# Patient Record
Sex: Female | Born: 1935 | Race: White | Hispanic: No | Smoking: Former smoker
Health system: Southern US, Community
[De-identification: ages and names within clinical notes are randomized; demographics above are authoritative.]

## PROBLEM LIST (undated history)

## (undated) DIAGNOSIS — Z9010 Acquired absence of unspecified breast and nipple: Secondary | ICD-10-CM

## (undated) DIAGNOSIS — H3530 Unspecified macular degeneration: Secondary | ICD-10-CM

## (undated) DIAGNOSIS — J45909 Unspecified asthma, uncomplicated: Secondary | ICD-10-CM

## (undated) DIAGNOSIS — N393 Stress incontinence (female) (male): Secondary | ICD-10-CM

## (undated) DIAGNOSIS — I272 Pulmonary hypertension, unspecified: Secondary | ICD-10-CM

## (undated) DIAGNOSIS — I509 Heart failure, unspecified: Secondary | ICD-10-CM

## (undated) DIAGNOSIS — M1990 Unspecified osteoarthritis, unspecified site: Secondary | ICD-10-CM

## (undated) DIAGNOSIS — Z803 Family history of malignant neoplasm of breast: Secondary | ICD-10-CM

## (undated) DIAGNOSIS — I10 Essential (primary) hypertension: Secondary | ICD-10-CM

## (undated) DIAGNOSIS — Z8701 Personal history of pneumonia (recurrent): Secondary | ICD-10-CM

## (undated) DIAGNOSIS — I700 Atherosclerosis of aorta: Secondary | ICD-10-CM

## (undated) DIAGNOSIS — N2889 Other specified disorders of kidney and ureter: Secondary | ICD-10-CM

## (undated) DIAGNOSIS — Z9089 Acquired absence of other organs: Secondary | ICD-10-CM

## (undated) DIAGNOSIS — C50919 Malignant neoplasm of unspecified site of unspecified female breast: Secondary | ICD-10-CM

## (undated) DIAGNOSIS — R0609 Other forms of dyspnea: Secondary | ICD-10-CM

## (undated) DIAGNOSIS — I2510 Atherosclerotic heart disease of native coronary artery without angina pectoris: Secondary | ICD-10-CM

## (undated) DIAGNOSIS — Z8619 Personal history of other infectious and parasitic diseases: Secondary | ICD-10-CM

## (undated) DIAGNOSIS — L570 Actinic keratosis: Secondary | ICD-10-CM

## (undated) DIAGNOSIS — J479 Bronchiectasis, uncomplicated: Secondary | ICD-10-CM

## (undated) DIAGNOSIS — K623 Rectal prolapse: Secondary | ICD-10-CM

## (undated) DIAGNOSIS — E039 Hypothyroidism, unspecified: Secondary | ICD-10-CM

## (undated) DIAGNOSIS — E890 Postprocedural hypothyroidism: Secondary | ICD-10-CM

## (undated) DIAGNOSIS — N3941 Urge incontinence: Secondary | ICD-10-CM

## (undated) DIAGNOSIS — Z853 Personal history of malignant neoplasm of breast: Secondary | ICD-10-CM

## (undated) DIAGNOSIS — I219 Acute myocardial infarction, unspecified: Secondary | ICD-10-CM

## (undated) DIAGNOSIS — J189 Pneumonia, unspecified organism: Secondary | ICD-10-CM

## (undated) DIAGNOSIS — Z7901 Long term (current) use of anticoagulants: Secondary | ICD-10-CM

## (undated) DIAGNOSIS — I517 Cardiomegaly: Secondary | ICD-10-CM

## (undated) DIAGNOSIS — Z8489 Family history of other specified conditions: Secondary | ICD-10-CM

## (undated) DIAGNOSIS — E785 Hyperlipidemia, unspecified: Secondary | ICD-10-CM

## (undated) HISTORY — PX: ABDOMINAL HYSTERECTOMY: SUR658

## (undated) HISTORY — PX: BREAST LUMPECTOMY: SHX2

## (undated) HISTORY — PX: CHOLECYSTECTOMY: SHX55

## (undated) HISTORY — PX: TONSILLECTOMY AND ADENOIDECTOMY: SHX28

## (undated) HISTORY — DX: Family history of malignant neoplasm of breast: Z80.3

## (undated) HISTORY — PX: CATARACT EXTRACTION, BILATERAL: SHX1313

## (undated) HISTORY — DX: Unspecified macular degeneration: H35.30

## (undated) HISTORY — DX: Essential (primary) hypertension: I10

## (undated) HISTORY — PX: TONSILLECTOMY: SUR1361

## (undated) HISTORY — PX: MASTECTOMY: SHX3

## (undated) HISTORY — DX: Personal history of malignant neoplasm of breast: Z85.3

## (undated) HISTORY — PX: REPLACEMENT TOTAL KNEE: SUR1224

## (undated) HISTORY — DX: Hypothyroidism, unspecified: E03.9

## (undated) HISTORY — PX: ABDOMINAL HYSTERECTOMY: SHX81

## (undated) HISTORY — PX: CARDIAC VALVE REPLACEMENT: SHX585

## (undated) HISTORY — DX: Personal history of pneumonia (recurrent): Z87.01

## (undated) HISTORY — PX: APPENDECTOMY: SHX54

---
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
## 2011-08-08 ENCOUNTER — Ambulatory Visit: Payer: Self-pay | Admitting: Internal Medicine

## 2012-05-18 ENCOUNTER — Emergency Department: Payer: Self-pay | Admitting: Internal Medicine

## 2014-01-19 DIAGNOSIS — D229 Melanocytic nevi, unspecified: Secondary | ICD-10-CM

## 2014-01-19 HISTORY — DX: Melanocytic nevi, unspecified: D22.9

## 2014-04-14 ENCOUNTER — Ambulatory Visit: Payer: Self-pay | Admitting: Ophthalmology

## 2014-06-09 ENCOUNTER — Encounter (INDEPENDENT_AMBULATORY_CARE_PROVIDER_SITE_OTHER): Payer: Medicare Other | Admitting: Ophthalmology

## 2014-06-09 DIAGNOSIS — H35039 Hypertensive retinopathy, unspecified eye: Secondary | ICD-10-CM

## 2014-06-09 DIAGNOSIS — H3530 Unspecified macular degeneration: Secondary | ICD-10-CM

## 2014-06-09 DIAGNOSIS — H35379 Puckering of macula, unspecified eye: Secondary | ICD-10-CM

## 2014-06-09 DIAGNOSIS — I10 Essential (primary) hypertension: Secondary | ICD-10-CM

## 2014-06-09 DIAGNOSIS — H43819 Vitreous degeneration, unspecified eye: Secondary | ICD-10-CM

## 2014-07-22 DIAGNOSIS — C4491 Basal cell carcinoma of skin, unspecified: Secondary | ICD-10-CM

## 2014-07-22 HISTORY — DX: Basal cell carcinoma of skin, unspecified: C44.91

## 2015-03-10 ENCOUNTER — Ambulatory Visit (INDEPENDENT_AMBULATORY_CARE_PROVIDER_SITE_OTHER): Payer: Medicare Other | Admitting: Ophthalmology

## 2015-03-10 DIAGNOSIS — H35033 Hypertensive retinopathy, bilateral: Secondary | ICD-10-CM

## 2015-03-10 DIAGNOSIS — I10 Essential (primary) hypertension: Secondary | ICD-10-CM

## 2015-03-10 DIAGNOSIS — H43813 Vitreous degeneration, bilateral: Secondary | ICD-10-CM | POA: Diagnosis not present

## 2015-03-10 DIAGNOSIS — H35372 Puckering of macula, left eye: Secondary | ICD-10-CM | POA: Diagnosis not present

## 2015-03-10 DIAGNOSIS — H3531 Nonexudative age-related macular degeneration: Secondary | ICD-10-CM | POA: Diagnosis not present

## 2015-08-10 ENCOUNTER — Ambulatory Visit (INDEPENDENT_AMBULATORY_CARE_PROVIDER_SITE_OTHER): Payer: Medicare Other | Admitting: Podiatry

## 2015-08-10 ENCOUNTER — Encounter: Payer: Self-pay | Admitting: Podiatry

## 2015-08-10 DIAGNOSIS — M778 Other enthesopathies, not elsewhere classified: Secondary | ICD-10-CM

## 2015-08-10 DIAGNOSIS — M7750 Other enthesopathy of unspecified foot: Secondary | ICD-10-CM | POA: Diagnosis not present

## 2015-08-10 DIAGNOSIS — L84 Corns and callosities: Secondary | ICD-10-CM

## 2015-08-10 DIAGNOSIS — M779 Enthesopathy, unspecified: Principal | ICD-10-CM

## 2015-08-10 NOTE — Progress Notes (Signed)
   Subjective:    [name], female    DOB: 1930/01/14, 81 y.o.   MRN: [id number]  HPI my 5th toe on my right is hurting and is touchy and hurts on my left 5th toe but not as bad and i have some white stuff on my toenails as well. She states that she keeps her nails polished most of the time. When she took the collar off this past time she noticed quite on top of the nails. She states that she has tried wider shoes for her corns.    Review of Systems     Objective:   Physical Exam: 81 year old female presents today in no apparent distress vital signs stable alert and oriented 3. Pulses are palpable. Neurologic sensorium is intact per Semmes-Weinstein monofilament. Deep tendon reflexes intact bilateral and muscle strength +5 over 5 dorsiflexion and plantar flexors and inverters everters all intrinsic musculature is intact. Orthopedic evaluation demonstrate all joints distal to the ankle range of motion without crepitation. Cutaneous evaluation demonstrates supple well-hydrated cutis no erythema edema saline as drainage or odor. She does have hammertoe deformities bilaterally not rigid in nature adductovarus rotated hammertoe deformity fifth bilateral with overlying bursitis and reactive hyperkeratosis. No complaints demonstrate normal nail plate with whitish discoloration associated with nail polish.        Assessment & Plan:  Assessment: Adductor varus rotated hammertoe deformity with capsulitis and bursitis.  Plan: I injected the area today with dexamethasone and local anesthetic and debrided all reactive hyperkeratosis. I discussed the nail discoloration with her she understands this and is amenable to it. I will follow up with her as needed.  Roselind Messier DPM

## 2016-03-13 ENCOUNTER — Ambulatory Visit (INDEPENDENT_AMBULATORY_CARE_PROVIDER_SITE_OTHER): Payer: Medicare Other | Admitting: Ophthalmology

## 2016-03-13 DIAGNOSIS — I10 Essential (primary) hypertension: Secondary | ICD-10-CM | POA: Diagnosis not present

## 2016-03-13 DIAGNOSIS — H35033 Hypertensive retinopathy, bilateral: Secondary | ICD-10-CM

## 2016-03-13 DIAGNOSIS — H353111 Nonexudative age-related macular degeneration, right eye, early dry stage: Secondary | ICD-10-CM

## 2016-03-13 DIAGNOSIS — H43813 Vitreous degeneration, bilateral: Secondary | ICD-10-CM

## 2016-03-13 DIAGNOSIS — H35372 Puckering of macula, left eye: Secondary | ICD-10-CM

## 2016-03-13 DIAGNOSIS — H353122 Nonexudative age-related macular degeneration, left eye, intermediate dry stage: Secondary | ICD-10-CM

## 2016-09-26 ENCOUNTER — Ambulatory Visit (INDEPENDENT_AMBULATORY_CARE_PROVIDER_SITE_OTHER): Payer: Medicare Other | Admitting: Podiatry

## 2016-09-26 DIAGNOSIS — L84 Corns and callosities: Secondary | ICD-10-CM

## 2016-09-26 DIAGNOSIS — M7750 Other enthesopathy of unspecified foot: Secondary | ICD-10-CM | POA: Diagnosis not present

## 2016-09-26 DIAGNOSIS — M779 Enthesopathy, unspecified: Secondary | ICD-10-CM

## 2016-09-26 DIAGNOSIS — M778 Other enthesopathies, not elsewhere classified: Secondary | ICD-10-CM

## 2016-09-27 NOTE — Progress Notes (Signed)
She presents today for follow-up of her painful corn fifth digit bilateral.  Objective: Vital signs are stable she is alert and oriented 3 she has capsulitis and bursitis to this fifth digit bilateral with overlying reactive hyperkeratosis. Pulses remain palpable.  Assessment: Capsulitis bursitis that digit bilateral overlying reactive hyperkeratosis.  Plan: Injected the area today with a smooth zone of local anesthetic debrided the cords bilaterally. We'll follow up with her in 3 months.

## 2017-03-14 ENCOUNTER — Ambulatory Visit (INDEPENDENT_AMBULATORY_CARE_PROVIDER_SITE_OTHER): Payer: Medicare Other | Admitting: Ophthalmology

## 2017-03-14 DIAGNOSIS — H35033 Hypertensive retinopathy, bilateral: Secondary | ICD-10-CM

## 2017-03-14 DIAGNOSIS — H26492 Other secondary cataract, left eye: Secondary | ICD-10-CM | POA: Diagnosis not present

## 2017-03-14 DIAGNOSIS — H35372 Puckering of macula, left eye: Secondary | ICD-10-CM | POA: Diagnosis not present

## 2017-03-14 DIAGNOSIS — I10 Essential (primary) hypertension: Secondary | ICD-10-CM | POA: Diagnosis not present

## 2017-03-14 DIAGNOSIS — H43813 Vitreous degeneration, bilateral: Secondary | ICD-10-CM | POA: Diagnosis not present

## 2017-03-21 ENCOUNTER — Ambulatory Visit (INDEPENDENT_AMBULATORY_CARE_PROVIDER_SITE_OTHER): Payer: Medicare Other | Admitting: Ophthalmology

## 2017-03-21 DIAGNOSIS — H2702 Aphakia, left eye: Secondary | ICD-10-CM

## 2017-07-08 DIAGNOSIS — C4492 Squamous cell carcinoma of skin, unspecified: Secondary | ICD-10-CM

## 2017-07-08 HISTORY — DX: Squamous cell carcinoma of skin, unspecified: C44.92

## 2017-08-14 ENCOUNTER — Telehealth: Payer: Self-pay | Admitting: *Deleted

## 2017-08-14 ENCOUNTER — Telehealth: Payer: Self-pay

## 2017-08-14 NOTE — Telephone Encounter (Signed)
Copied from [place] #[id number]. Topic: Appointment Scheduling - Prior Auth Required for Appointment >> Aug 14, 2017  8:17 AM Aurelio Brash B wrote: No appointment has been scheduled. Patient is requesting  new  pt apt appointment. Per scheduling protocol, this appointment requires a prior authorization prior to scheduling. Dr. Nehemiah Massed  recommended her to Dr Nicki Reaper  Route to department's [facility] pool.  >> Aug 14, 2017  3:30 PM Cleaster Corin, [place] wrote: Pt. Has called back trying to get in to see Dr. Nicki Reaper I let her know that someone will call her back when available [name]

## 2017-08-14 NOTE — Telephone Encounter (Signed)
Ok to schedule.

## 2017-08-14 NOTE — Telephone Encounter (Signed)
Patient is requesting to establish care with you. She says Dr. Nehemiah Massed recommended you  Copied from [place] [id number]. Topic: Appointment Scheduling - Prior Auth Required for Appointment >> Aug 14, 2017  8:17 AM Aurelio Brash B wrote: No appointment has been scheduled. Patient is requesting  new  pt apt appointment. Per scheduling protocol, this appointment requires a prior authorization prior to scheduling. Dr. Nehemiah Massed  recommended her to Dr Nicki Reaper  Route to department's [facility] pool.

## 2017-08-15 NOTE — Telephone Encounter (Signed)
Pt scheduled  

## 2017-08-15 NOTE — Telephone Encounter (Signed)
See other message regarding this phone note is open already.

## 2017-08-19 NOTE — Telephone Encounter (Signed)
Copied from [place] #[id number]. Topic: Appointment Scheduling - Prior Auth Required for Appointment >> Aug 14, 2017  8:17 AM Aurelio Brash B wrote: No appointment has been scheduled. Patient is requesting  new  pt apt appointment. Per scheduling protocol, this appointment requires a prior authorization prior to scheduling. Dr. Nehemiah Massed  recommended her to Dr Nicki Reaper  Route to department's [facility] pool.  >> Aug 14, 2017  3:30 PM Cleaster Corin, [place] wrote: Pt. Has called back trying to get in to see Dr. Nicki Reaper I let her know that someone will call her back when available [name]   >> Aug 19, 2017  1:45 PM Francella Solian, CMA wrote: App has been made patient informed.

## 2017-11-28 ENCOUNTER — Ambulatory Visit (INDEPENDENT_AMBULATORY_CARE_PROVIDER_SITE_OTHER): Payer: Medicare Other | Admitting: Internal Medicine

## 2017-11-28 DIAGNOSIS — H3530 Unspecified macular degeneration: Secondary | ICD-10-CM

## 2017-11-28 DIAGNOSIS — E7800 Pure hypercholesterolemia, unspecified: Secondary | ICD-10-CM

## 2017-11-28 DIAGNOSIS — E039 Hypothyroidism, unspecified: Secondary | ICD-10-CM

## 2017-11-28 DIAGNOSIS — I10 Essential (primary) hypertension: Secondary | ICD-10-CM | POA: Insufficient documentation

## 2017-11-28 DIAGNOSIS — Z853 Personal history of malignant neoplasm of breast: Secondary | ICD-10-CM | POA: Diagnosis not present

## 2017-11-28 DIAGNOSIS — E785 Hyperlipidemia, unspecified: Secondary | ICD-10-CM | POA: Insufficient documentation

## 2017-11-28 LAB — LIPID PANEL
CHOL/HDL RATIO: 3
Cholesterol: 199 mg/dL (ref 0–200)
HDL: 67.8 mg/dL (ref >39.00)
LDL Cholesterol: 117 mg/dL — ABNORMAL HIGH (ref 0–99)
NONHDL: 131.32
Triglycerides: 70 mg/dL (ref 0.0–149.0)
VLDL: 14 mg/dL (ref 0.0–40.0)

## 2017-11-28 LAB — HEPATIC FUNCTION PANEL
ALK PHOS: 63 U/L (ref 39–117)
ALT: 16 U/L (ref 0–35)
AST: 28 U/L (ref 0–37)
Albumin: 4 g/dL (ref 3.5–5.2)
BILIRUBIN DIRECT: 0.1 mg/dL (ref 0.0–0.3)
Total Bilirubin: 0.7 mg/dL (ref 0.2–1.2)
Total Protein: 7 g/dL (ref 6.0–8.3)

## 2017-11-28 LAB — TSH: TSH: 2.65 u[IU]/mL (ref 0.35–4.50)

## 2017-11-28 LAB — BASIC METABOLIC PANEL
BUN: 25 mg/dL — AB (ref 6–23)
CALCIUM: 9.7 mg/dL (ref 8.4–10.5)
CO2: 34 mEq/L — ABNORMAL HIGH (ref 19–32)
CREATININE: 0.99 mg/dL (ref 0.40–1.20)
Chloride: 101 mEq/L (ref 96–112)
GFR: 56.51 mL/min — AB (ref >60.00)
Glucose, Bld: 92 mg/dL (ref 70–99)
POTASSIUM: 3.9 meq/L (ref 3.5–5.1)
Sodium: 142 mEq/L (ref 135–145)

## 2017-11-28 NOTE — Progress Notes (Signed)
[name], female   DOB: 04-25-1930, 84 y.o.   MRN: [id number]   Subjective:    [name], female    DOB: [birth year], 84 y.o.   MRN: [id number]  HPI  Patient here to establish care.  Former pt of Dr Alexa Pham. He retired.  She reports she is doing well.  States she is married and has been for 37 years.  Originally from [place].  Has lived in multiple places.  Has been her for 6 years.  Stays active.  Walks 2-3 miles several times per week.  Used to play tennis.  No chest pain.  No sob.  No acid reflux.  No abdominal pain.  Bowels moving.  Eats bran and this keeps her regular.  Is s/p lumpectomy for breast cancer.  Did not require chemo.  Declines further mammograms.  Followed by [facility].  Has macular degeneration.  On synthroid.  Just had her dose adjusted in 05/2018.  Has not had labs since.     Past Medical History:  Diagnosis Date  . History of breast cancer   . Hypertension   . Hypothyroidism   . Macular degeneration       Social History   Socioeconomic History  . Marital status: Married    Spouse name: None  . Number of children: None  . Years of education: None  . Highest education level: None  Social Needs  . Financial resource strain: None  . Food insecurity - worry: None  . Food insecurity - inability: None  . Transportation needs - medical: None  . Transportation needs - non-medical: None  Occupational History  . None  Tobacco Use  . Smoking status: Never Smoker  . Smokeless tobacco: Never Used  Substance and Sexual Activity  . Alcohol use: No    Alcohol/week: 0.0 oz  . Drug use: No  . Sexual activity: None  Other Topics Concern  . None  Social History Narrative  . None    Outpatient Encounter Medications as of 11/28/2017  Medication Sig  . amLODipine (NORVASC) 10 MG tablet Take 10 mg by mouth daily.  . calcium citrate-vitamin D (CITRACAL+D) 315-200 MG-UNIT tablet Take 1 tablet by mouth 2 (two) times daily.  Marland Kitchen  levothyroxine (SYNTHROID, LEVOTHROID) 112 MCG tablet   . metoprolol succinate (TOPROL-XL) 50 MG 24 hr tablet Take 50 mg by mouth daily. Take with or immediately following a meal.  . mirabegron ER (MYRBETRIQ) 25 MG TB24 tablet Take 25 mg by mouth daily.  . Multiple Vitamin (MULTIVITAMIN) capsule Take 1 capsule by mouth daily.  . Multiple Vitamins-Minerals (PRESERVISION AREDS 2 PO) Take by mouth.  . [DISCONTINUED] aspirin 81 MG tablet Take 81 mg by mouth daily.  . [DISCONTINUED] Glucosamine-Chondroitin (OSTEO BI-FLEX REGULAR STRENGTH PO) Take by mouth.  . [DISCONTINUED] levothyroxine (SYNTHROID, LEVOTHROID) 100 MCG tablet Take 100 mcg by mouth daily before breakfast.   No facility-administered encounter medications on file as of 11/28/2017.     Review of Systems  Constitutional: Negative for appetite change and unexpected weight change.  HENT: Negative for congestion and sinus pressure.   Respiratory: Negative for cough, chest tightness and shortness of breath.   Cardiovascular: Negative for chest pain, palpitations and leg swelling.  Gastrointestinal: Negative for abdominal pain, diarrhea, nausea and vomiting.  Genitourinary: Negative for dysuria.  Musculoskeletal: Negative for joint swelling and myalgias.  Skin: Negative for color change and rash.  Neurological: Negative for dizziness, light-headedness and headaches.  Psychiatric/Behavioral: Negative for  agitation and dysphoric mood.       Objective:    Physical Exam  Constitutional: She appears well-developed and well-nourished. No distress.  HENT:  Nose: Nose normal.  Mouth/Throat: Oropharynx is clear and moist.  Neck: Neck supple. No thyromegaly present.  Cardiovascular: Normal rate and regular rhythm.  Pulmonary/Chest: Breath sounds normal. No respiratory distress. She has no wheezes.  Abdominal: Soft. Bowel sounds are normal. There is no tenderness.  Musculoskeletal: She exhibits no edema or tenderness.  Lymphadenopathy:     She has no cervical adenopathy.  Skin: No rash noted. No erythema.  Psychiatric: She has a normal mood and affect. Her behavior is normal.    BP 128/70 (BP Location: Left Arm, Patient Position: Sitting, Cuff Size: Normal)   Pulse 62   Temp 98.3 F (36.8 C) (Oral)   Resp 18   Wt 115 lb 3.2 oz (52.3 kg)   SpO2 97%  Wt Readings from Last 3 Encounters:  11/28/17 115 lb 3.2 oz (52.3 kg)       Assessment & Plan:   Problem List Items Addressed This Visit    History of breast cancer    S/p lumpectomy.  Did not require chemo.  Declines mammogram.        Hypercholesterolemia    Follow lipid panel.  Has never been on cholesterol medication.       Relevant Orders   Hepatic function panel (Completed)   Lipid panel (Completed)   Hypertension    Blood pressure as outlined.  Increased on my initial check, but improved on recheck.  Same medication regimen.  Follow pressures.  Check metabolic panel.        Relevant Orders   Basic metabolic panel (Completed)   Hypothyroidism    On thyroid replacement.  Dose just changed 05/2017.  Check tsh.        Relevant Medications   levothyroxine (SYNTHROID, LEVOTHROID) 112 MCG tablet   Other Relevant Orders   TSH (Completed)   Macular degeneration    Followed by ophthalmology.           I spent 45 minutes with the patient and more than 50% of the time was spent in consultation regarding the above.  Time spent obtaining her history and discussing current issues.  Time also spent discussed treatment and further testing.    Einar Pheasant, MD

## 2017-12-01 ENCOUNTER — Encounter: Payer: Self-pay | Admitting: Internal Medicine

## 2017-12-01 DIAGNOSIS — H3530 Unspecified macular degeneration: Secondary | ICD-10-CM | POA: Insufficient documentation

## 2017-12-01 DIAGNOSIS — Z853 Personal history of malignant neoplasm of breast: Secondary | ICD-10-CM | POA: Insufficient documentation

## 2017-12-01 NOTE — Assessment & Plan Note (Signed)
S/p lumpectomy.  Did not require chemo.  Declines mammogram.

## 2017-12-01 NOTE — Assessment & Plan Note (Signed)
Followed by ophthalmology.   

## 2017-12-01 NOTE — Assessment & Plan Note (Signed)
Follow lipid panel.  Has never been on cholesterol medication.

## 2017-12-01 NOTE — Assessment & Plan Note (Signed)
On thyroid replacement.  Dose just changed 05/2017.  Check tsh.

## 2017-12-01 NOTE — Assessment & Plan Note (Signed)
Blood pressure as outlined.  Increased on my initial check, but improved on recheck.  Same medication regimen.  Follow pressures.  Check metabolic panel.

## 2017-12-31 ENCOUNTER — Encounter: Payer: Self-pay | Admitting: *Deleted

## 2018-02-06 ENCOUNTER — Other Ambulatory Visit: Payer: Self-pay

## 2018-02-06 ENCOUNTER — Telehealth: Payer: Self-pay

## 2018-02-06 MED ORDER — METOPROLOL SUCCINATE ER 50 MG PO TB24: 50.0000 mg | ORAL_TABLET | Freq: Every day | ORAL | 0 refills | Status: DC

## 2018-02-06 MED ORDER — LEVOTHYROXINE SODIUM 112 MCG PO TABS: 112.0000 ug | ORAL_TABLET | Freq: Every day | ORAL | 1 refills | Status: DC

## 2018-02-06 MED ORDER — AMLODIPINE BESYLATE 10 MG PO TABS: 10.0000 mg | ORAL_TABLET | Freq: Every day | ORAL | 1 refills | Status: DC

## 2018-02-06 NOTE — Telephone Encounter (Signed)
Copied from [place] [id number]. Topic: Quick Communication - Rx Refill/Question >> Feb 06, 2018  1:14 PM Carolyn Stare wrote: Medication  amLODipine (NORVASC) 10 MG tablet    levothyroxine (SYNTHROID, LEVOTHROID) 112 MCG tablet    [place] 50mg    Pt is a new pt of Dr Nicki Reaper and is requesting refills on the above meds    Preferred Pharmacy   [facility] at   [place]: Please be advised that RX refills may take up to 3 business days. We ask that you follow-up with your pharmacy.

## 2018-02-06 NOTE — Telephone Encounter (Signed)
Pt aware rx sent in. 

## 2018-02-06 NOTE — Telephone Encounter (Signed)
This is a new patient of yours. OK to send in these refills for 90 days ?

## 2018-02-06 NOTE — Telephone Encounter (Signed)
ok 

## 2018-03-06 ENCOUNTER — Encounter: Payer: Self-pay | Admitting: Internal Medicine

## 2018-03-06 ENCOUNTER — Ambulatory Visit (INDEPENDENT_AMBULATORY_CARE_PROVIDER_SITE_OTHER): Payer: Medicare Other | Admitting: Internal Medicine

## 2018-03-06 DIAGNOSIS — Z853 Personal history of malignant neoplasm of breast: Secondary | ICD-10-CM

## 2018-03-06 DIAGNOSIS — E039 Hypothyroidism, unspecified: Secondary | ICD-10-CM | POA: Diagnosis not present

## 2018-03-06 DIAGNOSIS — I10 Essential (primary) hypertension: Secondary | ICD-10-CM | POA: Diagnosis not present

## 2018-03-06 DIAGNOSIS — E7800 Pure hypercholesterolemia, unspecified: Secondary | ICD-10-CM | POA: Diagnosis not present

## 2018-03-06 NOTE — Progress Notes (Signed)
[name], female   DOB: 1929-12-16, 84 y.o.   MRN: [id number]   Subjective:    [name], female    DOB: 1930/08/01, 84 y.o.   MRN: [id number]  HPI  Patient here for a scheduled follow up.  Sees Dr Jacqlyn Larsen.  Stable on myrbetriq.  New pt to me last visit.  Has been doing well.  Stays active.  No chest pain.  No sob.  No acid reflux.  Good appetite.  Bowels stable.  Overall feels good.  Has been monitoring her blood pressure.  Overall ok.  Outside checks reviewed.     Past Medical History:  Diagnosis Date  . History of breast cancer   . Hypertension   . Hypothyroidism   . Macular degeneration    Past Surgical History:  Procedure Laterality Date  . ABDOMINAL HYSTERECTOMY    . APPENDECTOMY    . BREAST LUMPECTOMY    . CATARACT EXTRACTION, BILATERAL    . CHOLECYSTECTOMY    . REPLACEMENT TOTAL KNEE    . TONSILLECTOMY AND ADENOIDECTOMY     Family History  Problem Relation Age of Onset  . CAD Mother    Social History   Socioeconomic History  . Marital status: Married    Spouse name: Not on file  . Number of children: Not on file  . Years of education: Not on file  . Highest education level: Not on file  Occupational History  . Not on file  Social Needs  . Financial resource strain: Not on file  . Food insecurity:    Worry: Not on file    Inability: Not on file  . Transportation needs:    Medical: Not on file    Non-medical: Not on file  Tobacco Use  . Smoking status: Never Smoker  . Smokeless tobacco: Never Used  Substance and Sexual Activity  . Alcohol use: No    Alcohol/week: 0.0 oz  . Drug use: No  . Sexual activity: Not on file  Lifestyle  . Physical activity:    Days per week: Not on file    Minutes per session: Not on file  . Stress: Not on file  Relationships  . Social connections:    Talks on phone: Not on file    Gets together: Not on file    Attends religious service: Not on file    Active member of club or organization:  Not on file    Attends meetings of clubs or organizations: Not on file    Relationship status: Not on file  Other Topics Concern  . Not on file  Social History Narrative  . Not on file    Outpatient Encounter Medications as of 03/06/2018  Medication Sig  . amLODipine (NORVASC) 10 MG tablet Take 1 tablet (10 mg total) by mouth daily.  . calcium citrate-vitamin D (CITRACAL+D) 315-200 MG-UNIT tablet Take 1 tablet by mouth 2 (two) times daily.  Marland Kitchen levothyroxine (SYNTHROID, LEVOTHROID) 112 MCG tablet Take 1 tablet (112 mcg total) by mouth daily before breakfast.  . metoprolol succinate (TOPROL-XL) 50 MG 24 hr tablet Take 1 tablet (50 mg total) by mouth daily. Take with or immediately following a meal.  . mirabegron ER (MYRBETRIQ) 25 MG TB24 tablet Take 25 mg by mouth daily.  . Multiple Vitamin (MULTIVITAMIN) capsule Take 1 capsule by mouth daily.  . Multiple Vitamins-Minerals (PRESERVISION AREDS 2 PO) Take by mouth.   No facility-administered encounter medications on file as of 03/06/2018.  Review of Systems  Constitutional: Negative for appetite change and unexpected weight change.  HENT: Negative for congestion and sinus pressure.   Respiratory: Negative for cough, chest tightness and shortness of breath.   Cardiovascular: Negative for chest pain, palpitations and leg swelling.  Gastrointestinal: Negative for abdominal pain, diarrhea, nausea and vomiting.  Genitourinary: Negative for difficulty urinating and dysuria.  Musculoskeletal: Negative for joint swelling and myalgias.  Skin: Negative for color change and rash.  Neurological: Negative for dizziness, light-headedness and headaches.  Psychiatric/Behavioral: Negative for agitation and dysphoric mood.       Objective:     Blood pressure rechecked by me:  136/72  Physical Exam  Constitutional: She appears well-developed and well-nourished. No distress.  HENT:  Nose: Nose normal.  Mouth/Throat: Oropharynx is clear and moist.   Neck: Neck supple. No thyromegaly present.  Cardiovascular: Normal rate and regular rhythm.  Pulmonary/Chest: Breath sounds normal. No respiratory distress. She has no wheezes.  Abdominal: Soft. Bowel sounds are normal. There is no tenderness.  Musculoskeletal: She exhibits no edema or tenderness.  Lymphadenopathy:    She has no cervical adenopathy.  Skin: No rash noted. No erythema.  Psychiatric: She has a normal mood and affect. Her behavior is normal.    BP 130/70 (BP Location: Left Arm, Patient Position: Sitting, Cuff Size: Normal)   Pulse 69   Temp 98.2 F (36.8 C) (Oral)   Resp 16   Wt 114 lb 3.2 oz (51.8 kg)   SpO2 97%  Wt Readings from Last 3 Encounters:  03/06/18 114 lb 3.2 oz (51.8 kg)  11/28/17 115 lb 3.2 oz (52.3 kg)     Lab Results  Component Value Date   GLUCOSE 92 11/28/2017   CHOL 199 11/28/2017   TRIG 70.0 11/28/2017   HDL 67.80 11/28/2017   LDLCALC 117 (H) 11/28/2017   ALT 16 11/28/2017   AST 28 11/28/2017   NA 142 11/28/2017   K 3.9 11/28/2017   CL 101 11/28/2017   CREATININE 0.99 11/28/2017   BUN 25 (H) 11/28/2017   CO2 34 (H) 11/28/2017   TSH 2.65 11/28/2017       Assessment & Plan:   Problem List Items Addressed This Visit    History of breast cancer    S/p lumpectomy.  Did not require chemo.  Declines mammogram.  Discussed with her again today.       Hypercholesterolemia    Has never been on cholesterol medication and desires not to start.  Follow lipid panel.  She exercises regularly.  Monitors diet.        Relevant Orders   Hepatic function panel   Lipid panel   Hypertension    Outside blood pressure checks reviewed and overall ok.  Blood pressure here ok.  Continue current medication regimen.  Follow pressures.  Follow metabolic panel.       Relevant Orders   CBC with Differential/Platelet   Basic metabolic panel   Hypothyroidism    On thyroid replacement.  Follow tsh.            Einar Pheasant, MD

## 2018-03-06 NOTE — Patient Instructions (Signed)
Dr John Giovanni - (phone [phone])

## 2018-03-09 ENCOUNTER — Encounter: Payer: Self-pay | Admitting: Internal Medicine

## 2018-03-09 NOTE — Assessment & Plan Note (Signed)
Outside blood pressure checks reviewed and overall ok.  Blood pressure here ok.  Continue current medication regimen.  Follow pressures.  Follow metabolic panel.

## 2018-03-09 NOTE — Assessment & Plan Note (Signed)
S/p lumpectomy.  Did not require chemo.  Declines mammogram.  Discussed with her again today.

## 2018-03-09 NOTE — Assessment & Plan Note (Signed)
Has never been on cholesterol medication and desires not to start.  Follow lipid panel.  She exercises regularly.  Monitors diet.

## 2018-03-09 NOTE — Assessment & Plan Note (Signed)
On thyroid replacement.  Follow tsh.  

## 2018-03-26 ENCOUNTER — Ambulatory Visit (INDEPENDENT_AMBULATORY_CARE_PROVIDER_SITE_OTHER): Payer: Medicare Other | Admitting: Ophthalmology

## 2018-03-26 DIAGNOSIS — I10 Essential (primary) hypertension: Secondary | ICD-10-CM | POA: Diagnosis not present

## 2018-03-26 DIAGNOSIS — H35372 Puckering of macula, left eye: Secondary | ICD-10-CM | POA: Diagnosis not present

## 2018-03-26 DIAGNOSIS — H43813 Vitreous degeneration, bilateral: Secondary | ICD-10-CM | POA: Diagnosis not present

## 2018-03-26 DIAGNOSIS — H353121 Nonexudative age-related macular degeneration, left eye, early dry stage: Secondary | ICD-10-CM

## 2018-03-26 DIAGNOSIS — H35033 Hypertensive retinopathy, bilateral: Secondary | ICD-10-CM

## 2018-06-25 ENCOUNTER — Other Ambulatory Visit: Payer: Self-pay | Admitting: Internal Medicine

## 2018-06-27 ENCOUNTER — Other Ambulatory Visit (INDEPENDENT_AMBULATORY_CARE_PROVIDER_SITE_OTHER): Payer: Medicare Other

## 2018-06-27 DIAGNOSIS — I10 Essential (primary) hypertension: Secondary | ICD-10-CM

## 2018-06-27 DIAGNOSIS — E7800 Pure hypercholesterolemia, unspecified: Secondary | ICD-10-CM

## 2018-06-27 LAB — BASIC METABOLIC PANEL
BUN: 19 mg/dL (ref 6–23)
CHLORIDE: 102 meq/L (ref 96–112)
CO2: 35 mEq/L — ABNORMAL HIGH (ref 19–32)
Calcium: 9.4 mg/dL (ref 8.4–10.5)
Creatinine, Ser: 0.9 mg/dL (ref 0.40–1.20)
GFR: 63 mL/min (ref >60.00)
Glucose, Bld: 95 mg/dL (ref 70–99)
POTASSIUM: 4 meq/L (ref 3.5–5.1)
SODIUM: 143 meq/L (ref 135–145)

## 2018-06-27 LAB — CBC WITH DIFFERENTIAL/PLATELET
BASOS ABS: 0 10*3/uL (ref 0.0–0.1)
Basophils Relative: 0.7 % (ref 0.0–3.0)
EOS ABS: 0.1 10*3/uL (ref 0.0–0.7)
Eosinophils Relative: 1.9 % (ref 0.0–5.0)
HEMATOCRIT: 41.3 % (ref 36.0–46.0)
Hemoglobin: 13.9 g/dL (ref 12.0–15.0)
LYMPHS PCT: 26.6 % (ref 12.0–46.0)
Lymphs Abs: 1.4 10*3/uL (ref 0.7–4.0)
MCHC: 33.6 g/dL (ref 30.0–36.0)
MCV: 94.4 fl (ref 78.0–100.0)
MONO ABS: 0.6 10*3/uL (ref 0.1–1.0)
Monocytes Relative: 10.3 % (ref 3.0–12.0)
Neutro Abs: 3.2 10*3/uL (ref 1.4–7.7)
Neutrophils Relative %: 60.5 % (ref 43.0–77.0)
Platelets: 221 10*3/uL (ref 150.0–400.0)
RBC: 4.37 Mil/uL (ref 3.87–5.11)
RDW: 14.6 % (ref 11.5–15.5)
WBC: 5.3 10*3/uL (ref 4.0–10.5)

## 2018-06-27 LAB — LIPID PANEL
Cholesterol: 180 mg/dL (ref 0–200)
HDL: 66.5 mg/dL (ref >39.00)
LDL CALC: 99 mg/dL (ref 0–99)
NonHDL: 113.57
TRIGLYCERIDES: 74 mg/dL (ref 0.0–149.0)
Total CHOL/HDL Ratio: 3
VLDL: 14.8 mg/dL (ref 0.0–40.0)

## 2018-06-27 LAB — HEPATIC FUNCTION PANEL
ALBUMIN: 3.9 g/dL (ref 3.5–5.2)
ALT: 21 U/L (ref 0–35)
AST: 33 U/L (ref 0–37)
Alkaline Phosphatase: 65 U/L (ref 39–117)
BILIRUBIN TOTAL: 0.8 mg/dL (ref 0.2–1.2)
Bilirubin, Direct: 0.2 mg/dL (ref 0.0–0.3)
TOTAL PROTEIN: 6.8 g/dL (ref 6.0–8.3)

## 2018-07-01 ENCOUNTER — Ambulatory Visit (INDEPENDENT_AMBULATORY_CARE_PROVIDER_SITE_OTHER): Payer: Medicare Other | Admitting: Internal Medicine

## 2018-07-01 ENCOUNTER — Encounter: Payer: Self-pay | Admitting: Internal Medicine

## 2018-07-01 VITALS — BP 130/68 | HR 72 | Temp 98.2°F | Resp 18 | Wt 110.8 lb

## 2018-07-01 DIAGNOSIS — I10 Essential (primary) hypertension: Secondary | ICD-10-CM

## 2018-07-01 DIAGNOSIS — E7800 Pure hypercholesterolemia, unspecified: Secondary | ICD-10-CM

## 2018-07-01 DIAGNOSIS — E2839 Other primary ovarian failure: Secondary | ICD-10-CM | POA: Diagnosis not present

## 2018-07-01 DIAGNOSIS — Z0000 Encounter for general adult medical examination without abnormal findings: Secondary | ICD-10-CM

## 2018-07-01 DIAGNOSIS — Z853 Personal history of malignant neoplasm of breast: Secondary | ICD-10-CM | POA: Diagnosis not present

## 2018-07-01 DIAGNOSIS — R0989 Other specified symptoms and signs involving the circulatory and respiratory systems: Secondary | ICD-10-CM

## 2018-07-01 DIAGNOSIS — E039 Hypothyroidism, unspecified: Secondary | ICD-10-CM

## 2018-07-01 NOTE — Patient Instructions (Signed)
Dr John Giovanni - now in [facility]  (urology)

## 2018-07-01 NOTE — Progress Notes (Signed)
[name], female   DOB: [birth year], 84 y.o.   MRN: [id number]   Subjective:    [name], female    DOB: June 06, 1930, 84 y.o.   MRN: [id number]  HPI  Patient with past history of hypertension and hypothyroidism.  She comes in today to follow up on these issues as well as for a complete physical exam.  She reports she is doing well.  Feels good.  Stays active.  Exercises regularly.  No chest pain.  No sob.  No acid reflux.  No abdominal pain.  Bowels moving.  Discussed bone density.  Will schedule.  Was previously having some problems with constipation, but this resolved with dulcolax.     Past Medical History:  Diagnosis Date  . History of breast cancer   . Hypertension   . Hypothyroidism   . Macular degeneration    Past Surgical History:  Procedure Laterality Date  . ABDOMINAL HYSTERECTOMY    . APPENDECTOMY    . BREAST LUMPECTOMY    . CATARACT EXTRACTION, BILATERAL    . CHOLECYSTECTOMY    . REPLACEMENT TOTAL KNEE    . TONSILLECTOMY AND ADENOIDECTOMY     Family History  Problem Relation Age of Onset  . CAD Mother    Social History   Socioeconomic History  . Marital status: Married    Spouse name: Not on file  . Number of children: Not on file  . Years of education: Not on file  . Highest education level: Not on file  Occupational History  . Not on file  Social Needs  . Financial resource strain: Not on file  . Food insecurity:    Worry: Not on file    Inability: Not on file  . Transportation needs:    Medical: Not on file    Non-medical: Not on file  Tobacco Use  . Smoking status: Never Smoker  . Smokeless tobacco: Never Used  Substance and Sexual Activity  . Alcohol use: No    Alcohol/week: 0.0 standard drinks  . Drug use: No  . Sexual activity: Not on file  Lifestyle  . Physical activity:    Days per week: Not on file    Minutes per session: Not on file  . Stress: Not on file  Relationships  . Social connections:    Talks  on phone: Not on file    Gets together: Not on file    Attends religious service: Not on file    Active member of club or organization: Not on file    Attends meetings of clubs or organizations: Not on file    Relationship status: Not on file  Other Topics Concern  . Not on file  Social History Narrative  . Not on file    Outpatient Encounter Medications as of 07/01/2018  Medication Sig  . amLODipine (NORVASC) 10 MG tablet Take 1 tablet (10 mg total) by mouth daily.  . calcium citrate-vitamin D (CITRACAL+D) 315-200 MG-UNIT tablet Take 1 tablet by mouth 2 (two) times daily.  Marland Kitchen levothyroxine (SYNTHROID, LEVOTHROID) 112 MCG tablet Take 1 tablet (112 mcg total) by mouth daily before breakfast.  . metoprolol succinate (TOPROL-XL) 50 MG 24 hr tablet TAKE 1 TABLET BY MOUTH DAILY WITH OR IMMEDIATELY FOLLOWING A MEAL  . mirabegron ER (MYRBETRIQ) 25 MG TB24 tablet Take 25 mg by mouth daily.  . Multiple Vitamin (MULTIVITAMIN) capsule Take 1 capsule by mouth daily.  . Multiple Vitamins-Minerals (PRESERVISION AREDS 2 PO) Take by mouth.  No facility-administered encounter medications on file as of 07/01/2018.     Review of Systems  Constitutional: Negative for appetite change.       Decreased weight.  She has been adjusting her diet some and cutting back.  Discussed that she did not need to lose weight.    HENT: Negative for congestion and sinus pressure.   Eyes: Negative for pain and visual disturbance.  Respiratory: Negative for cough, chest tightness and shortness of breath.   Cardiovascular: Negative for chest pain, palpitations and leg swelling.  Gastrointestinal: Negative for abdominal pain, diarrhea, nausea and vomiting.  Genitourinary: Negative for difficulty urinating and dysuria.  Musculoskeletal: Negative for joint swelling and myalgias.  Skin: Negative for color change and rash.  Neurological: Negative for dizziness, light-headedness and headaches.  Hematological: Negative for  adenopathy. Does not bruise/bleed easily.  Psychiatric/Behavioral: Negative for agitation and dysphoric mood.       Objective:    Physical Exam  Constitutional: She appears well-developed and well-nourished. No distress.  HENT:  Nose: Nose normal.  Mouth/Throat: Oropharynx is clear and moist.  Eyes: Right eye exhibits no discharge. Left eye exhibits no discharge.  Neck: Neck supple. No thyromegaly present.  Cardiovascular: Normal rate and regular rhythm.  Pulmonary/Chest: Breath sounds normal. No respiratory distress. She has no wheezes.  Abdominal: Soft. Bowel sounds are normal. There is no tenderness.  Musculoskeletal: She exhibits no edema or tenderness.  Lymphadenopathy:    She has no cervical adenopathy.  Skin: No rash noted. No erythema.  Psychiatric: She has a normal mood and affect. Her behavior is normal.    BP 130/68 (BP Location: Left Arm, Patient Position: Sitting, Cuff Size: Normal)   Pulse 72   Temp 98.2 F (36.8 C) (Oral)   Resp 18   Wt 110 lb 12.8 oz (50.3 kg)   SpO2 99%  Wt Readings from Last 3 Encounters:  07/01/18 110 lb 12.8 oz (50.3 kg)  03/06/18 114 lb 3.2 oz (51.8 kg)  11/28/17 115 lb 3.2 oz (52.3 kg)     Lab Results  Component Value Date   WBC 5.3 06/27/2018   HGB 13.9 06/27/2018   HCT 41.3 06/27/2018   PLT 221.0 06/27/2018   GLUCOSE 95 06/27/2018   CHOL 180 06/27/2018   TRIG 74.0 06/27/2018   HDL 66.50 06/27/2018   LDLCALC 99 06/27/2018   ALT 21 06/27/2018   AST 33 06/27/2018   NA 143 06/27/2018   K 4.0 06/27/2018   CL 102 06/27/2018   CREATININE 0.90 06/27/2018   BUN 19 06/27/2018   CO2 35 (H) 06/27/2018   TSH 2.65 11/28/2017       Assessment & Plan:   Problem List Items Addressed This Visit    Health care maintenance    Physical today 07/01/18.  Declines mammogram.  Schedule bone density.        History of breast cancer    S/p lumpectomy.  Did not require chemo.  Declines further mammograms.        Hypercholesterolemia      Declines cholesterol medication.  Has never taken and desires not to take.  Follow lipid panel.        Hypertension    Blood pressure under good control.  Continue same medication regimen.  Follow pressures.  Follow metabolic panel.        Hypothyroidism    On thyroid replacement.  Follow tsh.         Other Visit Diagnoses    Estrogen deficiency    -  Primary   Relevant Orders   DG Bone Density   Abdominal bruit       Relevant Orders   US Aorta       Einar Pheasant, MD

## 2018-07-04 ENCOUNTER — Telehealth: Payer: Self-pay

## 2018-07-04 NOTE — Telephone Encounter (Signed)
Ultrasound ordered.  Someone should be contacting her with appt date and time.  Let us know if she does not hear.

## 2018-07-04 NOTE — Telephone Encounter (Signed)
Pt was just seen this week, I do not see the ultrasound order. I instructed the patient while she was in the office that it could take up to two weeks for someone to reach out to her.

## 2018-07-04 NOTE — Telephone Encounter (Signed)
Left message for patient letting her know order has been placed and someone should be contacting her

## 2018-07-04 NOTE — Telephone Encounter (Signed)
Copied from [place] [id number]. Topic: General - Other >> Jul 04, 2018 12:14 PM Alexa Pham, Bary Castilla R wrote: Patient called in and stated she was told that she was going to get an abdominal ultrasound but hasnt received information on instructions

## 2018-07-06 ENCOUNTER — Encounter: Payer: Self-pay | Admitting: Internal Medicine

## 2018-07-06 DIAGNOSIS — Z0000 Encounter for general adult medical examination without abnormal findings: Secondary | ICD-10-CM | POA: Insufficient documentation

## 2018-07-06 NOTE — Assessment & Plan Note (Signed)
Physical today 07/01/18.  Declines mammogram.  Schedule bone density.

## 2018-07-06 NOTE — Assessment & Plan Note (Signed)
Declines cholesterol medication.  Has never taken and desires not to take.  Follow lipid panel.

## 2018-07-06 NOTE — Assessment & Plan Note (Signed)
Blood pressure under good control.  Continue same medication regimen.  Follow pressures.  Follow metabolic panel.   

## 2018-07-06 NOTE — Assessment & Plan Note (Signed)
S/p lumpectomy.  Did not require chemo.  Declines further mammograms.

## 2018-07-06 NOTE — Assessment & Plan Note (Signed)
On thyroid replacement.  Follow tsh.  

## 2018-08-08 ENCOUNTER — Other Ambulatory Visit: Payer: Self-pay | Admitting: Internal Medicine

## 2018-08-12 ENCOUNTER — Encounter: Payer: Self-pay | Admitting: Internal Medicine

## 2018-08-18 ENCOUNTER — Telehealth: Payer: Self-pay | Admitting: Internal Medicine

## 2018-08-18 NOTE — Telephone Encounter (Signed)
It looks like you lvm for the husband in Sept.

## 2018-08-18 NOTE — Telephone Encounter (Signed)
Patient  And US Aorta ordered 07/04/18, patient would like to know has this been scheduled?

## 2018-08-21 ENCOUNTER — Ambulatory Visit
Admission: RE | Admit: 2018-08-21 | Discharge: 2018-08-21 | Disposition: A | Discharge status: Home / Self Care | Payer: Medicare Other | Source: Ambulatory Visit | Attending: Internal Medicine | Admitting: Internal Medicine

## 2018-08-21 DIAGNOSIS — E2839 Other primary ovarian failure: Secondary | ICD-10-CM | POA: Insufficient documentation

## 2018-08-22 ENCOUNTER — Encounter: Payer: Self-pay | Admitting: *Deleted

## 2018-09-23 ENCOUNTER — Other Ambulatory Visit: Payer: Self-pay | Admitting: Internal Medicine

## 2018-09-26 ENCOUNTER — Other Ambulatory Visit: Payer: Self-pay | Admitting: Internal Medicine

## 2018-09-26 ENCOUNTER — Ambulatory Visit (INDEPENDENT_AMBULATORY_CARE_PROVIDER_SITE_OTHER): Payer: Medicare Other

## 2018-09-26 ENCOUNTER — Ambulatory Visit (INDEPENDENT_AMBULATORY_CARE_PROVIDER_SITE_OTHER): Payer: Medicare Other | Admitting: Internal Medicine

## 2018-09-26 ENCOUNTER — Encounter: Payer: Self-pay | Admitting: Internal Medicine

## 2018-09-26 VITALS — BP 130/78 | HR 78 | Temp 98.6°F | Ht 64.0 in | Wt 110.8 lb

## 2018-09-26 DIAGNOSIS — R059 Cough, unspecified: Secondary | ICD-10-CM

## 2018-09-26 DIAGNOSIS — R9389 Abnormal findings on diagnostic imaging of other specified body structures: Secondary | ICD-10-CM

## 2018-09-26 DIAGNOSIS — R05 Cough: Secondary | ICD-10-CM

## 2018-09-26 DIAGNOSIS — R0781 Pleurodynia: Secondary | ICD-10-CM

## 2018-09-26 MED ORDER — IPRATROPIUM BROMIDE 0.02 % IN SOLN: 0.5000 mg | Freq: Once | RESPIRATORY_TRACT | Status: DC

## 2018-09-26 MED ORDER — AMOXICILLIN-POT CLAVULANATE 875-125 MG PO TABS: 1.0000 | ORAL_TABLET | Freq: Two times a day (BID) | ORAL | 0 refills | Status: DC

## 2018-09-26 MED ORDER — IPRATROPIUM BROMIDE 0.02 % IN SOLN
0.5000 mg | Freq: Once | RESPIRATORY_TRACT | Status: AC
  Administered 2018-09-26: 0.5 mg via RESPIRATORY_TRACT

## 2018-09-26 MED ORDER — ALBUTEROL SULFATE (2.5 MG/3ML) 0.083% IN NEBU: 2.5000 mg | INHALATION_SOLUTION | Freq: Once | RESPIRATORY_TRACT | Status: DC

## 2018-09-26 MED ORDER — ALBUTEROL SULFATE (2.5 MG/3ML) 0.083% IN NEBU
2.5000 mg | INHALATION_SOLUTION | Freq: Once | RESPIRATORY_TRACT | Status: AC
  Administered 2018-09-26: 2.5 mg via RESPIRATORY_TRACT

## 2018-09-26 NOTE — Addendum Note (Signed)
Addended by: Elpidio Galea T on: 09/26/2018 04:12 PM   Modules accepted: Orders

## 2018-09-26 NOTE — Patient Instructions (Signed)
Community-Acquired Pneumonia, Adult °Pneumonia is an infection of the lungs. There are different types of pneumonia. One type can develop while a person is in a hospital. A different type, called community-acquired pneumonia, develops in people who are not, or have not recently been, in the hospital or other health care facility. °What are the causes? ° °Pneumonia may be caused by bacteria, viruses, or funguses. Community-acquired pneumonia is often caused by Streptococcus pneumonia bacteria. These bacteria are often passed from one person to another by breathing in droplets from the cough or sneeze of an infected person. °What increases the risk? °The condition is more likely to develop in: °· People who have chronic diseases, such as chronic obstructive pulmonary disease (COPD), asthma, congestive heart failure, cystic fibrosis, diabetes, or kidney disease. °· People who have early-stage or late-stage HIV. °· People who have sickle cell disease. °· People who have had their spleen removed (splenectomy). °· People who have poor dental hygiene. °· People who have medical conditions that increase the risk of breathing in (aspirating) secretions their own mouth and nose. °· People who have a weakened immune system (immunocompromised). °· People who smoke. °· People who travel to areas where pneumonia-causing germs commonly exist. °· People who are around animal habitats or animals that have pneumonia-causing germs, including birds, bats, rabbits, cats, and farm animals. °What are the signs or symptoms? °Symptoms of this condition include: °· A dry cough. °· A wet (productive) cough. °· Fever. °· Sweating. °· Chest pain, especially when breathing deeply or coughing. °· Rapid breathing or difficulty breathing. °· Shortness of breath. °· Shaking chills. °· Fatigue. °· Muscle aches. °How is this diagnosed? °Your health care provider will take a medical history and perform a physical exam. You may also have other tests,  including: °· Imaging studies of your chest, including X-rays. °· Tests to check your blood oxygen level and other blood gases. °· Other tests on blood, mucus (sputum), fluid around your lungs (pleural fluid), and urine. °If your pneumonia is severe, other tests may be done to identify the specific cause of your illness. °How is this treated? °The type of treatment that you receive depends on many factors, such as the cause of your pneumonia, the medicines you take, and other medical conditions that you have. For most adults, treatment and recovery from pneumonia may occur at home. In some cases, treatment must happen in a hospital. Treatment may include: °· Antibiotic medicines, if the pneumonia was caused by bacteria. °· Antiviral medicines, if the pneumonia was caused by a virus. °· Medicines that are given by mouth or through an IV tube. °· Oxygen. °· Respiratory therapy. °Although rare, treating severe pneumonia may include: °· Mechanical ventilation. This is done if you are not breathing well on your own and you cannot maintain a safe blood oxygen level. °· Thoracentesis. This procedure removes fluid around one lung or both lungs to help you breathe better. °Follow these instructions at home: ° °· Take over-the-counter and prescription medicines only as told by your health care provider. °? Only take cough medicine if you are losing sleep. Understand that cough medicine can prevent your body’s natural ability to remove mucus from your lungs. °? If you were prescribed an antibiotic medicine, take it as told by your health care provider. Do not stop taking the antibiotic even if you start to feel better. °· Sleep in a semi-upright position at night. Try sleeping in a reclining chair, or place a few pillows under your head. °· Do not use tobacco products, including cigarettes, chewing tobacco, and e-cigarettes.   If you need help quitting, ask your health care provider. °· Drink enough water to keep your urine  clear or pale yellow. This will help to thin out mucus secretions in your lungs. °How is this prevented? °There are ways that you can decrease your risk of developing community-acquired pneumonia. Consider getting a pneumococcal vaccine if: °· You are older than 82 years of age. °· You are older than 82 years of age and are undergoing cancer treatment, have chronic lung disease, or have other medical conditions that affect your immune system. Ask your health care provider if this applies to you. °There are different types and schedules of pneumococcal vaccines. Ask your health care provider which vaccination option is best for you. °You may also prevent community-acquired pneumonia if you take these actions: °· Get an influenza vaccine every year. Ask your health care provider which type of influenza vaccine is best for you. °· Go to the dentist on a regular basis. °· Wash your hands often. Use hand sanitizer if soap and water are not available. °Contact a health care provider if: °· You have a fever. °· You are losing sleep because you cannot control your cough with cough medicine. °Get help right away if: °· You have worsening shortness of breath. °· You have increased chest pain. °· Your sickness becomes worse, especially if you are an older adult or have a weakened immune system. °· You cough up blood. °This information is not intended to replace advice given to you by your health care provider. Make sure you discuss any questions you have with your health care provider. °Document Released: 09/24/2005 Document Revised: 06/13/2017 Document Reviewed: 01/19/2015 °Elsevier Interactive Patient Education © 2019 [employer] ° °

## 2018-09-26 NOTE — Progress Notes (Signed)
Pre visit review using our clinic review tool, if applicable. No additional management support is needed unless otherwise documented below in the visit note. 

## 2018-09-26 NOTE — Progress Notes (Addendum)
Chief Complaint  Patient presents with  . Cough   Acute visit with husband   1. Dry Cough x 3 days, fatigue and fever max 100.2. She has been sleeping 11-12 hrs and this is not like her and when she takes a deep breath it hurts x 1 week. No sick contacts, she does have h/o pneumonia 2 years ago    Review of Systems  Constitutional: Positive for fever and malaise/fatigue.  HENT: Negative for hearing loss.   Eyes: Negative for blurred vision.  Respiratory: Positive for cough. Negative for sputum production and shortness of breath.   Cardiovascular: Positive for chest pain.  Gastrointestinal: Negative for abdominal pain.       +reduced appetite    Skin: Negative for rash.  Neurological: Negative for headaches.   Past Medical History:  Diagnosis Date  . History of breast cancer   . History of pneumonia   . Hypertension   . Hypothyroidism   . Macular degeneration    Past Surgical History:  Procedure Laterality Date  . ABDOMINAL HYSTERECTOMY    . APPENDECTOMY    . BREAST LUMPECTOMY    . CATARACT EXTRACTION, BILATERAL    . CHOLECYSTECTOMY    . REPLACEMENT TOTAL KNEE    . TONSILLECTOMY AND ADENOIDECTOMY     Family History  Problem Relation Age of Onset  . CAD Mother    Social History   Socioeconomic History  . Marital status: Married    Spouse name: Not on file  . Number of children: Not on file  . Years of education: Not on file  . Highest education level: Not on file  Occupational History  . Not on file  Social Needs  . Financial resource strain: Not on file  . Food insecurity:    Worry: Not on file    Inability: Not on file  . Transportation needs:    Medical: Not on file    Non-medical: Not on file  Tobacco Use  . Smoking status: Never Smoker  . Smokeless tobacco: Never Used  Substance and Sexual Activity  . Alcohol use: No    Alcohol/week: 0.0 standard drinks  . Drug use: No  . Sexual activity: Not on file  Lifestyle  . Physical activity:    Days  per week: Not on file    Minutes per session: Not on file  . Stress: Not on file  Relationships  . Social connections:    Talks on phone: Not on file    Gets together: Not on file    Attends religious service: Not on file    Active member of club or organization: Not on file    Attends meetings of clubs or organizations: Not on file    Relationship status: Not on file  . Intimate partner violence:    Fear of current or ex partner: Not on file    Emotionally abused: Not on file    Physically abused: Not on file    Forced sexual activity: Not on file  Other Topics Concern  . Not on file  Social History Narrative   Married    Current Meds  Medication Sig  . amLODipine (NORVASC) 10 MG tablet TAKE 1 TABLET(10 MG) BY MOUTH DAILY  . calcium citrate-vitamin D (CITRACAL+D) 315-200 MG-UNIT tablet Take 1 tablet by mouth 2 (two) times daily.  Marland Kitchen levothyroxine (SYNTHROID, LEVOTHROID) 112 MCG tablet TAKE 1 TABLET(112 MCG) BY MOUTH DAILY BEFORE BREAKFAST  . metoprolol succinate (TOPROL-XL) 50 MG 24 hr tablet  TAKE 1 TABLET BY MOUTH DAILY WITH OR IMMEDIATELY FOLLOWING A MEAL  . mirabegron ER (MYRBETRIQ) 25 MG TB24 tablet Take 25 mg by mouth daily.  . Multiple Vitamin (MULTIVITAMIN) capsule Take 1 capsule by mouth daily.  . Multiple Vitamins-Minerals (PRESERVISION AREDS 2 PO) Take by mouth.   No Known Allergies No results found for this or any previous visit (from the past 2160 hour(s)). Objective  There is no height or weight on file to calculate BMI. Wt Readings from Last 3 Encounters:  09/26/18 110 lb 12.8 oz (50.3 kg)  07/01/18 110 lb 12.8 oz (50.3 kg)  03/06/18 114 lb 3.2 oz (51.8 kg)   Temp Readings from Last 3 Encounters:  09/26/18 98.6 F (37 C) (Oral)  07/01/18 98.2 F (36.8 C) (Oral)  03/06/18 98.2 F (36.8 C) (Oral)   BP Readings from Last 3 Encounters:  09/26/18 130/78  07/01/18 130/68  03/06/18 130/70   Pulse Readings from Last 3 Encounters:  09/26/18 78  07/01/18 72   03/06/18 69    Physical Exam Vitals signs and nursing note reviewed.  Constitutional:      Appearance: Normal appearance.     Comments: Thin body habitus    HENT:     Head: Normocephalic and atraumatic.     Mouth/Throat:     Mouth: Mucous membranes are dry.     Pharynx: Oropharynx is clear.  Cardiovascular:     Rate and Rhythm: Normal rate and regular rhythm.     Heart sounds: Normal heart sounds.  Pulmonary:     Breath sounds: Wheezing and rhonchi present.  Skin:    General: Skin is warm and dry.  Neurological:     General: No focal deficit present.     Mental Status: She is alert and oriented to person, place, and time.     Gait: Gait normal.  Psychiatric:        Attention and Perception: Attention and perception normal.        Mood and Affect: Mood and affect normal.        Speech: Speech normal.        Behavior: Behavior normal. Behavior is cooperative.        Thought Content: Thought content normal.        Cognition and Memory: Cognition and memory normal.        Judgment: Judgment normal.     Assessment   1. Dry cough and pleuritic chest pain r/o pneumonia  Plan   1. augmentin bid x 1 week if not better consider add Zpack duoneb x 1  CXR  Robitussin DM or Mucinex DM green label  Cold handout  F/u in 2 weeks with PCP call back if not better    Provider: Dr. Olivia Mackie McLean[facility]

## 2018-09-26 NOTE — Progress Notes (Signed)
douneb has already been logged.

## 2018-09-29 ENCOUNTER — Encounter: Payer: Self-pay | Admitting: Internal Medicine

## 2018-09-29 ENCOUNTER — Telehealth: Payer: Self-pay | Admitting: Internal Medicine

## 2018-09-29 NOTE — Telephone Encounter (Signed)
Tried calling patient to get her STAT CT chest scheduled for her. The phone number in her chart and on her DPR sounds like a fax. No other phone numbers in her chart or DPR. Letter mailed to patient to rtc to schedule.

## 2018-10-06 ENCOUNTER — Telehealth: Payer: Self-pay | Admitting: Internal Medicine

## 2018-10-06 ENCOUNTER — Other Ambulatory Visit: Payer: Self-pay | Admitting: Internal Medicine

## 2018-10-06 DIAGNOSIS — I10 Essential (primary) hypertension: Secondary | ICD-10-CM

## 2018-10-06 NOTE — Progress Notes (Signed)
Order placed for BUN/cr

## 2018-10-06 NOTE — Telephone Encounter (Signed)
Peggy from [facility] calling to see if pt would need to be held after having stat CT of chest with contrast on tomorrow and Vickii Chafe also calling to receive contact number to call stat report of CT tomorrow. Kathryne Hitch, [facility] to see if pt would need to stay after exam. Per Juliann Pulse, pt would need to stay until results are called into provider. Peggy provided with office number, [phone] to call stat report and informed that pt would need to stay after exam until report in called to provider. Understanding verbalized.

## 2018-10-07 ENCOUNTER — Ambulatory Visit: Admission: RE | Admit: 2018-10-07 | Payer: Medicare Other | Source: Ambulatory Visit

## 2018-10-09 ENCOUNTER — Telehealth: Payer: Self-pay | Admitting: Internal Medicine

## 2018-10-09 ENCOUNTER — Ambulatory Visit
Admission: RE | Admit: 2018-10-09 | Discharge: 2018-10-09 | Disposition: A | Discharge status: Home / Self Care | Payer: Medicare Other | Source: Ambulatory Visit | Attending: Internal Medicine | Admitting: Internal Medicine

## 2018-10-09 ENCOUNTER — Ambulatory Visit: Payer: Self-pay | Admitting: *Deleted

## 2018-10-09 DIAGNOSIS — R9389 Abnormal findings on diagnostic imaging of other specified body structures: Secondary | ICD-10-CM | POA: Diagnosis not present

## 2018-10-09 LAB — POCT I-STAT CREATININE: Creatinine, Ser: 0.8 mg/dL (ref 0.44–1.00)

## 2018-10-09 MED ORDER — IOPAMIDOL (ISOVUE-300) INJECTION 61%
60.0000 mL | Freq: Once | INTRAVENOUS | Status: AC | PRN
  Administered 2018-10-09: 60 mL via INTRAVENOUS

## 2018-10-09 NOTE — Telephone Encounter (Signed)
Message from [facility] sent at 10/09/2018 10:22 AM EST   Vivien Rota with CT dept called with a stat report. Vivien Rota stated they were instructed to keep patient and not allow her to leave until the report is given to a nurse. Vivien Rota requests that a nurse returns her call. Cb# [phone]   Attempted to call Vivien Rota with the CT department x 3 with number provided but no answer at this time. In Epic it is noted that the pt had a  CT of the chest with contrast today. In Epic it is noted that Dr. Terese Door has made notes on the result note.

## 2018-10-09 NOTE — Telephone Encounter (Signed)
[facility] called with CT results  In chart.

## 2018-10-09 NOTE — Telephone Encounter (Signed)
Ordered by Dr. Aundra Dubin and has written result note

## 2018-10-11 ENCOUNTER — Other Ambulatory Visit: Payer: Self-pay | Admitting: Internal Medicine

## 2018-10-11 DIAGNOSIS — R9389 Abnormal findings on diagnostic imaging of other specified body structures: Secondary | ICD-10-CM

## 2018-10-11 NOTE — Progress Notes (Signed)
Order placed for pulmonary referral.  

## 2018-10-17 ENCOUNTER — Ambulatory Visit (INDEPENDENT_AMBULATORY_CARE_PROVIDER_SITE_OTHER): Payer: Medicare Other | Admitting: Internal Medicine

## 2018-10-17 ENCOUNTER — Encounter: Payer: Self-pay | Admitting: Internal Medicine

## 2018-10-17 DIAGNOSIS — E039 Hypothyroidism, unspecified: Secondary | ICD-10-CM

## 2018-10-17 DIAGNOSIS — Z853 Personal history of malignant neoplasm of breast: Secondary | ICD-10-CM | POA: Diagnosis not present

## 2018-10-17 DIAGNOSIS — I10 Essential (primary) hypertension: Secondary | ICD-10-CM

## 2018-10-17 DIAGNOSIS — E7800 Pure hypercholesterolemia, unspecified: Secondary | ICD-10-CM

## 2018-10-17 DIAGNOSIS — R9389 Abnormal findings on diagnostic imaging of other specified body structures: Secondary | ICD-10-CM

## 2018-10-17 MED ORDER — IRBESARTAN 75 MG PO TABS: 75.0000 mg | ORAL_TABLET | Freq: Every day | ORAL | 1 refills | Status: DC

## 2018-10-17 NOTE — Progress Notes (Signed)
[name], female   DOB: 1930/02/15, 85 y.o.   MRN: [id number]   Subjective:    [name], female    DOB: 03/06/1930, 85 y.o.   MRN: [id number]  HPI  Patient here for a scheduled follow up.  She is accompanied by her son.  History obtained from both of them.  She reports she is feeling better.  Recently evaluated for cough and fever.  Treated with abx.  Had abnormal cxr and then f/u chest CT.  Chest CT revealed bilateral bronchiectasis with peribronchial and dependent opacity in both lungs plus additional scattered tree-in-bud pulmonary inflammation - suggesting widespread bilateral pulmonary infection with areas of bronchopneumonia.  Probable mild improvement since the xray 09/26/18.  She reports she is doing better.  Feels better.  No cough, congestion or fever now.  No sob.  Tries to stay active.  No chest pain.  No acid reflux. No abdominal pain.  Bowels moving. Discussed CT scan.  Discussed pulmonary referral.  She is eating.  Weight is stable.  Blood pressure elevated - states averaging 854-627 systolic.     Past Medical History:  Diagnosis Date  . History of breast cancer   . History of pneumonia   . Hypertension   . Hypothyroidism   . Macular degeneration    Past Surgical History:  Procedure Laterality Date  . ABDOMINAL HYSTERECTOMY    . APPENDECTOMY    . BREAST LUMPECTOMY    . CATARACT EXTRACTION, BILATERAL    . CHOLECYSTECTOMY    . REPLACEMENT TOTAL KNEE    . TONSILLECTOMY AND ADENOIDECTOMY     Family History  Problem Relation Age of Onset  . CAD Mother    Social History   Socioeconomic History  . Marital status: Married    Spouse name: Not on file  . Number of children: Not on file  . Years of education: Not on file  . Highest education level: Not on file  Occupational History  . Not on file  Social Needs  . Financial resource strain: Not on file  . Food insecurity:    Worry: Not on file    Inability: Not on file  . Transportation  needs:    Medical: Not on file    Non-medical: Not on file  Tobacco Use  . Smoking status: Never Smoker  . Smokeless tobacco: Never Used  Substance and Sexual Activity  . Alcohol use: No    Alcohol/week: 0.0 standard drinks  . Drug use: No  . Sexual activity: Not on file  Lifestyle  . Physical activity:    Days per week: Not on file    Minutes per session: Not on file  . Stress: Not on file  Relationships  . Social connections:    Talks on phone: Not on file    Gets together: Not on file    Attends religious service: Not on file    Active member of club or organization: Not on file    Attends meetings of clubs or organizations: Not on file    Relationship status: Not on file  Other Topics Concern  . Not on file  Social History Narrative   Married     Outpatient Encounter Medications as of 10/17/2018  Medication Sig  . amLODipine (NORVASC) 10 MG tablet TAKE 1 TABLET(10 MG) BY MOUTH DAILY  . calcium citrate-vitamin D (CITRACAL+D) 315-200 MG-UNIT tablet Take 1 tablet by mouth 2 (two) times daily.  . irbesartan (AVAPRO) 75 MG tablet Take  1 tablet (75 mg total) by mouth daily.  Marland Kitchen levothyroxine (SYNTHROID, LEVOTHROID) 112 MCG tablet TAKE 1 TABLET(112 MCG) BY MOUTH DAILY BEFORE BREAKFAST  . metoprolol succinate (TOPROL-XL) 50 MG 24 hr tablet TAKE 1 TABLET BY MOUTH DAILY WITH OR IMMEDIATELY FOLLOWING A MEAL  . mirabegron ER (MYRBETRIQ) 25 MG TB24 tablet Take 25 mg by mouth daily.  . Multiple Vitamin (MULTIVITAMIN) capsule Take 1 capsule by mouth daily.  . Multiple Vitamins-Minerals (PRESERVISION AREDS 2 PO) Take by mouth.  . [DISCONTINUED] amoxicillin-clavulanate (AUGMENTIN) 875-125 MG tablet Take 1 tablet by mouth 2 (two) times daily. With food   No facility-administered encounter medications on file as of 10/17/2018.     Review of Systems  Constitutional:       Previous fever.  Resolved.  Eating.  Weight stable.    HENT: Negative for congestion and sinus pressure.     Respiratory: Negative for cough, chest tightness and shortness of breath.   Cardiovascular: Negative for chest pain, palpitations and leg swelling.  Gastrointestinal: Negative for abdominal pain, diarrhea, nausea and vomiting.  Genitourinary: Negative for difficulty urinating and dysuria.  Musculoskeletal: Negative for joint swelling and myalgias.  Skin: Negative for color change and rash.  Neurological: Negative for dizziness, light-headedness and headaches.  Psychiatric/Behavioral: Negative for agitation and dysphoric mood.       Objective:    Physical Exam Constitutional:      General: She is not in acute distress.    Appearance: Normal appearance.  HENT:     Nose: Nose normal. No congestion.     Mouth/Throat:     Pharynx: No oropharyngeal exudate or posterior oropharyngeal erythema.  Neck:     Musculoskeletal: Neck supple. No muscular tenderness.     Thyroid: No thyromegaly.  Cardiovascular:     Rate and Rhythm: Normal rate and regular rhythm.  Pulmonary:     Effort: No respiratory distress.     Breath sounds: Normal breath sounds. No wheezing.  Abdominal:     General: Bowel sounds are normal.     Palpations: Abdomen is soft.     Tenderness: There is no abdominal tenderness.  Musculoskeletal:        General: No swelling or tenderness.  Lymphadenopathy:     Cervical: No cervical adenopathy.  Skin:    Findings: No erythema or rash.  Neurological:     Mental Status: She is alert.  Psychiatric:        Mood and Affect: Mood normal.        Behavior: Behavior normal.     BP 140/78 (BP Location: Left Arm, Patient Position: Sitting, Cuff Size: Normal)   Pulse 74   Temp 97.6 F (36.4 C) (Oral)   Resp 18   Wt 109 lb 9.6 oz (49.7 kg)   SpO2 97%   BMI 18.81 kg/m  Wt Readings from Last 3 Encounters:  10/17/18 109 lb 9.6 oz (49.7 kg)  09/26/18 110 lb 12.8 oz (50.3 kg)  07/01/18 110 lb 12.8 oz (50.3 kg)     Lab Results  Component Value Date   WBC 5.3 06/27/2018    HGB 13.9 06/27/2018   HCT 41.3 06/27/2018   PLT 221.0 06/27/2018   GLUCOSE 95 06/27/2018   CHOL 180 06/27/2018   TRIG 74.0 06/27/2018   HDL 66.50 06/27/2018   LDLCALC 99 06/27/2018   ALT 21 06/27/2018   AST 33 06/27/2018   NA 143 06/27/2018   K 4.0 06/27/2018   CL 102 06/27/2018   CREATININE  0.80 10/09/2018   BUN 19 06/27/2018   CO2 35 (H) 06/27/2018   TSH 2.65 11/28/2017    Ct Chest W Contrast  Result Date: 10/09/2018 CLINICAL DATA:  85 year old female with abnormal pulmonary opacity on chest radiographs about 2 weeks ago. Cough. EXAM: CT CHEST WITH CONTRAST TECHNIQUE: Multidetector CT imaging of the chest was performed during intravenous contrast administration. CONTRAST:  16m ISOVUE-300 IOPAMIDOL (ISOVUE-300) INJECTION 61% COMPARISON:  Chest radiographs 09/26/2018. FINDINGS: Cardiovascular: Calcified coronary artery atherosclerosis. Mild calcified aortic atherosclerosis in the chest, more moderate in the visible upper abdomen. Mildly tortuous thoracic aorta. Mild cardiomegaly. No pericardial effusion. Mediastinum/Nodes: Negative. No mediastinal or hilar lymphadenopathy. Lungs/Pleura: Major airways are patent.  No pleural effusion. Bilateral middle lobe bronchiectasis, more apparent on the right (series 4, image 107) with associated peribronchial and dependent bilateral middle lobe opacity. Upper lobe bronchiectasis also greater on the right. Patchy and occasionally confluent posterior right upper lobe peribronchial and dependent opacity. Similar but less pronounced findings in the right lower lobe. Superimposed additional scattered bilateral tree-in-bud type peribronchial nodular opacity (left upper lobe series 4, images 40 through 47. There is a more spiculated appearing 7-8 millimeter lateral basal segment right lower lobe nodule on series 4, image 100. Compared to 09/26/2018, bilateral pulmonary opacity is probably mildly regressed. Upper Abdomen: Negative visible liver, spleen,  pancreas, adrenal glands, stomach and left kidney. Musculoskeletal: No acute osseous abnormality identified. Mild for age spinal degeneration. IMPRESSION: 1. Bilateral bronchiectasis with peribronchial and dependent opacity in both lungs plus additional scattered tree-in-bud pulmonary inflammation. This suggests widespread bilateral pulmonary infection with areas of bronchopneumonia. No pleural effusion. Probable mild improvement since the radiographs on 09/26/2018. Consider atypical infectious etiologies including MAI. 2. In general the pulmonary nodular opacities in #1 appear inflammatory. But recommend post treatment noncontrast chest CT with attention directed to the 7-8 mm right lower lobe nodule on series 4, image 100. 3. Mild cardiomegaly. Calcified coronary artery atherosclerosis and mild aortic atherosclerosis. Electronically Signed   By: HGenevie AnnM.D.   On: 10/09/2018 10:04       Assessment & Plan:   Problem List Items Addressed This Visit    Abnormal chest CT    Chest CT as outlined.  She is currently not having fever or cough.  Resolved with treatment.  Given extensive changes on CT, has appt scheduled to see pulmonary for further evaluation and w/up.  Pt and son in agreement.        History of breast cancer    S/p lumpectomy.  Did not require chemo.  Declines f/u mammogram.        Hypercholesterolemia    Has declined cholesterol medication.  Follow lipid panel.        Relevant Medications   irbesartan (AVAPRO) 75 MG tablet   Hypertension    Persistent elevated blood pressure.  Start avapro.  Follow pressures.  Will need f/u met b and 10-14 days.        Relevant Medications   irbesartan (AVAPRO) 75 MG tablet   Other Relevant Orders   Basic metabolic panel   Hypothyroidism    On thyroid replacement.  Follow tsh.        Relevant Orders   TSH       CEinar Pheasant MD

## 2018-10-19 ENCOUNTER — Encounter: Payer: Self-pay | Admitting: Internal Medicine

## 2018-10-19 DIAGNOSIS — R9389 Abnormal findings on diagnostic imaging of other specified body structures: Secondary | ICD-10-CM | POA: Insufficient documentation

## 2018-10-19 NOTE — Assessment & Plan Note (Signed)
On thyroid replacement.  Follow tsh.  

## 2018-10-19 NOTE — Assessment & Plan Note (Signed)
S/p lumpectomy.  Did not require chemo.  Declines f/u mammogram.

## 2018-10-19 NOTE — Assessment & Plan Note (Signed)
Has declined cholesterol medication.  Follow lipid panel.  

## 2018-10-19 NOTE — Assessment & Plan Note (Signed)
Chest CT as outlined.  She is currently not having fever or cough.  Resolved with treatment.  Given extensive changes on CT, has appt scheduled to see pulmonary for further evaluation and w/up.  Pt and son in agreement.

## 2018-10-19 NOTE — Assessment & Plan Note (Signed)
Persistent elevated blood pressure.  Start avapro.  Follow pressures.  Will need f/u met b and 10-14 days.

## 2018-10-27 ENCOUNTER — Encounter: Payer: Self-pay | Admitting: Pulmonary Disease

## 2018-10-27 ENCOUNTER — Ambulatory Visit (INDEPENDENT_AMBULATORY_CARE_PROVIDER_SITE_OTHER): Payer: Medicare Other | Admitting: Pulmonary Disease

## 2018-10-27 VITALS — BP 118/80 | HR 67 | Ht 64.5 in | Wt 108.6 lb

## 2018-10-27 DIAGNOSIS — R918 Other nonspecific abnormal finding of lung field: Secondary | ICD-10-CM | POA: Diagnosis not present

## 2018-10-27 DIAGNOSIS — J479 Bronchiectasis, uncomplicated: Secondary | ICD-10-CM | POA: Diagnosis not present

## 2018-10-27 NOTE — Patient Instructions (Signed)
1.  You will be scheduled for a repeat CT scan in 2 to 3 months time.  We will follow-up with you at around the same time.  2.  Feel free to call us before that should you have any new issues such as cough, fever or shortness of breath.

## 2018-10-27 NOTE — Progress Notes (Signed)
Subjective:    [name], female    DOB: Apr 03, 1930, 85 y.o.   MRN: [id number]  HPI Alexa Pham is a delightful 85 year old lifelong never smoker who presents for evaluation of an abnormal CT scan of the chest.  The patient is kindly referred by Dr. Einar Pheasant.  The patient noted that she had difficulties with a dry cough and fevers approximately 4 to 5 weeks ago.  She was treated for bronchitis versus potential pneumonia.  She does not recall receiving a Z-Pak but states that she had some other antibiotic.  She noted that her T-max at that time was approximately 100.2.  She was fatigued and was quite sleepy during that illness.  Also had some pleuritic chest pain.  As noted these issues resolved with conservative management and no antibiotics.  The chest CT showed evidence of bronchiectasis, tree-in-bud type of peribronchial nodular opacities, she did have patchy infiltrates noted consistent with active pneumonitis.  I have independently reviewed these films.  The patient is now totally asymptomatic.  She does not endorse any cough, fevers, chills or sweats.  She has not had any weight loss or anorexia.  Her stamina is back to baseline.  She is back to performing her activities of daily living without difficulty.  He has not had any orthopnea, paroxysmal nocturnal dyspnea or lower extremity edema.  Past medical history, surgical history, and family history have been reviewed.  Social history she does not have a history of occupational exposure, she is a lifelong never smoker.  Review of Systems  Constitutional: Negative.   HENT: Negative.   Eyes: Negative.   Respiratory: Positive for cough (Almost completely resolved.).   Cardiovascular: Negative.   Gastrointestinal: Negative.   Endocrine: Negative.   Genitourinary: Negative.   Musculoskeletal: Negative.   Skin: Negative.   Allergic/Immunologic: Negative.   Neurological: Negative.   Hematological: Negative.     Psychiatric/Behavioral: Negative.        Objective:   Physical Exam Vitals signs and nursing note reviewed.  Constitutional:      Appearance: She is well-developed, well-groomed and underweight. She is not ill-appearing or toxic-appearing.  HENT:     Head: Normocephalic and atraumatic.     Right Ear: External ear normal.     Left Ear: External ear normal.     Nose: Nose normal.     Mouth/Throat:     Mouth: Mucous membranes are moist.     Pharynx: Oropharynx is clear.  Eyes:     Extraocular Movements: Extraocular movements intact.     Conjunctiva/sclera: Conjunctivae normal.     Pupils: Pupils are equal, round, and reactive to light.  Neck:     Musculoskeletal: Neck supple.  Cardiovascular:     Rate and Rhythm: Normal rate and regular rhythm.     Pulses: Normal pulses.     Heart sounds: Normal heart sounds. No murmur.  Pulmonary:     Effort: Pulmonary effort is normal.     Breath sounds: No stridor. No wheezing or rales.     Comments: Coarse breath sounds Chest:     Chest wall: No tenderness.  Abdominal:     General: Abdomen is flat. There is no distension.     Palpations: Abdomen is soft.  Musculoskeletal: Normal range of motion.     Right lower leg: No edema.     Left lower leg: No edema.  Lymphadenopathy:     Cervical: No cervical adenopathy.  Skin:    General: Skin  is warm and dry.  Neurological:     General: No focal deficit present.     Mental Status: She is alert and oriented to person, place, and time.  Psychiatric:        Mood and Affect: Mood normal.        Behavior: Behavior normal.      Areas of bronchiectasis with patchy infiltrate noted.  Tree-in-bud opacities also noted.     Assessment & Plan:   1.  Abnormal CT scanning of the lung: The patient has findings consistent with potential MAI in her chest CT.  There are areas however that appear to be active or new pneumonitis.  Chest CT was dated 2 January.  It is possible the patient had  superimposed pneumonia on underlying MAI colonization.  She fits the profile of the typical MAI colonization patient that is slender, elderly female.  Will proceed with rechecking CT scan of the chest 3 months from the initial imaging date.  Currently the patient is not symptomatic to warrant potential therapy for MAI.  If repeat CT shows cavitation and alveolar destruction the patient will need bronchoscopy and potential therapy for MAI.  2.  Bronchiectasis with recent exacerbation: Cannot exclude superimposed pneumonitis.  We will treat her empirically with Azithromycin.  We will see the patient in follow-up in 2 months time.  Thank you for allowing me to participate in this patient's care.

## 2018-11-07 ENCOUNTER — Encounter: Payer: Self-pay | Admitting: Pulmonary Disease

## 2018-11-07 ENCOUNTER — Other Ambulatory Visit: Payer: Self-pay | Admitting: Internal Medicine

## 2018-11-07 DIAGNOSIS — J479 Bronchiectasis, uncomplicated: Secondary | ICD-10-CM | POA: Insufficient documentation

## 2018-11-07 DIAGNOSIS — R918 Other nonspecific abnormal finding of lung field: Secondary | ICD-10-CM | POA: Insufficient documentation

## 2018-11-12 ENCOUNTER — Other Ambulatory Visit: Payer: Self-pay

## 2018-11-12 DIAGNOSIS — R0989 Other specified symptoms and signs involving the circulatory and respiratory systems: Secondary | ICD-10-CM

## 2018-11-12 NOTE — Telephone Encounter (Signed)
Pt has been scheduled.  °

## 2018-11-20 ENCOUNTER — Ambulatory Visit
Admission: RE | Admit: 2018-11-20 | Discharge: 2018-11-20 | Disposition: A | Discharge status: Home / Self Care | Payer: Medicare Other | Source: Ambulatory Visit | Attending: Internal Medicine | Admitting: Internal Medicine

## 2018-11-20 ENCOUNTER — Encounter: Payer: Self-pay | Admitting: Internal Medicine

## 2018-11-20 DIAGNOSIS — R0989 Other specified symptoms and signs involving the circulatory and respiratory systems: Secondary | ICD-10-CM

## 2018-11-20 DIAGNOSIS — I700 Atherosclerosis of aorta: Secondary | ICD-10-CM | POA: Insufficient documentation

## 2018-11-21 ENCOUNTER — Telehealth: Payer: Self-pay | Admitting: Internal Medicine

## 2018-11-21 NOTE — Telephone Encounter (Unsigned)
Copied from [place] [id number]. Topic: Quick Communication - Lab Results (Clinic Use ONLY) >> Nov 21, 2018  9:08 AM Gordy Councilman, CMA wrote: Called patient to inform them of 11/20/2018 lab results. When patient returns call, triage nurse may disclose results.  Gae Bon, CMA

## 2018-11-21 NOTE — Telephone Encounter (Signed)
Patient notified

## 2018-11-25 ENCOUNTER — Other Ambulatory Visit: Payer: Self-pay | Admitting: Internal Medicine

## 2018-12-08 ENCOUNTER — Ambulatory Visit: Payer: Medicare Other

## 2018-12-08 ENCOUNTER — Ambulatory Visit: Payer: Medicare Other | Admitting: Internal Medicine

## 2018-12-16 ENCOUNTER — Other Ambulatory Visit: Payer: Self-pay | Admitting: Internal Medicine

## 2018-12-16 MED ORDER — IRBESARTAN 75 MG PO TABS: 75.0000 mg | ORAL_TABLET | Freq: Every day | ORAL | 0 refills | Status: DC

## 2018-12-16 NOTE — Telephone Encounter (Signed)
Copied from [place] [id number]. Topic: Quick Communication - Rx Refill/Question >> Dec 16, 2018  9:12 AM Alexa Pham wrote: Medication: irbesartan (AVAPRO) 75 MG tablet  Has the patient contacted their pharmacy? Yes (Agent: If no, request that the patient contact the pharmacy for the refill.) (Agent: If yes, when and what did the pharmacy advise?)  Preferred Pharmacy (with phone number or street name): [id number] - [address], [place] [address] [place] [address] [place] [address] Phone: [phone] Fax: [phone]    Agent: Please be advised that RX refills may take up to 3 business days. We ask that you follow-up with your pharmacy.

## 2018-12-25 ENCOUNTER — Telehealth: Payer: Self-pay | Admitting: Pulmonary Disease

## 2018-12-25 NOTE — Telephone Encounter (Signed)
Communicated with [place], ok to r/s CT per Dr. Patsey Berthold. We will r/s f/u apt once CT done. Nothing further needed at this time.

## 2018-12-26 ENCOUNTER — Ambulatory Visit: Admission: RE | Admit: 2018-12-26 | Payer: Medicare Other | Source: Ambulatory Visit

## 2018-12-29 ENCOUNTER — Other Ambulatory Visit: Payer: Self-pay | Admitting: Internal Medicine

## 2018-12-29 ENCOUNTER — Ambulatory Visit: Payer: Medicare Other | Admitting: Pulmonary Disease

## 2019-01-02 ENCOUNTER — Encounter: Payer: Self-pay | Admitting: Internal Medicine

## 2019-01-02 ENCOUNTER — Other Ambulatory Visit: Payer: Self-pay

## 2019-01-02 ENCOUNTER — Ambulatory Visit (INDEPENDENT_AMBULATORY_CARE_PROVIDER_SITE_OTHER): Payer: Medicare Other | Admitting: Internal Medicine

## 2019-01-02 VITALS — BP 136/80 | HR 77 | Temp 97.9°F | Resp 16 | Wt 110.6 lb

## 2019-01-02 DIAGNOSIS — E039 Hypothyroidism, unspecified: Secondary | ICD-10-CM

## 2019-01-02 DIAGNOSIS — I10 Essential (primary) hypertension: Secondary | ICD-10-CM | POA: Diagnosis not present

## 2019-01-02 DIAGNOSIS — I499 Cardiac arrhythmia, unspecified: Secondary | ICD-10-CM

## 2019-01-02 DIAGNOSIS — J479 Bronchiectasis, uncomplicated: Secondary | ICD-10-CM

## 2019-01-02 DIAGNOSIS — E7800 Pure hypercholesterolemia, unspecified: Secondary | ICD-10-CM

## 2019-01-02 DIAGNOSIS — I700 Atherosclerosis of aorta: Secondary | ICD-10-CM | POA: Diagnosis not present

## 2019-01-02 DIAGNOSIS — R9389 Abnormal findings on diagnostic imaging of other specified body structures: Secondary | ICD-10-CM

## 2019-01-02 NOTE — Progress Notes (Signed)
[name], female   DOB: 09/07/1930, 85 y.o.   MRN: [id number]   Subjective:    [name], female    DOB: 03-14-1930, 85 y.o.   MRN: [id number]  HPI  Patient here for a scheduled follow up.  She reports she is doing well.  Feels good.  No cough or congestion.  No sob.  No chest pain.  No acid reflux.  No abdominal pain.  Bowels moving. No urine change.  Reports history of noticing occasional palpitations.  This has occurred for years.  Has not noticed recently.  Tries to stay active.  Saw pulmonary.  Recommended f/u CT chest.  Scheduled.    Past Medical History:  Diagnosis Date   History of breast cancer    History of pneumonia    Hypertension    Hypothyroidism    Macular degeneration    Past Surgical History:  Procedure Laterality Date   ABDOMINAL HYSTERECTOMY     APPENDECTOMY     BREAST LUMPECTOMY     CATARACT EXTRACTION, BILATERAL     CHOLECYSTECTOMY     REPLACEMENT TOTAL KNEE     TONSILLECTOMY AND ADENOIDECTOMY     Family History  Problem Relation Age of Onset   CAD Mother    Social History   Socioeconomic History   Marital status: Married    Spouse name: Not on file   Number of children: Not on file   Years of education: Not on file   Highest education level: Not on file  Occupational History   Not on file  Social Needs   Financial resource strain: Not on file   Food insecurity:    Worry: Not on file    Inability: Not on file   Transportation needs:    Medical: Not on file    Non-medical: Not on file  Tobacco Use   Smoking status: Never Smoker   Smokeless tobacco: Never Used  Substance and Sexual Activity   Alcohol use: No    Alcohol/week: 0.0 standard drinks   Drug use: No   Sexual activity: Not on file  Lifestyle   Physical activity:    Days per week: Not on file    Minutes per session: Not on file   Stress: Not on file  Relationships   Social connections:    Talks on phone: Not on file    Gets together: Not on file    Attends religious service: Not on file    Active member of club or organization: Not on file    Attends meetings of clubs or organizations: Not on file    Relationship status: Not on file  Other Topics Concern   Not on file  Social History Narrative   Married     Outpatient Encounter Medications as of 01/02/2019  Medication Sig   amLODipine (NORVASC) 10 MG tablet TAKE 1 TABLET(10 MG) BY MOUTH DAILY   calcium citrate-vitamin D (CITRACAL+D) 315-200 MG-UNIT tablet Take 1 tablet by mouth 2 (two) times daily.   irbesartan (AVAPRO) 75 MG tablet Take 1 tablet (75 mg total) by mouth daily.   levothyroxine (SYNTHROID, LEVOTHROID) 112 MCG tablet TAKE 1 TABLET(112 MCG) BY MOUTH DAILY BEFORE BREAKFAST   metoprolol succinate (TOPROL-XL) 50 MG 24 hr tablet TAKE 1 TABLET BY MOUTH DAILY WITH OR IMMEDIATELY FOLLOWING A MEAL   mirabegron ER (MYRBETRIQ) 25 MG TB24 tablet Take 25 mg by mouth daily.   Multiple Vitamin (MULTIVITAMIN) capsule Take 1 capsule by mouth daily.  Multiple Vitamins-Minerals (PRESERVISION AREDS 2 PO) Take by mouth.   No facility-administered encounter medications on file as of 01/02/2019.     Review of Systems  Constitutional: Negative for appetite change and unexpected weight change.  HENT: Negative for congestion and sinus pressure.   Respiratory: Negative for cough, chest tightness and shortness of breath.   Cardiovascular: Negative for chest pain, palpitations and leg swelling.  Gastrointestinal: Negative for abdominal pain, diarrhea, nausea and vomiting.  Genitourinary: Negative for difficulty urinating and dysuria.  Musculoskeletal: Negative for joint swelling and myalgias.  Skin: Negative for color change and rash.  Neurological: Negative for dizziness, light-headedness and headaches.  Psychiatric/Behavioral: Negative for agitation and dysphoric mood.       Objective:    Physical Exam Constitutional:      General: She is not  in acute distress.    Appearance: Normal appearance.  HENT:     Nose: Nose normal. No congestion.     Mouth/Throat:     Pharynx: No oropharyngeal exudate or posterior oropharyngeal erythema.  Neck:     Musculoskeletal: Neck supple. No muscular tenderness.     Thyroid: No thyromegaly.  Cardiovascular:     Rate and Rhythm: Normal rate and regular rhythm.  Pulmonary:     Effort: No respiratory distress.     Breath sounds: Normal breath sounds. No wheezing.  Abdominal:     General: Bowel sounds are normal.     Palpations: Abdomen is soft.     Tenderness: There is no abdominal tenderness.  Musculoskeletal:        General: No swelling or tenderness.  Lymphadenopathy:     Cervical: No cervical adenopathy.  Skin:    Findings: No erythema or rash.  Neurological:     Mental Status: She is alert.  Psychiatric:        Mood and Affect: Mood normal.        Behavior: Behavior normal.     BP 136/80    Pulse 77    Temp 97.9 F (36.6 C) (Oral)    Resp 16    Wt 110 lb 9.6 oz (50.2 kg)    SpO2 97%    BMI 18.69 kg/m  Wt Readings from Last 3 Encounters:  01/02/19 110 lb 9.6 oz (50.2 kg)  10/27/18 108 lb 9.6 oz (49.3 kg)  10/17/18 109 lb 9.6 oz (49.7 kg)     Lab Results  Component Value Date   WBC 5.3 06/27/2018   HGB 13.9 06/27/2018   HCT 41.3 06/27/2018   PLT 221.0 06/27/2018   GLUCOSE 88 01/02/2019   CHOL 180 06/27/2018   TRIG 74.0 06/27/2018   HDL 66.50 06/27/2018   LDLCALC 99 06/27/2018   ALT 21 06/27/2018   AST 33 06/27/2018   NA 141 01/02/2019   K 3.9 01/02/2019   CL 101 01/02/2019   CREATININE 0.81 01/02/2019   BUN 22 01/02/2019   CO2 29 01/02/2019   TSH 2.65 11/28/2017    [place] Aaa Duplex Limited  Result Date: 11/20/2018 CLINICAL DATA:  Abdominal bruit.  History of hypertension. EXAM: ULTRASOUND OF ABDOMINAL AORTA TECHNIQUE: Ultrasound examination of the abdominal aorta and proximal common iliac arteries was performed to evaluate for aneurysm. Additional color and  Doppler images of the distal aorta were obtained to document patency. COMPARISON:  None. FINDINGS: Abdominal aortic measurements as follows: Proximal:  2.3 x 1.8 cm Mid:  2.0 x 1.8 cm Distal:  2.1 x 1.7 cm Patent: Yes, peak systolic velocity is 76.1 cm/s  Right common iliac artery: 1.4 x 1.1 cm Left common iliac artery: 1.2 x 1.2 cm Scattered eccentric mixed echogenic plaque is seen throughout the abdominal aorta. IMPRESSION: 1. No evidence of abdominal aortic aneurysm. 2.  Aortic Atherosclerosis (ICD10-I70.0). Electronically Signed   By: Sandi Mariscal M.D.   On: 11/20/2018 09:17       Assessment & Plan:   Problem List Items Addressed This Visit    Abnormal chest CT    Saw pulmonary.  No cough or congestion now.  Planning for f/u chest CT and f/u with pulmonary.        Aortic atherosclerosis (HCC)    Not on a statin medication.  Pt declined.  Follow.        Bronchiectasis without complication ([place])    Changes on CT c/w bronchiectasis.  Planning for f/u chest CT.  Scheduled.  No cough or congestion now. Keep f/u with pulmonary.        Hypercholesterolemia    Declined cholesterol medication.  Follow lipid panel.       Hypertension    Blood pressure better on avapro.  Her outside checks vary, but overall under good control.  Continue current medication regimen.  Follow pressures.  Follow metabolic panel.        Relevant Orders   Basic metabolic panel (Completed)   Hypothyroidism    On thyroid replacement.  Follow tsh.        Other Visit Diagnoses    Cardiac arrhythmia, unspecified cardiac arrhythmia type    -  Primary   Appeared to be in SR with premature beat.  EKG performed secondary to notice of premature beat on exam.  EKG - SR with PAC.  currently asymptomatic.  Follow.     Relevant Orders   EKG 12-Lead (Completed)       Einar Pheasant, MD

## 2019-01-03 LAB — BASIC METABOLIC PANEL
BUN: 22 mg/dL (ref 7–25)
CALCIUM: 8.9 mg/dL (ref 8.6–10.4)
CO2: 29 mmol/L (ref 20–32)
Chloride: 101 mmol/L (ref 98–110)
Creat: 0.81 mg/dL (ref 0.60–0.88)
GLUCOSE: 88 mg/dL (ref 65–99)
Potassium: 3.9 mmol/L (ref 3.5–5.3)
Sodium: 141 mmol/L (ref 135–146)

## 2019-01-04 ENCOUNTER — Encounter: Payer: Self-pay | Admitting: Internal Medicine

## 2019-01-04 NOTE — Assessment & Plan Note (Signed)
Changes on CT c/w bronchiectasis.  Planning for f/u chest CT.  Scheduled.  No cough or congestion now. Keep f/u with pulmonary.

## 2019-01-04 NOTE — Assessment & Plan Note (Signed)
Blood pressure better on avapro.  Her outside checks vary, but overall under good control.  Continue current medication regimen.  Follow pressures.  Follow metabolic panel.

## 2019-01-04 NOTE — Assessment & Plan Note (Addendum)
Not on a statin medication.  Pt declined.  Follow.

## 2019-01-04 NOTE — Assessment & Plan Note (Signed)
Declined cholesterol medication.  Follow lipid panel.

## 2019-01-04 NOTE — Assessment & Plan Note (Signed)
Saw pulmonary.  No cough or congestion now.  Planning for f/u chest CT and f/u with pulmonary.

## 2019-01-04 NOTE — Assessment & Plan Note (Signed)
On thyroid replacement.  Follow tsh.  

## 2019-01-14 ENCOUNTER — Other Ambulatory Visit: Payer: Self-pay | Admitting: Internal Medicine

## 2019-01-14 MED ORDER — IRBESARTAN 75 MG PO TABS: 75.0000 mg | ORAL_TABLET | Freq: Every day | ORAL | 1 refills | Status: DC

## 2019-01-27 ENCOUNTER — Ambulatory Visit: Admission: RE | Admit: 2019-01-27 | Payer: Medicare Other | Source: Ambulatory Visit

## 2019-01-30 ENCOUNTER — Ambulatory Visit: Payer: Medicare Other | Admitting: Pulmonary Disease

## 2019-02-11 ENCOUNTER — Other Ambulatory Visit: Payer: Self-pay

## 2019-02-11 ENCOUNTER — Telehealth: Payer: Self-pay | Admitting: Internal Medicine

## 2019-02-11 MED ORDER — AMLODIPINE BESYLATE 10 MG PO TABS: ORAL_TABLET | ORAL | 0 refills | Status: DC

## 2019-02-11 NOTE — Telephone Encounter (Unsigned)
Copied from [place] [id number]. Topic: Quick Communication - Rx Refill/Question >> Feb 11, 2019  8:34 AM Alanda Slim E wrote: Medication: amLODipine (NORVASC) 10 MG tablet   Has the patient contacted their pharmacy? Yes- Pharmacy gave her emergency pills to take until Rx was sent in.   Preferred Pharmacy (with phone number or street name): [id number] - [place], [place] - [place] [phone] (Phone) [phone] (Fax)    Agent: Please be advised that RX refills may take up to 3 business days. We ask that you follow-up with your pharmacy.

## 2019-02-11 NOTE — Telephone Encounter (Signed)
Refill for norvasc was sent to pharmacy.  Nin,cma.

## 2019-02-23 ENCOUNTER — Telehealth: Payer: Self-pay | Admitting: Internal Medicine

## 2019-02-23 ENCOUNTER — Other Ambulatory Visit: Payer: Self-pay

## 2019-02-23 DIAGNOSIS — E039 Hypothyroidism, unspecified: Secondary | ICD-10-CM

## 2019-02-23 MED ORDER — LEVOTHYROXINE SODIUM 112 MCG PO TABS: ORAL_TABLET | ORAL | 0 refills | Status: DC

## 2019-02-23 NOTE — Telephone Encounter (Signed)
Copied from [place] [id number]. Topic: Quick Communication - Rx Refill/Question >> Feb 23, 2019 11:22 AM Sheran Luz wrote: Medication: levothyroxine (SYNTHROID, LEVOTHROID) 112 MCG tablet   Patient is requesting refill of this medication.    Preferred Pharmacy (with phone number or street name):[id number] - [place], [place] [phone] (Phone) [phone] (Fax)

## 2019-02-23 NOTE — Progress Notes (Signed)
Refill for levothyroxine sent to pharmacy.  Nina,cma

## 2019-03-09 ENCOUNTER — Ambulatory Visit: Payer: Medicare Other

## 2019-03-10 ENCOUNTER — Ambulatory Visit
Admission: RE | Admit: 2019-03-10 | Discharge: 2019-03-10 | Disposition: A | Discharge status: Home / Self Care | Payer: Medicare Other | Source: Ambulatory Visit | Attending: Pulmonary Disease | Admitting: Pulmonary Disease

## 2019-03-10 ENCOUNTER — Other Ambulatory Visit: Payer: Self-pay

## 2019-03-10 DIAGNOSIS — R918 Other nonspecific abnormal finding of lung field: Secondary | ICD-10-CM | POA: Diagnosis present

## 2019-03-11 ENCOUNTER — Telehealth: Payer: Self-pay | Admitting: Pulmonary Disease

## 2019-03-11 NOTE — Telephone Encounter (Signed)
Called patient for COVID-19 pre-screening for in office visit.  Have you recently traveled any where out of the local area in the last 2 weeks? NO Have you been in close contact with a person diagnosed with COVID-19 within the last 2 weeks? NO Do you currently have any of the following symptoms? If so, when did they start? Cough     Diarrhea   Joint Pain Fever      Muscle Pain   Red eyes Shortness of breath   Abdominal pain  Vomiting Loss of smell    Rash    Sore Throat Headache    Weakness   Bruising or bleeding   Okay to proceed with visit 03/12/2019

## 2019-03-12 ENCOUNTER — Other Ambulatory Visit: Payer: Self-pay

## 2019-03-12 ENCOUNTER — Ambulatory Visit (INDEPENDENT_AMBULATORY_CARE_PROVIDER_SITE_OTHER): Payer: Medicare Other | Admitting: Pulmonary Disease

## 2019-03-12 ENCOUNTER — Encounter: Payer: Self-pay | Admitting: Pulmonary Disease

## 2019-03-12 VITALS — BP 138/70 | HR 60 | Temp 98.1°F | Ht 66.5 in | Wt 109.4 lb

## 2019-03-12 DIAGNOSIS — J479 Bronchiectasis, uncomplicated: Secondary | ICD-10-CM | POA: Diagnosis not present

## 2019-03-12 DIAGNOSIS — R918 Other nonspecific abnormal finding of lung field: Secondary | ICD-10-CM | POA: Diagnosis not present

## 2019-03-12 NOTE — Patient Instructions (Signed)
1.  Take N-acetylcysteine (NAC) 600 mg's a day with meals.  This is an anti-inflammatory supplement.  Can get this at stores such as [employer] or [place].  2.  We will see you in follow-up in 6 months time.  Call sooner if you have any new lung issues.

## 2019-03-12 NOTE — Progress Notes (Signed)
Subjective:    [name], female    DOB: 1930-01-22, 85 y.o.   MRN: [id number]  HPI The patient is an 85 year old lifelong never smoker whom we evaluated initially in January 2020 after she was noted to have an abnormal CT scan of the chest.  At that time the patient had had difficulties with a dry cough and a fever was treated empirically for pneumonia.  CT scan was consistent with areas of bronchiectasis with tree-in-bud opacities and patchy infiltrates.  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  She had a CT scan of the chest repeated on 10 March 2019.  Some of the areas have improved while others have appeared.  Consistent with the evanescent nodules/infiltrates noted with MAC.  He presents today totally asymptomatic.  She does not endorse any cough, fevers chills or sweats.  She has not had any weight loss or anorexia.  She does not have any productive cough during the day.  She notes to be doing well and is back to doing her activities of daily living without difficulty.  Does not describe any chest pain, orthopnea, paroxysmal nocturnal dyspnea or lower extremity edema.   Review of Systems  Constitutional: Negative.   HENT: Negative.   Respiratory: Negative.   Cardiovascular: Negative.   Gastrointestinal: Negative.   Endocrine: Negative.   Allergic/Immunologic: Negative.   Neurological: Negative.   All other systems reviewed and are negative.      Objective:   Physical Exam Constitutional:      Appearance: She is underweight. She is not ill-appearing or toxic-appearing.  HENT:     Head: Normocephalic and atraumatic.     Right Ear: External ear normal.     Left Ear: External ear normal.     Nose:     Comments: Nose, mouth, throat: Not examined as the patient due to face masking requirements during COVID-19. Eyes:     General: No scleral icterus.    Conjunctiva/sclera: Conjunctivae normal.     Pupils: Pupils are  equal, round, and reactive to light.  Neck:     Musculoskeletal: Neck supple.  Cardiovascular:     Rate and Rhythm: Normal rate and regular rhythm.     Pulses: Normal pulses.     Heart sounds: Normal heart sounds.  Pulmonary:     Effort: Pulmonary effort is normal.     Breath sounds: Normal breath sounds.  Abdominal:     General: Abdomen is flat. There is no distension.     Palpations: Abdomen is soft.  Musculoskeletal: Normal range of motion.     Right lower leg: No edema.     Left lower leg: No edema.  Skin:    General: Skin is warm and dry.  Neurological:     General: No focal deficit present.     Mental Status: She is alert and oriented to person, place, and time.  Psychiatric:        Mood and Affect: Mood normal.        Behavior: Behavior normal.    CT scan of the chest was independently reviewed and is as noted below.  There are patchy areas of tree-in-bud changes.  Airspace nodularity and funky ectasis.  Some of the areas have resolved and some are new.  This is typical for MAC.       Assessment & Plan:   1.  Abnormal CT scanning of the lung: The patient has findings consistent with potential MAI in her chest  CT.   she does not have fibrocavitary disease nor areas of lung parenchymal destruction.  She currently does not have any symptomatology.  Treatment for MAI would be reserved only if she develops fibrocavitary disease or symptoms.  The regimen usually causes significant side effects and is generally poorly tolerated.  She fits the profile of the typical MAC colonization patient that is slender, elderly female.  Currently also, she does not require bronchoscopic evaluation.  2.  Bronchiectasis without exacerbation:  She was instructed in proper pulmonary hygiene.  We offered her an Acapella flutter valve however she did not want to use this.  We will place her on N-acetylcysteine 600 mg twice a day with meals.  This is an anti-inflammatory supplement that she can obtain  at any of the local health food stores.  It has been noted to be useful in patients with bronchiectasis.   We will see the patient in follow-up in 6 months time.  He is to contact us prior to that time should any new difficulties arise.

## 2019-04-02 ENCOUNTER — Encounter (INDEPENDENT_AMBULATORY_CARE_PROVIDER_SITE_OTHER): Payer: Medicare Other | Admitting: Ophthalmology

## 2019-04-03 ENCOUNTER — Other Ambulatory Visit: Payer: Self-pay | Admitting: Internal Medicine

## 2019-04-03 MED ORDER — METOPROLOL SUCCINATE ER 50 MG PO TB24: ORAL_TABLET | ORAL | 0 refills | Status: DC

## 2019-04-03 NOTE — Telephone Encounter (Signed)
Medication filled.  

## 2019-04-03 NOTE — Telephone Encounter (Signed)
Medication: metoprolol succinate (TOPROL-XL) 50 MG 24 hr tablet [[id number]]   Has the patient contacted their pharmacy? Yes (Agent: If no, request that the patient contact the pharmacy for the refill.) (Agent: If yes, when and what did the pharmacy advise?)  Preferred Pharmacy (with phone number or street name):[id number] - [place], [place] - [place] [phone] (Phone) [phone] (Fax)    Agent: Please be advised that RX refills may take up to 3 business days. We ask that you follow-up with your pharmacy.

## 2019-04-07 ENCOUNTER — Ambulatory Visit: Payer: Medicare Other | Admitting: Internal Medicine

## 2019-04-13 ENCOUNTER — Telehealth: Payer: Self-pay | Admitting: *Deleted

## 2019-04-13 NOTE — Telephone Encounter (Signed)
Copied from [place] [id number]. Topic: Quick Communication - See Telephone Encounter >> Apr 13, 2019  4:49 PM Loma [place] wrote: CRM for notification. See Telephone encounter for: 04/13/19. Husband is very ill and unable to walk. He is now at home on hospice. Alexa Pham is getting frantic and needs sometime of medicine to calm her down but she still wants to be alert and make decisions. Please call her at concerning this at [phone]

## 2019-04-14 ENCOUNTER — Encounter: Payer: Self-pay | Admitting: Internal Medicine

## 2019-04-14 ENCOUNTER — Other Ambulatory Visit: Payer: Self-pay

## 2019-04-14 ENCOUNTER — Ambulatory Visit (INDEPENDENT_AMBULATORY_CARE_PROVIDER_SITE_OTHER): Payer: Medicare Other | Admitting: Internal Medicine

## 2019-04-14 DIAGNOSIS — J479 Bronchiectasis, uncomplicated: Secondary | ICD-10-CM | POA: Diagnosis not present

## 2019-04-14 DIAGNOSIS — F439 Reaction to severe stress, unspecified: Secondary | ICD-10-CM

## 2019-04-14 MED ORDER — SERTRALINE HCL 25 MG PO TABS: 25.0000 mg | ORAL_TABLET | Freq: Every day | ORAL | 1 refills | Status: DC

## 2019-04-14 NOTE — Progress Notes (Signed)
[name], female   DOB: Sep 23, 1930, 85 y.o.   MRN: [id number]   Virtual Visit via telephone Note  This visit type was conducted due to national recommendations for restrictions regarding the COVID-19 pandemic (e.g. social distancing).  This format is felt to be most appropriate for this patient at this time.  All issues noted in this document were discussed and addressed.  No physical exam was performed (except for noted visual exam findings with Video Visits).   I connected with Alexa Pham by telephone and verified that I am speaking with the correct person using two identifiers. Location patient: home Location provider: work  Persons participating in the telephone visit: patient, provider  I discussed the limitations, risks, security and privacy concerns of performing an evaluation and management service by telephone and the availability of in person appointments.  The patient expressed understanding and agreed to proceed.   Reason for visit: acute visit.  HPI: Visit today to discussed increased stress and anxiety.  Husband is sick.  Hospice is involved.  He has been in the hospital.  Now home.  She does have some help from [facility].  She is eating.  No nausea or vomiting.  Bowels moving.  No chest pain or sob.  Trying to stay in due to covid restrictions.  No fever.  Seeing pulmonary - bronchiectasis.  Discussed the increased stress.  Feels she needs something to help level things out.  Denies depression.     ROS: See pertinent positives and negatives per HPI.  Past Medical History:  Diagnosis Date  . History of breast cancer   . History of pneumonia   . Hypertension   . Hypothyroidism   . Macular degeneration     Past Surgical History:  Procedure Laterality Date  . ABDOMINAL HYSTERECTOMY    . APPENDECTOMY    . BREAST LUMPECTOMY    . CATARACT EXTRACTION, BILATERAL    . CHOLECYSTECTOMY    . REPLACEMENT TOTAL KNEE    . TONSILLECTOMY AND ADENOIDECTOMY       Family History  Problem Relation Age of Onset  . CAD Mother     SOCIAL HX: reviewed.    Current Outpatient Medications:  .  amLODipine (NORVASC) 10 MG tablet, TAKE 1 TABLET(10 MG) BY MOUTH DAILY, Disp: 90 tablet, Rfl: 0 .  calcium citrate-vitamin D (CITRACAL+D) 315-200 MG-UNIT tablet, Take 1 tablet by mouth 2 (two) times daily., Disp: , Rfl:  .  irbesartan (AVAPRO) 75 MG tablet, Take 1 tablet (75 mg total) by mouth daily., Disp: 90 tablet, Rfl: 1 .  levothyroxine (SYNTHROID) 112 MCG tablet, TAKE 1 TABLET(112 MCG) BY MOUTH DAILY BEFORE BREAKFAST, Disp: 90 tablet, Rfl: 0 .  metoprolol succinate (TOPROL-XL) 50 MG 24 hr tablet, TAKE 1 TABLET BY MOUTH DAILY WITH OR IMMEDIATELY FOLLOWING A MEAL, Disp: 90 tablet, Rfl: 0 .  Multiple Vitamin (MULTIVITAMIN) capsule, Take 1 capsule by mouth daily., Disp: , Rfl:  .  Multiple Vitamins-Minerals (PRESERVISION AREDS 2 PO), Take by mouth., Disp: , Rfl:  .  sertraline (ZOLOFT) 25 MG tablet, Take 1 tablet (25 mg total) by mouth daily., Disp: 30 tablet, Rfl: 1  EXAM:  GENERAL: alert.  Sounds to be in no acute distress.  Answering questions appropriately.    PSYCH/NEURO: pleasant and cooperative, no obvious depression or anxiety, speech and thought processing grossly intact  ASSESSMENT AND PLAN:  Discussed the following assessment and plan:  Stress Increased stress and anxiety as outlined.  Discussed with her today.  Start zoloft 25mg  q day as directed.  Follow.  Schedule f/u soon to reassess.    Bronchiectasis without complication ([place]) Feels breathing is stable.      I discussed the assessment and treatment plan with the patient. The patient was provided an opportunity to ask questions and all were answered. The patient agreed with the plan and demonstrated an understanding of the instructions.   The patient was advised to call back or seek an in-person evaluation if the symptoms worsen or if the condition fails to improve as anticipated.  I  provided 13 minutes of non-face-to-face time during this encounter.   Einar Pheasant, MD

## 2019-04-19 ENCOUNTER — Encounter: Payer: Self-pay | Admitting: Internal Medicine

## 2019-04-19 DIAGNOSIS — F439 Reaction to severe stress, unspecified: Secondary | ICD-10-CM | POA: Insufficient documentation

## 2019-04-19 NOTE — Assessment & Plan Note (Signed)
Feels breathing is stable.

## 2019-04-19 NOTE — Assessment & Plan Note (Signed)
Increased stress and anxiety as outlined.  Discussed with her today.  Start zoloft 25mg  q day as directed.  Follow.  Schedule f/u soon to reassess.

## 2019-05-04 ENCOUNTER — Telehealth: Payer: Self-pay | Admitting: Internal Medicine

## 2019-05-04 NOTE — Telephone Encounter (Signed)
Patient said that Dr. Nicki Reaper had indicated to her son yesterday that she would be willing to increase med sertraline.  Pt stated that her reason for request is because her husband has been very sick and is on Hospice care.  Pt said that it has been hard on her and she is not handling it well.

## 2019-05-04 NOTE — Telephone Encounter (Signed)
LMTCB

## 2019-05-04 NOTE — Telephone Encounter (Signed)
I am ok to adjust dose.  Pt has appt with me 05/26/19.  See if she would be agreeable for appt tomorrow instead and we can discuss changes.  Schedule 12:00 appt.

## 2019-05-04 NOTE — Telephone Encounter (Signed)
Is this something that you spoke with her son about?

## 2019-05-04 NOTE — Telephone Encounter (Signed)
Pt called and stated that she would like to increase the medication sertraline  Pt would like a call back regarding.

## 2019-05-05 ENCOUNTER — Encounter: Payer: Self-pay | Admitting: Internal Medicine

## 2019-05-05 ENCOUNTER — Ambulatory Visit (INDEPENDENT_AMBULATORY_CARE_PROVIDER_SITE_OTHER): Payer: Medicare Other | Admitting: Internal Medicine

## 2019-05-05 ENCOUNTER — Other Ambulatory Visit: Payer: Self-pay

## 2019-05-05 DIAGNOSIS — R634 Abnormal weight loss: Secondary | ICD-10-CM

## 2019-05-05 DIAGNOSIS — J479 Bronchiectasis, uncomplicated: Secondary | ICD-10-CM

## 2019-05-05 DIAGNOSIS — E039 Hypothyroidism, unspecified: Secondary | ICD-10-CM | POA: Diagnosis not present

## 2019-05-05 DIAGNOSIS — F439 Reaction to severe stress, unspecified: Secondary | ICD-10-CM

## 2019-05-05 DIAGNOSIS — I10 Essential (primary) hypertension: Secondary | ICD-10-CM | POA: Diagnosis not present

## 2019-05-05 MED ORDER — SERTRALINE HCL 50 MG PO TABS: 50.0000 mg | ORAL_TABLET | Freq: Every day | ORAL | 2 refills | Status: DC

## 2019-05-05 NOTE — Progress Notes (Signed)
[name], female   DOB: 01-04-1930, 85 y.o.   MRN: [id number]   Virtual Visit via telephone Note  This visit type was conducted due to national recommendations for restrictions regarding the COVID-19 pandemic (e.g. social distancing).  This format is felt to be most appropriate for this patient at this time.  All issues noted in this document were discussed and addressed.  No physical exam was performed (except for noted visual exam findings with Video Visits).   I connected with [name] by telephone and verified that I am speaking with the correct person using two identifiers. Location patient: home Location provider: work Persons participating in the telephone visit: patient, provider  I discussed the limitations, risks, security and privacy concerns of performing an evaluation and management service by telephone and the availability of in person appointments. The patient expressed understanding and agreed to proceed.   Reason for visit: work in appt  HPI: Recently evaluated for increased stress and anxiety related to her husband's medical issues.  Hospice is involved.  She was started on zoloft.  Feels has helped some, but still with increased stress.  Her husband is doing some better, but still increased strain on her trying to manage things around the house.  She is eating.  No vomiting.  Sleeping.  No cough, congestion or sob.  Discussed with her regarding increasing zoloft, since the feels has helped.  She is in agreement.     ROS: See pertinent positives and negatives per HPI.  Past Medical History:  Diagnosis Date  . History of breast cancer   . History of pneumonia   . Hypertension   . Hypothyroidism   . Macular degeneration     Past Surgical History:  Procedure Laterality Date  . ABDOMINAL HYSTERECTOMY    . APPENDECTOMY    . BREAST LUMPECTOMY    . CATARACT EXTRACTION, BILATERAL    . CHOLECYSTECTOMY    . REPLACEMENT TOTAL KNEE    . TONSILLECTOMY  AND ADENOIDECTOMY      Family History  Problem Relation Age of Onset  . CAD Mother     SOCIAL HX: reviewed.    Current Outpatient Medications:  .  amLODipine (NORVASC) 10 MG tablet, TAKE 1 TABLET(10 MG) BY MOUTH DAILY, Disp: 90 tablet, Rfl: 1 .  calcium citrate-vitamin D (CITRACAL+D) 315-200 MG-UNIT tablet, Take 1 tablet by mouth 2 (two) times daily., Disp: , Rfl:  .  irbesartan (AVAPRO) 75 MG tablet, Take 1 tablet (75 mg total) by mouth daily., Disp: 90 tablet, Rfl: 1 .  levothyroxine (SYNTHROID) 112 MCG tablet, TAKE 1 TABLET(112 MCG) BY MOUTH DAILY BEFORE BREAKFAST, Disp: 90 tablet, Rfl: 0 .  metoprolol succinate (TOPROL-XL) 50 MG 24 hr tablet, TAKE 1 TABLET BY MOUTH DAILY WITH OR IMMEDIATELY FOLLOWING A MEAL, Disp: 90 tablet, Rfl: 0 .  Multiple Vitamin (MULTIVITAMIN) capsule, Take 1 capsule by mouth daily., Disp: , Rfl:  .  Multiple Vitamins-Minerals (PRESERVISION AREDS 2 PO), Take by mouth., Disp: , Rfl:  .  sertraline (ZOLOFT) 50 MG tablet, Take 1 tablet (50 mg total) by mouth daily., Disp: 30 tablet, Rfl: 2  EXAM:  VITALS per patient if applicable:  Weight 790 pounds.    GENERAL: alert.  Sounds to be in no acute distress. Answering questions appropriately.    PSYCH/NEURO: pleasant and cooperative, no obvious depression or anxiety, speech and thought processing grossly intact  ASSESSMENT AND PLAN:  Discussed the following assessment and plan:  Stress Increased stress and anxiety  as outlined.  Discussed with her today.  She is doing some better.  Feels zoloft has helped, but still with increased stress.  Will increase zoloft to 50mg  q day.  Follow closely.    Hypothyroidism On thyroid replacement.  Follow tsh.   Hypertension Blood pressure has been under good control.  Follow.    Bronchiectasis without complication ([facility]) Breathing stable.  Evaluated by pulmonary.   Weight loss Per her report, weight is down several pounds from her last check here in the office.   Encourage increase po intake.  Follow weight.     I discussed the assessment and treatment plan with the patient. The patient was provided an opportunity to ask questions and all were answered. The patient agreed with the plan and demonstrated an understanding of the instructions.   The patient was advised to call back or seek an in-person evaluation if the symptoms worsen or if the condition fails to improve as anticipated.  I provided 15 minutes of non-face-to-face time during this encounter.   Einar Pheasant, MD

## 2019-05-05 NOTE — Telephone Encounter (Signed)
Patient called regarding her medication dosage change.  Asked patient if she would be able to come in today at 12:00.  Patient agreed.  Please call patient to confirm appt.  CB# [phone]

## 2019-05-05 NOTE — Telephone Encounter (Signed)
Spoke w/ pt to confirm.  Pt ok to have phone visit today at 12 noon.  Please call home number at [phone].

## 2019-05-05 NOTE — Telephone Encounter (Signed)
Pt scheduled for phone visit today at 72

## 2019-05-07 ENCOUNTER — Ambulatory Visit: Payer: Medicare Other | Admitting: Internal Medicine

## 2019-05-08 ENCOUNTER — Other Ambulatory Visit: Payer: Self-pay | Admitting: Internal Medicine

## 2019-05-08 MED ORDER — AMLODIPINE BESYLATE 10 MG PO TABS: ORAL_TABLET | ORAL | 1 refills | Status: DC

## 2019-05-08 NOTE — Telephone Encounter (Signed)
Medication filled.  

## 2019-05-08 NOTE — Telephone Encounter (Signed)
Pt is calling in a refill for amLODipine (NORVASC) 10 MG tablet  Pharmacy:  [facility] [place], [place] [phone] (Phone) [phone] (Fax)

## 2019-05-10 ENCOUNTER — Encounter: Payer: Self-pay | Admitting: Internal Medicine

## 2019-05-10 DIAGNOSIS — R634 Abnormal weight loss: Secondary | ICD-10-CM | POA: Insufficient documentation

## 2019-05-10 NOTE — Assessment & Plan Note (Signed)
Blood pressure has been under good control.  Follow.

## 2019-05-10 NOTE — Assessment & Plan Note (Signed)
Breathing stable.  Evaluated by pulmonary.   

## 2019-05-10 NOTE — Assessment & Plan Note (Signed)
On thyroid replacement.  Follow tsh.  

## 2019-05-10 NOTE — Assessment & Plan Note (Signed)
Per her report, weight is down several pounds from her last check here in the office.  Encourage increase po intake.  Follow weight.

## 2019-05-10 NOTE — Assessment & Plan Note (Signed)
Increased stress and anxiety as outlined.  Discussed with her today.  She is doing some better.  Feels zoloft has helped, but still with increased stress.  Will increase zoloft to 50mg  q day.  Follow closely.

## 2019-05-25 ENCOUNTER — Other Ambulatory Visit: Payer: Self-pay | Admitting: Internal Medicine

## 2019-05-25 DIAGNOSIS — E039 Hypothyroidism, unspecified: Secondary | ICD-10-CM

## 2019-05-25 MED ORDER — LEVOTHYROXINE SODIUM 112 MCG PO TABS: ORAL_TABLET | ORAL | 0 refills | Status: DC

## 2019-05-25 NOTE — Telephone Encounter (Signed)
Medication: levothyroxine (SYNTHROID) 112 MCG tablet [[id number]] ,   Has the patient contacted their pharmacy? Yes  (Agent: If no, request that the patient contact the pharmacy for the refill.) (Agent: If yes, when and what did the pharmacy advise?)  Preferred Pharmacy (with phone number or street name): [id number] - [place], [place] - [place] [phone] (Phone) [phone] (Fax)    Agent: Please be advised that RX refills may take up to 3 business days. We ask that you follow-up with your pharmacy.

## 2019-05-26 ENCOUNTER — Ambulatory Visit: Payer: Medicare Other | Admitting: Internal Medicine

## 2019-07-02 ENCOUNTER — Other Ambulatory Visit: Payer: Self-pay | Admitting: Internal Medicine

## 2019-07-02 MED ORDER — METOPROLOL SUCCINATE ER 50 MG PO TB24: ORAL_TABLET | ORAL | 0 refills | Status: DC

## 2019-07-02 NOTE — Telephone Encounter (Signed)
Medication Refill - Medication: metoprolol succinate (TOPROL-XL) 50 MG 24 hr tablet  Has the patient contacted their pharmacy? Yes.   (Agent: If no, request that the patient contact the pharmacy for the refill.) (Agent: If yes, when and what did the pharmacy advise?)  Preferred Pharmacy (with phone number or street name): [id number] - [place], [place] - [address]  Agent: Please be advised that RX refills may take up to 3 business days. We ask that you follow-up with your pharmacy.

## 2019-07-07 ENCOUNTER — Other Ambulatory Visit: Payer: Self-pay

## 2019-07-07 ENCOUNTER — Ambulatory Visit (INDEPENDENT_AMBULATORY_CARE_PROVIDER_SITE_OTHER): Payer: Medicare Other | Admitting: Internal Medicine

## 2019-07-07 DIAGNOSIS — R634 Abnormal weight loss: Secondary | ICD-10-CM

## 2019-07-07 DIAGNOSIS — I10 Essential (primary) hypertension: Secondary | ICD-10-CM

## 2019-07-07 DIAGNOSIS — J479 Bronchiectasis, uncomplicated: Secondary | ICD-10-CM

## 2019-07-07 DIAGNOSIS — F439 Reaction to severe stress, unspecified: Secondary | ICD-10-CM

## 2019-07-07 DIAGNOSIS — E7800 Pure hypercholesterolemia, unspecified: Secondary | ICD-10-CM

## 2019-07-07 DIAGNOSIS — R9389 Abnormal findings on diagnostic imaging of other specified body structures: Secondary | ICD-10-CM

## 2019-07-07 DIAGNOSIS — I700 Atherosclerosis of aorta: Secondary | ICD-10-CM

## 2019-07-07 DIAGNOSIS — E039 Hypothyroidism, unspecified: Secondary | ICD-10-CM | POA: Diagnosis not present

## 2019-07-07 DIAGNOSIS — Z853 Personal history of malignant neoplasm of breast: Secondary | ICD-10-CM

## 2019-07-07 MED ORDER — SERTRALINE HCL 50 MG PO TABS: 50.0000 mg | ORAL_TABLET | Freq: Every day | ORAL | 2 refills | Status: DC

## 2019-07-07 MED ORDER — METOPROLOL SUCCINATE ER 50 MG PO TB24: ORAL_TABLET | ORAL | 1 refills | Status: DC

## 2019-07-07 MED ORDER — AMLODIPINE BESYLATE 10 MG PO TABS: ORAL_TABLET | ORAL | 1 refills | Status: DC

## 2019-07-07 MED ORDER — LEVOTHYROXINE SODIUM 112 MCG PO TABS: ORAL_TABLET | ORAL | 1 refills | Status: DC

## 2019-07-07 MED ORDER — IRBESARTAN 75 MG PO TABS: 75.0000 mg | ORAL_TABLET | Freq: Every day | ORAL | 1 refills | Status: DC

## 2019-07-07 NOTE — Progress Notes (Signed)
[name], female   DOB: August 11, 1930, 85 y.o.   MRN: [id number]   Virtual Visit via telephone Note  This visit type was conducted due to national recommendations for restrictions regarding the COVID-19 pandemic (e.g. social distancing).  This format is felt to be most appropriate for this patient at this time.  All issues noted in this document were discussed and addressed.  No physical exam was performed (except for noted visual exam findings with Video Visits).   I connected with [name] by telephone and verified that I am speaking with the correct person using two identifiers. Location patient: home Location provider: work  Persons participating in the telephone visit: patient, provider  I discussed the limitations, risks, security and privacy concerns of performing an evaluation and management service by telephone and the availability of in person appointments.  The patient expressed understanding and agreed to proceed.   Reason for visit: scheduled follow up.   HPI: Being seen for f/u - increased stress.  Was started on zoloft.  Does increased to 57m last visit.  Doing well on this dose.  Dose feel better.  Husband is doing some better.  Hospice no longer coming.  She does have an aid for 8 hours per day.  He has dementia.  Increased stress related to this.  She stays active.  No chest pain.  No sob.  No acid reflux.  No abdominal pain.  Bowels moving.  Blood pressures averaging 120-130/70s.  Discussed shingrix.    ROS: See pertinent positives and negatives per HPI.  Past Medical History:  Diagnosis Date  . History of breast cancer   . History of pneumonia   . Hypertension   . Hypothyroidism   . Macular degeneration     Past Surgical History:  Procedure Laterality Date  . ABDOMINAL HYSTERECTOMY    . APPENDECTOMY    . BREAST LUMPECTOMY    . CATARACT EXTRACTION, BILATERAL    . CHOLECYSTECTOMY    . REPLACEMENT TOTAL KNEE    . TONSILLECTOMY AND ADENOIDECTOMY       Family History  Problem Relation Age of Onset  . CAD Mother     SOCIAL HX: reviewed.    Current Outpatient Medications:  .  amLODipine (NORVASC) 10 MG tablet, TAKE 1 TABLET(10 MG) BY MOUTH DAILY, Disp: 90 tablet, Rfl: 1 .  calcium citrate-vitamin D (CITRACAL+D) 315-200 MG-UNIT tablet, Take 1 tablet by mouth 2 (two) times daily., Disp: , Rfl:  .  irbesartan (AVAPRO) 75 MG tablet, Take 1 tablet (75 mg total) by mouth daily., Disp: 90 tablet, Rfl: 1 .  levothyroxine (SYNTHROID) 112 MCG tablet, TAKE 1 TABLET(112 MCG) BY MOUTH DAILY BEFORE BREAKFAST, Disp: 90 tablet, Rfl: 1 .  metoprolol succinate (TOPROL-XL) 50 MG 24 hr tablet, TAKE 1 TABLET BY MOUTH DAILY WITH OR IMMEDIATELY FOLLOWING A MEAL, Disp: 90 tablet, Rfl: 1 .  Multiple Vitamin (MULTIVITAMIN) capsule, Take 1 capsule by mouth daily., Disp: , Rfl:  .  Multiple Vitamins-Minerals (PRESERVISION AREDS 2 PO), Take by mouth., Disp: , Rfl:  .  sertraline (ZOLOFT) 50 MG tablet, Take 1 tablet (50 mg total) by mouth daily., Disp: 30 tablet, Rfl: 2  EXAM:  VITALS per patient if applicable:  1846-659/[id number]    GENERAL: alert.  Sounds to be in no acute distress.  Answering questions appropriately.    PSYCH/NEURO: pleasant and cooperative, no obvious depression or anxiety, speech and thought processing grossly intact  ASSESSMENT AND PLAN:  Discussed the following assessment and  plan:  Abnormal chest CT Seeing pulmonary.  Last CT 03/2019 - c/w MAC per pulmonary review.  Breathing stable.    Aortic atherosclerosis (HCC) Pt declines statin medication.  Follow.   Bronchiectasis without complication (HCC) Breathing stable.  Followed by pulmonary.    History of breast cancer S/p lumpectomy.  Declines f/u mammogram.    Hypercholesterolemia Declines cholesterol medication.  Follow lipid panel.  Hypertension Blood pressure has been doing well.  Follow pressures.  Follow metabolic panel.    Hypothyroidism On thyroid replacement.   Follow tsh.   Stress Increased stress as outlined.  Overall doing better.  Continue zoloft.   Weight loss Weight stable.  Reports eating well.  Follow.     I discussed the assessment and treatment plan with the patient. The patient was provided an opportunity to ask questions and all were answered. The patient agreed with the plan and demonstrated an understanding of the instructions.   The patient was advised to call back or seek an in-person evaluation if the symptoms worsen or if the condition fails to improve as anticipated.  I provided 12 minutes of non-face-to-face time during this encounter.   Einar Pheasant, MD

## 2019-07-12 ENCOUNTER — Encounter: Payer: Self-pay | Admitting: Internal Medicine

## 2019-07-12 NOTE — Assessment & Plan Note (Signed)
On thyroid replacement.  Follow tsh.  

## 2019-07-12 NOTE — Assessment & Plan Note (Signed)
S/p lumpectomy.  Declines f/u mammogram.

## 2019-07-12 NOTE — Assessment & Plan Note (Signed)
Seeing pulmonary.  Last CT 03/2019 - c/w MAC per pulmonary review.  Breathing stable.

## 2019-07-12 NOTE — Assessment & Plan Note (Signed)
Breathing stable.  Followed by pulmonary.  

## 2019-07-12 NOTE — Assessment & Plan Note (Signed)
Blood pressure has been doing well.  Follow pressures.  Follow metabolic panel.   

## 2019-07-12 NOTE — Assessment & Plan Note (Signed)
Increased stress as outlined.  Overall doing better.  Continue zoloft.

## 2019-07-12 NOTE — Assessment & Plan Note (Signed)
Declines cholesterol medication.  Follow lipid panel.  

## 2019-07-12 NOTE — Assessment & Plan Note (Signed)
Pt declines statin medication.  Follow.

## 2019-07-12 NOTE — Assessment & Plan Note (Signed)
Weight stable.  Reports eating well.  Follow.

## 2019-08-20 ENCOUNTER — Ambulatory Visit (INDEPENDENT_AMBULATORY_CARE_PROVIDER_SITE_OTHER): Payer: Medicare Other

## 2019-08-20 ENCOUNTER — Other Ambulatory Visit: Payer: Self-pay

## 2019-08-20 DIAGNOSIS — Z0000 Encounter for general adult medical examination without abnormal findings: Secondary | ICD-10-CM

## 2019-08-20 NOTE — Patient Instructions (Addendum)
  Alexa Pham , Thank you for taking time to come for your Medicare Wellness Visit. I appreciate your ongoing commitment to your health goals. Please review the following plan we discussed and let me know if I can assist you in the future.   These are the goals we discussed: Goals      Patient Stated   . Increase physical activity (pt-stated)     I need to walk more for exercise and drink more water       This is a list of the screening recommended for you and due dates:  Health Maintenance  Topic Date Due  . Tetanus Vaccine  11/09/2024  . Flu Shot  Completed  . DEXA scan (bone density measurement)  Completed  . Pneumonia vaccines  Completed

## 2019-08-20 NOTE — Progress Notes (Addendum)
Subjective:   Shaunika Richberg is a 85 y.o. female who presents for an Initial Medicare Annual Wellness Visit.  Review of Systems    No ROS.  Medicare Wellness Virtual Visit.  Visual/audio telehealth visit, UTA vital signs.   See social history for additional risk factors.    Cardiac Risk Factors include: advanced age (>69men, >84 women);hypertension     Objective:    Today's Vitals   There is no height or weight on file to calculate BMI.  Advanced Directives 08/20/2019  Does Patient Have a Medical Advance Directive? Yes  Type of Paramedic of [place];Living will  Does patient want to make changes to medical advance directive? No - Patient declined    Current Medications (verified) Outpatient Encounter Medications as of 08/20/2019  Medication Sig   amLODipine (NORVASC) 10 MG tablet TAKE 1 TABLET(10 MG) BY MOUTH DAILY   calcium citrate-vitamin D (CITRACAL+D) 315-200 MG-UNIT tablet Take 1 tablet by mouth 2 (two) times daily.   irbesartan (AVAPRO) 75 MG tablet Take 1 tablet (75 mg total) by mouth daily.   levothyroxine (SYNTHROID) 112 MCG tablet TAKE 1 TABLET(112 MCG) BY MOUTH DAILY BEFORE BREAKFAST   metoprolol succinate (TOPROL-XL) 50 MG 24 hr tablet TAKE 1 TABLET BY MOUTH DAILY WITH OR IMMEDIATELY FOLLOWING A MEAL   Multiple Vitamin (MULTIVITAMIN) capsule Take 1 capsule by mouth daily.   Multiple Vitamins-Minerals (PRESERVISION AREDS 2 PO) Take by mouth.   sertraline (ZOLOFT) 50 MG tablet Take 1 tablet (50 mg total) by mouth daily.   No facility-administered encounter medications on file as of 08/20/2019.     Allergies (verified) Patient has no known allergies.   History: Past Medical History:  Diagnosis Date   History of breast cancer    History of pneumonia    Hypertension    Hypothyroidism    Macular degeneration    Past Surgical History:  Procedure Laterality Date   ABDOMINAL HYSTERECTOMY     APPENDECTOMY     BREAST  LUMPECTOMY     CATARACT EXTRACTION, BILATERAL     CHOLECYSTECTOMY     REPLACEMENT TOTAL KNEE     TONSILLECTOMY AND ADENOIDECTOMY     Family History  Problem Relation Age of Onset   CAD Mother    Social History   Socioeconomic History   Marital status: Married    Spouse name: Not on file   Number of children: Not on file   Years of education: Not on file   Highest education level: Not on file  Occupational History   Not on file  Social Needs   Financial resource strain: Not hard at all   Food insecurity    Worry: Never true    Inability: Never true   Transportation needs    Medical: No    Non-medical: No  Tobacco Use   Smoking status: Former Smoker    Packs/day: 1.50    Years: 6.00    Pack years: 9.00    Types: Cigarettes    Quit date: 1975    Years since quitting: 45.8   Smokeless tobacco: Never Used  Substance and Sexual Activity   Alcohol use: No    Alcohol/week: 0.0 standard drinks   Drug use: No   Sexual activity: Not on file  Lifestyle   Physical activity    Days per week: 0 days    Minutes per session: Not on file   Stress: Not at all  Relationships   Social connections    Talks  on phone: Not on file    Gets together: Not on file    Attends religious service: Not on file    Active member of club or organization: Not on file    Attends meetings of clubs or organizations: Not on file    Relationship status: Not on file  Other Topics Concern   Not on file  Social History Narrative   Married     Tobacco Counseling Counseling given: Not Answered   Clinical Intake:  Pre-visit preparation completed: Yes        Diabetes: No  How often do you need to have someone help you when you read instructions, pamphlets, or other written materials from your doctor or pharmacy?: 1 - Never  Interpreter Needed?: No      Activities of Daily Living In your present state of health, do you have any difficulty performing the following  activities: 08/20/2019  Hearing? Y  Comment Hearing aids  Vision? N  Comment Age related macular  Difficulty concentrating or making decisions? N  Walking or climbing stairs? N  Dressing or bathing? N  Doing errands, shopping? N  Preparing Food and eating ? N  Using the Toilet? N  In the past six months, have you accidently leaked urine? Y  Comment Managed with daily brief  Do you have problems with loss of bowel control? N  Managing your Medications? N  Managing your Finances? Y  Comment Son Writer or managing your Housekeeping? Y  Comment Maid assist  Some recent data might be hidden     Immunizations and Health Maintenance Immunization History  Administered Date(s) Administered   Fluad Quad(high Dose 65+) 06/14/2019   Influenza, High Dose Seasonal PF 07/04/2015, 07/02/2017, 07/03/2018   Influenza-Unspecified 06/22/2016   Pneumococcal Conjugate-13 04/21/2016   Pneumococcal Polysaccharide-23 05/05/2012   Tdap 11/09/2014   There are no preventive care reminders to display for this patient.  Patient Care Team: Einar Pheasant, MD as PCP - General (Internal Medicine)  Indicate any recent Medical Services you may have received from other than [facility] providers in the past year (date may be approximate).     Assessment:   This is a routine wellness examination for Dayli.  Nurse connected with patient 08/20/19 at 11:00 AM EST by a telephone enabled telemedicine application and verified that I am speaking with the correct person using two identifiers. Patient stated full name and DOB. Patient gave permission to continue with virtual visit. Patient's location was at home and Nurse's location was at [place] office.   Health Maintenance Due: See completed HM at the end of note.   Eye: Visual acuity not assessed. Virtual visit. Wears corrective lenses. Followed by their ophthalmologist. Age related macular.   Dental: Visits every 6 months.     Hearing: Hearing aids- yes  Safety:  Patient feels safe at home- yes Patient does have smoke detectors at home- yes Patient does wear sunscreen or protective clothing when in direct sunlight - yes Patient does wear seat belt when in a moving vehicle - yes Patient drives- yes Adequate lighting in walkways free from debris- yes Grab bars and handrails used as appropriate- yes Ambulates with no assistive device Cell phone on person when ambulating outside of the home- yes  Social: Alcohol intake - no       Smoking history- former    Smokers in home? none Illicit drug use? none  Depression: PHQ 2 &9 complete. See screening below. Denies irritability, anhedonia, sadness/tearfullness.  Stable.  Taking medication as directed.   Falls: See screening below.    Medication: Taking as directed and without issues.   Covid-19: Precautions and sickness symptoms discussed. Wears mask, social distancing, hand hygiene as appropriate.   Activities of Daily Living Patient denies needing assistance with: household chores, feeding themselves, getting from bed to chair, getting to the toilet, bathing/showering, dressing, managing money, or preparing meals.   Memory: Patient is alert. Patient denies difficulty focusing or concentrating. Correctly identified the president of the [place], season and recall. Patient likes to read and play words with friends for brain stimulation.   BMI- discussed the importance of a healthy diet, water intake and the benefits of aerobic exercise.  Educational material provided.  Physical activity- walking, no routine.   Diet:  Regular Water: fair intake  Other Providers Patient Care Team: Einar Pheasant, MD as PCP - General (Internal Medicine)  Hearing/Vision screen  Hearing Screening   125Hz  250Hz  500Hz  1000Hz  2000Hz  3000Hz  4000Hz  6000Hz  8000Hz   Right ear:           Left ear:           Comments: Hearing aids  Vision Screening Comments: Wears  corrective lenses Macular- age related Visual acuity not assessed, virtual visit.     Dietary issues and exercise activities discussed: Current Exercise Habits: Home exercise routine, Type of exercise: walking, Intensity: Mild  Goals      Patient Stated    Increase physical activity (pt-stated)     I need to walk more for exercise and drink more water      Depression Screen PHQ 2/9 Scores 08/20/2019 07/07/2019  PHQ - 2 Score 0 0    Fall Risk Fall Risk  08/20/2019 07/07/2019  Falls in the past year? 0 0  Risk for fall due to : - Impaired vision  Follow up - Falls evaluation completed   Timed Get Up and Go Performed: no, virtually visit  Cognitive Function:     6CIT Screen 08/20/2019  What Year? 0 points  What month? 0 points  What time? 0 points  Count back from 20 0 points  Months in reverse 0 points  Repeat phrase 0 points  Total Score 0    Screening Tests Health Maintenance  Topic Date Due   TETANUS/TDAP  11/09/2024   INFLUENZA VACCINE  Completed   DEXA SCAN  Completed   PNA vac Low Risk Adult  Completed     Plan:   Keep all routine maintenance appointments.   Follow up scheduled 10/19/19 @ 2:00.  Medicare Attestation I have personally reviewed: The patient's medical and social history Their use of alcohol, tobacco or illicit drugs Their current medications and supplements The patient's functional ability including ADLs,fall risks, home safety risks, cognitive, and hearing and visual impairment Diet and physical activities Evidence for depression   In addition, I have reviewed and discussed with patient certain preventive protocols, quality metrics, and best practice recommendations. A written personalized care plan for preventive services as well as general preventive health recommendations were provided to patient via mail.     Varney Biles, LPN   [phone]    Reviewed above information.  Agree with assessment and plan.   Dr  Nicki Reaper

## 2019-10-19 ENCOUNTER — Other Ambulatory Visit: Payer: Self-pay

## 2019-10-19 ENCOUNTER — Encounter: Payer: Self-pay | Admitting: Internal Medicine

## 2019-10-19 ENCOUNTER — Telehealth: Payer: Self-pay | Admitting: Internal Medicine

## 2019-10-19 ENCOUNTER — Ambulatory Visit (INDEPENDENT_AMBULATORY_CARE_PROVIDER_SITE_OTHER): Payer: Medicare Other | Admitting: Internal Medicine

## 2019-10-19 DIAGNOSIS — J479 Bronchiectasis, uncomplicated: Secondary | ICD-10-CM | POA: Diagnosis not present

## 2019-10-19 DIAGNOSIS — E039 Hypothyroidism, unspecified: Secondary | ICD-10-CM

## 2019-10-19 DIAGNOSIS — Z853 Personal history of malignant neoplasm of breast: Secondary | ICD-10-CM

## 2019-10-19 DIAGNOSIS — I10 Essential (primary) hypertension: Secondary | ICD-10-CM

## 2019-10-19 DIAGNOSIS — F439 Reaction to severe stress, unspecified: Secondary | ICD-10-CM

## 2019-10-19 DIAGNOSIS — I700 Atherosclerosis of aorta: Secondary | ICD-10-CM

## 2019-10-19 DIAGNOSIS — E7800 Pure hypercholesterolemia, unspecified: Secondary | ICD-10-CM

## 2019-10-19 NOTE — Assessment & Plan Note (Signed)
Declines cholesterol medication.  Follow lipid panel.  Due labs.

## 2019-10-19 NOTE — Assessment & Plan Note (Signed)
Blood pressure elevated today.  Checks prior as outlined.  Will spot check her pressure over the next couple of weeks.  Will call in readings.  If persistent elevation, will adjust dose of avapro.  Check metabolic panel.

## 2019-10-19 NOTE — Assessment & Plan Note (Signed)
Increased stress as outlined.  On zoloft.  Feels this is working well for her.  Follow.  Does not feel needs any further intervention.

## 2019-10-19 NOTE — Assessment & Plan Note (Signed)
Declines statin medication.   

## 2019-10-19 NOTE — Progress Notes (Signed)
[name], female   DOB: 11/08/1929, 86 y.o.   MRN: [id number]   Virtual Visit via telephone Note  This visit type was conducted due to national recommendations for restrictions regarding the COVID-19 pandemic (e.g. social distancing).  This format is felt to be most appropriate for this patient at this time.  All issues noted in this document were discussed and addressed.  No physical exam was performed (except for noted visual exam findings with Video Visits).   I connected with Alexa Pham by telephone and verified that I am speaking with the correct person using two identifiers. Location patient: home Location provider: work  Persons participating in the telephone visit: patient, provider  I discussed the limitations, risks, security and privacy concerns of performing an evaluation and management service by telephone and the availability of in person appointments.  The patient expressed understanding and agreed to proceed.   Reason for visit: scheduled follow up.    HPI: She reports she feels good.  Increased stress with her husband's health issues. Discussed with her today.  In the process of getting new caretakers.  She has been busy today dealing with this.  Feels that is why her blood pressure is elevated today.  Previous blood pressure checks:  147/68 and 136/80.  No chest pain.  No sob.  Eating.  No nausea or vomiting.  Bowels moving.  Stays active.  Taking zoloft.  Feels this is working well for her.  Had questions about covid vaccine.  Declines mammogram.     ROS: See pertinent positives and negatives per HPI.  Past Medical History:  Diagnosis Date  . History of breast cancer   . History of pneumonia   . Hypertension   . Hypothyroidism   . Macular degeneration     Past Surgical History:  Procedure Laterality Date  . ABDOMINAL HYSTERECTOMY    . APPENDECTOMY    . BREAST LUMPECTOMY    . CATARACT EXTRACTION, BILATERAL    . CHOLECYSTECTOMY    . REPLACEMENT  TOTAL KNEE    . TONSILLECTOMY AND ADENOIDECTOMY      Family History  Problem Relation Age of Onset  . CAD Mother     SOCIAL HX: reviewed.    Current Outpatient Medications:  .  amLODipine (NORVASC) 10 MG tablet, TAKE 1 TABLET(10 MG) BY MOUTH DAILY, Disp: 90 tablet, Rfl: 1 .  calcium citrate-vitamin D (CITRACAL+D) 315-200 MG-UNIT tablet, Take 1 tablet by mouth 2 (two) times daily., Disp: , Rfl:  .  irbesartan (AVAPRO) 75 MG tablet, Take 1 tablet (75 mg total) by mouth daily., Disp: 90 tablet, Rfl: 1 .  levothyroxine (SYNTHROID) 112 MCG tablet, TAKE 1 TABLET(112 MCG) BY MOUTH DAILY BEFORE BREAKFAST, Disp: 90 tablet, Rfl: 1 .  metoprolol succinate (TOPROL-XL) 50 MG 24 hr tablet, TAKE 1 TABLET BY MOUTH DAILY WITH OR IMMEDIATELY FOLLOWING A MEAL, Disp: 90 tablet, Rfl: 1 .  Multiple Vitamin (MULTIVITAMIN) capsule, Take 1 capsule by mouth daily., Disp: , Rfl:  .  Multiple Vitamins-Minerals (PRESERVISION AREDS 2 PO), Take by mouth., Disp: , Rfl:  .  sertraline (ZOLOFT) 50 MG tablet, Take 1 tablet (50 mg total) by mouth daily., Disp: 30 tablet, Rfl: 2  EXAM:  VITALS per patient if applicable: 0000000  GENERAL: alert.  Sounds to be in no acute distress.  Answering qeutions appropriately.   PSYCH/NEURO: pleasant and cooperative, no obvious depression or anxiety, speech and thought processing grossly intact  ASSESSMENT AND PLAN:  Discussed the following assessment  and plan:  Aortic atherosclerosis (HCC) Declines statin medication.   Bronchiectasis without complication ([place]) Has been evaluated by pulmonary.  Breathing stable.   Hypercholesterolemia Declines cholesterol medication.  Follow lipid panel.  Due labs.    History of breast cancer S/p lumpectomy.  Declines f/u mammogram.    Hypertension Blood pressure elevated today.  Checks prior as outlined.  Will spot check her pressure over the next couple of weeks.  Will call in readings.  If persistent elevation, will adjust dose of  avapro.  Check metabolic panel.    Hypothyroidism On thyroid replacement.  Follow tsh.    Stress Increased stress as outlined.  On zoloft.  Feels this is working well for her.  Follow.  Does not feel needs any further intervention.      I discussed the assessment and treatment plan with the patient. The patient was provided an opportunity to ask questions and all were answered. The patient agreed with the plan and demonstrated an understanding of the instructions.   The patient was advised to call back or seek an in-person evaluation if the symptoms worsen or if the condition fails to improve as anticipated.  I provided 15 minutes of non-face-to-face time during this encounter.   Einar Pheasant, MD

## 2019-10-19 NOTE — Telephone Encounter (Signed)
Lm for pt to call back and schedule fasting labs in 3-4 weeks and a 67m follow up/ac

## 2019-10-19 NOTE — Assessment & Plan Note (Signed)
S/p lumpectomy.  Declines f/u mammogram.

## 2019-10-19 NOTE — Assessment & Plan Note (Signed)
Has been evaluated by pulmonary.  Breathing stable.  

## 2019-10-19 NOTE — Assessment & Plan Note (Signed)
On thyroid replacement.  Follow tsh.  

## 2019-11-02 ENCOUNTER — Encounter (INDEPENDENT_AMBULATORY_CARE_PROVIDER_SITE_OTHER): Payer: Medicare Other | Admitting: Ophthalmology

## 2019-11-09 ENCOUNTER — Other Ambulatory Visit: Payer: Medicare Other

## 2019-11-12 ENCOUNTER — Other Ambulatory Visit: Payer: Self-pay

## 2019-11-12 NOTE — Telephone Encounter (Signed)
Refilled: 07/07/2019 Last OV: 10/19/2019 Next OV: 01/19/2020 Last TSH: 11/28/2017

## 2019-11-13 MED ORDER — LEVOTHYROXINE SODIUM 112 MCG PO TABS: ORAL_TABLET | ORAL | 0 refills | Status: DC

## 2019-11-13 NOTE — Telephone Encounter (Signed)
rx sent in for synthroid.  Has labs scheduled for 11/23/19.

## 2019-11-16 ENCOUNTER — Other Ambulatory Visit: Payer: Self-pay

## 2019-11-16 ENCOUNTER — Encounter (INDEPENDENT_AMBULATORY_CARE_PROVIDER_SITE_OTHER): Payer: Medicare Other | Admitting: Ophthalmology

## 2019-11-16 DIAGNOSIS — H35033 Hypertensive retinopathy, bilateral: Secondary | ICD-10-CM | POA: Diagnosis not present

## 2019-11-16 DIAGNOSIS — I10 Essential (primary) hypertension: Secondary | ICD-10-CM

## 2019-11-16 DIAGNOSIS — H43813 Vitreous degeneration, bilateral: Secondary | ICD-10-CM

## 2019-11-16 DIAGNOSIS — H353121 Nonexudative age-related macular degeneration, left eye, early dry stage: Secondary | ICD-10-CM

## 2019-11-16 DIAGNOSIS — H35372 Puckering of macula, left eye: Secondary | ICD-10-CM | POA: Diagnosis not present

## 2019-11-23 ENCOUNTER — Other Ambulatory Visit: Payer: Self-pay

## 2019-11-23 ENCOUNTER — Other Ambulatory Visit (INDEPENDENT_AMBULATORY_CARE_PROVIDER_SITE_OTHER): Payer: Medicare Other

## 2019-11-23 DIAGNOSIS — I10 Essential (primary) hypertension: Secondary | ICD-10-CM

## 2019-11-23 DIAGNOSIS — E7800 Pure hypercholesterolemia, unspecified: Secondary | ICD-10-CM

## 2019-11-23 LAB — COMPREHENSIVE METABOLIC PANEL
ALT: 15 U/L (ref 0–35)
AST: 30 U/L (ref 0–37)
Albumin: 3.9 g/dL (ref 3.5–5.2)
Alkaline Phosphatase: 64 U/L (ref 39–117)
BUN: 26 mg/dL — ABNORMAL HIGH (ref 6–23)
CO2: 34 mEq/L — ABNORMAL HIGH (ref 19–32)
Calcium: 9 mg/dL (ref 8.4–10.5)
Chloride: 104 mEq/L (ref 96–112)
Creatinine, Ser: 0.85 mg/dL (ref 0.40–1.20)
GFR: 63.11 mL/min (ref >60.00)
Glucose, Bld: 92 mg/dL (ref 70–99)
Potassium: 3.5 mEq/L (ref 3.5–5.1)
Sodium: 143 mEq/L (ref 135–145)
Total Bilirubin: 0.8 mg/dL (ref 0.2–1.2)
Total Protein: 6.7 g/dL (ref 6.0–8.3)

## 2019-11-23 LAB — CBC WITH DIFFERENTIAL/PLATELET
Basophils Absolute: 0.1 10*3/uL (ref 0.0–0.1)
Basophils Relative: 1.1 % (ref 0.0–3.0)
Eosinophils Absolute: 0.1 10*3/uL (ref 0.0–0.7)
Eosinophils Relative: 1.7 % (ref 0.0–5.0)
HCT: 40.7 % (ref 36.0–46.0)
Hemoglobin: 13.6 g/dL (ref 12.0–15.0)
Lymphocytes Relative: 28.7 % (ref 12.0–46.0)
Lymphs Abs: 1.6 10*3/uL (ref 0.7–4.0)
MCHC: 33.5 g/dL (ref 30.0–36.0)
MCV: 95 fl (ref 78.0–100.0)
Monocytes Absolute: 0.6 10*3/uL (ref 0.1–1.0)
Monocytes Relative: 10.7 % (ref 3.0–12.0)
Neutro Abs: 3.3 10*3/uL (ref 1.4–7.7)
Neutrophils Relative %: 57.8 % (ref 43.0–77.0)
Platelets: 236 10*3/uL (ref 150.0–400.0)
RBC: 4.29 Mil/uL (ref 3.87–5.11)
RDW: 15.2 % (ref 11.5–15.5)
WBC: 5.7 10*3/uL (ref 4.0–10.5)

## 2019-11-23 LAB — LIPID PANEL
Cholesterol: 199 mg/dL (ref 0–200)
HDL: 70.6 mg/dL (ref >39.00)
LDL Cholesterol: 114 mg/dL — ABNORMAL HIGH (ref 0–99)
NonHDL: 127.95
Total CHOL/HDL Ratio: 3
Triglycerides: 72 mg/dL (ref 0.0–149.0)
VLDL: 14.4 mg/dL (ref 0.0–40.0)

## 2019-11-23 LAB — TSH: TSH: 5.01 u[IU]/mL — ABNORMAL HIGH (ref 0.35–4.50)

## 2019-11-24 ENCOUNTER — Other Ambulatory Visit: Payer: Self-pay | Admitting: Internal Medicine

## 2019-11-24 DIAGNOSIS — E039 Hypothyroidism, unspecified: Secondary | ICD-10-CM

## 2019-11-24 NOTE — Progress Notes (Signed)
Order placed for f/u lab.   

## 2020-01-07 ENCOUNTER — Telehealth: Payer: Self-pay | Admitting: Internal Medicine

## 2020-01-07 ENCOUNTER — Encounter: Payer: Self-pay | Admitting: Internal Medicine

## 2020-01-07 ENCOUNTER — Ambulatory Visit (INDEPENDENT_AMBULATORY_CARE_PROVIDER_SITE_OTHER): Payer: Medicare Other | Admitting: Internal Medicine

## 2020-01-07 DIAGNOSIS — B029 Zoster without complications: Secondary | ICD-10-CM

## 2020-01-07 MED ORDER — VALACYCLOVIR HCL 1 G PO TABS: 1000.0000 mg | ORAL_TABLET | Freq: Three times a day (TID) | ORAL | 0 refills | Status: DC

## 2020-01-07 MED ORDER — GABAPENTIN 100 MG PO CAPS: 100.0000 mg | ORAL_CAPSULE | Freq: Every evening | ORAL | 0 refills | Status: DC | PRN

## 2020-01-07 NOTE — Telephone Encounter (Signed)
Can I do a virtual visit with her?

## 2020-01-07 NOTE — Telephone Encounter (Signed)
Pt scheduled with Dr Nicki Reaper. Was evaluated by eye MD and given drops to put in her eye.

## 2020-01-07 NOTE — Telephone Encounter (Signed)
I have attempted to reach patient twice. Phone is busy.

## 2020-01-07 NOTE — Telephone Encounter (Signed)
[facility] called back and states that pt has to be seen today. Please call pt back.

## 2020-01-07 NOTE — Telephone Encounter (Signed)
[facility] called and states that the pt has shingles and needs to be seen today. There are no spots avail on anyone's schedule. Please call pt back

## 2020-01-07 NOTE — Progress Notes (Signed)
[name], female   DOB: March 27, 1930, 86 y.o.   MRN: [id number] ...................Marland Kitchen   Virtual Visit via telephone Note  This visit type was conducted due to national recommendations for restrictions regarding the COVID-19 pandemic (e.g. social distancing).  This format is felt to be most appropriate for this patient at this time.  All issues noted in this document were discussed and addressed.  No physical exam was performed (except for noted visual exam findings with Video Visits).   I connected with Alexa Pham by telephone and verified that I am speaking with the correct person using two identifiers. Location patient: home Location provider: work Persons participating in the telephone visit: patient, provider  I discussed the limitations, risks, security and privacy concerns of performing an evaluation and management service by telephone and the availability of in person appointments. The patient expressed understanding and agreed to proceed.   Reason for visit: work in appt  HPI: She reports that approximately 3-4 days ago, she developed pain - head.  States was centered around her eye.  Two days ago, noticed a rash - forehead.  No fever.  No sinus congestion.  No vision change.  No chest congestion or sob.  No chest pain.  Eating.  No nausea or vomiting reported.  Saw eye MD.  States "no eye involvement".  Prescribed erythromycin eye drops.  Has f/u planned in two weeks.  Only involves right side.  Taking scheduled tylenol.  Feels needs something more for pain.     ROS: See pertinent positives and negatives per HPI.  Past Medical History:  Diagnosis Date  . History of breast cancer   . History of pneumonia   . Hypertension   . Hypothyroidism   . Macular degeneration     Past Surgical History:  Procedure Laterality Date  . ABDOMINAL HYSTERECTOMY    . APPENDECTOMY    . BREAST LUMPECTOMY    . CATARACT EXTRACTION, BILATERAL    . CHOLECYSTECTOMY    . REPLACEMENT  TOTAL KNEE    . TONSILLECTOMY AND ADENOIDECTOMY      Family History  Problem Relation Age of Onset  . CAD Mother     SOCIAL HX: reviewed.    Current Outpatient Medications:  .  amLODipine (NORVASC) 10 MG tablet, TAKE 1 TABLET(10 MG) BY MOUTH DAILY, Disp: 90 tablet, Rfl: 1 .  calcium citrate-vitamin D (CITRACAL+D) 315-200 MG-UNIT tablet, Take 1 tablet by mouth 2 (two) times daily., Disp: , Rfl:  .  gabapentin (NEURONTIN) 100 MG capsule, Take 1 capsule (100 mg total) by mouth at bedtime as needed., Disp: 30 capsule, Rfl: 0 .  irbesartan (AVAPRO) 75 MG tablet, Take 1 tablet (75 mg total) by mouth daily., Disp: 90 tablet, Rfl: 1 .  levothyroxine (SYNTHROID) 112 MCG tablet, TAKE 1 TABLET(112 MCG) BY MOUTH DAILY BEFORE BREAKFAST, Disp: 90 tablet, Rfl: 0 .  metoprolol succinate (TOPROL-XL) 50 MG 24 hr tablet, TAKE 1 TABLET BY MOUTH DAILY WITH OR IMMEDIATELY FOLLOWING A MEAL, Disp: 90 tablet, Rfl: 1 .  Multiple Vitamin (MULTIVITAMIN) capsule, Take 1 capsule by mouth daily., Disp: , Rfl:  .  Multiple Vitamins-Minerals (PRESERVISION AREDS 2 PO), Take by mouth., Disp: , Rfl:  .  sertraline (ZOLOFT) 50 MG tablet, Take 1 tablet (50 mg total) by mouth daily., Disp: 30 tablet, Rfl: 2 .  valACYclovir (VALTREX) 1000 MG tablet, Take 1 tablet (1,000 mg total) by mouth 3 (three) times daily., Disp: 21 tablet, Rfl: 0  EXAM:  GENERAL: alert.  Sounds to be in no acute distress.  Answering questions appropriately.    PSYCH/NEURO: pleasant and cooperative, no obvious depression or anxiety, speech and thought processing grossly intact  ASSESSMENT AND PLAN:  Discussed the following assessment and plan:  Shingles Shingles as outlined.  Diagnosed by ophthalmology.  Obtain records.  No eye involvement - per pt.  Has f/u planned in two weeks.  Discussed treatment, etc.  Valtrex as directed.  Gabapentin as directed.  Follow.  Call with update.    Meds ordered this encounter  Medications  . gabapentin  (NEURONTIN) 100 MG capsule    Sig: Take 1 capsule (100 mg total) by mouth at bedtime as needed.    Dispense:  30 capsule    Refill:  0  . valACYclovir (VALTREX) 1000 MG tablet    Sig: Take 1 tablet (1,000 mg total) by mouth 3 (three) times daily.    Dispense:  21 tablet    Refill:  0     I discussed the assessment and treatment plan with the patient. The patient was provided an opportunity to ask questions and all were answered. The patient agreed with the plan and demonstrated an understanding of the instructions.   The patient was advised to call back or seek an in-person evaluation if the symptoms worsen or if the condition fails to improve as anticipated.  I provided 23 minutes of non-face-to-face time during this encounter.   Einar Pheasant, MD

## 2020-01-07 NOTE — Telephone Encounter (Signed)
Phone still busy.

## 2020-01-07 NOTE — Telephone Encounter (Signed)
Patient was seen by her eye doctor and dx with shingles in her eye. Stated she needs to start treatment for the shingles asap and cannot prescribe this medication. Advised that pt should go to urgent care to be seen since our schedule is full and we are closed tomorrow. Pt requested medication be sent in and she can f/u with PCP at appt on 4/5. NO other acute symptoms. Advised I would call her back once discussed with PCP

## 2020-01-10 ENCOUNTER — Encounter: Payer: Self-pay | Admitting: Internal Medicine

## 2020-01-10 DIAGNOSIS — B029 Zoster without complications: Secondary | ICD-10-CM | POA: Insufficient documentation

## 2020-01-10 NOTE — Assessment & Plan Note (Signed)
Shingles as outlined.  Diagnosed by ophthalmology.  Obtain records.  No eye involvement - per pt.  Has f/u planned in two weeks.  Discussed treatment, etc.  Valtrex as directed.  Gabapentin as directed.  Follow.  Call with update.

## 2020-01-11 ENCOUNTER — Other Ambulatory Visit: Payer: Self-pay

## 2020-01-11 ENCOUNTER — Other Ambulatory Visit (INDEPENDENT_AMBULATORY_CARE_PROVIDER_SITE_OTHER): Payer: Medicare Other

## 2020-01-11 ENCOUNTER — Telehealth: Payer: Self-pay | Admitting: Internal Medicine

## 2020-01-11 DIAGNOSIS — E039 Hypothyroidism, unspecified: Secondary | ICD-10-CM | POA: Diagnosis not present

## 2020-01-11 LAB — TSH: TSH: 11.24 u[IU]/mL — ABNORMAL HIGH (ref 0.35–4.50)

## 2020-01-11 MED ORDER — IRBESARTAN 75 MG PO TABS: 75.0000 mg | ORAL_TABLET | Freq: Every day | ORAL | 1 refills | Status: DC

## 2020-01-11 NOTE — Telephone Encounter (Signed)
Called and spoke to Dr Ellin Mayhew to advise that Dr Nicki Reaper is out of the office this afternoon. He stated that this wasn't anything urgent and she does not have to call back unless she would like to. He wanted to be sure that patient was able to be evaluated for shingles on her face. Alexa Pham is a new pt to him and did not want her to go without treatment given her age, history, etc. Advised that Dr Nicki Reaper did a visit with pt on 4/1 and prescribed valtrex and gabapentin. Dr Ellin Mayhew advised that he had sent valtrex in as well just in case we could not see her. Confirmed dosing was the same. Advised that pt was in today for labs and stated she was feeling better. Pt had told him on Thursday that we were not able to see her. Apologized for the confusion and thanked him for calling and advised that I would send message over to Dr Nicki Reaper.

## 2020-01-11 NOTE — Telephone Encounter (Signed)
Called Dr Ellin Mayhew and discussed pt visit and treatment.  Please call pt again and confirm doing ok.  Thanks.

## 2020-01-11 NOTE — Telephone Encounter (Signed)
Dr. Ellin Mayhew office called and wanted to speak to Dr. Nicki Reaper about pt  Please call Dr. Ellin Mayhew  Back at [phone]

## 2020-01-12 ENCOUNTER — Other Ambulatory Visit: Payer: Self-pay

## 2020-01-12 MED ORDER — LEVOTHYROXINE SODIUM 125 MCG PO TABS: 125.0000 ug | ORAL_TABLET | Freq: Every day | ORAL | 1 refills | Status: DC

## 2020-01-12 NOTE — Telephone Encounter (Signed)
See result note for documentation

## 2020-01-19 ENCOUNTER — Encounter: Payer: Self-pay | Admitting: Internal Medicine

## 2020-01-19 ENCOUNTER — Other Ambulatory Visit: Payer: Self-pay

## 2020-01-19 ENCOUNTER — Ambulatory Visit (INDEPENDENT_AMBULATORY_CARE_PROVIDER_SITE_OTHER): Payer: Medicare Other | Admitting: Internal Medicine

## 2020-01-19 DIAGNOSIS — B029 Zoster without complications: Secondary | ICD-10-CM

## 2020-01-19 DIAGNOSIS — J479 Bronchiectasis, uncomplicated: Secondary | ICD-10-CM | POA: Diagnosis not present

## 2020-01-19 DIAGNOSIS — I700 Atherosclerosis of aorta: Secondary | ICD-10-CM

## 2020-01-19 DIAGNOSIS — R634 Abnormal weight loss: Secondary | ICD-10-CM

## 2020-01-19 DIAGNOSIS — Z853 Personal history of malignant neoplasm of breast: Secondary | ICD-10-CM | POA: Diagnosis not present

## 2020-01-19 DIAGNOSIS — F439 Reaction to severe stress, unspecified: Secondary | ICD-10-CM

## 2020-01-19 DIAGNOSIS — E039 Hypothyroidism, unspecified: Secondary | ICD-10-CM

## 2020-01-19 DIAGNOSIS — E7800 Pure hypercholesterolemia, unspecified: Secondary | ICD-10-CM | POA: Diagnosis not present

## 2020-01-19 DIAGNOSIS — I10 Essential (primary) hypertension: Secondary | ICD-10-CM

## 2020-01-19 NOTE — Progress Notes (Signed)
[name], female   DOB: 1929/12/18, 86 y.o.   MRN: [id number]   Subjective:    [name], female    DOB: 07-Dec-1929, 86 y.o.   MRN: [id number]  HPI This visit occurred during the SARS-CoV-2 public health emergency.  Safety protocols were in place, including screening questions prior to the visit, additional usage of staff PPE, and extensive cleaning of exam room while observing appropriate contact time as indicated for disinfecting solutions.  Patient here for a scheduled follow up.  Recently diagnosed with shingles.  Diagnosed by her eye doctor - Dr Ellin Mayhew.  Has f/u this week with him.  No eye involvement.  Pain around the eye and right side head and temple region.  No change in vision.  She took the valtrex.  Off gabapentin.  States she did not know she was supposed to continue taking it.  Taking three ibuprofen bid.  No chest pain.  No sob reported.  No abdominal pain.  Bowels moving.    Past Medical History:  Diagnosis Date   History of breast cancer    History of pneumonia    Hypertension    Hypothyroidism    Macular degeneration    Past Surgical History:  Procedure Laterality Date   ABDOMINAL HYSTERECTOMY     APPENDECTOMY     BREAST LUMPECTOMY     CATARACT EXTRACTION, BILATERAL     CHOLECYSTECTOMY     REPLACEMENT TOTAL KNEE     TONSILLECTOMY AND ADENOIDECTOMY     Family History  Problem Relation Age of Onset   CAD Mother    Social History   Socioeconomic History   Marital status: Married    Spouse name: Not on file   Number of children: Not on file   Years of education: Not on file   Highest education level: Not on file  Occupational History   Not on file  Tobacco Use   Smoking status: Former Smoker    Packs/day: 1.50    Years: 6.00    Pack years: 9.00    Types: Cigarettes    Quit date: 1975    Years since quitting: 46.3   Smokeless tobacco: Never Used  Substance and Sexual Activity   Alcohol use: No   Alcohol/week: 0.0 standard drinks   Drug use: No   Sexual activity: Not on file  Other Topics Concern   Not on file  Social History Narrative   Married    Social Determinants of Health   Financial Resource Strain: Low Risk    Difficulty of Paying Living Expenses: Not hard at all  Food Insecurity: No Food Insecurity   Worried About Charity fundraiser in the Last Year: Never true   Arboriculturist in the Last Year: Never true  Transportation Needs: No Transportation Needs   Lack of Transportation (Medical): No   Lack of Transportation (Non-Medical): No  Physical Activity: Unknown   Days of Exercise per Week: 0 days   Minutes of Exercise per Session: Not on file  Stress: No Stress Concern Present   Feeling of Stress : Not at all  Social Connections:    Frequency of Communication with Friends and Family:    Frequency of Social Gatherings with Friends and Family:    Attends Religious Services:    Active Member of Clubs or Organizations:    Attends Archivist Meetings:    Marital Status:     Outpatient Encounter Medications as of 01/19/2020  Medication Sig   amLODipine (NORVASC) 10 MG tablet TAKE 1 TABLET(10 MG) BY MOUTH DAILY   calcium citrate-vitamin D (CITRACAL+D) 315-200 MG-UNIT tablet Take 1 tablet by mouth 2 (two) times daily.   gabapentin (NEURONTIN) 100 MG capsule Take 1 capsule (100 mg total) by mouth at bedtime as needed.   irbesartan (AVAPRO) 75 MG tablet Take 1 tablet (75 mg total) by mouth daily.   levothyroxine (SYNTHROID) 125 MCG tablet Take 1 tablet (125 mcg total) by mouth daily before breakfast. TAKE 1 TABLET(112 MCG) BY MOUTH DAILY BEFORE BREAKFAST   metoprolol succinate (TOPROL-XL) 50 MG 24 hr tablet TAKE 1 TABLET BY MOUTH DAILY WITH OR IMMEDIATELY FOLLOWING A MEAL   Multiple Vitamin (MULTIVITAMIN) capsule Take 1 capsule by mouth daily.   Multiple Vitamins-Minerals (PRESERVISION AREDS 2 PO) Take by mouth.   sertraline  (ZOLOFT) 50 MG tablet Take 1 tablet (50 mg total) by mouth daily.   valACYclovir (VALTREX) 1000 MG tablet Take 1 tablet (1,000 mg total) by mouth 3 (three) times daily.   No facility-administered encounter medications on file as of 01/19/2020.   Review of Systems  Constitutional: Negative for appetite change, fever and unexpected weight change.  HENT: Negative for congestion and sinus pressure.   Respiratory: Negative for cough, chest tightness and shortness of breath.   Cardiovascular: Negative for chest pain, palpitations and leg swelling.  Gastrointestinal: Negative for abdominal pain, diarrhea, nausea and vomiting.  Genitourinary: Negative for difficulty urinating and dysuria.  Musculoskeletal: Negative for joint swelling and myalgias.  Skin: Negative for color change and rash.  Neurological: Negative for dizziness, light-headedness and headaches.       Pain around right eye and right temple region.    Psychiatric/Behavioral: Negative for agitation and dysphoric mood.       Objective:    Physical Exam Constitutional:      General: She is not in acute distress.    Appearance: Normal appearance.  HENT:     Head: Normocephalic and atraumatic.     Right Ear: External ear normal.     Left Ear: External ear normal.  Eyes:     General: No scleral icterus.       Right eye: No discharge.        Left eye: No discharge.     Conjunctiva/sclera: Conjunctivae normal.  Neck:     Thyroid: No thyromegaly.  Cardiovascular:     Rate and Rhythm: Normal rate and regular rhythm.  Pulmonary:     Effort: No respiratory distress.     Breath sounds: Normal breath sounds. No wheezing.  Abdominal:     General: Bowel sounds are normal.     Palpations: Abdomen is soft.     Tenderness: There is no abdominal tenderness.  Musculoskeletal:        General: No swelling or tenderness.     Cervical back: Neck supple. No tenderness.  Lymphadenopathy:     Cervical: No cervical adenopathy.  Skin:     Findings: No erythema.     Comments: Dried lesions - right temple region and right lateral head.    Neurological:     Mental Status: She is alert.  Psychiatric:        Mood and Affect: Mood normal.        Behavior: Behavior normal.     BP 130/76    Pulse 66    Temp (!) 97.4 F (36.3 C)    Resp 16    Ht _0  (1.676 m)  Wt 111 lb 12.8 oz (50.7 kg)    SpO2 97%    BMI 18.04 kg/m  Wt Readings from Last 3 Encounters:  01/19/20 111 lb 12.8 oz (50.7 kg)  10/19/19 108 lb (49 kg)  07/07/19 110 lb (49.9 kg)     Lab Results  Component Value Date   WBC 5.7 11/23/2019   HGB 13.6 11/23/2019   HCT 40.7 11/23/2019   PLT 236.0 11/23/2019   GLUCOSE 92 11/23/2019   CHOL 199 11/23/2019   TRIG 72.0 11/23/2019   HDL 70.60 11/23/2019   LDLCALC 114 (H) 11/23/2019   ALT 15 11/23/2019   AST 30 11/23/2019   NA 143 11/23/2019   K 3.5 11/23/2019   CL 104 11/23/2019   CREATININE 0.85 11/23/2019   BUN 26 (H) 11/23/2019   CO2 34 (H) 11/23/2019   TSH 11.24 (H) 01/11/2020    CT Super D Chest Wo Contrast  Result Date: 03/10/2019 CLINICAL DATA:  History of breast cancer in 2005. Former smoker. Followup abnormal chest CT. Followup pulmonary infiltrates. EXAM: CT CHEST WITHOUT CONTRAST TECHNIQUE: Multidetector CT imaging of the chest was performed using thin slice collimation for electromagnetic bronchoscopy planning purposes, without intravenous contrast. COMPARISON:  Chest CT 10/09/2018 FINDINGS: Cardiovascular: The heart is borderline enlarged but stable. Stable tortuosity, mild ectasia and calcification of the thoracic aorta. Stable coronary artery calcifications. Mediastinum/Nodes: Stable scattered mediastinal and hilar lymph nodes but no mass or overt adenopathy. The esophagus is grossly normal. Lungs/Pleura: Patchy areas of tree-in-bud appearance, airspace nodularity, peribronchial thickening and endobronchial debris. Some of the airspace nodules are smaller, some have resolved and some are new. This  is very typical for MAC. No findings for typical pneumonia, pulmonary edema or pleural effusions. Upper Abdomen: No significant upper abdominal findings. Left renal cyst noted. Stable atherosclerotic calcifications involving the upper abdominal aorta and branch vessels. Musculoskeletal: The bony structures are intact. Stable degenerative changes and osteoporosis. No chest wall mass, supraclavicular or axillary adenopathy. Surgical changes involving the left breast and left axilla. IMPRESSION: 1. Typical CT imaging features of MAC as detailed above. No focal pneumonia or worrisome pulmonary lesions. Changing airspace nodules typical with this disease process. 2. No acute superimposed process or worrisome pulmonary lesions. 3. Stable age related vascular disease. Aortic Atherosclerosis (ICD10-I70.0). Electronically Signed   By: Marijo Sanes M.D.   On: 03/10/2019 12:43       Assessment & Plan:   Problem List Items Addressed This Visit    Aortic atherosclerosis ([place])    Declines statin medication.       Bronchiectasis without complication ([place])    Has been evaluated by pulmonary.  Breathing stable.       History of breast cancer    S/p lumpectomy.  Declines mammogram.        Hypercholesterolemia    Declines cholesterol medication.  Follow lipid panel.       Hypertension    Blood pressure as outlined.  Continue amlodipine and metoprolol.  Follow pressures.  Follow metabolic panel.        Hypothyroidism    On thyroid replacement.  Recent adjustment in dose.  Follow tsh.       Shingles    Shingles as outlined.  Completed valtrex.  Has been evaluated by her eye doctor.  No eye involvement - per her report.  Still with increased pain.  Gabapentin as directed.  Follow.  Increase to bid.        Stress    On zoloft.  Stable.        Weight loss    Weight up a few pounds from last check.  Follow.            Einar Pheasant, MD

## 2020-01-19 NOTE — Patient Instructions (Addendum)
Take gabapentin - one capsule twice a day.

## 2020-01-22 ENCOUNTER — Telehealth: Payer: Self-pay | Admitting: Internal Medicine

## 2020-01-22 NOTE — Telephone Encounter (Signed)
Yes she can increase gabapentin to 300 mg tid

## 2020-01-22 NOTE — Telephone Encounter (Signed)
Patient notified and voiced understanding.

## 2020-01-22 NOTE — Telephone Encounter (Signed)
Pt called and said that the gabapentin is not helping with her shingles and she wanted to know what else can be called in or if she can take 3

## 2020-01-22 NOTE — Telephone Encounter (Signed)
Patient DX with Shingles 01/07/20 taking Gabapentin 100 mg at bedtime patient asking can she increase dose? She has been taking 200 mg asking can she increase to 300 mg .

## 2020-01-24 ENCOUNTER — Encounter: Payer: Self-pay | Admitting: Internal Medicine

## 2020-01-24 NOTE — Assessment & Plan Note (Addendum)
On thyroid replacement.  Recent adjustment in dose.  Follow tsh.

## 2020-01-24 NOTE — Assessment & Plan Note (Signed)
Weight up a few pounds from last check.  Follow  ?

## 2020-01-24 NOTE — Assessment & Plan Note (Signed)
S/p lumpectomy.  Declines mammogram.  

## 2020-01-24 NOTE — Assessment & Plan Note (Signed)
Declines statin medication.   

## 2020-01-24 NOTE — Assessment & Plan Note (Signed)
Declines cholesterol medication.  Follow lipid panel.  

## 2020-01-24 NOTE — Assessment & Plan Note (Signed)
Shingles as outlined.  Completed valtrex.  Has been evaluated by her eye doctor.  No eye involvement - per her report.  Still with increased pain.  Gabapentin as directed.  Follow.  Increase to bid.

## 2020-01-24 NOTE — Assessment & Plan Note (Signed)
Blood pressure as outlined.  Continue amlodipine and metoprolol.  Follow pressures.  Follow metabolic panel.  

## 2020-01-24 NOTE — Assessment & Plan Note (Addendum)
On zoloft.  Stable.  

## 2020-01-24 NOTE — Assessment & Plan Note (Signed)
Has been evaluated by pulmonary.  Breathing stable.  

## 2020-01-28 ENCOUNTER — Ambulatory Visit (INDEPENDENT_AMBULATORY_CARE_PROVIDER_SITE_OTHER): Payer: Medicare Other | Admitting: Dermatology

## 2020-01-28 ENCOUNTER — Other Ambulatory Visit: Payer: Self-pay

## 2020-01-28 DIAGNOSIS — L578 Other skin changes due to chronic exposure to nonionizing radiation: Secondary | ICD-10-CM | POA: Diagnosis not present

## 2020-01-28 DIAGNOSIS — D485 Neoplasm of uncertain behavior of skin: Secondary | ICD-10-CM | POA: Diagnosis not present

## 2020-01-28 DIAGNOSIS — Z85828 Personal history of other malignant neoplasm of skin: Secondary | ICD-10-CM | POA: Diagnosis not present

## 2020-01-28 DIAGNOSIS — L570 Actinic keratosis: Secondary | ICD-10-CM

## 2020-01-28 HISTORY — DX: Actinic keratosis: L57.0

## 2020-01-28 NOTE — Progress Notes (Signed)
Follow-Up Visit   Subjective  Alexa Pham is a 86 y.o. female who presents for the following: lesion (on the nose - has been there for about a year and treated twice with Ln2 per patient) and lesion (on the right thumb - has been there for about 10 years and treated multiple times, but will not resolve).  The following portions of the chart were reviewed this encounter and updated as appropriate: Tobacco  Allergies  Meds  Problems  Med Hx  Surg Hx  Fam Hx     Review of Systems: No other skin or systemic complaints.  Objective  Well appearing patient in no apparent distress; mood and affect are within normal limits.  A focused examination was performed including right hand and face. Relevant physical exam findings are noted in the Assessment and Plan.  Objective  L of midline mid dorsum nose: Crusted patch 1.2 x 0.7 cm      Objective  R thumb proximal nail fold: 1.0 x 0.5 cm pink hyperkeratotic patch      Assessment & Plan  Neoplasm of uncertain behavior of skin (2) L of midline mid dorsum nose  Skin / nail biopsy Type of biopsy: tangential   Informed consent: discussed and consent obtained   Timeout: patient name, date of birth, surgical site, and procedure verified   Procedure prep:  Patient was prepped and draped in usual sterile fashion Prep type:  Isopropyl alcohol Anesthesia: the lesion was anesthetized in a standard fashion   Anesthetic:  1% lidocaine w/ epinephrine 1-100,000 buffered w/ 8.4% NaHCO3 Instrument used: flexible razor blade   Hemostasis achieved with: pressure, aluminum chloride and electrodesiccation   Outcome: patient tolerated procedure well   Post-procedure details: sterile dressing applied and wound care instructions given   Dressing type: bandage and petrolatum    Specimen 1 - Surgical pathology Differential Diagnosis: D48.5 r/o SCC vs other  Check Margins: No Crusted patch 1.2 x 0.7 cm  R thumb proximal nail fold  Skin / nail  biopsy Type of biopsy: tangential   Informed consent: discussed and consent obtained   Timeout: patient name, date of birth, surgical site, and procedure verified   Procedure prep:  Patient was prepped and draped in usual sterile fashion Prep type:  Isopropyl alcohol Anesthesia: the lesion was anesthetized in a standard fashion   Anesthetic:  1% lidocaine w/ epinephrine 1-100,000 buffered w/ 8.4% NaHCO3 Instrument used: flexible razor blade   Hemostasis achieved with: pressure, aluminum chloride and electrodesiccation   Outcome: patient tolerated procedure well   Post-procedure details: sterile dressing applied and wound care instructions given   Dressing type: bandage and petrolatum    Specimen 2 - Surgical pathology Differential Diagnosis: R/O SCC vs other  Check Margins: No 1.0 x 0.5 cm pink hyperkeratotic patch  Actinic Damage - diffuse scaly erythematous macules with underlying dyspigmentation - Recommend daily broad spectrum sunscreen SPF 30+ to sun-exposed areas, reapply every 2 hours as needed.  - Call for new or changing lesions.  History of Squamous Cell Carcinoma of the Skin - No evidence of recurrence today - No lymphadenopathy - Recommend regular full body skin exams - Recommend daily broad spectrum sunscreen SPF 30+ to sun-exposed areas, reapply every 2 hours as needed.  - Call if any new or changing lesions are noted between office visits   Return in about 6 months (around 07/29/2020).   Luther Redo, CMA, am acting as scribe for Sarina Ser, MD .  Documentation: I have  reviewed the above documentation for accuracy and completeness, and I agree with the above.  Sarina Ser, MD

## 2020-01-28 NOTE — Patient Instructions (Signed)

## 2020-01-29 ENCOUNTER — Encounter: Payer: Self-pay | Admitting: Dermatology

## 2020-02-01 ENCOUNTER — Telehealth: Payer: Self-pay

## 2020-02-01 NOTE — Telephone Encounter (Signed)
Patient has no Gabapentin left DX shingles 3.5 weeks ago patient was able to get 3 emergency capsules from pharmacy on 01/31/20 and patient has taken one 100 mg and feels much better today she says, patient is hoping she will not need further treatment.

## 2020-02-01 NOTE — Telephone Encounter (Signed)
[facility] TELEPHONE ADVICE RECORD [facility] Patient Name: Alexa Pham Gender: Female DOB: August 19, 1930 Age: 86 Y 3 M 50 D Return Phone Number: [phone] (Primary) Address: City/State/Zip: [place] [address] Client [facility] Client Site [facility]ician Einar Pheasant - MD Contact Type Call Who Is Calling Patient / Member / Family / Caregiver Call Type Triage / Clinical Caller Name Gailen Shelter Relationship To Patient Neighbor Return Phone Number [phone] (Primary) Chief Complaint Eye Pain Reason for Call Symptomatic / Request for [place] states her neighbor needs a refill on her gabapentin. She is experiencing pain in her right temple and eye. Translation No Nurse Assessment Nurse: Morey Hummingbird, RN, Caitlin Date/Time (Eastern Time): 01/30/2020 4:50:01 PM Confirm and document reason for call. If symptomatic, describe symptoms. ---Caller states pt was dx with shingles about 3.5 weeks ago on her face and had 30 gabapentin, 100mg , but she has ran out. Caller states pt's pain has remained constant and feels unable to do much. Has the patient had close contact with a person known or suspected to have the novel coronavirus illness OR traveled / lives in area with major community spread (including international travel) in the last 14 days from the onset of symptoms? * If Asymptomatic, screen for exposure and travel within the last 14 days. ---No Does the patient have any new or worsening symptoms? ---Yes Will a triage be completed? ---Yes Related visit to physician within the last 2 weeks? ---Yes Does the PT have any chronic conditions? (i.e. diabetes, asthma, this includes High risk factors for pregnancy, etc.) ---No Is this a behavioral health or substance abuse call? ---No Guidelines Guideline Title Affirmed  Question Affirmed Notes Nurse Date/Time Eilene Ghazi Time) Shingles SEVERE pain (e.g., excruciating) Morey Hummingbird, RN, Caitlin 01/30/2020 4:53:10 PM Disp. Time (Eastern Time) Disposition Final UserPLEASE NOTE: All timestamps contained within this report are represented as [place] Standard Time. CONFIDENTIALTY NOTICE: This fax transmission is intended only for the addressee. It contains information that is legally privileged, confidential or otherwise protected from use or disclosure. If you are not the intended recipient, you are strictly prohibited from reviewing, disclosing, copying using or disseminating any of this information or taking any action in reliance on or regarding this information. If you have received this fax in error, please notify us immediately by telephone so that we can arrange for its return to [place]. Phone: [phone], Toll-Free: [phone], Fax: [phone] Page: 2 of 2 Call Id: [id number] 01/30/2020 4:57:00 PM See PCP within 24 Hours Yes Morey Hummingbird, RN, Docia Barrier Disagree/Comply Disagree Caller Understands Yes PreDisposition InappropriateToAsk Care Advice Given Per Guideline * IF OFFICE WILL BE OPEN: You need to be seen within the next 24 hours. Call your doctor (or NP/PA) when the office opens and make an appointment. PAIN MEDICINES: * ACETAMINOPHEN - REGULAR STRENGTH TYLENOL: Take 650 mg (two 325 mg pills) by mouth every 4 to 6 hours as needed. Each Regular Strength Tylenol pill has 325 mg of acetaminophen. The most you should take each day is 3,250 mg (10 pills a day). * IBUPROFEN (E.G., MOTRIN, ADVIL): Take 400 mg (two 200 mg pills) by mouth every 6 hours. The most you should take each day is 1,200 mg (six 200 mg pills), unless your doctor has told you to take more. CALL BACK IF: * Fever over 100.4 F (38.0 C) * You become worse. CARE ADVICE given per Shingles (Adult) guideline.  Comments User: Rudell Cobb, RN Date/Time Eilene Ghazi Time): 01/30/2020 4:57:28 PM Caller states pt  does not want to go to UC at this time. Referrals  [facility]gent [facility] at [facility] [place]

## 2020-02-01 NOTE — Telephone Encounter (Signed)
Please call pt and clarify - last visit, I informed her she could take the gabapentin 100mg  bid.  Has she been taking this.  Has it been helping.  If still with increased pain, we can increase to tid - if tolerating the medication.  I will send in rx - just need to clarify how she is doing and what dose taking.

## 2020-02-01 NOTE — Telephone Encounter (Signed)
Called patient to clarify. She was taking gabapentin tid. She got 3 capsules from the pharmacy and only took one. She has two left. She is requesting to have this taken off of her med list because the pain is gone. She does not wish to continue this medication and will let us know if pain returns.

## 2020-02-02 ENCOUNTER — Other Ambulatory Visit: Payer: Self-pay

## 2020-02-04 ENCOUNTER — Telehealth: Payer: Self-pay

## 2020-02-08 NOTE — Telephone Encounter (Signed)
Left message for patient to call office for results. °

## 2020-02-09 ENCOUNTER — Telehealth: Payer: Self-pay

## 2020-02-09 DIAGNOSIS — C44311 Basal cell carcinoma of skin of nose: Secondary | ICD-10-CM

## 2020-02-09 NOTE — Telephone Encounter (Signed)
Patient informed of pathology results. Referral faxed to Dr. Lacinda Axon.

## 2020-02-19 ENCOUNTER — Telehealth: Payer: Self-pay | Admitting: *Deleted

## 2020-02-19 DIAGNOSIS — E039 Hypothyroidism, unspecified: Secondary | ICD-10-CM

## 2020-02-19 NOTE — Telephone Encounter (Signed)
Please place future orders for lab appt.  

## 2020-02-19 NOTE — Telephone Encounter (Signed)
Order placed for f/u lab.   

## 2020-02-23 ENCOUNTER — Other Ambulatory Visit: Payer: Self-pay

## 2020-02-23 ENCOUNTER — Other Ambulatory Visit (INDEPENDENT_AMBULATORY_CARE_PROVIDER_SITE_OTHER): Payer: Medicare Other

## 2020-02-23 DIAGNOSIS — E039 Hypothyroidism, unspecified: Secondary | ICD-10-CM | POA: Diagnosis not present

## 2020-02-24 ENCOUNTER — Ambulatory Visit: Payer: Medicare Other | Admitting: Internal Medicine

## 2020-02-24 DIAGNOSIS — Z0289 Encounter for other administrative examinations: Secondary | ICD-10-CM

## 2020-02-24 LAB — TSH: TSH: 0.81 u[IU]/mL (ref 0.35–4.50)

## 2020-03-10 ENCOUNTER — Ambulatory Visit (INDEPENDENT_AMBULATORY_CARE_PROVIDER_SITE_OTHER): Payer: Medicare Other | Admitting: Internal Medicine

## 2020-03-10 ENCOUNTER — Other Ambulatory Visit: Payer: Self-pay

## 2020-03-10 ENCOUNTER — Encounter: Payer: Self-pay | Admitting: Internal Medicine

## 2020-03-10 DIAGNOSIS — R634 Abnormal weight loss: Secondary | ICD-10-CM

## 2020-03-10 DIAGNOSIS — E039 Hypothyroidism, unspecified: Secondary | ICD-10-CM

## 2020-03-10 DIAGNOSIS — F439 Reaction to severe stress, unspecified: Secondary | ICD-10-CM

## 2020-03-10 DIAGNOSIS — I10 Essential (primary) hypertension: Secondary | ICD-10-CM | POA: Diagnosis not present

## 2020-03-10 DIAGNOSIS — B029 Zoster without complications: Secondary | ICD-10-CM | POA: Diagnosis not present

## 2020-03-10 DIAGNOSIS — Z853 Personal history of malignant neoplasm of breast: Secondary | ICD-10-CM

## 2020-03-10 DIAGNOSIS — I700 Atherosclerosis of aorta: Secondary | ICD-10-CM

## 2020-03-10 DIAGNOSIS — J479 Bronchiectasis, uncomplicated: Secondary | ICD-10-CM

## 2020-03-10 DIAGNOSIS — E7800 Pure hypercholesterolemia, unspecified: Secondary | ICD-10-CM

## 2020-03-10 NOTE — Progress Notes (Signed)
[name], female   DOB: September 12, 1930, 86 y.o.   MRN: [id number]   Subjective:    [name], female    DOB: 1929-11-24, 86 y.o.   MRN: [id number]  HPI This visit occurred during the SARS-CoV-2 public health emergency.  Safety protocols were in place, including screening questions prior to the visit, additional usage of staff PPE, and extensive cleaning of exam room while observing appropriate contact time as indicated for disinfecting solutions.  Patient here for a scheduled follow up.  Recently diagnosed with shingles.  Treated with valtrex and placed on gabapentin.  Off gabapentin now. Pain is better.  She feels better.  She is eating.  Weight is up a few pounds.  No chest pain or sob reported.  No abdominal pain or bowel change reported.  Husband is doing better.  Handling stress.    Past Medical History:  Diagnosis Date  . History of breast cancer   . History of pneumonia   . Hypertension   . Hypothyroidism   . Macular degeneration    Past Surgical History:  Procedure Laterality Date  . ABDOMINAL HYSTERECTOMY    . APPENDECTOMY    . BREAST LUMPECTOMY    . CATARACT EXTRACTION, BILATERAL    . CHOLECYSTECTOMY    . REPLACEMENT TOTAL KNEE    . TONSILLECTOMY AND ADENOIDECTOMY     Family History  Problem Relation Age of Onset  . CAD Mother    Social History   Socioeconomic History  . Marital status: Married    Spouse name: Not on file  . Number of children: Not on file  . Years of education: Not on file  . Highest education level: Not on file  Occupational History  . Not on file  Tobacco Use  . Smoking status: Former Smoker    Packs/day: 1.50    Years: 6.00    Pack years: 9.00    Types: Cigarettes    Quit date: 1975    Years since quitting: 46.4  . Smokeless tobacco: Never Used  Substance and Sexual Activity  . Alcohol use: No    Alcohol/week: 0.0 standard drinks  . Drug use: No  . Sexual activity: Not on file  Other Topics Concern  . Not  on file  Social History Narrative   Married    Social Determinants of Health   Financial Resource Strain: Low Risk   . Difficulty of Paying Living Expenses: Not hard at all  Food Insecurity: No Food Insecurity  . Worried About Charity fundraiser in the Last Year: Never true  . Ran Out of Food in the Last Year: Never true  Transportation Needs: No Transportation Needs  . Lack of Transportation (Medical): No  . Lack of Transportation (Non-Medical): No  Physical Activity: Unknown  . Days of Exercise per Week: 0 days  . Minutes of Exercise per Session: Not on file  Stress: No Stress Concern Present  . Feeling of Stress : Not at all  Social Connections:   . Frequency of Communication with Friends and Family:   . Frequency of Social Gatherings with Friends and Family:   . Attends Religious Services:   . Active Member of Clubs or Organizations:   . Attends Archivist Meetings:   Marland Kitchen Marital Status:     Outpatient Encounter Medications as of 03/10/2020  Medication Sig  . amLODipine (NORVASC) 10 MG tablet TAKE 1 TABLET(10 MG) BY MOUTH DAILY  . calcium citrate-vitamin D (CITRACAL+D) 315-200  MG-UNIT tablet Take 1 tablet by mouth 2 (two) times daily.  . irbesartan (AVAPRO) 75 MG tablet Take 1 tablet (75 mg total) by mouth daily.  Marland Kitchen levothyroxine (SYNTHROID) 125 MCG tablet Take 1 tablet (125 mcg total) by mouth daily before breakfast. TAKE 1 TABLET(112 MCG) BY MOUTH DAILY BEFORE BREAKFAST  . metoprolol succinate (TOPROL-XL) 50 MG 24 hr tablet TAKE 1 TABLET BY MOUTH DAILY WITH OR IMMEDIATELY FOLLOWING A MEAL  . Multiple Vitamin (MULTIVITAMIN) capsule Take 1 capsule by mouth daily.  . Multiple Vitamins-Minerals (PRESERVISION AREDS 2 PO) Take by mouth.  . [DISCONTINUED] sertraline (ZOLOFT) 50 MG tablet Take 1 tablet (50 mg total) by mouth daily. (Patient not taking: Reported on 01/28/2020)  . [DISCONTINUED] valACYclovir (VALTREX) 1000 MG tablet Take 1 tablet (1,000 mg total) by mouth 3  (three) times daily.   No facility-administered encounter medications on file as of 03/10/2020.    Review of Systems  Constitutional: Negative for appetite change and unexpected weight change.  HENT: Negative for congestion and sinus pressure.   Respiratory: Negative for cough, chest tightness and shortness of breath.   Cardiovascular: Negative for chest pain, palpitations and leg swelling.  Gastrointestinal: Negative for abdominal pain, diarrhea, nausea and vomiting.  Genitourinary: Negative for difficulty urinating and dysuria.  Musculoskeletal: Negative for joint swelling and myalgias.  Skin: Negative for color change and rash.  Neurological: Negative for dizziness, light-headedness and headaches.  Psychiatric/Behavioral: Negative for agitation and dysphoric mood.       Objective:    Physical Exam Vitals reviewed.  Constitutional:      General: She is not in acute distress.    Appearance: Normal appearance.  HENT:     Head: Normocephalic and atraumatic.     Right Ear: External ear normal.     Left Ear: External ear normal.  Eyes:     General:        Right eye: No discharge.        Left eye: No discharge.     Conjunctiva/sclera: Conjunctivae normal.  Neck:     Thyroid: No thyromegaly.  Cardiovascular:     Rate and Rhythm: Normal rate and regular rhythm.  Pulmonary:     Effort: No respiratory distress.     Breath sounds: Normal breath sounds. No wheezing.  Abdominal:     General: Bowel sounds are normal.     Palpations: Abdomen is soft.     Tenderness: There is no abdominal tenderness.  Musculoskeletal:        General: No swelling or tenderness.     Cervical back: Neck supple. No tenderness.  Lymphadenopathy:     Cervical: No cervical adenopathy.  Skin:    Findings: No erythema or rash.  Neurological:     Mental Status: She is alert.  Psychiatric:        Mood and Affect: Mood normal.        Behavior: Behavior normal.     BP 130/70   Pulse 79   Temp (!)  96.7 F (35.9 C)   Resp 16   Ht _0  (1.676 m)   Wt 113 lb 6.4 oz (51.4 kg)   SpO2 98%   BMI 18.30 kg/m  Wt Readings from Last 3 Encounters:  03/10/20 113 lb 6.4 oz (51.4 kg)  01/19/20 111 lb 12.8 oz (50.7 kg)  10/19/19 108 lb (49 kg)     Lab Results  Component Value Date   WBC 5.7 11/23/2019   HGB 13.6 11/23/2019  HCT 40.7 11/23/2019   PLT 236.0 11/23/2019   GLUCOSE 92 11/23/2019   CHOL 199 11/23/2019   TRIG 72.0 11/23/2019   HDL 70.60 11/23/2019   LDLCALC 114 (H) 11/23/2019   ALT 15 11/23/2019   AST 30 11/23/2019   NA 143 11/23/2019   K 3.5 11/23/2019   CL 104 11/23/2019   CREATININE 0.85 11/23/2019   BUN 26 (H) 11/23/2019   CO2 34 (H) 11/23/2019   TSH 0.81 02/23/2020    CT Super D Chest Wo Contrast  Result Date: 03/10/2019 CLINICAL DATA:  History of breast cancer in 2005. Former smoker. Followup abnormal chest CT. Followup pulmonary infiltrates. EXAM: CT CHEST WITHOUT CONTRAST TECHNIQUE: Multidetector CT imaging of the chest was performed using thin slice collimation for electromagnetic bronchoscopy planning purposes, without intravenous contrast. COMPARISON:  Chest CT 10/09/2018 FINDINGS: Cardiovascular: The heart is borderline enlarged but stable. Stable tortuosity, mild ectasia and calcification of the thoracic aorta. Stable coronary artery calcifications. Mediastinum/Nodes: Stable scattered mediastinal and hilar lymph nodes but no mass or overt adenopathy. The esophagus is grossly normal. Lungs/Pleura: Patchy areas of tree-in-bud appearance, airspace nodularity, peribronchial thickening and endobronchial debris. Some of the airspace nodules are smaller, some have resolved and some are new. This is very typical for MAC. No findings for typical pneumonia, pulmonary edema or pleural effusions. Upper Abdomen: No significant upper abdominal findings. Left renal cyst noted. Stable atherosclerotic calcifications involving the upper abdominal aorta and branch vessels.  Musculoskeletal: The bony structures are intact. Stable degenerative changes and osteoporosis. No chest wall mass, supraclavicular or axillary adenopathy. Surgical changes involving the left breast and left axilla. IMPRESSION: 1. Typical CT imaging features of MAC as detailed above. No focal pneumonia or worrisome pulmonary lesions. Changing airspace nodules typical with this disease process. 2. No acute superimposed process or worrisome pulmonary lesions. 3. Stable age related vascular disease. Aortic Atherosclerosis (ICD10-I70.0). Electronically Signed   By: Marijo Sanes M.D.   On: 03/10/2019 12:43       Assessment & Plan:   Problem List Items Addressed This Visit    Aortic atherosclerosis ([place])    Declines statin medication.        Bronchiectasis without complication ([place])    Has been evaluated by pulmonary.  Breathing stable.       History of breast cancer    S/p lumpectomy.  Declines mammogram.       Hypercholesterolemia    Declines cholesterol medication.  Follow lipid panel.       Relevant Orders   Lipid panel   Hypertension    Blood pressure doing well.  On amlodipine, metoprolol and avapro.  Follow pressures.  Follow metabolic panel.       Relevant Orders   Comprehensive metabolic panel   Hypothyroidism    On thyroid replacement.  Follow tsh.       Relevant Orders   TSH   Shingles    Pain better.  Doing better.  Not requiring gabapentin.        Stress    Husband is doing better. Handling stress.  Follow.        Weight loss    Weight up a few pounds from previous check. States she is eating well.  Follow.            Einar Pheasant, MD

## 2020-03-14 ENCOUNTER — Encounter: Payer: Self-pay | Admitting: Internal Medicine

## 2020-03-14 NOTE — Assessment & Plan Note (Signed)
Weight up a few pounds from previous check. States she is eating well.  Follow.

## 2020-03-14 NOTE — Assessment & Plan Note (Signed)
On thyroid replacement.  Follow tsh.  

## 2020-03-14 NOTE — Assessment & Plan Note (Signed)
Pain better.  Doing better.  Not requiring gabapentin.

## 2020-03-14 NOTE — Assessment & Plan Note (Signed)
S/p lumpectomy.  Declines mammogram.  

## 2020-03-14 NOTE — Assessment & Plan Note (Signed)
Declines cholesterol medication.  Follow lipid panel.  

## 2020-03-14 NOTE — Assessment & Plan Note (Signed)
Husband is doing better. Handling stress.  Follow.

## 2020-03-14 NOTE — Assessment & Plan Note (Signed)
Blood pressure doing well.  On amlodipine, metoprolol and avapro.  Follow pressures.  Follow metabolic panel.

## 2020-03-14 NOTE — Assessment & Plan Note (Signed)
Has been evaluated by pulmonary.  Breathing stable.  

## 2020-03-14 NOTE — Assessment & Plan Note (Signed)
Declines statin medication.

## 2020-03-29 ENCOUNTER — Other Ambulatory Visit: Payer: Self-pay | Admitting: Internal Medicine

## 2020-04-12 ENCOUNTER — Other Ambulatory Visit: Payer: Self-pay | Admitting: Internal Medicine

## 2020-05-02 ENCOUNTER — Other Ambulatory Visit: Payer: Self-pay | Admitting: Internal Medicine

## 2020-05-04 ENCOUNTER — Observation Stay
Admission: EM | Admit: 2020-05-04 | Discharge: 2020-05-05 | Disposition: A | Discharge status: Home / Self Care | Payer: Medicare Other | Attending: Internal Medicine | Admitting: Internal Medicine

## 2020-05-04 ENCOUNTER — Other Ambulatory Visit: Payer: Self-pay

## 2020-05-04 ENCOUNTER — Emergency Department: Payer: Medicare Other

## 2020-05-04 ENCOUNTER — Encounter: Payer: Self-pay | Admitting: Emergency Medicine

## 2020-05-04 DIAGNOSIS — E039 Hypothyroidism, unspecified: Secondary | ICD-10-CM | POA: Diagnosis present

## 2020-05-04 DIAGNOSIS — R7989 Other specified abnormal findings of blood chemistry: Secondary | ICD-10-CM | POA: Diagnosis present

## 2020-05-04 DIAGNOSIS — Z87891 Personal history of nicotine dependence: Secondary | ICD-10-CM | POA: Diagnosis not present

## 2020-05-04 DIAGNOSIS — Z20822 Contact with and (suspected) exposure to covid-19: Secondary | ICD-10-CM | POA: Insufficient documentation

## 2020-05-04 DIAGNOSIS — E7800 Pure hypercholesterolemia, unspecified: Secondary | ICD-10-CM | POA: Insufficient documentation

## 2020-05-04 DIAGNOSIS — R748 Abnormal levels of other serum enzymes: Secondary | ICD-10-CM | POA: Diagnosis not present

## 2020-05-04 DIAGNOSIS — R42 Dizziness and giddiness: Principal | ICD-10-CM

## 2020-05-04 DIAGNOSIS — Z853 Personal history of malignant neoplasm of breast: Secondary | ICD-10-CM | POA: Insufficient documentation

## 2020-05-04 DIAGNOSIS — Z79899 Other long term (current) drug therapy: Secondary | ICD-10-CM | POA: Insufficient documentation

## 2020-05-04 DIAGNOSIS — I959 Hypotension, unspecified: Secondary | ICD-10-CM | POA: Diagnosis not present

## 2020-05-04 DIAGNOSIS — R778 Other specified abnormalities of plasma proteins: Secondary | ICD-10-CM | POA: Diagnosis present

## 2020-05-04 DIAGNOSIS — I10 Essential (primary) hypertension: Secondary | ICD-10-CM | POA: Diagnosis not present

## 2020-05-04 DIAGNOSIS — E785 Hyperlipidemia, unspecified: Secondary | ICD-10-CM | POA: Diagnosis present

## 2020-05-04 DIAGNOSIS — I951 Orthostatic hypotension: Secondary | ICD-10-CM | POA: Diagnosis present

## 2020-05-04 LAB — BRAIN NATRIURETIC PEPTIDE: B Natriuretic Peptide: 178.4 pg/mL — ABNORMAL HIGH (ref 0.0–100.0)

## 2020-05-04 LAB — CBC
HCT: 39.5 % (ref 36.0–46.0)
Hemoglobin: 12.8 g/dL (ref 12.0–15.0)
MCH: 32.2 pg (ref 26.0–34.0)
MCHC: 32.4 g/dL (ref 30.0–36.0)
MCV: 99.2 fL (ref 80.0–100.0)
Platelets: 218 10*3/uL (ref 150–400)
RBC: 3.98 MIL/uL (ref 3.87–5.11)
RDW: 15.2 % (ref 11.5–15.5)
WBC: 6.4 10*3/uL (ref 4.0–10.5)
nRBC: 0 % (ref 0.0–0.2)

## 2020-05-04 LAB — TROPONIN I (HIGH SENSITIVITY)
Troponin I (High Sensitivity): 52 ng/L — ABNORMAL HIGH (ref <18)
Troponin I (High Sensitivity): 220 ng/L (ref <18)
Troponin I (High Sensitivity): 217 ng/L (ref <18)
Troponin I (High Sensitivity): 228 ng/L (ref <18)

## 2020-05-04 LAB — HEPATIC FUNCTION PANEL
ALT: 18 U/L (ref 0–44)
AST: 31 U/L (ref 15–41)
Albumin: 3.3 g/dL — ABNORMAL LOW (ref 3.5–5.0)
Alkaline Phosphatase: 48 U/L (ref 38–126)
Bilirubin, Direct: 0.2 mg/dL (ref 0.0–0.2)
Indirect Bilirubin: 0.8 mg/dL (ref 0.3–0.9)
Total Bilirubin: 1 mg/dL (ref 0.3–1.2)
Total Protein: 5.7 g/dL — ABNORMAL LOW (ref 6.5–8.1)

## 2020-05-04 LAB — BASIC METABOLIC PANEL
Anion gap: 9 (ref 5–15)
BUN: 28 mg/dL — ABNORMAL HIGH (ref 8–23)
CO2: 29 mmol/L (ref 22–32)
Calcium: 8.4 mg/dL — ABNORMAL LOW (ref 8.9–10.3)
Chloride: 105 mmol/L (ref 98–111)
Creatinine, Ser: 0.91 mg/dL (ref 0.44–1.00)
GFR calc Af Amer: >60 mL/min (ref >60)
GFR calc non Af Amer: 56 mL/min — ABNORMAL LOW (ref >60)
Glucose, Bld: 124 mg/dL — ABNORMAL HIGH (ref 70–99)
Potassium: 3.5 mmol/L (ref 3.5–5.1)
Sodium: 143 mmol/L (ref 135–145)

## 2020-05-04 LAB — SARS CORONAVIRUS 2 BY RT PCR (HOSPITAL ORDER, PERFORMED IN ~~LOC~~ HOSPITAL LAB): SARS Coronavirus 2: NEGATIVE

## 2020-05-04 LAB — MAGNESIUM: Magnesium: 1.6 mg/dL — ABNORMAL LOW (ref 1.7–2.4)

## 2020-05-04 LAB — TSH: TSH: 5.939 u[IU]/mL — ABNORMAL HIGH (ref 0.350–4.500)

## 2020-05-04 MED ORDER — ASPIRIN EC 325 MG PO TBEC: 325.0000 mg | DELAYED_RELEASE_TABLET | Freq: Every day | ORAL | Status: DC

## 2020-05-04 MED ORDER — ADULT MULTIVITAMIN W/MINERALS CH
1.0000 | ORAL_TABLET | Freq: Every day | ORAL | Status: DC
  Administered 2020-05-05: 1 via ORAL
  Filled 2020-05-04: qty 1

## 2020-05-04 MED ORDER — ONDANSETRON HCL 4 MG/2ML IJ SOLN: 4.0000 mg | Freq: Three times a day (TID) | INTRAMUSCULAR | Status: DC | PRN

## 2020-05-04 MED ORDER — ASPIRIN 325 MG PO TABS
325.0000 mg | ORAL_TABLET | Freq: Every day | ORAL | Status: DC
  Filled 2020-05-04: qty 1

## 2020-05-04 MED ORDER — ACETAMINOPHEN 325 MG PO TABS: 650.0000 mg | ORAL_TABLET | Freq: Four times a day (QID) | ORAL | Status: DC | PRN

## 2020-05-04 MED ORDER — AMLODIPINE BESYLATE 5 MG PO TABS
5.0000 mg | ORAL_TABLET | Freq: Every day | ORAL | Status: DC
  Administered 2020-05-05: 5 mg via ORAL
  Filled 2020-05-04: qty 1

## 2020-05-04 MED ORDER — ATORVASTATIN CALCIUM 20 MG PO TABS
40.0000 mg | ORAL_TABLET | Freq: Every day | ORAL | Status: DC
  Administered 2020-05-04: 40 mg via ORAL
  Filled 2020-05-04: qty 2

## 2020-05-04 MED ORDER — LEVOTHYROXINE SODIUM 50 MCG PO TABS
150.0000 ug | ORAL_TABLET | Freq: Every day | ORAL | Status: DC
  Administered 2020-05-05: 150 ug via ORAL
  Filled 2020-05-04: qty 1

## 2020-05-04 MED ORDER — SODIUM CHLORIDE 0.9 % IV SOLN: INTRAVENOUS | Status: DC

## 2020-05-04 MED ORDER — AMLODIPINE BESYLATE 5 MG PO TABS
10.0000 mg | ORAL_TABLET | Freq: Every day | ORAL | Status: DC
  Administered 2020-05-04: 10 mg via ORAL
  Filled 2020-05-04: qty 2

## 2020-05-04 MED ORDER — OCUVITE-LUTEIN PO CAPS
1.0000 | ORAL_CAPSULE | Freq: Every day | ORAL | Status: DC
  Administered 2020-05-05: 1 via ORAL
  Filled 2020-05-04: qty 1

## 2020-05-04 MED ORDER — ENOXAPARIN SODIUM 40 MG/0.4ML ~~LOC~~ SOLN
40.0000 mg | SUBCUTANEOUS | Status: DC
  Administered 2020-05-04: 40 mg via SUBCUTANEOUS
  Filled 2020-05-04: qty 0.4

## 2020-05-04 MED ORDER — HYDRALAZINE HCL 20 MG/ML IJ SOLN: 5.0000 mg | INTRAMUSCULAR | Status: DC | PRN

## 2020-05-04 MED ORDER — ASPIRIN EC 81 MG PO TBEC
81.0000 mg | DELAYED_RELEASE_TABLET | Freq: Every day | ORAL | Status: DC
  Administered 2020-05-05: 81 mg via ORAL
  Filled 2020-05-04: qty 1

## 2020-05-04 MED ORDER — MAGNESIUM SULFATE 2 GM/50ML IV SOLN
2.0000 g | Freq: Once | INTRAVENOUS | Status: AC
  Administered 2020-05-04: 2 g via INTRAVENOUS
  Filled 2020-05-04: qty 50

## 2020-05-04 MED ORDER — SODIUM CHLORIDE 0.9% FLUSH: 3.0000 mL | Freq: Once | INTRAVENOUS | Status: DC

## 2020-05-04 MED ORDER — CALCIUM CITRATE-VITAMIN D 500-500 MG-UNIT PO CHEW
1.0000 | CHEWABLE_TABLET | Freq: Two times a day (BID) | ORAL | Status: DC
  Administered 2020-05-05: 09:00:00 1 via ORAL
  Filled 2020-05-04 (×3): qty 1

## 2020-05-04 NOTE — ED Provider Notes (Signed)
[facility] Emergency Department Provider Note  ____________________________________________   First MD Initiated Contact with Patient 05/04/20 1243     (approximate)  I have reviewed the triage vital signs and the nursing notes.   HISTORY  Chief Complaint Dizziness   HPI Alexa Pham is a 86 y.o. female with a past medical history of breast cancer status post lumpectomy not currently undergoing treatment, hypothyroidism, HTN, and macular degeneration who presents for assessment of feeling weak and dizzy which she describes as lightheadedness when she woke up this morning.  Patient denies any recent falls or injuries.  She states she felt fine when she was asleep last night.  She describes her dizziness as more feeling lightheaded than true vertigo.  She denies any vision changes, chest pain, cough, shortness of breath, vomiting, diarrhea, dysuria, abdominal pain, back pain, extremity pain, rash, or other acute complaints.  No prior similar episodes.  No clear alleviating or aggravating factors.  Patient states she is cannot been compliant with all her medications.  Denies tobacco abuse or EtOH abuse.         Past Medical History:  Diagnosis Date  . History of breast cancer   . History of pneumonia   . Hypertension   . Hypothyroidism   . Macular degeneration     Patient Active Problem List   Diagnosis Date Noted  . Dizziness 05/04/2020  . Elevated troponin 05/04/2020  . Hypomagnesemia 05/04/2020  . Hypotension 05/04/2020  . Shingles 01/10/2020  . Weight loss 05/10/2019  . Stress 04/19/2019  . Aortic atherosclerosis ([place]) 11/20/2018  . Abnormal CT lung screening 11/07/2018  . Bronchiectasis without complication ([place]) 16/07/9603  . Abnormal chest CT 10/19/2018  . Health care maintenance 07/06/2018  . History of breast cancer 12/01/2017  . Macular degeneration 12/01/2017  . Hypertension 11/28/2017  . Hypothyroidism 11/28/2017  .  Hypercholesterolemia 11/28/2017    Past Surgical History:  Procedure Laterality Date  . ABDOMINAL HYSTERECTOMY    . ABDOMINAL HYSTERECTOMY    . APPENDECTOMY    . BREAST LUMPECTOMY    . CATARACT EXTRACTION, BILATERAL    . CHOLECYSTECTOMY    . REPLACEMENT TOTAL KNEE    . TONSILLECTOMY AND ADENOIDECTOMY      Prior to Admission medications   Medication Sig Start Date End Date Taking? Authorizing Provider  amLODipine (NORVASC) 10 MG tablet TAKE 1 TABLET(10 MG) BY MOUTH DAILY 05/02/20  Yes Einar Pheasant, MD  calcium citrate-vitamin D (CITRACAL+D) 315-200 MG-UNIT tablet Take 1 tablet by mouth 2 (two) times daily.   Yes [provider]  irbesartan (AVAPRO) 75 MG tablet Take 1 tablet (75 mg total) by mouth daily. 01/11/20  Yes Einar Pheasant, MD  levothyroxine (SYNTHROID) 125 MCG tablet TAKE 1 TABLET BY MOUTH EVERY DAY BEFORE BREAKFAST 04/12/20  Yes Einar Pheasant, MD  metoprolol succinate (TOPROL-XL) 50 MG 24 hr tablet TAKE 1 TABLET BY MOUTH EVERY DAY WITH OR IMMEDIATELY FOLLOWING A MEAL 03/29/20  Yes Einar Pheasant, MD  Multiple Vitamin (MULTIVITAMIN) capsule Take 1 capsule by mouth daily.   Yes [provider]  Multiple Vitamins-Minerals (PRESERVISION AREDS 2 PO) Take by mouth.   Yes [provider]    Allergies Patient has no known allergies.  Family History  Problem Relation Age of Onset  . CAD Mother     Social History Social History   Tobacco Use  . Smoking status: Former Smoker    Packs/day: 1.50    Years: 6.00    Pack years:  9.00    Types: Cigarettes    Quit date: 1975    Years since quitting: 46.6  . Smokeless tobacco: Never Used  Substance Use Topics  . Alcohol use: No    Alcohol/week: 0.0 standard drinks  . Drug use: No    Review of Systems  Review of Systems  Constitutional: Positive for malaise/fatigue. Negative for chills and fever.  HENT: Negative for sore throat.   Eyes: Negative for pain.  Respiratory: Negative for cough and  stridor.   Cardiovascular: Negative for chest pain.  Gastrointestinal: Positive for nausea. Negative for vomiting.  Skin: Negative for rash.  Neurological: Positive for dizziness. Negative for seizures, loss of consciousness and headaches.  Psychiatric/Behavioral: Negative for suicidal ideas.  All other systems reviewed and are negative.     ____________________________________________   PHYSICAL EXAM:  VITAL SIGNS: ED Triage Vitals  Enc Vitals Group     BP 05/04/20 0801 (!) 107/52     Pulse Rate 05/04/20 0801 (!) 38     Resp 05/04/20 0801 16     Temp 05/04/20 0801 97.8 F (36.6 C)     Temp Source 05/04/20 0801 Oral     SpO2 05/04/20 0801 95 %     Weight 05/04/20 0804 113 lb 5.1 oz (51.4 kg)     Height 05/04/20 0804 5\' 6"  (1.676 m)     Head Circumference --      Peak Flow --      Pain Score 05/04/20 0804 0     Pain Loc --      Pain Edu? --      Excl. in [place]? --    Vitals:   05/04/20 1319 05/04/20 1435  BP: (!) 166/71 (!) 167/85  Pulse: 61   Resp: 22   Temp:    SpO2: 100%    Physical Exam Vitals and nursing note reviewed.  Constitutional:      General: She is not in acute distress.    Appearance: She is well-developed.  HENT:     Head: Normocephalic and atraumatic.     Right Ear: External ear normal.     Left Ear: External ear normal.     Nose: Nose normal.     Mouth/Throat:     Mouth: Mucous membranes are dry.  Eyes:     Conjunctiva/sclera: Conjunctivae normal.  Cardiovascular:     Rate and Rhythm: Normal rate and regular rhythm.     Heart sounds: No murmur heard.   Pulmonary:     Effort: Pulmonary effort is normal. No respiratory distress.     Breath sounds: Normal breath sounds.  Abdominal:     Palpations: Abdomen is soft.     Tenderness: There is no abdominal tenderness.  Musculoskeletal:     Cervical back: Neck supple.  Skin:    General: Skin is warm and dry.  Neurological:     General: No focal deficit present.     Mental Status: She is  alert and oriented to person, place, and time.  Psychiatric:        Mood and Affect: Mood normal.      ____________________________________________   LABS (all labs ordered are listed, but only abnormal results are displayed)  Labs Reviewed  BASIC METABOLIC PANEL - Abnormal; Notable for the following components:      Result Value   Glucose, Bld 124 (*)    BUN 28 (*)    Calcium 8.4 (*)    GFR calc non Af Amer 56 (*)  All other components within normal limits  MAGNESIUM - Abnormal; Notable for the following components:   Magnesium 1.6 (*)    All other components within normal limits  BRAIN NATRIURETIC PEPTIDE - Abnormal; Notable for the following components:   B Natriuretic Peptide 178.4 (*)    All other components within normal limits  HEPATIC FUNCTION PANEL - Abnormal; Notable for the following components:   Total Protein 5.7 (*)    Albumin 3.3 (*)    All other components within normal limits  TSH - Abnormal; Notable for the following components:   TSH 5.939 (*)    All other components within normal limits  TROPONIN I (HIGH SENSITIVITY) - Abnormal; Notable for the following components:   Troponin I (High Sensitivity) 52 (*)    All other components within normal limits  SARS CORONAVIRUS 2 BY RT PCR (HOSPITAL ORDER, [place] [facility])  CBC  URINALYSIS, COMPLETE (UACMP) WITH MICROSCOPIC  CBG MONITORING, ED  TROPONIN I (HIGH SENSITIVITY)   ____________________________________________  EKG  Sinus rhythm with a left anterior fascicle block, ventricular rate of 79, unremarkable intervals, left axis deviation, PVCs, and no other clear evidence of acute ischemia although nonspecific T wave changes in the anterior leads. ____________________________________________  RADIOLOGY  ED MD interpretation: No acute pneumonia, pneumothorax, significant edema, or other acute process.  Nodular opacities in the upper lung fields and left midlung field prior similar  to prior.  Official radiology report(s): DG Chest 2 View  Result Date: 05/04/2020 CLINICAL DATA:  Dizzy. Additional provided: Dizziness, low blood pressure, weakness, history of hypertension and breast cancer. EXAM: CHEST - 2 VIEW COMPARISON:  Chest CT 03/10/2019, chest CT 10/09/2018, chest radiographs 09/26/2018 FINDINGS: Heart size within normal limits. Aortic atherosclerosis. There are small nodular opacities within the right mid to upper lung field and left mid lung field which were present on prior examinations dating back to the chest x-ray of 09/26/2018. This may reflect sequela of chronic atypical infection. Please refer to CT chest 10/09/2018 for further description. No superimposed airspace consolidation or pulmonary edema. No evidence of pleural effusion or pneumothorax. No acute bony abnormality identified. Prominent calcification of the costal cartilage. Surgical clips within the left axilla and upper abdomen. IMPRESSION: Small nodular opacities are present within the right mid/upper lung field and left mid lung field. These findings were present on prior examinations dating back to the chest x-ray of 09/26/2018, and may reflect sequela of chronic atypical infection. Please refer to CT chest 10/09/2018 for further description. No superimposed airspace consolidation or pulmonary edema. Aortic Atherosclerosis (ICD10-I70.0). Electronically Signed   By: Kellie Simmering DO   On: 05/04/2020 14:06    ____________________________________________   PROCEDURES  Procedure(s) performed (including Critical Care):  Procedures   ____________________________________________   INITIAL IMPRESSION / ASSESSMENT AND PLAN / ED COURSE        Overall patient's history, exam, and ED work-up is concerning for possible symptomatic bradycardia related to ACS versus mild demand ischemia given nonspecific T wave changes and mildly elevated troponin.  Patient was given magnesium for hypomagnesemia although she  has no other significant electrolyte or metabolic arrangements and does not appear anemic.  Chest x-ray shows no evidence of pneumonia, edema, or other process to explain patient's dizziness.  I have low suspicion for CVA or or dissection at this time.  Will plan to admit to hospital service for further evaluation management.   Medications  sodium chloride flush (NS) 0.9 % injection 3 mL (3 mLs  Intravenous Not Given 05/04/20 1321)  magnesium sulfate IVPB 2 g 50 mL (2 g Intravenous New Bag/Given 05/04/20 1435)  aspirin tablet 325 mg (has no administration in time range)  amLODipine (NORVASC) tablet 5 mg (has no administration in time range)  0.9 %  sodium chloride infusion (has no administration in time range)  ondansetron (ZOFRAN) injection 4 mg (has no administration in time range)  acetaminophen (TYLENOL) tablet 650 mg (has no administration in time range)  hydrALAZINE (APRESOLINE) injection 5 mg (has no administration in time range)  enoxaparin (LOVENOX) injection 40 mg (has no administration in time range)        ____________________________________________   FINAL CLINICAL IMPRESSION(S) / ED DIAGNOSES  Final diagnoses:  Dizziness  Hypomagnesemia  Hypertension, unspecified type     ED Discharge Orders    None       Note:  This document was prepared using Dragon voice recognition software and may include unintentional dictation errors.   Lucrezia Starch, MD 05/04/20 [phone]

## 2020-05-04 NOTE — ED Triage Notes (Signed)
Pt in via EMS from home with c/o dizziness and weakness since 5am. 83/69 initial BP, HR 60-100, no cardiac hx. #18 g to left FA, was given 584ml and BP 107/69. 95%RA, HR 68.

## 2020-05-04 NOTE — ED Notes (Signed)
Dr Blaine Hamper paged to alert of critical troponin 220. To place new orders

## 2020-05-04 NOTE — ED Notes (Addendum)
Attempted to call report to floor, RN to call back with questions,

## 2020-05-04 NOTE — ED Notes (Signed)
Pt assisted to toilet, standby assist 

## 2020-05-04 NOTE — ED Notes (Signed)
Olivia Mackie, RN provided report. Pt in route by ED tech.

## 2020-05-04 NOTE — ED Notes (Signed)
See triage note, pt reports waking up at 0500 this morning with dizziness and weakness. Reports taking bp at home with result of 60 systolic. Alert and oriented. Equal grip and strength in all extremities.

## 2020-05-04 NOTE — H&P (Addendum)
History and Physical    Alexa Pham WCB:[id number] DOB: [birth year] DOA: 05/04/2020  Referring MD/NP/PA:   PCP: Einar Pheasant, MD   Patient coming from:  The patient is coming from home.  At baseline, pt is independent for most of ADL.        Chief Complaint: dizziness and generalized weakness  HPI: Alexa Pham is a 86 y.o. female with medical history significant of hypertension, hyperlipidemia, hypothyroidism, breast cancer (s/p of left lumpectomy), bronchiectasis, who presents with dizziness.  Patient states that her symptoms started this morning, including generalized weakness and dizziness.  She has lightheadedness. She does not have unilateral numbness or tingling in extremities.  No facial droop or slurred speech.  No hearing loss or difficulty speaking.  Patient has mild dry cough, no chest pain or shortness breath.  No fever or chills.  Patient had nausea, but no vomiting, diarrhea or abdominal pain.  No symptoms of UTI.  Patient states that she had low blood pressure at home with systolic blood pressure of 60s.  Per EMS, patient had blood pressure 83/69, and received 500 cc normal saline.  Her blood pressure is 107/52, then improved to 166/71 in ED.  She had bradycardia with heart rate 38 initially, then improved to 6180.   ED Course: pt was found to have troponin 52, BMP 178.4, negative COVID-19 PCR, electrolytes renal function okay temperature normal, RR 16, oxygen saturation 95% on room air.  Patient is placed on progressive bed of observation  CXR showed:  Small nodular opacities are present within the right mid/upper lung field and left mid lung field. These findings were present on prior examinations dating back to the chest x-ray of 09/26/2018, and may reflect sequela of chronic atypical infection. No superimposed airspace consolidation or pulmonary edema.   Review of Systems:   General: no fevers, chills, no body weight gain, has fatigue HEENT: no blurry vision,  hearing changes or sore throat Respiratory: no dyspnea, has coughing, no wheezing CV: no chest pain, no palpitations GI: no nausea, vomiting, abdominal pain, diarrhea, constipation GU: no dysuria, burning on urination, increased urinary frequency, hematuria  Ext: no leg edema Neuro: no unilateral weakness, numbness, or tingling, no vision change or hearing loss. Has dizziness. Skin: no rash, no skin tear. MSK: No muscle spasm, no deformity, no limitation of range of movement in spin Heme: No easy bruising.  Travel history: No recent long distant travel.  Allergy: No Known Allergies  Past Medical History:  Diagnosis Date  . History of breast cancer   . History of pneumonia   . Hypertension   . Hypothyroidism   . Macular degeneration     Past Surgical History:  Procedure Laterality Date  . ABDOMINAL HYSTERECTOMY    . ABDOMINAL HYSTERECTOMY    . APPENDECTOMY    . BREAST LUMPECTOMY    . CATARACT EXTRACTION, BILATERAL    . CHOLECYSTECTOMY    . REPLACEMENT TOTAL KNEE    . TONSILLECTOMY AND ADENOIDECTOMY      Social History:  reports that she quit smoking about 46 years ago. Her smoking use included cigarettes. She has a 9.00 pack-year smoking history. She has never used smokeless tobacco. She reports that she does not drink alcohol and does not use drugs.  Family History:  Family History  Problem Relation Age of Onset  . CAD Mother      Prior to Admission medications   Medication Sig Start Date End Date Taking? Authorizing Provider  amLODipine (NORVASC) 10 MG tablet  TAKE 1 TABLET(10 MG) BY MOUTH DAILY 05/02/20   Einar Pheasant, MD  calcium citrate-vitamin D (CITRACAL+D) 315-200 MG-UNIT tablet Take 1 tablet by mouth 2 (two) times daily.    [provider]  irbesartan (AVAPRO) 75 MG tablet Take 1 tablet (75 mg total) by mouth daily. 01/11/20   Einar Pheasant, MD  levothyroxine (SYNTHROID) 125 MCG tablet TAKE 1 TABLET BY MOUTH EVERY DAY BEFORE BREAKFAST 04/12/20   Einar Pheasant, MD  metoprolol succinate (TOPROL-XL) 50 MG 24 hr tablet TAKE 1 TABLET BY MOUTH EVERY DAY WITH OR IMMEDIATELY FOLLOWING A MEAL 03/29/20   Einar Pheasant, MD  Multiple Vitamin (MULTIVITAMIN) capsule Take 1 capsule by mouth daily.    [provider]  Multiple Vitamins-Minerals (PRESERVISION AREDS 2 PO) Take by mouth.    [provider]    Physical Exam: Vitals:   05/04/20 0801 05/04/20 0804 05/04/20 1319 05/04/20 1435  BP: (!) 107/52  (!) 166/71 (!) 167/85  Pulse: (!) 38  61   Resp: 16  22   Temp: 97.8 F (36.6 C)     TempSrc: Oral     SpO2: 95%  100%   Weight:  51.4 kg    Height:  5\' 6"  (1.676 m)     General: Not in acute distress HEENT:       Eyes: PERRL, EOMI, no scleral icterus.       ENT: No discharge from the ears and nose, no pharynx injection, no tonsillar enlargement.        Neck: No JVD, no bruit, no mass felt. Heme: No neck lymph node enlargement. Cardiac: S1/S2, RRR, No murmurs, No gallops or rubs. Respiratory: No rales, wheezing, rhonchi or rubs. GI: Soft, nondistended, nontender, no rebound pain, no organomegaly, BS present. GU: No hematuria Ext: No pitting leg edema bilaterally. 2+DP/PT pulse bilaterally. Musculoskeletal: No joint deformities, No joint redness or warmth, no limitation of ROM in spin. Skin: No rashes.  Neuro: Alert, oriented X3, cranial nerves II-XII grossly intact, moves all extremities normally. Muscle strength 5/5 in all extremities, sensation to light touch intact.  Psych: Patient is not psychotic, no suicidal or hemocidal ideation.  Labs on Admission: I have personally reviewed following labs and imaging studies  CBC: Recent Labs  Lab 05/04/20 0806  WBC 6.4  HGB 12.8  HCT 39.5  MCV 99.2  PLT 761   Basic Metabolic Panel: Recent Labs  Lab 05/04/20 0806  NA 143  K 3.5  CL 105  CO2 29  GLUCOSE 124*  BUN 28*  CREATININE 0.91  CALCIUM 8.4*  MG 1.6*   GFR: Estimated Creatinine Clearance: 34.7 mL/min  (by C-G formula based on SCr of 0.91 mg/dL). Liver Function Tests: Recent Labs  Lab 05/04/20 0806  AST 31  ALT 18  ALKPHOS 48  BILITOT 1.0  PROT 5.7*  ALBUMIN 3.3*   No results for input(s): LIPASE, AMYLASE in the last 168 hours. No results for input(s): AMMONIA in the last 168 hours. Coagulation Profile: No results for input(s): INR, PROTIME in the last 168 hours. Cardiac Enzymes: No results for input(s): CKTOTAL, CKMB, CKMBINDEX, TROPONINI in the last 168 hours. BNP (last 3 results) No results for input(s): PROBNP in the last 8760 hours. HbA1C: No results for input(s): HGBA1C in the last 72 hours. CBG: No results for input(s): GLUCAP in the last 168 hours. Lipid Profile: No results for input(s): CHOL, HDL, LDLCALC, TRIG, CHOLHDL, LDLDIRECT in the last 72 hours. Thyroid Function Tests: Recent Labs  05/04/20 0806  TSH 5.939*   Anemia Panel: No results for input(s): VITAMINB12, FOLATE, FERRITIN, TIBC, IRON, RETICCTPCT in the last 72 hours. Urine analysis: No results found for: COLORURINE, APPEARANCEUR, LABSPEC, PHURINE, GLUCOSEU, HGBUR, BILIRUBINUR, KETONESUR, PROTEINUR, UROBILINOGEN, NITRITE, LEUKOCYTESUR Sepsis Labs: @LABRCNTIP (procalcitonin:4,lacticidven:4) ) Recent Results (from the past 240 hour(s))  SARS Coronavirus 2 by RT PCR (hospital order, performed in [facility] hospital lab) Nasopharyngeal Nasopharyngeal Swab     Status: None   Collection Time: 05/04/20  1:23 PM   Specimen: Nasopharyngeal Swab  Result Value Ref Range Status   SARS Coronavirus 2 NEGATIVE NEGATIVE Final    Comment: (NOTE) SARS-CoV-2 target nucleic acids are NOT DETECTED.  The SARS-CoV-2 RNA is generally detectable in upper and lower respiratory specimens during the acute phase of infection. The lowest concentration of SARS-CoV-2 viral copies this assay can detect is 250 copies / mL. A negative result does not preclude SARS-CoV-2 infection and should not be used as the sole basis for  treatment or other patient management decisions.  A negative result may occur with improper specimen collection / handling, submission of specimen other than nasopharyngeal swab, presence of viral mutation(s) within the areas targeted by this assay, and inadequate number of viral copies (<250 copies / mL). A negative result must be combined with clinical observations, patient history, and epidemiological information.  Fact Sheet for Patients:   StrictlyIdeas.no  Fact Sheet for Healthcare Providers: BankingDealers.co.za  This test is not yet approved or  cleared by the [place] FDA and has been authorized for detection and/or diagnosis of SARS-CoV-2 by FDA under an Emergency Use Authorization (EUA).  This EUA will remain in effect (meaning this test can be used) for the duration of the COVID-19 declaration under Section 564(b)(1) of the Act, 21 U.S.C. section 360bbb-3(b)(1), unless the authorization is terminated or revoked sooner.  Performed at [facility], [address], [place], [place] [address]      Radiological Exams on Admission: DG Chest 2 View  Result Date: 05/04/2020 CLINICAL DATA:  Dizzy. Additional provided: Dizziness, low blood pressure, weakness, history of hypertension and breast cancer. EXAM: CHEST - 2 VIEW COMPARISON:  Chest CT 03/10/2019, chest CT 10/09/2018, chest radiographs 09/26/2018 FINDINGS: Heart size within normal limits. Aortic atherosclerosis. There are small nodular opacities within the right mid to upper lung field and left mid lung field which were present on prior examinations dating back to the chest x-ray of 09/26/2018. This may reflect sequela of chronic atypical infection. Please refer to CT chest 10/09/2018 for further description. No superimposed airspace consolidation or pulmonary edema. No evidence of pleural effusion or pneumothorax. No acute bony abnormality identified. Prominent  calcification of the costal cartilage. Surgical clips within the left axilla and upper abdomen. IMPRESSION: Small nodular opacities are present within the right mid/upper lung field and left mid lung field. These findings were present on prior examinations dating back to the chest x-ray of 09/26/2018, and may reflect sequela of chronic atypical infection. Please refer to CT chest 10/09/2018 for further description. No superimposed airspace consolidation or pulmonary edema. Aortic Atherosclerosis (ICD10-I70.0). Electronically Signed   By: Kellie Simmering DO   On: 05/04/2020 14:06     EKG: Independently reviewed.  Sinus rhythm, QTC 449, low voltage, LAD, poor R wave progression  Assessment/Plan Principal Problem:   Dizziness Active Problems:   Hypertension   Hypothyroidism   Hypercholesterolemia   Elevated troponin   Hypomagnesemia   Hypotension   Dizziness: Etiology is not clear.  Patient  does not have focal neuro deficit on physical examination.  Denies unilateral numbness or tingling in extremities.  No facial droop or slurred speech.  No hearing loss or vision change.  Low suspicions for stroke.  Patient had hypotension and bradycardia, which are the likely underlying etiology.  Blood pressure responded to IV fluid quickly.  Currently patient is hypertensive with blood pressure 166/71.  No fever or leukocytosis.  No signs of infection.  Low suspicions for sepsis.  Possibly due to dehydration and continuation of blood pressure medications.  Patient is taking irbesartan, amlodipine and metoprolol.  Patient had bradycardia with heart rate of 38 initially.  Currently heart rate is 60s.  -Placed on progressive bed for observation -Check orthostatic vital sign -IV fluid: Patient received 500 cc normal saline earlier, will continue 75 cc/h -Decrease amlodipine dose from 10 mg to 5 mg daily -Hold irbesartan -Hold metoprolol due to bradycardia -PT/OT  Hypotension: -See above -Hold irbesartan and  metoprolol -IV fluid  Hypertension -Hold metoprolol, irbesartan -IV hydralazine as needed -Amlodipine 5 mg daily  Hypothyroidism: TSH 5.939 -Increase Synthroid dose from 125 to 150 mcg daily -Recheck TSH in 6 weeks, f/u with PCP  Elevated troponin: Troponin 52.  No chest pain or shortness of breath.  Possibly due to demand ischemia -Check A1c, FLP -Trend troponin -Repeat EKG in the morning -aspirin  Addendum: trop is trending up from 52 -->220. -will start lipitor 40 mg daily -will consult Dr. Nehemiah Massed of cardiology  Hypercholesterolemia: pt is not taking meds at home. -f/u FLP  Hypomagnesemia: Magnesium 1.6 -Repleted magnesium      DVT ppx:  SQ Lovenox Code Status: Full code Family Communication: not done, no family member is at bed side.     Disposition Plan:  Anticipate discharge back to previous environment Consults called:  Dr. Nehemiah Massed of cardiology Admission status:   progressive unit for obs    Status is: Observation  The patient remains OBS appropriate and will d/c before 2 midnights.  Dispo: The patient is from: Home              Anticipated d/c is to: Home              Anticipated d/c date is: 1 day              Patient currently is not medically stable to d/c.          Date of Service 05/04/2020    Ivor [facility]   If 7PM-7AM, please contact night-coverage [email or web address] 05/04/2020, 4:50 PM

## 2020-05-04 NOTE — Progress Notes (Signed)
Pt received to 2A room 245 from ED.  Pt oriented to room and call bell.  Pt denies pain or distress.  See assessment and vs's.  NSR.

## 2020-05-04 NOTE — ED Notes (Signed)
Pt given sandwich tray, call bell within reach, stretcher locked in lowest position

## 2020-05-04 NOTE — Plan of Care (Signed)

## 2020-05-05 DIAGNOSIS — I952 Hypotension due to drugs: Secondary | ICD-10-CM

## 2020-05-05 DIAGNOSIS — R42 Dizziness and giddiness: Secondary | ICD-10-CM | POA: Diagnosis not present

## 2020-05-05 LAB — MAGNESIUM: Magnesium: 2 mg/dL (ref 1.7–2.4)

## 2020-05-05 LAB — URINALYSIS, COMPLETE (UACMP) WITH MICROSCOPIC
Bacteria, UA: NONE SEEN
Bilirubin Urine: NEGATIVE
Glucose, UA: NEGATIVE mg/dL
Hgb urine dipstick: NEGATIVE
Ketones, ur: NEGATIVE mg/dL
Leukocytes,Ua: NEGATIVE
Nitrite: NEGATIVE
Protein, ur: NEGATIVE mg/dL
Specific Gravity, Urine: 1.008 (ref 1.005–1.030)
Squamous Epithelial / HPF: NONE SEEN (ref 0–5)
pH: 7 (ref 5.0–8.0)

## 2020-05-05 LAB — BASIC METABOLIC PANEL
Anion gap: 10 (ref 5–15)
BUN: 21 mg/dL (ref 8–23)
CO2: 30 mmol/L (ref 22–32)
Calcium: 7.6 mg/dL — ABNORMAL LOW (ref 8.9–10.3)
Chloride: 103 mmol/L (ref 98–111)
Creatinine, Ser: 0.68 mg/dL (ref 0.44–1.00)
GFR calc Af Amer: >60 mL/min (ref >60)
GFR calc non Af Amer: >60 mL/min (ref >60)
Glucose, Bld: 83 mg/dL (ref 70–99)
Potassium: 3.3 mmol/L — ABNORMAL LOW (ref 3.5–5.1)
Sodium: 143 mmol/L (ref 135–145)

## 2020-05-05 LAB — HEMOGLOBIN A1C
Hgb A1c MFr Bld: 5.5 % (ref 4.8–5.6)
Mean Plasma Glucose: 111.15 mg/dL

## 2020-05-05 LAB — LIPID PANEL
Cholesterol: 175 mg/dL (ref 0–200)
HDL: 61 mg/dL (ref >40)
LDL Cholesterol: 103 mg/dL — ABNORMAL HIGH (ref 0–99)
Total CHOL/HDL Ratio: 2.9 RATIO
Triglycerides: 57 mg/dL (ref <150)
VLDL: 11 mg/dL (ref 0–40)

## 2020-05-05 LAB — CBC
HCT: 37.9 % (ref 36.0–46.0)
Hemoglobin: 12.9 g/dL (ref 12.0–15.0)
MCH: 32.3 pg (ref 26.0–34.0)
MCHC: 34 g/dL (ref 30.0–36.0)
MCV: 94.8 fL (ref 80.0–100.0)
Platelets: 209 10*3/uL (ref 150–400)
RBC: 4 MIL/uL (ref 3.87–5.11)
RDW: 15.1 % (ref 11.5–15.5)
WBC: 5.5 10*3/uL (ref 4.0–10.5)
nRBC: 0 % (ref 0.0–0.2)

## 2020-05-05 MED ORDER — ATORVASTATIN CALCIUM 40 MG PO TABS: 40.0000 mg | ORAL_TABLET | Freq: Every day | ORAL | 1 refills | Status: DC

## 2020-05-05 MED ORDER — ASPIRIN 81 MG PO TBEC: 81.0000 mg | DELAYED_RELEASE_TABLET | Freq: Every day | ORAL | 11 refills | Status: DC

## 2020-05-05 MED ORDER — POTASSIUM CHLORIDE CRYS ER 20 MEQ PO TBCR
40.0000 meq | EXTENDED_RELEASE_TABLET | Freq: Once | ORAL | Status: AC
  Administered 2020-05-05: 40 meq via ORAL
  Filled 2020-05-05: qty 2

## 2020-05-05 MED ORDER — AMLODIPINE BESYLATE 5 MG PO TABS: 5.0000 mg | ORAL_TABLET | Freq: Every day | ORAL | 1 refills | Status: DC

## 2020-05-05 NOTE — Consult Note (Signed)
[facility] Consultation Note  [name], MRN: [id number], DOB/AGE: [birth year] 86 y.o. Admit date: 05/04/2020   Date of Consult: 05/05/2020 Primary Physician: Einar Pheasant, MD Primary Cardiologist: None  Chief Complaint:  Chief Complaint  Patient presents with  . Dizziness   Reason for Consult: Elevated troponin hypertension hyperlipidemia  HPI: 86 y.o. female with history of hypertension hyperlipidemia but no evidence of previous cardiovascular disease or congestive heart failure for which she is done fairly well with risk factor management.  The patient awakened with some significant nausea weakness and fatigue for which she said was uncommon.  She checked her blood pressure and it was quite low and feels still very weak.  She did call the EMS for which gave her IV fluids and further IV fluids when she was remaining here at the hospital.  With this IV fluid is the facial patient feels much better at this time with no further significant symptoms and feels back to normal.  This may be secondary to medication management issues as well as dehydration.  Currently the patient has an EKG showing normal sinus rhythm at 66 bpm otherwise normal.  Troponin is 228 but flat for 3 readings of unknown etiology but no current evidence of myocardial infarction congestive heart failure or anginal symptoms.  Patient is hemodynamically stable at this time  Past Medical History:  Diagnosis Date  . History of breast cancer   . History of pneumonia   . Hypertension   . Hypothyroidism   . Macular degeneration       Surgical History:  Past Surgical History:  Procedure Laterality Date  . ABDOMINAL HYSTERECTOMY    . ABDOMINAL HYSTERECTOMY    . APPENDECTOMY    . BREAST LUMPECTOMY    . CATARACT EXTRACTION, BILATERAL    . CHOLECYSTECTOMY    . REPLACEMENT TOTAL KNEE    . TONSILLECTOMY AND ADENOIDECTOMY       Home Meds: Prior to Admission medications   Medication Sig Start  Date End Date Taking? Authorizing Provider  amLODipine (NORVASC) 10 MG tablet TAKE 1 TABLET(10 MG) BY MOUTH DAILY 05/02/20  Yes Einar Pheasant, MD  calcium citrate-vitamin D (CITRACAL+D) 315-200 MG-UNIT tablet Take 1 tablet by mouth 2 (two) times daily.   Yes [provider]  irbesartan (AVAPRO) 75 MG tablet Take 1 tablet (75 mg total) by mouth daily. 01/11/20  Yes Einar Pheasant, MD  levothyroxine (SYNTHROID) 125 MCG tablet TAKE 1 TABLET BY MOUTH EVERY DAY BEFORE BREAKFAST 04/12/20  Yes Einar Pheasant, MD  metoprolol succinate (TOPROL-XL) 50 MG 24 hr tablet TAKE 1 TABLET BY MOUTH EVERY DAY WITH OR IMMEDIATELY FOLLOWING A MEAL 03/29/20  Yes Einar Pheasant, MD  Multiple Vitamin (MULTIVITAMIN) capsule Take 1 capsule by mouth daily.   Yes [provider]  Multiple Vitamins-Minerals (PRESERVISION AREDS 2 PO) Take by mouth.   Yes [provider]    Inpatient Medications:  . amLODipine  5 mg Oral Daily  . aspirin EC  81 mg Oral Daily  . atorvastatin  40 mg Oral Daily  . calcium citrate-vitamin D  1 tablet Oral BID  . enoxaparin (LOVENOX) injection  40 mg Subcutaneous Q24H  . levothyroxine  150 mcg Oral Q0600  . multivitamin with minerals  1 tablet Oral Daily  . multivitamin-lutein  1 capsule Oral Daily  . sodium chloride flush  3 mL Intravenous Once   . sodium chloride 75 mL/hr at 05/05/20 0506    Allergies: No Known Allergies  Social History   Socioeconomic History  . Marital status: Married    Spouse name: Not on file  . Number of children: Not on file  . Years of education: Not on file  . Highest education level: Not on file  Occupational History  . Not on file  Tobacco Use  . Smoking status: Former Smoker    Packs/day: 1.50    Years: 6.00    Pack years: 9.00    Types: Cigarettes    Quit date: 1975    Years since quitting: 46.6  . Smokeless tobacco: Never Used  Substance and Sexual Activity  . Alcohol use: No    Alcohol/week: 0.0 standard drinks   . Drug use: No  . Sexual activity: Not on file  Other Topics Concern  . Not on file  Social History Narrative   Married    Social Determinants of Health   Financial Resource Strain: Low Risk   . Difficulty of Paying Living Expenses: Not hard at all  Food Insecurity: No Food Insecurity  . Worried About Charity fundraiser in the Last Year: Never true  . Ran Out of Food in the Last Year: Never true  Transportation Needs: No Transportation Needs  . Lack of Transportation (Medical): No  . Lack of Transportation (Non-Medical): No  Physical Activity: Unknown  . Days of Exercise per Week: 0 days  . Minutes of Exercise per Session: Not on file  Stress: No Stress Concern Present  . Feeling of Stress : Not at all  Social Connections:   . Frequency of Communication with Friends and Family:   . Frequency of Social Gatherings with Friends and Family:   . Attends Religious Services:   . Active Member of Clubs or Organizations:   . Attends Archivist Meetings:   Marland Kitchen Marital Status:   Intimate Partner Violence: Not At Risk  . Fear of Current or Ex-Partner: No  . Emotionally Abused: No  . Physically Abused: No  . Sexually Abused: No     Family History  Problem Relation Age of Onset  . CAD Mother      Review of Systems Positive for weakness fatigue Negative for: General:  chills, fever, night sweats or weight changes.  Cardiovascular: PND orthopnea syncope dizziness  Dermatological skin lesions rashes Respiratory: Cough congestion Urologic: Frequent urination urination at night and hematuria Abdominal: negative for nausea, vomiting, diarrhea, bright red blood per rectum, melena, or hematemesis Neurologic: negative for visual changes, and/or hearing changes  All other systems reviewed and are otherwise negative except as noted above.  Labs: No results for input(s): CKTOTAL, CKMB, TROPONINI in the last 72 hours. Lab Results  Component Value Date   WBC 5.5 05/05/2020    HGB 12.9 05/05/2020   HCT 37.9 05/05/2020   MCV 94.8 05/05/2020   PLT 209 05/05/2020    Recent Labs  Lab 05/04/20 0806 05/04/20 0806 05/05/20 0433  NA 143   < > 143  K 3.5   < > 3.3*  CL 105   < > 103  CO2 29   < > 30  BUN 28*   < > 21  CREATININE 0.91   < > 0.68  CALCIUM 8.4*   < > 7.6*  PROT 5.7*  --   --   BILITOT 1.0  --   --   ALKPHOS 48  --   --   ALT 18  --   --   AST 31  --   --  GLUCOSE 124*   < > 83   < > = values in this interval not displayed.   Lab Results  Component Value Date   CHOL 175 05/05/2020   HDL 61 05/05/2020   LDLCALC 103 (H) 05/05/2020   TRIG 57 05/05/2020   No results found for: DDIMER  Radiology/Studies:  DG Chest 2 View  Result Date: 05/04/2020 CLINICAL DATA:  Dizzy. Additional provided: Dizziness, low blood pressure, weakness, history of hypertension and breast cancer. EXAM: CHEST - 2 VIEW COMPARISON:  Chest CT 03/10/2019, chest CT 10/09/2018, chest radiographs 09/26/2018 FINDINGS: Heart size within normal limits. Aortic atherosclerosis. There are small nodular opacities within the right mid to upper lung field and left mid lung field which were present on prior examinations dating back to the chest x-ray of 09/26/2018. This may reflect sequela of chronic atypical infection. Please refer to CT chest 10/09/2018 for further description. No superimposed airspace consolidation or pulmonary edema. No evidence of pleural effusion or pneumothorax. No acute bony abnormality identified. Prominent calcification of the costal cartilage. Surgical clips within the left axilla and upper abdomen. IMPRESSION: Small nodular opacities are present within the right mid/upper lung field and left mid lung field. These findings were present on prior examinations dating back to the chest x-ray of 09/26/2018, and may reflect sequela of chronic atypical infection. Please refer to CT chest 10/09/2018 for further description. No superimposed airspace consolidation or pulmonary  edema. Aortic Atherosclerosis (ICD10-I70.0). Electronically Signed   By: Kellie Simmering DO   On: 05/04/2020 14:06    EKG: Normal sinus rhythm otherwise normal EKG  Weights: Filed Weights   05/04/20 0804 05/04/20 2204  Weight: 51.4 kg 52 kg     Physical Exam: Blood pressure (!) 136/63, pulse 69, temperature 97.7 F (36.5 C), temperature source Oral, resp. rate 20, height 5\' 6"  (1.676 m), weight 52 kg, SpO2 98 %. Body mass index is 18.5 kg/m. General: Well developed, well nourished, in no acute distress. Head eyes ears nose throat: Normocephalic, atraumatic, sclera non-icteric, no xanthomas, nares are without discharge. No apparent thyromegaly and/or mass  Lungs: Normal respiratory effort.  no wheezes, no rales, no rhonchi.  Heart: RRR with normal S1 S2. no murmur gallop, no rub, PMI is normal size and placement, carotid upstroke normal without bruit, jugular venous pressure is normal Abdomen: Soft, non-tender, non-distended with normoactive bowel sounds. No hepatomegaly. No rebound/guarding. No obvious abdominal masses. Abdominal aorta is normal size without bruit Extremities: No edema. no cyanosis, no clubbing, no ulcers  Peripheral : 2+ bilateral upper extremity pulses, 2+ bilateral femoral pulses, 2+ bilateral dorsal pedal pulse Neuro: Alert and oriented. No facial asymmetry. No focal deficit. Moves all extremities spontaneously. Musculoskeletal: Normal muscle tone without kyphosis Psych:  Responds to questions appropriately with a normal affect.    Assessment: 86 year old female with hypertension hyperlipidemia with a weakness nausea and other constitutional symptoms which have completely resolved with IV fluids and no current evidence of clinical angina congestive heart failure or myocardial infarction with flat troponins not currently consistent with acute coronary syndrome  Plan: 1.  Continue of supportive therapy for possible hypotension weakness fatigue and dehydration 2.  No  further cardiac intervention and/or diagnostics necessary at this time due to no clinical congestive heart failure acute coronary syndrome and or angina 3.  Continue medication management for high intensity cholesterol therapy 4.  Consideration of discontinuation of amlodipine due to concerns of possible side effects of this medication with reassessment as an outpatient as necessary 5.  Okay for discharge home from cardiac standpoint if ambulating well with follow-up in 1 to 2 weeks for further evaluation and treatment options as per above  Signed, Corey Skains M.D. [facility] 05/05/2020, 8:55 AM

## 2020-05-05 NOTE — Progress Notes (Signed)
Pt was discharged at this time. PiV was removed. AVS instructions were provided. Pt conveyed understanding. Pt was transported off the unit via wheelchair, son accompanied pt home. Son was reminded that pt has to pick up prescriptions from pharmacy.

## 2020-05-05 NOTE — Discharge Summary (Signed)
Physician Discharge Summary  [name] DGU:[id number] DOB: 08-13-1930 DOA: 05/04/2020  PCP: Einar Pheasant, MD  Admit date: 05/04/2020 Discharge date: 05/05/2020  Admitted From: home Disposition:  home  Recommendations for Outpatient Follow-up:  1. Follow up with PCP in 1-2 weeks 2. Please obtain BMP/CBC in one week 3. Please follow up on patient's blood pressure and regimen of medications.  She was given instructions to hold BP medications if her BP is less than 120/65, to prevent dizziness, syncope or falls.  Amlodipine was reduced to 5 mg.  Home Health: No  Equipment/Devices: None   Discharge Condition: Stable  CODE STATUS: Full  Diet recommendation: Heart Healthy    Discharge Diagnoses: Principal Problem:   Dizziness Active Problems:   Hypertension   Hypothyroidism   Hypercholesterolemia   Elevated troponin   Hypomagnesemia   Hypotension    Summary of HPI and Hospital Course:  Alexa Pham is a 86 y.o. female with medical history significant of hypertension, hyperlipidemia, hypothyroidism, breast cancer (s/p of left lumpectomy), bronchiectasis, who presented to the ED on 05/04/20 with dizziness, lightheadedness and generalized weakness.  She reported low BP at home with systolic in 95'G, and EMS reported BP on their arrival was 83/69 which responded well to 500 cc fluid bolus.     In the ED, afebrile, She was initially bradycardic with HR of 38 as well.  BP improved in the ED where she was later hypertensive 166/71.  Labs notable for troponin 52 >> 220, BNP 178.4, TSH mildly elevated 5.939, hypomagnesemia 1.6, UA negative for infection.  Covid-19 PCR negative.   Chest xray showed small nodular opacities are present within the right mid/upper lung field and left mid lung field that are chronic (seen on films back in 2019), no superimposed airspace consolidations or pulmonary edema.    Given hold instructions for BP meds if less than 120/65.  Dizziness: Most likely due to  hypotension and bradycardia in setting of mild dehydration.   Neuro exam nonfocal and within normal, unlikely CVA.  No leukocytosis, fever or other evidence of infection. Blood pressure responded to IV fluid quickly and dizziness resolved.   Home BP meds include irbesartan, amlodipine and metoprolol.  Admitted for observation on [facility]. Orthostatic vitals unremarkable. Decrease amlodipine dose from 10 mg to 5 mg daily Held irbesartan and metoprolol on admission.  These were resumed once BP and HR improved.   Hypotension: Resolved with IV hydration.  Discharge Instructions   Discharge Instructions    Call MD for:  extreme fatigue   Complete by: As directed    Call MD for:  persistant dizziness or light-headedness   Complete by: As directed    Call MD for:  temperature >100.4   Complete by: As directed    Diet - low sodium heart healthy   Complete by: As directed    Discharge instructions   Complete by: As directed    It appears that your dizziness was mostly likely due to low blood pressure and low heart rate and dehydration.      Please take your blood pressure at home every day, BEFORE taking your blood pressure medications (amlodipine, metoprolol, irbesartan).  If top BP less than 120 OR bottom number is less than 65, then you should hold off on BP medications.   Also, if your heart rate is 60 beats/minute or less, please hold lisinopril. If you have to hold medications, you should call your primary care doctor to discuss changes to your medications.  Increase activity slowly   Complete by: As directed      Allergies as of 05/05/2020   No Known Allergies     Medication List    TAKE these medications   amLODipine 5 MG tablet Commonly known as: NORVASC Take 1 tablet (5 mg total) by mouth daily. Start taking on: May 06, 2020 What changed:   medication strength  See the new instructions.   aspirin 81 MG EC tablet Take 1 tablet (81 mg total) by mouth  daily. Swallow whole. Start taking on: May 06, 2020   atorvastatin 40 MG tablet Commonly known as: LIPITOR Take 1 tablet (40 mg total) by mouth daily.   calcium citrate-vitamin D 315-200 MG-UNIT tablet Commonly known as: CITRACAL+D Take 1 tablet by mouth 2 (two) times daily.   irbesartan 75 MG tablet Commonly known as: Avapro Take 1 tablet (75 mg total) by mouth daily.   levothyroxine 125 MCG tablet Commonly known as: SYNTHROID TAKE 1 TABLET BY MOUTH EVERY DAY BEFORE BREAKFAST   metoprolol succinate 50 MG 24 hr tablet Commonly known as: TOPROL-XL TAKE 1 TABLET BY MOUTH EVERY DAY WITH OR IMMEDIATELY FOLLOWING A MEAL   multivitamin capsule Take 1 capsule by mouth daily.   PRESERVISION AREDS 2 PO Take by mouth.       Follow-up Information    Corey Skains, MD Follow up in 1 week(s).   Specialty: Cardiology Contact information: [address] [place] [address] [phone]              No Known Allergies  Consultations:  Cardiology   Procedures/Studies: DG Chest 2 View  Result Date: 05/04/2020 CLINICAL DATA:  Dizzy. Additional provided: Dizziness, low blood pressure, weakness, history of hypertension and breast cancer. EXAM: CHEST - 2 VIEW COMPARISON:  Chest CT 03/10/2019, chest CT 10/09/2018, chest radiographs 09/26/2018 FINDINGS: Heart size within normal limits. Aortic atherosclerosis. There are small nodular opacities within the right mid to upper lung field and left mid lung field which were present on prior examinations dating back to the chest x-ray of 09/26/2018. This may reflect sequela of chronic atypical infection. Please refer to CT chest 10/09/2018 for further description. No superimposed airspace consolidation or pulmonary edema. No evidence of pleural effusion or pneumothorax. No acute bony abnormality identified. Prominent calcification of the costal cartilage. Surgical clips within the left axilla and  upper abdomen. IMPRESSION: Small nodular opacities are present within the right mid/upper lung field and left mid lung field. These findings were present on prior examinations dating back to the chest x-ray of 09/26/2018, and may reflect sequela of chronic atypical infection. Please refer to CT chest 10/09/2018 for further description. No superimposed airspace consolidation or pulmonary edema. Aortic Atherosclerosis (ICD10-I70.0). Electronically Signed   By: Kellie Simmering DO   On: 05/04/2020 14:06       Subjective: Patient says feeling better this AM.  Denies dizziness or lightheadedness.  Agreeable to ambulating with PT and check orthostatic vitals prior to discharge.  NO other complaints.    Discharge Exam: Vitals:   05/05/20 1214 05/05/20 1216  BP: (!) 160/74 (!) 145/68  Pulse: 75 80  Resp:    Temp:    SpO2: 100% 100%   Vitals:   05/05/20 0816 05/05/20 1212 05/05/20 1214 05/05/20 1216  BP: (!) 136/63 (!) 180/83 (!) 160/74 (!) 145/68  Pulse: 69 76 75 80  Resp:      Temp: 97.7 F (36.5 C)     TempSrc:  Oral     SpO2: 98% 95% 100% 100%  Weight:      Height:        General: Pt is alert, awake, not in acute distress Cardiovascular: RRR, S1/S2 +, no rubs, no gallops Respiratory: CTA bilaterally, no wheezing, no rhonchi Abdominal: Soft, NT, ND, bowel sounds + Extremities: no edema, no cyanosis    The results of significant diagnostics from this hospitalization (including imaging, microbiology, ancillary and laboratory) are listed below for reference.     Microbiology: Recent Results (from the past 240 hour(s))  SARS Coronavirus 2 by RT PCR (hospital order, performed in [facility] hospital lab) Nasopharyngeal Nasopharyngeal Swab     Status: None   Collection Time: 05/04/20  1:23 PM   Specimen: Nasopharyngeal Swab  Result Value Ref Range Status   SARS Coronavirus 2 NEGATIVE NEGATIVE Final    Comment: (NOTE) SARS-CoV-2 target nucleic acids are NOT DETECTED.  The SARS-CoV-2  RNA is generally detectable in upper and lower respiratory specimens during the acute phase of infection. The lowest concentration of SARS-CoV-2 viral copies this assay can detect is 250 copies / mL. A negative result does not preclude SARS-CoV-2 infection and should not be used as the sole basis for treatment or other patient management decisions.  A negative result may occur with improper specimen collection / handling, submission of specimen other than nasopharyngeal swab, presence of viral mutation(s) within the areas targeted by this assay, and inadequate number of viral copies (<250 copies / mL). A negative result must be combined with clinical observations, patient history, and epidemiological information.  Fact Sheet for Patients:   StrictlyIdeas.no  Fact Sheet for Healthcare Providers: BankingDealers.co.za  This test is not yet approved or  cleared by the [place] FDA and has been authorized for detection and/or diagnosis of SARS-CoV-2 by FDA under an Emergency Use Authorization (EUA).  This EUA will remain in effect (meaning this test can be used) for the duration of the COVID-19 declaration under Section 564(b)(1) of the Act, 21 U.S.C. section 360bbb-3(b)(1), unless the authorization is terminated or revoked sooner.  Performed at [facility], [address] [place], [place] [address]      Labs: BNP (last 3 results) Recent Labs    05/04/20 0806  BNP 258.5*   Basic Metabolic Panel: Recent Labs  Lab 05/04/20 0806 05/05/20 0433  NA 143 143  K 3.5 3.3*  CL 105 103  CO2 29 30  GLUCOSE 124* 83  BUN 28* 21  CREATININE 0.91 0.68  CALCIUM 8.4* 7.6*  MG 1.6* 2.0   Liver Function Tests: Recent Labs  Lab 05/04/20 0806  AST 31  ALT 18  ALKPHOS 48  BILITOT 1.0  PROT 5.7*  ALBUMIN 3.3*   No results for input(s): LIPASE, AMYLASE in the last 168 hours. No results for input(s): AMMONIA in the last  168 hours. CBC: Recent Labs  Lab 05/04/20 0806 05/05/20 0433  WBC 6.4 5.5  HGB 12.8 12.9  HCT 39.5 37.9  MCV 99.2 94.8  PLT 218 209   Cardiac Enzymes: No results for input(s): CKTOTAL, CKMB, CKMBINDEX, TROPONINI in the last 168 hours. BNP: Invalid input(s): POCBNP CBG: No results for input(s): GLUCAP in the last 168 hours. D-Dimer No results for input(s): DDIMER in the last 72 hours. Hgb A1c Recent Labs    05/05/20 0433  HGBA1C 5.5   Lipid Profile Recent Labs    05/05/20 0433  CHOL 175  HDL 61  LDLCALC 103*  TRIG 57  CHOLHDL 2.9   Thyroid function studies Recent Labs    05/04/20 0806  TSH 5.939*   Anemia work up No results for input(s): VITAMINB12, FOLATE, FERRITIN, TIBC, IRON, RETICCTPCT in the last 72 hours. Urinalysis    Component Value Date/Time   COLORURINE STRAW (A) 05/05/2020 0200   APPEARANCEUR CLEAR (A) 05/05/2020 0200   LABSPEC 1.008 05/05/2020 0200   PHURINE 7.0 05/05/2020 0200   GLUCOSEU NEGATIVE 05/05/2020 0200   HGBUR NEGATIVE 05/05/2020 0200   BILIRUBINUR NEGATIVE 05/05/2020 0200   KETONESUR NEGATIVE 05/05/2020 0200   PROTEINUR NEGATIVE 05/05/2020 0200   NITRITE NEGATIVE 05/05/2020 0200   LEUKOCYTESUR NEGATIVE 05/05/2020 0200   Sepsis Labs Invalid input(s): PROCALCITONIN,  WBC,  LACTICIDVEN Microbiology Recent Results (from the past 240 hour(s))  SARS Coronavirus 2 by RT PCR (hospital order, performed in [facility] hospital lab) Nasopharyngeal Nasopharyngeal Swab     Status: None   Collection Time: 05/04/20  1:23 PM   Specimen: Nasopharyngeal Swab  Result Value Ref Range Status   SARS Coronavirus 2 NEGATIVE NEGATIVE Final    Comment: (NOTE) SARS-CoV-2 target nucleic acids are NOT DETECTED.  The SARS-CoV-2 RNA is generally detectable in upper and lower respiratory specimens during the acute phase of infection. The lowest concentration of SARS-CoV-2 viral copies this assay can detect is 250 copies / mL. A negative result does not  preclude SARS-CoV-2 infection and should not be used as the sole basis for treatment or other patient management decisions.  A negative result may occur with improper specimen collection / handling, submission of specimen other than nasopharyngeal swab, presence of viral mutation(s) within the areas targeted by this assay, and inadequate number of viral copies (<250 copies / mL). A negative result must be combined with clinical observations, patient history, and epidemiological information.  Fact Sheet for Patients:   StrictlyIdeas.no  Fact Sheet for Healthcare Providers: BankingDealers.co.za  This test is not yet approved or  cleared by the [place] FDA and has been authorized for detection and/or diagnosis of SARS-CoV-2 by FDA under an Emergency Use Authorization (EUA).  This EUA will remain in effect (meaning this test can be used) for the duration of the COVID-19 declaration under Section 564(b)(1) of the Act, 21 U.S.C. section 360bbb-3(b)(1), unless the authorization is terminated or revoked sooner.  Performed at [facility], [address] [place],  [address]      Time coordinating discharge: Over 30 minutes  SIGNED:   Ezekiel Slocumb, DO [facility] 05/05/2020, 3:28 PM   If 7PM-7AM, please contact night-coverage [email or web address]

## 2020-05-05 NOTE — Hospital Course (Signed)
Alexa Pham is a 86 y.o. female with medical history significant of hypertension, hyperlipidemia, hypothyroidism, breast cancer (s/p of left lumpectomy), bronchiectasis, who presented to the ED on 05/04/20 with dizziness, lightheadedness and generalized weakness.  She reported low BP at home with systolic in 80'H, and EMS reported BP on their arrival was 83/69 which responded well to 500 cc fluid bolus.     In the ED, afebrile, She was initially bradycardic with HR of 38 as well.  BP improved in the ED where she was later hypertensive 166/71.  Labs notable for troponin 52 >> 220, BNP 178.4, TSH mildly elevated 5.939, hypomagnesemia 1.6, UA negative for infection.  Covid-19 PCR negative.   Chest xray showed small nodular opacities are present within the right mid/upper lung field and left mid lung field that are chronic (seen on films back in 2019), no superimposed airspace consolidations or pulmonary edema.

## 2020-05-05 NOTE — Evaluation (Signed)
Occupational Therapy Evaluation Patient Details Name: Jadyn Brasher MRN: [id number] DOB: 1930-04-26 Today's Date: 05/05/2020    History of Present Illness  86 y.o. female with medical history significant of hypertension, hyperlipidemia, hypothyroidism, breast cancer (s/p of left lumpectomy), bronchiectasis, who presents with dizziness and general weakness.  On arrival she had very low HR and BP, feeling back to normal at time of exam.   Clinical Impression   Pt was seen for OT evaluation this date. Prior to hospital admission, pt was independent, caregiver for husband. Currently pt demonstrates baseline independence during ADL and ADL mobility tasks. Denies dizziness throughout and denies additional needs. Pt educated briefly in falls prevention strategies and caregiving strategies to support her return to caregiver role for husband. No additional skilled OT needs at this time. Will sign off.      Follow Up Recommendations  No OT follow up    Equipment Recommendations  None recommended by OT    Recommendations for Other Services       Precautions / Restrictions Precautions Precautions: Fall Restrictions Weight Bearing Restrictions: No      Mobility Bed Mobility Overal bed mobility: Independent             General bed mobility comments: deferred, up in recliner  Transfers Overall transfer level: Independent Equipment used: None             General transfer comment: Rises safely and confidently w/o assist or AD    Balance Overall balance assessment: Independent                                         ADL either performed or assessed with clinical judgement   ADL Overall ADL's : At baseline;Independent                                             Vision Patient Visual Report: No change from baseline       Perception     Praxis      Pertinent Vitals/Pain Pain Assessment: No/denies pain     Hand Dominance Right    Extremity/Trunk Assessment Upper Extremity Assessment Upper Extremity Assessment: Overall WFL for tasks assessed   Lower Extremity Assessment Lower Extremity Assessment: Overall WFL for tasks assessed   Cervical / Trunk Assessment Cervical / Trunk Assessment: Normal   Communication Communication Communication: No difficulties   Cognition Arousal/Alertness: Awake/alert Behavior During Therapy: WFL for tasks assessed/performed Overall Cognitive Status: Within Functional Limits for tasks assessed                                     General Comments  Pt at baseline, very confident with ability to return home.  No issues with any aspect of PT exam.    Exercises Other Exercises Other Exercises: Pt indep with toileting, able to don/doff briefs, place a clean pad in undergarments. Pt educated in falls prevention, strategies for caregiving for husband to improve safety and support   Shoulder Instructions      Home Living Family/patient expects to be discharged to:: Private residence Living Arrangements: Spouse/significant other (husband with dementia, pt is caregiver) Available Help at Discharge: Family Type of Home: House Home Access: Stairs to  enter Entrance Stairs-Number of Steps: a few   Home Layout: Bed/bath upstairs;Two level Alternate Level Stairs-Number of Steps: flight Alternate Level Stairs-Rails:  (yes)           Home Equipment: Walker - 2 wheels          Prior Functioning/Environment Level of Independence: Independent        Comments: Pt able to be active, drives and runs errands, takes husband with her. Caregiver for husband.        OT Problem List: Other (comment) (dizzy upon admission, not dizzy during assessment)      OT Treatment/Interventions:      OT Goals(Current goals can be found in the care plan section) Acute Rehab OT Goals Patient Stated Goal: go home  OT Goal Formulation: All assessment and education complete, DC  therapy  OT Frequency:     Barriers to D/C:            Co-evaluation              AM-PAC OT "6 Clicks" Daily Activity     Outcome Measure Help from another person eating meals?: None Help from another person taking care of personal grooming?: None Help from another person toileting, which includes using toliet, bedpan, or urinal?: None Help from another person bathing (including washing, rinsing, drying)?: None Help from another person to put on and taking off regular upper body clothing?: None Help from another person to put on and taking off regular lower body clothing?: None 6 Click Score: 24   End of Session Nurse Communication: Mobility status  Activity Tolerance: Patient tolerated treatment well Patient left: in chair;with call bell/phone within reach;with chair alarm set  OT Visit Diagnosis: Other abnormalities of gait and mobility (R26.89)                Time: 0034-9179 OT Time Calculation (min): 12 min Charges:  OT General Charges $OT Visit: 1 Visit OT Evaluation $OT Eval Low Complexity: 1 Low  Jeni Salles, MPH, MS, OTR/L ascom [phone] 05/05/20, 12:53 PM

## 2020-05-05 NOTE — Evaluation (Signed)
Physical Therapy Evaluation Patient Details Name: Alexa Pham MRN: [id number] DOB: [birth year] Today's Date: 05/05/2020   History of Present Illness   86 y.o. female with medical history significant of hypertension, hyperlipidemia, hypothyroidism, breast cancer (s/p of left lumpectomy), bronchiectasis, who presents with dizziness and general weakness.  On arrival she had very low HR and BP, feeling back to normal at time of exam.  Clinical Impression  Pt did very well with PT and ultimately is at her baseline and was able to easily do all mobility, prolonged ambulation, stairs and was confident with all tasks.  She had stable HR and no fatigue or safety issues with activity.  No needs, signing off.    Follow Up Recommendations No PT follow up    Equipment Recommendations  None recommended by PT    Recommendations for Other Services       Precautions / Restrictions Precautions Precautions: Fall Restrictions Weight Bearing Restrictions: No      Mobility  Bed Mobility Overal bed mobility: Independent                Transfers Overall transfer level: Independent Equipment used: None             General transfer comment: Rises safely and confidently w/o assist or AD  Ambulation/Gait Ambulation/Gait assistance: Independent Gait Distance (Feet): 250 Feet Assistive device: None       General Gait Details: Pt able to confidently and safety circumambulate the nurses' station with community appropriate speed and no safety/balance issues.   Stairs Stairs: Yes Stairs assistance: Modified independent (Device/Increase time) Stair Management: One rail Left Number of Stairs: 5 General stair comments: limited number of steps 2/2 IV line, but pt able to negotiate w/o issue   Wheelchair Mobility    Modified Rankin (Stroke Patients Only)       Balance Overall balance assessment: Independent                                           Pertinent  Vitals/Pain Pain Assessment: No/denies pain    Home Living Family/patient expects to be discharged to:: Private residence Living Arrangements: Spouse/significant other (husband with dementia, pt is caregiver) Available Help at Discharge: Family Type of Home: House Home Access: Stairs to enter   [employer] of Steps: a few Home Layout: Bed/bath upstairs;Two level Home Equipment: Walker - 2 wheels      Prior Function Level of Independence: Independent         Comments: Pt able to be active, drives and runs errands, takes husband with her     Hand Dominance        Extremity/Trunk Assessment   Upper Extremity Assessment Upper Extremity Assessment: Overall WFL for tasks assessed    Lower Extremity Assessment Lower Extremity Assessment: Overall WFL for tasks assessed       Communication   Communication: No difficulties  Cognition Arousal/Alertness: Awake/alert Behavior During Therapy: WFL for tasks assessed/performed Overall Cognitive Status: Within Functional Limits for tasks assessed                                        General Comments General comments (skin integrity, edema, etc.): Pt at baseline, very confident with ability to return home.  No issues with any aspect of PT exam.  Exercises     Assessment/Plan    PT Assessment Patent does not need any further PT services  PT Problem List         PT Treatment Interventions      PT Goals (Current goals can be found in the Care Plan section)  Acute Rehab PT Goals Patient Stated Goal: go home  PT Goal Formulation: All assessment and education complete, DC therapy    Frequency     Barriers to discharge        Co-evaluation               AM-PAC PT "6 Clicks" Mobility  Outcome Measure Help needed turning from your back to your side while in a flat bed without using bedrails?: None Help needed moving from lying on your back to sitting on the side of a flat bed  without using bedrails?: None Help needed moving to and from a bed to a chair (including a wheelchair)?: None Help needed standing up from a chair using your arms (e.g., wheelchair or bedside chair)?: None Help needed to walk in hospital room?: None Help needed climbing 3-5 steps with a railing? : None 6 Click Score: 24    End of Session Equipment Utilized During Treatment: Gait belt Activity Tolerance: Patient tolerated treatment well Patient left: with chair alarm set;with call bell/phone within reach   PT Visit Diagnosis: Difficulty in walking, not elsewhere classified (R26.2);Muscle weakness (generalized) (M62.81)    Time: 1100-1117 PT Time Calculation (min) (ACUTE ONLY): 17 min   Charges:   PT Evaluation $PT Eval Low Complexity: 1 Low          Kreg Shropshire, DPT 05/05/2020, 12:36 PM

## 2020-05-05 NOTE — Progress Notes (Signed)
OT Cancellation Note  Patient Details Name: Alexa Pham MRN: [id number] DOB: 10-12-1929   Cancelled Treatment:    Reason Eval/Treat Not Completed: Other (comment). Consult received, chart reviewed. Pt noted with up trending troponin. Cardiac consult pending. Will hold OT evaluation at this time and re-attempt pending cardiology input.   Jeni Salles, MPH, MS, OTR/L ascom [phone] 05/05/20, 8:26 AM

## 2020-05-06 ENCOUNTER — Telehealth: Payer: Self-pay

## 2020-05-06 NOTE — Telephone Encounter (Signed)
Unable to connect with patient for transition of care. No answer. Unable to leave a message. Will follow as appropriate.

## 2020-05-09 NOTE — Telephone Encounter (Signed)
Reached patient for transition of care. Patient states she is doing okay, at her baseline and declines hospital follow up at this time. Notes she plans to keep upcoming appointment in September and will call the office if needed sooner.

## 2020-06-08 ENCOUNTER — Other Ambulatory Visit: Payer: Self-pay

## 2020-06-08 ENCOUNTER — Other Ambulatory Visit (INDEPENDENT_AMBULATORY_CARE_PROVIDER_SITE_OTHER): Payer: Medicare Other

## 2020-06-08 DIAGNOSIS — E039 Hypothyroidism, unspecified: Secondary | ICD-10-CM | POA: Diagnosis not present

## 2020-06-08 DIAGNOSIS — I10 Essential (primary) hypertension: Secondary | ICD-10-CM

## 2020-06-08 DIAGNOSIS — E7800 Pure hypercholesterolemia, unspecified: Secondary | ICD-10-CM | POA: Diagnosis not present

## 2020-06-08 LAB — COMPREHENSIVE METABOLIC PANEL
ALT: 27 U/L (ref 0–35)
AST: 42 U/L — ABNORMAL HIGH (ref 0–37)
Albumin: 4 g/dL (ref 3.5–5.2)
Alkaline Phosphatase: 69 U/L (ref 39–117)
BUN: 18 mg/dL (ref 6–23)
CO2: 33 mEq/L — ABNORMAL HIGH (ref 19–32)
Calcium: 9.5 mg/dL (ref 8.4–10.5)
Chloride: 100 mEq/L (ref 96–112)
Creatinine, Ser: 0.83 mg/dL (ref 0.40–1.20)
GFR: 64.78 mL/min (ref >60.00)
Glucose, Bld: 92 mg/dL (ref 70–99)
Potassium: 3.7 mEq/L (ref 3.5–5.1)
Sodium: 140 mEq/L (ref 135–145)
Total Bilirubin: 0.8 mg/dL (ref 0.2–1.2)
Total Protein: 6.8 g/dL (ref 6.0–8.3)

## 2020-06-08 LAB — LIPID PANEL
Cholesterol: 127 mg/dL (ref 0–200)
HDL: 61.7 mg/dL (ref >39.00)
LDL Cholesterol: 53 mg/dL (ref 0–99)
NonHDL: 64.9
Total CHOL/HDL Ratio: 2
Triglycerides: 60 mg/dL (ref 0.0–149.0)
VLDL: 12 mg/dL (ref 0.0–40.0)

## 2020-06-08 LAB — TSH: TSH: 12.23 u[IU]/mL — ABNORMAL HIGH (ref 0.35–4.50)

## 2020-06-10 ENCOUNTER — Encounter: Payer: Self-pay | Admitting: Internal Medicine

## 2020-06-10 ENCOUNTER — Other Ambulatory Visit: Payer: Self-pay

## 2020-06-10 ENCOUNTER — Ambulatory Visit (INDEPENDENT_AMBULATORY_CARE_PROVIDER_SITE_OTHER): Payer: Medicare Other | Admitting: Internal Medicine

## 2020-06-10 DIAGNOSIS — I10 Essential (primary) hypertension: Secondary | ICD-10-CM | POA: Diagnosis not present

## 2020-06-10 DIAGNOSIS — J479 Bronchiectasis, uncomplicated: Secondary | ICD-10-CM

## 2020-06-10 DIAGNOSIS — E7800 Pure hypercholesterolemia, unspecified: Secondary | ICD-10-CM

## 2020-06-10 DIAGNOSIS — F439 Reaction to severe stress, unspecified: Secondary | ICD-10-CM | POA: Diagnosis not present

## 2020-06-10 DIAGNOSIS — R7989 Other specified abnormal findings of blood chemistry: Secondary | ICD-10-CM

## 2020-06-10 DIAGNOSIS — Z23 Encounter for immunization: Secondary | ICD-10-CM

## 2020-06-10 DIAGNOSIS — I700 Atherosclerosis of aorta: Secondary | ICD-10-CM

## 2020-06-10 DIAGNOSIS — R945 Abnormal results of liver function studies: Secondary | ICD-10-CM

## 2020-06-10 DIAGNOSIS — R42 Dizziness and giddiness: Secondary | ICD-10-CM

## 2020-06-10 DIAGNOSIS — E039 Hypothyroidism, unspecified: Secondary | ICD-10-CM | POA: Diagnosis not present

## 2020-06-10 MED ORDER — LEVOTHYROXINE SODIUM 137 MCG PO TABS
137.0000 ug | ORAL_TABLET | Freq: Every day | ORAL | 2 refills | Status: DC
Start: 2020-06-10 — End: 2020-08-08

## 2020-06-10 NOTE — Progress Notes (Addendum)
[name], female   DOB: 1930/08/28, 86 y.o.   MRN: [id number]   Subjective:    [name], female    DOB: [birth year], 86 y.o.   MRN: [id number]  HPI This visit occurred during the SARS-CoV-2 public health emergency.  Safety protocols were in place, including screening questions prior to the visit, additional usage of staff PPE, and extensive cleaning of exam room while observing appropriate contact time as indicated for disinfecting solutions.  Patient here for a scheduled follow up. Was admitted 05/04/20 with dizziness, lightheadedness and weakness - with associated hypotension.  CXR - chronic small nodular opacities.  Given IVFs.  Feeling better.  Dizziness felt most likely to be due to hypotension and bradycardia in setting of mild dehydration.  Blood pressure responded quickly to IVFs and dizziness resolved.  Blood pressure medication adjusted while in hospital.  Added back on discharge. Since her discharge, she has been doing relatively well.  Tries to stay active.  No chest pain or sob reported.  No abdominal pain.  Bowels moving.  Discussed labs.  TSH - elevated.  Discussed adjusting synthroid dose.     Past Medical History:  Diagnosis Date  . History of breast cancer   . History of pneumonia   . Hypertension   . Hypothyroidism   . Macular degeneration    Past Surgical History:  Procedure Laterality Date  . ABDOMINAL HYSTERECTOMY    . ABDOMINAL HYSTERECTOMY    . APPENDECTOMY    . BREAST LUMPECTOMY    . CATARACT EXTRACTION, BILATERAL    . CHOLECYSTECTOMY    . REPLACEMENT TOTAL KNEE    . TONSILLECTOMY AND ADENOIDECTOMY     Family History  Problem Relation Age of Onset  . CAD Mother    Social History   Socioeconomic History  . Marital status: Married    Spouse name: Not on file  . Number of children: Not on file  . Years of education: Not on file  . Highest education level: Not on file  Occupational History  . Not on file  Tobacco Use  .  Smoking status: Former Smoker    Packs/day: 1.50    Years: 6.00    Pack years: 9.00    Types: Cigarettes    Quit date: 1975    Years since quitting: 46.7  . Smokeless tobacco: Never Used  Substance and Sexual Activity  . Alcohol use: No    Alcohol/week: 0.0 standard drinks  . Drug use: No  . Sexual activity: Not on file  Other Topics Concern  . Not on file  Social History Narrative   Married    Social Determinants of Health   Financial Resource Strain: Low Risk   . Difficulty of Paying Living Expenses: Not hard at all  Food Insecurity: No Food Insecurity  . Worried About Charity fundraiser in the Last Year: Never true  . Ran Out of Food in the Last Year: Never true  Transportation Needs: No Transportation Needs  . Lack of Transportation (Medical): No  . Lack of Transportation (Non-Medical): No  Physical Activity: Unknown  . Days of Exercise per Week: 0 days  . Minutes of Exercise per Session: Not on file  Stress: No Stress Concern Present  . Feeling of Stress : Not at all  Social Connections:   . Frequency of Communication with Friends and Family: Not on file  . Frequency of Social Gatherings with Friends and Family: Not on file  . Attends  Religious Services: Not on file  . Active Member of Clubs or Organizations: Not on file  . Attends Archivist Meetings: Not on file  . Marital Status: Not on file    Outpatient Encounter Medications as of 06/10/2020  Medication Sig  . amLODipine (NORVASC) 5 MG tablet Take 1 tablet (5 mg total) by mouth daily.  Marland Kitchen aspirin EC 81 MG EC tablet Take 1 tablet (81 mg total) by mouth daily. Swallow whole.  Marland Kitchen atorvastatin (LIPITOR) 40 MG tablet Take 1 tablet (40 mg total) by mouth daily.  . calcium citrate-vitamin D (CITRACAL+D) 315-200 MG-UNIT tablet Take 1 tablet by mouth 2 (two) times daily.  . irbesartan (AVAPRO) 75 MG tablet Take 1 tablet (75 mg total) by mouth daily.  Marland Kitchen levothyroxine (SYNTHROID) 137 MCG tablet Take 1 tablet  (137 mcg total) by mouth daily before breakfast.  . metoprolol succinate (TOPROL-XL) 50 MG 24 hr tablet TAKE 1 TABLET BY MOUTH EVERY DAY WITH OR IMMEDIATELY FOLLOWING A MEAL  . Multiple Vitamin (MULTIVITAMIN) capsule Take 1 capsule by mouth daily.  . Multiple Vitamins-Minerals (PRESERVISION AREDS 2 PO) Take by mouth.  . [DISCONTINUED] levothyroxine (SYNTHROID) 125 MCG tablet TAKE 1 TABLET BY MOUTH EVERY DAY BEFORE BREAKFAST   No facility-administered encounter medications on file as of 06/10/2020.    Review of Systems     Objective:    Physical Exam  BP 126/70   Pulse 66   Temp 97.6 F (36.4 C) (Oral)   Resp 16   Ht 5\' 6"  (1.676 m)   Wt 115 lb 6.4 oz (52.3 kg)   SpO2 99%   BMI 18.63 kg/m  Wt Readings from Last 3 Encounters:  06/10/20 115 lb 6.4 oz (52.3 kg)  05/04/20 114 lb 9.6 oz (52 kg)  03/10/20 113 lb 6.4 oz (51.4 kg)     Lab Results  Component Value Date   WBC 5.5 05/05/2020   HGB 12.9 05/05/2020   HCT 37.9 05/05/2020   PLT 209 05/05/2020   GLUCOSE 92 06/08/2020   CHOL 127 06/08/2020   TRIG 60.0 06/08/2020   HDL 61.70 06/08/2020   LDLCALC 53 06/08/2020   ALT 27 06/08/2020   AST 42 (H) 06/08/2020   NA 140 06/08/2020   K 3.7 06/08/2020   CL 100 06/08/2020   CREATININE 0.83 06/08/2020   BUN 18 06/08/2020   CO2 33 (H) 06/08/2020   TSH 12.23 (H) 06/08/2020   HGBA1C 5.5 05/05/2020    DG Chest 2 View  Result Date: 05/04/2020 CLINICAL DATA:  Dizzy. Additional provided: Dizziness, low blood pressure, weakness, history of hypertension and breast cancer. EXAM: CHEST - 2 VIEW COMPARISON:  Chest CT 03/10/2019, chest CT 10/09/2018, chest radiographs 09/26/2018 FINDINGS: Heart size within normal limits. Aortic atherosclerosis. There are small nodular opacities within the right mid to upper lung field and left mid lung field which were present on prior examinations dating back to the chest x-ray of 09/26/2018. This may reflect sequela of chronic atypical infection. Please  refer to CT chest 10/09/2018 for further description. No superimposed airspace consolidation or pulmonary edema. No evidence of pleural effusion or pneumothorax. No acute bony abnormality identified. Prominent calcification of the costal cartilage. Surgical clips within the left axilla and upper abdomen. IMPRESSION: Small nodular opacities are present within the right mid/upper lung field and left mid lung field. These findings were present on prior examinations dating back to the chest x-ray of 09/26/2018, and may reflect sequela of chronic atypical infection. Please refer  to CT chest 10/09/2018 for further description. No superimposed airspace consolidation or pulmonary edema. Aortic Atherosclerosis (ICD10-I70.0). Electronically Signed   By: Kellie Simmering DO   On: 05/04/2020 14:06       Assessment & Plan:   Problem List Items Addressed This Visit    Stress    Overall she feels she is handling things relatively well.  Follow.  Does not feel needs any further intervention at this time.        Hypothyroidism    TSH just checked - 12.  Increase synthroid to 174mcg q day.  Follow tsh.  Recheck in 6 weeks.        Relevant Medications   levothyroxine (SYNTHROID) 137 MCG tablet   Hypertension    Blood pressure as outlined.  Continue amlodipine, avapro and metoprolol.  Follow pressures.  Follow metabolic panel.       Hypercholesterolemia    Gas declined cholesterol medication.  Follow lipid panel and liver function tests.        Dizziness    Resolved after hydration in ER      Bronchiectasis without complication (HCC)    Breathing stable.       Aortic atherosclerosis (HCC)    Declines statin medication.  Follow.       Abnormal liver function test    Slight increased liver test.  Recheck liver panel with next labs.       Relevant Orders   Hepatic function panel    Other Visit Diagnoses    Need for immunization against influenza       Relevant Orders   Flu Vaccine QUAD High  Dose(Fluad) (Completed)       Einar Pheasant, MD

## 2020-06-15 ENCOUNTER — Encounter: Payer: Self-pay | Admitting: Internal Medicine

## 2020-06-15 DIAGNOSIS — R7989 Other specified abnormal findings of blood chemistry: Secondary | ICD-10-CM | POA: Insufficient documentation

## 2020-06-15 NOTE — Assessment & Plan Note (Signed)
Gas declined cholesterol medication.  Follow lipid panel and liver function tests.

## 2020-06-15 NOTE — Assessment & Plan Note (Signed)
Resolved after hydration in ER

## 2020-06-15 NOTE — Assessment & Plan Note (Signed)
Declines statin medication.  Follow.

## 2020-06-15 NOTE — Assessment & Plan Note (Signed)
TSH just checked - 12.  Increase synthroid to 169mcg q day.  Follow tsh.  Recheck in 6 weeks.

## 2020-06-15 NOTE — Assessment & Plan Note (Signed)
Blood pressure as outlined.  Continue amlodipine, avapro and metoprolol.  Follow pressures.  Follow metabolic panel.

## 2020-06-15 NOTE — Assessment & Plan Note (Signed)
Breathing stable.

## 2020-06-15 NOTE — Assessment & Plan Note (Signed)
Slight increased liver test.  Recheck liver panel with next labs.

## 2020-06-15 NOTE — Addendum Note (Signed)
Addended by: Alisa Graff on: 06/15/2020 08:59 PM   Modules accepted: Orders

## 2020-06-15 NOTE — Assessment & Plan Note (Signed)
Overall she feels she is handling things relatively well.  Follow.  Does not feel needs any further intervention at this time.

## 2020-06-27 ENCOUNTER — Other Ambulatory Visit (INDEPENDENT_AMBULATORY_CARE_PROVIDER_SITE_OTHER): Payer: Medicare Other

## 2020-06-27 ENCOUNTER — Other Ambulatory Visit: Payer: Self-pay

## 2020-06-27 DIAGNOSIS — R945 Abnormal results of liver function studies: Secondary | ICD-10-CM

## 2020-06-27 DIAGNOSIS — R7989 Other specified abnormal findings of blood chemistry: Secondary | ICD-10-CM

## 2020-06-27 LAB — HEPATIC FUNCTION PANEL
ALT: 29 U/L (ref 0–35)
AST: 41 U/L — ABNORMAL HIGH (ref 0–37)
Albumin: 4.1 g/dL (ref 3.5–5.2)
Alkaline Phosphatase: 71 U/L (ref 39–117)
Bilirubin, Direct: 0.2 mg/dL (ref 0.0–0.3)
Total Bilirubin: 0.8 mg/dL (ref 0.2–1.2)
Total Protein: 6.6 g/dL (ref 6.0–8.3)

## 2020-07-04 DIAGNOSIS — I209 Angina pectoris, unspecified: Secondary | ICD-10-CM | POA: Diagnosis present

## 2020-07-06 ENCOUNTER — Telehealth: Payer: Self-pay | Admitting: Internal Medicine

## 2020-07-06 ENCOUNTER — Other Ambulatory Visit: Payer: Self-pay

## 2020-07-06 ENCOUNTER — Other Ambulatory Visit: Payer: Self-pay | Admitting: Internal Medicine

## 2020-07-06 DIAGNOSIS — R7989 Other specified abnormal findings of blood chemistry: Secondary | ICD-10-CM

## 2020-07-06 MED ORDER — ATORVASTATIN CALCIUM 40 MG PO TABS: 40.0000 mg | ORAL_TABLET | Freq: Every day | ORAL | 1 refills | Status: DC

## 2020-07-06 NOTE — Progress Notes (Signed)
Order placed for abdominal ultrasound.   

## 2020-07-06 NOTE — Telephone Encounter (Signed)
I will refill her atorvastatin and I have advised that it takes about a week or so for everything to process. I wasn't sure if I routed the result note back to you or not so I'm forwarding you this message.

## 2020-07-06 NOTE — Telephone Encounter (Signed)
Pt said she hasn't heard about her ultrasound Dr. Nicki Reaper wanted her to have. She also needs a refill on atorvastatin sent to [facility].

## 2020-07-06 NOTE — Telephone Encounter (Signed)
Noted  

## 2020-07-06 NOTE — Telephone Encounter (Signed)
Patient aware and scheduled.

## 2020-07-06 NOTE — Telephone Encounter (Signed)
Order placed for abdominal ultrasound.   

## 2020-07-12 ENCOUNTER — Telehealth: Payer: Self-pay

## 2020-07-12 ENCOUNTER — Other Ambulatory Visit: Payer: Self-pay

## 2020-07-12 ENCOUNTER — Ambulatory Visit
Admission: RE | Admit: 2020-07-12 | Discharge: 2020-07-12 | Disposition: A | Discharge status: Home / Self Care | Payer: Medicare Other | Source: Ambulatory Visit | Attending: Internal Medicine | Admitting: Internal Medicine

## 2020-07-12 DIAGNOSIS — R945 Abnormal results of liver function studies: Secondary | ICD-10-CM | POA: Insufficient documentation

## 2020-07-12 DIAGNOSIS — R7989 Other specified abnormal findings of blood chemistry: Secondary | ICD-10-CM

## 2020-07-12 NOTE — Telephone Encounter (Signed)
Alexa Pham from [facility] [facility] called to give abnormal U/S results. Please see results in Epic. Patient is not aware of results at this time.

## 2020-07-13 NOTE — Telephone Encounter (Signed)
Notify pt that her ultrasound reveals what appears to be a mass - left kidney - need to get a better look at this area.  Would like to schedule her for CT or MRI to better evaluate.  Is she agreeable to schedule further testing. If so, let me know.  Confirm no contraindication to MRI - Metal , etc.

## 2020-07-13 NOTE — Telephone Encounter (Signed)
Patient is aware and agreeable to schedule further testing. As far as she knows, no contraindication to MRI. She thinks she has had one in the past but it has been awhile.

## 2020-07-14 ENCOUNTER — Other Ambulatory Visit: Payer: Self-pay | Admitting: Internal Medicine

## 2020-07-14 DIAGNOSIS — N2889 Other specified disorders of kidney and ureter: Secondary | ICD-10-CM

## 2020-07-14 NOTE — Telephone Encounter (Signed)
Order placed for CT (abdomen/renal).

## 2020-07-14 NOTE — Progress Notes (Signed)
Order placed for CT abdomen/renal.

## 2020-07-19 ENCOUNTER — Ambulatory Visit
Admission: RE | Admit: 2020-07-19 | Discharge: 2020-07-19 | Disposition: A | Discharge status: Home / Self Care | Payer: Medicare Other | Source: Ambulatory Visit | Attending: Internal Medicine | Admitting: Internal Medicine

## 2020-07-19 ENCOUNTER — Other Ambulatory Visit: Payer: Self-pay

## 2020-07-19 DIAGNOSIS — N2889 Other specified disorders of kidney and ureter: Secondary | ICD-10-CM | POA: Diagnosis not present

## 2020-07-19 LAB — POCT I-STAT CREATININE: Creatinine, Ser: 0.9 mg/dL (ref 0.44–1.00)

## 2020-07-19 MED ORDER — IOHEXOL 300 MG/ML  SOLN
100.0000 mL | Freq: Once | INTRAMUSCULAR | Status: AC | PRN
  Administered 2020-07-19: 100 mL via INTRAVENOUS

## 2020-07-21 ENCOUNTER — Other Ambulatory Visit: Payer: Self-pay

## 2020-07-21 ENCOUNTER — Telehealth: Payer: Self-pay | Admitting: Internal Medicine

## 2020-07-21 ENCOUNTER — Ambulatory Visit (INDEPENDENT_AMBULATORY_CARE_PROVIDER_SITE_OTHER): Payer: Medicare Other | Admitting: Dermatology

## 2020-07-21 ENCOUNTER — Encounter: Payer: Self-pay | Admitting: Dermatology

## 2020-07-21 DIAGNOSIS — Z85828 Personal history of other malignant neoplasm of skin: Secondary | ICD-10-CM | POA: Diagnosis not present

## 2020-07-21 DIAGNOSIS — L821 Other seborrheic keratosis: Secondary | ICD-10-CM

## 2020-07-21 DIAGNOSIS — L570 Actinic keratosis: Secondary | ICD-10-CM | POA: Diagnosis not present

## 2020-07-21 DIAGNOSIS — D485 Neoplasm of uncertain behavior of skin: Secondary | ICD-10-CM

## 2020-07-21 DIAGNOSIS — L820 Inflamed seborrheic keratosis: Secondary | ICD-10-CM | POA: Diagnosis not present

## 2020-07-21 DIAGNOSIS — D492 Neoplasm of unspecified behavior of bone, soft tissue, and skin: Secondary | ICD-10-CM

## 2020-07-21 DIAGNOSIS — L578 Other skin changes due to chronic exposure to nonionizing radiation: Secondary | ICD-10-CM

## 2020-07-21 NOTE — Progress Notes (Signed)
Follow-Up Visit   Subjective  Alexa Pham is a 86 y.o. female who presents for the following: Follow-up (Biopsy proven hypertrophic  Ak R thumb prox nail fold 01-28-2020, pt here for treattment ) and Skin Cancer (check hx of BCC left midline mid dorsum nose treated by Mohs Dr Lacinda Axon ).  The following portions of the chart were reviewed this encounter and updated as appropriate:  Tobacco  Allergies  Meds  Problems  Med Hx  Surg Hx  Fam Hx     Review of Systems:  No other skin or systemic complaints except as noted in HPI or Assessment and Plan.  Objective  Well appearing patient in no apparent distress; mood and affect are within normal limits.  A focused examination was performed including face, arms, hands, neck . Relevant physical exam findings are noted in the Assessment and Plan.  Objective  L ear x 1, L temple x 3, R cheek, R thumb prox nail fold x 1, nose x 2 (8): Erythematous thin papules/macules with gritty scale.   Objective  R neck x 1: Erythematous keratotic or waxy stuck-on papule or plaque.   Objective  Nose: Well healed scar with no evidence of recurrence.   Objective  Left dorsum hand: 1.2 cm  Pearly papule         Assessment & Plan  AK (actinic keratosis) (8) L ear x 1, L temple x 3, R cheek, R thumb prox nail fold x 1, nose x 2  Biopsy proven Hypertrophic Ak R thumb prox nail fold 01-28-2020 treated today with Ln2   Destruction of lesion - L ear x 1, L temple x 3, R cheek, R thumb prox nail fold x 1, nose x 2 Complexity: simple   Destruction method: cryotherapy   Informed consent: discussed and consent obtained   Timeout:  patient name, date of birth, surgical site, and procedure verified Lesion destroyed using liquid nitrogen: Yes   Region frozen until ice ball extended beyond lesion: Yes   Outcome: patient tolerated procedure well with no complications   Post-procedure details: wound care instructions given    Inflamed seborrheic  keratosis R neck x 1  Destruction of lesion - R neck x 1 Complexity: simple   Destruction method: cryotherapy   Informed consent: discussed and consent obtained   Timeout:  patient name, date of birth, surgical site, and procedure verified Lesion destroyed using liquid nitrogen: Yes   Region frozen until ice ball extended beyond lesion: Yes   Outcome: patient tolerated procedure well with no complications   Post-procedure details: wound care instructions given    History of basal cell carcinoma (BCC) Nose  Clear. Observe for recurrence. Call clinic for new or changing lesions.  Recommend regular skin exams, daily broad-spectrum spf 30+ sunscreen use, and photoprotection.     Neoplasm of skin Left dorsum hand  Skin / nail biopsy Type of biopsy: tangential   Informed consent: discussed and consent obtained   Patient was prepped and draped in usual sterile fashion: area prepped with alochol. Anesthesia: the lesion was anesthetized in a standard fashion   Anesthetic:  1% lidocaine w/ epinephrine 1-100,000 buffered w/ 8.4% NaHCO3 Instrument used: flexible razor blade   Hemostasis achieved with: pressure, aluminum chloride and electrodesiccation   Outcome: patient tolerated procedure well   Post-procedure details: wound care instructions given   Post-procedure details comment:  Ointment and small bandage  Specimen 1 - Surgical pathology Differential Diagnosis: R/O BCC  Check Margins: No 1.2 cm  Pearly papule   Actinic Damage - diffuse scaly erythematous macules with underlying dyspigmentation - Recommend daily broad spectrum sunscreen SPF 30+ to sun-exposed areas, reapply every 2 hours as needed.  - Call for new or changing lesions.  Seborrheic Keratoses - Stuck-on, waxy, tan-brown papules and plaques  - Discussed benign etiology and prognosis. - Observe - Call for any changes  Return in about 3 months (around 10/21/2020).  IMarye Round, CMA, am acting as scribe for  Sarina Ser, MD .  Documentation: I have reviewed the above documentation for accuracy and completeness, and I agree with the above.  Sarina Ser, MD

## 2020-07-21 NOTE — Patient Instructions (Signed)

## 2020-07-21 NOTE — Telephone Encounter (Signed)
Patient called in for results from CT scan

## 2020-07-21 NOTE — Telephone Encounter (Signed)
FYI

## 2020-07-22 ENCOUNTER — Other Ambulatory Visit
Admission: RE | Admit: 2020-07-22 | Discharge: 2020-07-22 | Disposition: A | Discharge status: Home / Self Care | Payer: Medicare Other | Source: Ambulatory Visit | Attending: Internal Medicine | Admitting: Internal Medicine

## 2020-07-22 ENCOUNTER — Encounter: Payer: Self-pay | Admitting: Dermatology

## 2020-07-22 DIAGNOSIS — Z01812 Encounter for preprocedural laboratory examination: Secondary | ICD-10-CM | POA: Diagnosis present

## 2020-07-22 DIAGNOSIS — Z20822 Contact with and (suspected) exposure to covid-19: Secondary | ICD-10-CM | POA: Diagnosis not present

## 2020-07-22 LAB — SARS CORONAVIRUS 2 (TAT 6-24 HRS): SARS Coronavirus 2: NEGATIVE

## 2020-07-22 NOTE — Telephone Encounter (Signed)
Pt said she is concerned and she wants to know what her results from CT scan is. She wants a call back today please.

## 2020-07-23 ENCOUNTER — Other Ambulatory Visit: Payer: Self-pay | Admitting: Internal Medicine

## 2020-07-23 DIAGNOSIS — N2889 Other specified disorders of kidney and ureter: Secondary | ICD-10-CM

## 2020-07-23 NOTE — Progress Notes (Signed)
Order placed for urology referral.  

## 2020-07-23 NOTE — Telephone Encounter (Signed)
Discussed results with pt 07/22/20.  Agreeable to urology referral.

## 2020-07-26 ENCOUNTER — Encounter: Payer: Self-pay | Admitting: Internal Medicine

## 2020-07-26 ENCOUNTER — Other Ambulatory Visit: Payer: Self-pay

## 2020-07-26 ENCOUNTER — Observation Stay
Admission: RE | Admit: 2020-07-26 | Discharge: 2020-07-27 | Disposition: A | Discharge status: Home / Self Care | Payer: Medicare Other | Attending: Cardiology | Admitting: Cardiology

## 2020-07-26 ENCOUNTER — Encounter
Admission: RE | Disposition: A | Discharge status: Home / Self Care | Payer: Self-pay | Source: Home / Self Care | Attending: Cardiology

## 2020-07-26 DIAGNOSIS — I209 Angina pectoris, unspecified: Secondary | ICD-10-CM | POA: Diagnosis present

## 2020-07-26 DIAGNOSIS — E7800 Pure hypercholesterolemia, unspecified: Secondary | ICD-10-CM | POA: Diagnosis not present

## 2020-07-26 DIAGNOSIS — I25119 Atherosclerotic heart disease of native coronary artery with unspecified angina pectoris: Principal | ICD-10-CM | POA: Insufficient documentation

## 2020-07-26 DIAGNOSIS — I10 Essential (primary) hypertension: Secondary | ICD-10-CM | POA: Diagnosis not present

## 2020-07-26 DIAGNOSIS — R9439 Abnormal result of other cardiovascular function study: Secondary | ICD-10-CM

## 2020-07-26 DIAGNOSIS — I2510 Atherosclerotic heart disease of native coronary artery without angina pectoris: Secondary | ICD-10-CM | POA: Diagnosis present

## 2020-07-26 HISTORY — PX: LEFT HEART CATH AND CORONARY ANGIOGRAPHY: CATH118249

## 2020-07-26 HISTORY — PX: CORONARY STENT INTERVENTION: CATH118234

## 2020-07-26 LAB — BASIC METABOLIC PANEL
Anion gap: 6 (ref 5–15)
BUN: 18 mg/dL (ref 8–23)
CO2: 34 mmol/L — ABNORMAL HIGH (ref 22–32)
Calcium: 8.9 mg/dL (ref 8.9–10.3)
Chloride: 101 mmol/L (ref 98–111)
Creatinine, Ser: 0.83 mg/dL (ref 0.44–1.00)
GFR, Estimated: >60 mL/min (ref >60)
Glucose, Bld: 91 mg/dL (ref 70–99)
Potassium: 3.7 mmol/L (ref 3.5–5.1)
Sodium: 141 mmol/L (ref 135–145)

## 2020-07-26 LAB — POCT ACTIVATED CLOTTING TIME: Activated Clotting Time: 334 seconds

## 2020-07-26 SURGERY — LEFT HEART CATH AND CORONARY ANGIOGRAPHY
Anesthesia: Moderate Sedation

## 2020-07-26 MED ORDER — HEPARIN (PORCINE) IN NACL 1000-0.9 UT/500ML-% IV SOLN
INTRAVENOUS | Status: AC
  Filled 2020-07-26: qty 1000

## 2020-07-26 MED ORDER — ACETAMINOPHEN 325 MG PO TABS: 650.0000 mg | ORAL_TABLET | ORAL | Status: DC | PRN

## 2020-07-26 MED ORDER — TICAGRELOR 90 MG PO TABS
ORAL_TABLET | ORAL | Status: AC
  Filled 2020-07-26: qty 2

## 2020-07-26 MED ORDER — ASPIRIN 81 MG PO CHEW
CHEWABLE_TABLET | ORAL | Status: AC
  Filled 2020-07-26: qty 1

## 2020-07-26 MED ORDER — TICAGRELOR 90 MG PO TABS
ORAL_TABLET | ORAL | Status: DC | PRN
  Administered 2020-07-26: 180 mg via ORAL

## 2020-07-26 MED ORDER — TICAGRELOR 90 MG PO TABS
90.0000 mg | ORAL_TABLET | Freq: Two times a day (BID) | ORAL | Status: DC
  Administered 2020-07-27 (×2): 90 mg via ORAL
  Filled 2020-07-26 (×2): qty 1

## 2020-07-26 MED ORDER — LEVOTHYROXINE SODIUM 25 MCG PO TABS
125.0000 ug | ORAL_TABLET | Freq: Every day | ORAL | Status: DC
  Administered 2020-07-27: 125 ug via ORAL
  Filled 2020-07-26 (×2): qty 1

## 2020-07-26 MED ORDER — BIVALIRUDIN TRIFLUOROACETATE 250 MG IV SOLR
INTRAVENOUS | Status: AC
  Filled 2020-07-26: qty 250

## 2020-07-26 MED ORDER — HYDRALAZINE HCL 20 MG/ML IJ SOLN
INTRAMUSCULAR | Status: AC
  Filled 2020-07-26: qty 1

## 2020-07-26 MED ORDER — SODIUM CHLORIDE 0.9 % WEIGHT BASED INFUSION
1.0000 mL/kg/h | INTRAVENOUS | Status: DC
  Administered 2020-07-26: 1.002 mL/kg/h via INTRAVENOUS

## 2020-07-26 MED ORDER — FENTANYL CITRATE (PF) 100 MCG/2ML IJ SOLN
INTRAMUSCULAR | Status: DC | PRN
  Administered 2020-07-26: 12.5 ug via INTRAVENOUS

## 2020-07-26 MED ORDER — FENTANYL CITRATE (PF) 100 MCG/2ML IJ SOLN
INTRAMUSCULAR | Status: DC | PRN
Start: 2020-07-26 — End: 2020-07-26
  Administered 2020-07-26: 12.5 ug via INTRAVENOUS

## 2020-07-26 MED ORDER — ONDANSETRON HCL 4 MG/2ML IJ SOLN
4.0000 mg | Freq: Four times a day (QID) | INTRAMUSCULAR | Status: DC | PRN
  Administered 2020-07-26: 4 mg via INTRAVENOUS

## 2020-07-26 MED ORDER — MIDAZOLAM HCL 2 MG/2ML IJ SOLN
INTRAMUSCULAR | Status: AC
  Filled 2020-07-26: qty 2

## 2020-07-26 MED ORDER — ASPIRIN 81 MG PO CHEW
81.0000 mg | CHEWABLE_TABLET | ORAL | Status: AC
  Administered 2020-07-26: 81 mg via ORAL

## 2020-07-26 MED ORDER — HEPARIN (PORCINE) IN NACL 1000-0.9 UT/500ML-% IV SOLN
INTRAVENOUS | Status: DC | PRN
  Administered 2020-07-26 (×2): 500 mL

## 2020-07-26 MED ORDER — CALCIUM CITRATE 950 (200 CA) MG PO TABS
200.0000 mg | ORAL_TABLET | Freq: Three times a day (TID) | ORAL | Status: DC
  Administered 2020-07-27: 200 mg via ORAL
  Filled 2020-07-26 (×4): qty 1

## 2020-07-26 MED ORDER — AMLODIPINE BESYLATE 10 MG PO TABS
10.0000 mg | ORAL_TABLET | Freq: Every day | ORAL | Status: DC
  Filled 2020-07-26: qty 1

## 2020-07-26 MED ORDER — SODIUM CHLORIDE 0.9% FLUSH: 3.0000 mL | INTRAVENOUS | Status: DC | PRN

## 2020-07-26 MED ORDER — MIDAZOLAM HCL 2 MG/2ML IJ SOLN
INTRAMUSCULAR | Status: DC | PRN
  Administered 2020-07-26: 0.5 mg via INTRAVENOUS

## 2020-07-26 MED ORDER — SODIUM CHLORIDE 0.9 % WEIGHT BASED INFUSION
1.0000 mL/kg/h | INTRAVENOUS | Status: AC
  Administered 2020-07-26: 1 mL/kg/h via INTRAVENOUS

## 2020-07-26 MED ORDER — IOHEXOL 300 MG/ML  SOLN
INTRAMUSCULAR | Status: DC | PRN
  Administered 2020-07-26: 250 mL

## 2020-07-26 MED ORDER — SODIUM CHLORIDE 0.9 % WEIGHT BASED INFUSION: 3.0000 mL/kg/h | INTRAVENOUS | Status: AC

## 2020-07-26 MED ORDER — SODIUM CHLORIDE 0.9 % IV SOLN: 250.0000 mL | INTRAVENOUS | Status: DC | PRN

## 2020-07-26 MED ORDER — IRBESARTAN 150 MG PO TABS
75.0000 mg | ORAL_TABLET | Freq: Every day | ORAL | Status: DC
  Administered 2020-07-27: 75 mg via ORAL
  Filled 2020-07-26 (×2): qty 1

## 2020-07-26 MED ORDER — BIVALIRUDIN BOLUS VIA INFUSION - CUPID
INTRAVENOUS | Status: DC | PRN
  Administered 2020-07-26: 37.425 mg via INTRAVENOUS

## 2020-07-26 MED ORDER — IOHEXOL 300 MG/ML  SOLN
INTRAMUSCULAR | Status: DC | PRN
  Administered 2020-07-26: 75 mL

## 2020-07-26 MED ORDER — ONDANSETRON HCL 4 MG/2ML IJ SOLN
INTRAMUSCULAR | Status: AC
  Filled 2020-07-26: qty 2

## 2020-07-26 MED ORDER — LABETALOL HCL 5 MG/ML IV SOLN: 10.0000 mg | INTRAVENOUS | Status: AC | PRN

## 2020-07-26 MED ORDER — METOPROLOL SUCCINATE ER 50 MG PO TB24
50.0000 mg | ORAL_TABLET | Freq: Every day | ORAL | Status: DC
  Administered 2020-07-27: 50 mg via ORAL
  Filled 2020-07-26: qty 1

## 2020-07-26 MED ORDER — HYDRALAZINE HCL 20 MG/ML IJ SOLN
10.0000 mg | INTRAMUSCULAR | Status: AC | PRN
  Administered 2020-07-26: 10 mg via INTRAVENOUS

## 2020-07-26 MED ORDER — ASPIRIN 81 MG PO CHEW
CHEWABLE_TABLET | ORAL | Status: AC
  Filled 2020-07-26: qty 3

## 2020-07-26 MED ORDER — SODIUM CHLORIDE 0.9 % IV SOLN
INTRAVENOUS | Status: AC | PRN
  Administered 2020-07-26: 1.75 mg/kg/h via INTRAVENOUS

## 2020-07-26 MED ORDER — SODIUM CHLORIDE 0.9% FLUSH
3.0000 mL | Freq: Two times a day (BID) | INTRAVENOUS | Status: DC
  Administered 2020-07-26 – 2020-07-27 (×3): 3 mL via INTRAVENOUS

## 2020-07-26 MED ORDER — ASPIRIN 81 MG PO CHEW
CHEWABLE_TABLET | ORAL | Status: DC | PRN
  Administered 2020-07-26: 243 mg via ORAL

## 2020-07-26 MED ORDER — FENTANYL CITRATE (PF) 100 MCG/2ML IJ SOLN
INTRAMUSCULAR | Status: AC
  Filled 2020-07-26: qty 2

## 2020-07-26 MED ORDER — SODIUM CHLORIDE 0.9% FLUSH
3.0000 mL | Freq: Two times a day (BID) | INTRAVENOUS | Status: DC
  Administered 2020-07-27 (×2): 3 mL via INTRAVENOUS

## 2020-07-26 MED ORDER — ASPIRIN 81 MG PO CHEW
81.0000 mg | CHEWABLE_TABLET | Freq: Every day | ORAL | Status: DC
  Filled 2020-07-26: qty 1

## 2020-07-26 MED ORDER — ATORVASTATIN CALCIUM 20 MG PO TABS
40.0000 mg | ORAL_TABLET | Freq: Every day | ORAL | Status: DC
  Administered 2020-07-27: 40 mg via ORAL
  Filled 2020-07-26 (×2): qty 2

## 2020-07-26 SURGICAL SUPPLY — 18 items
BALLN TREK RX 2.5X12 (BALLOONS) ×4
BALLOON TREK RX 2.5X12 (BALLOONS) ×2 IMPLANT
CATH INFINITI 5FR ANG PIGTAIL (CATHETERS) ×4 IMPLANT
CATH INFINITI 5FR JL4 (CATHETERS) ×4 IMPLANT
CATH INFINITI JR4 5F (CATHETERS) ×4 IMPLANT
CATH VISTA GUIDE 6FR XB3.5 (CATHETERS) ×4 IMPLANT
DEVICE CLOSURE MYNXGRIP 6/7F (Vascular Products) ×4 IMPLANT
KIT ENCORE 26 ADVANTAGE (KITS) ×4 IMPLANT
KIT MANI 3VAL PERCEP (MISCELLANEOUS) ×4 IMPLANT
NEEDLE PERC 18GX7CM (NEEDLE) ×4 IMPLANT
PACK CARDIAC CATH (CUSTOM PROCEDURE TRAY) ×4 IMPLANT
SHEATH AVANTI 5FR X 11CM (SHEATH) ×4 IMPLANT
SHEATH AVANTI 6FR X 11CM (SHEATH) ×4 IMPLANT
STENT RESOLUTE ONYX 3.0X12 (Permanent Stent) ×4 IMPLANT
WIRE ASAHI PROWATER 180CM (WIRE) ×4 IMPLANT
WIRE G HI TQ BMW 190 (WIRE) ×4 IMPLANT
WIRE GUIDERIGHT .035X150 (WIRE) ×4 IMPLANT
WIRE RUNTHROUGH .014X180CM (WIRE) ×4 IMPLANT

## 2020-07-27 ENCOUNTER — Encounter: Payer: Self-pay | Admitting: Internal Medicine

## 2020-07-27 ENCOUNTER — Telehealth: Payer: Self-pay

## 2020-07-27 DIAGNOSIS — I25119 Atherosclerotic heart disease of native coronary artery with unspecified angina pectoris: Secondary | ICD-10-CM | POA: Diagnosis not present

## 2020-07-27 LAB — BASIC METABOLIC PANEL
Anion gap: 8 (ref 5–15)
BUN: 18 mg/dL (ref 8–23)
CO2: 31 mmol/L (ref 22–32)
Calcium: 8 mg/dL — ABNORMAL LOW (ref 8.9–10.3)
Chloride: 100 mmol/L (ref 98–111)
Creatinine, Ser: 0.96 mg/dL (ref 0.44–1.00)
GFR, Estimated: 53 mL/min — ABNORMAL LOW (ref >60)
Glucose, Bld: 114 mg/dL — ABNORMAL HIGH (ref 70–99)
Potassium: 3.5 mmol/L (ref 3.5–5.1)
Sodium: 139 mmol/L (ref 135–145)

## 2020-07-27 LAB — CBC
HCT: 39 % (ref 36.0–46.0)
Hemoglobin: 13 g/dL (ref 12.0–15.0)
MCH: 31.8 pg (ref 26.0–34.0)
MCHC: 33.3 g/dL (ref 30.0–36.0)
MCV: 95.4 fL (ref 80.0–100.0)
Platelets: 228 10*3/uL (ref 150–400)
RBC: 4.09 MIL/uL (ref 3.87–5.11)
RDW: 14.6 % (ref 11.5–15.5)
WBC: 7.8 10*3/uL (ref 4.0–10.5)
nRBC: 0 % (ref 0.0–0.2)

## 2020-07-27 MED ORDER — TICAGRELOR 90 MG PO TABS: 90.0000 mg | ORAL_TABLET | Freq: Two times a day (BID) | ORAL | 6 refills | Status: DC

## 2020-07-27 NOTE — Telephone Encounter (Signed)
-----   Message from Ralene Bathe, MD sent at 07/26/2020  6:42 PM EDT ----- Skin , left dorsum hand BASAL CELL CARCINOMA, NODULAR PATTERN  Cancer - BCC Schedule surgery

## 2020-07-27 NOTE — Discharge Summary (Signed)
[facility] Cardiology Discharge Summary  [name] MRN: [id number] DOB/AGE: 04-27-1930 86 y.o.  Admit date: 07/26/2020 Discharge date: 07/27/2020  Primary Discharge Diagnosis: Coronary artery disease with angina I25.119 Secondary Discharge Diagnosis high blood pressure and high cholesterol  Significant Diagnostic Studies: Cardiac cath with left ventricular angiogram and selective coronary injection as well as PCI and stent placement of circumflex.  Hospital Course: The patient was admitted to specials for cardiac cath with selective coronary angiogram after full consent, risk and benefits explained, and time out called with all approprate details voiced and discussed. The patient has had progressive canadian class 3 angina with high probability risk stress test consistent with ischemic chest pain and or anginal equivalent with coronary artery risk factors including high blood pressure and high cholesterol. The procedure was performed without complication and it revealed normal left ventricular function with ejection fraction of 60%.  It was found that the patient had severe 1 vessel coronary atherosclerosis with significant circumflex stenosis requiring further intervention. Therefore, the patient had a PCI and drug eluding stent placed without complication. The patient has been ambulating without further significant symptoms and has reached his maximal hospital benefit and will be discharged to home in good condition.  Cardiac rehabilitation has been discussed and recommended. Medication management of cardiovascular risk factors will be given post discharge and modified as an outpatient.   Discharge Exam: Blood pressure 127/64, pulse 76, temperature 98.4 F (36.9 C), temperature source Oral, resp. rate 18, height 5\' 6"  (1.676 m), weight 50.1 kg, SpO2 92 %.  Constitutional: Alet oriented to person, place, and time. No distress.  HENT: No nasal discharge.  Head:  Normocephalic and atraumatic.  Eyes: Pupils are equal and round. No discharge.  Neck: Normal range of motion. Neck supple. No JVD present. No thyromegaly present.  Cardiovascular: Normal rate, regular rhythm, normal S1 S2, no gallop, no friction rub. No murmur Pulmonary/Chest: Effort normal, No stridor. No respiratory distress. no wheezes.  no rales.    Abdominal: Soft. Bowel sounds are normal.  no distension.  no tenderness. There is no rebound and no guarding.  Musculoskeletal: No edema, no cyanosis, normal pulses, no bleeding, Normal range of motion. no tenderness.  Neurological:  alert and oriented to person, place, and time. Coordination normal.  Skin: Skin is warm and dry. No rash noted. No erythema. No pallor.  Psychiatric:  normal mood and affect. behavior is normal.    Labs:   Lab Results  Component Value Date   WBC 7.8 07/27/2020   HGB 13.0 07/27/2020   HCT 39.0 07/27/2020   MCV 95.4 07/27/2020   PLT 228 07/27/2020    Recent Labs  Lab 07/27/20 0552  NA 139  K 3.5  CL 100  CO2 31  BUN 18  CREATININE 0.96  CALCIUM 8.0*  GLUCOSE 114*    EKG: NSR without evidence of new changes  FOLLOW UP IN ONE TO TWO WEEKS Discharge Instructions    AMB Referral to Cardiac Rehabilitation - Phase II   Complete by: As directed    Diagnosis: Coronary Stents   After initial evaluation and assessments completed: Virtual Based Care may be provided alone or in conjunction with Phase 2 Cardiac Rehab based on patient barriers.: Yes     Allergies as of 07/27/2020   No Known Allergies     Medication List    STOP taking these medications   OSTEO BI-FLEX ONE PER DAY PO     TAKE these medications  amLODipine 5 MG tablet Commonly known as: NORVASC Take 1 tablet (5 mg total) by mouth daily. What changed: how much to take   aspirin 81 MG EC tablet Take 1 tablet (81 mg total) by mouth daily. Swallow whole.   atorvastatin 40 MG tablet Commonly known as: LIPITOR TAKE 1 TABLET(40  MG) BY MOUTH DAILY What changed: See the new instructions.   calcium citrate-vitamin D 315-200 MG-UNIT tablet Commonly known as: CITRACAL+D Take 1 tablet by mouth 2 (two) times daily.   irbesartan 75 MG tablet Commonly known as: AVAPRO TAKE 1 TABLET(75 MG) BY MOUTH DAILY What changed: See the new instructions.   levothyroxine 137 MCG tablet Commonly known as: Synthroid Take 1 tablet (137 mcg total) by mouth daily before breakfast. What changed: Another medication with the same name was removed. Continue taking this medication, and follow the directions you see here.   metoprolol succinate 50 MG 24 hr tablet Commonly known as: TOPROL-XL TAKE 1 TABLET BY MOUTH EVERY DAY WITH OR IMMEDIATELY FOLLOWING A MEAL What changed:   how much to take  how to take this  when to take this   multivitamin capsule Take 1 capsule by mouth daily.   PRESERVISION AREDS 2 PO Take 1 tablet by mouth 2 (two) times daily.   Systane 0.4-0.3 % Soln Generic drug: Polyethyl Glycol-Propyl Glycol Place 1 drop into both eyes in the morning, at noon, and at bedtime.   ticagrelor 90 MG Tabs tablet Commonly known as: BRILINTA Take 1 tablet (90 mg total) by mouth 2 (two) times daily.        THE PATIENT  SHALL BRING ALL MEDICATIONS TO FOLLOW UP APPOINTMENT  Signed:  Corey Skains MD, [facility] 07/27/2020, 8:00 AM

## 2020-07-27 NOTE — Telephone Encounter (Signed)
Sounds OK.  I feel we can do the Cancer on her hand while still on blood thinners. Have her keep appt and continue blood thinners as prescribed by Cardiologist.

## 2020-07-27 NOTE — Telephone Encounter (Signed)
Patient informed to continue Aspirin and Brilinta as prescribed by her cardiologist.

## 2020-07-27 NOTE — Telephone Encounter (Signed)
Patient informed of pathology results 

## 2020-07-28 ENCOUNTER — Telehealth: Payer: Self-pay

## 2020-07-28 NOTE — Telephone Encounter (Signed)
Called patient to confirm follow up with Cardiology appointment scheduled post ED observation visit. No answer. Left message to call pcp office if would like to schedule hfu with MD, as needed or if symptoms persist or worsen. Will follow with phone call tomorrow as appropriate.

## 2020-07-29 NOTE — Telephone Encounter (Signed)
Patient confirms she is scheduled with Cardiology and [facility]. No further follow up at this time. Patient to notify office for scheduling with pcp at a later date. Encouraged to call the office as needed.

## 2020-08-01 NOTE — Progress Notes (Addendum)
08/02/2020 10:02 AM   Alexa Pham 12-May-1930 [id number]  Referring provider: Einar Pheasant, MD [address] [place],  [place] [id number] Chief Complaint  Patient presents with  . Other    HPI: Alexa Pham is a 86 y.o. female who is seen today for evaluation and management of a renal mass.   Patient was previously followed by Dr. Jacqlyn Larsen at [facility] for urge incontinence, incomplete bladder emptying and nocturia.   Initially underwent abdominal ultrasound for work-up of elevated LFTs.  This is unremarkable.  Incidental renal mass identified.  Abdominal US on 07/12/2020 revealed solid appearing exophytic mass from the lower pole of the left kidney highly suspicious for renal neoplasm (likely renal cell carcinoma). Mild extrahepatic biliary dilatation post cholecystectomy, likely physiologic in this patient without elevated bilirubin or alkaline phosphatase levels.  CT renal abdomen w/ w/o contrast on 07/19/2020 revealed heterogeneously hyperenhancing exophytic mass of the posterior midportion of the left kidney measuring 3.4 x 2.9 cm. Findings are consistent with renal cell carcinoma. Prominent left retroperitoneal lymph nodes measuring up to 1.2 x 1.0 cm, nonspecific but suspicious for nodal metastatic disease. Innumerable tiny clustered centrilobular nodules of the dependent right lower lobe. More discrete, clustered nodules of the peripheral right lower lobe measuring up to 5 mm. These nodules are new compared to prior CT dated 03/10/2019. These are nonspecific and likely infectious or inflammatory, particularly reflecting atypical mycobacterial infection, although metastatic disease is not strictly Excluded.  Patient has multiple complex medical comorbidities. She has a personal history of left breast cancer s/p mastectomy partial/lumpectomy [phone]).   B, she was recently admitted with unstable angina and underwent percutaneous intervention by Dr. Nehemiah Massed just last  week.  She is currently on continuous aspirin and Brilinta   PMH: Past Medical History:  Diagnosis Date  . Actinic keratosis 01/28/2020   R thumb proximal nail fold   . Atypical mole 05/13/2018   R distal lat thigh  . Atypical mole 09/14/2014   left ant mid thigh  . Atypical mole 01/19/2014   left paraspinal mid to upper back  . Atypical mole 01/19/2014   right back mid to inf scapula  . Atypical mole 01/19/2014   left posterior deltoid medial  . Basal cell carcinoma 01/28/2020   left of midline mid dorsum nose  . Basal cell carcinoma 01/06/2018   left preauricular   . Basal cell carcinoma 11/22/2016   left upper lip  . Basal cell carcinoma 03/17/2015   left lateral elbow  . Basal cell carcinoma 07/22/2014   R medial inferior knee  . History of breast cancer   . History of pneumonia   . Hypertension   . Hypothyroidism   . Macular degeneration   . Squamous cell carcinoma of skin 01/06/2018   right dorsum hand at ring finger MCP  . Squamous cell carcinoma of skin 07/08/2017   left distal dorsum forearm     Surgical History: Past Surgical History:  Procedure Laterality Date  . ABDOMINAL HYSTERECTOMY    . ABDOMINAL HYSTERECTOMY    . APPENDECTOMY    . BREAST LUMPECTOMY    . CATARACT EXTRACTION, BILATERAL    . CHOLECYSTECTOMY    . CORONARY STENT INTERVENTION N/A 07/26/2020   Procedure: CORONARY STENT INTERVENTION;  Surgeon: Isaias Cowman, MD;  Location: [facility];  Service: Cardiovascular;  Laterality: N/A;  . LEFT HEART CATH AND CORONARY ANGIOGRAPHY Left 07/26/2020   Procedure: LEFT HEART CATH AND CORONARY ANGIOGRAPHY;  Surgeon: Corey Skains, MD;  Location: [facility];  Service: Cardiovascular;  Laterality: Left;  . REPLACEMENT TOTAL KNEE    . TONSILLECTOMY AND ADENOIDECTOMY      Home Medications:  Allergies as of 08/02/2020   No Known Allergies     Medication List       Accurate as of August 02, 2020 10:02 AM. If you have  any questions, ask your nurse or doctor.        amLODipine 5 MG tablet Commonly known as: NORVASC Take 1 tablet (5 mg total) by mouth daily. What changed: how much to take   aspirin 81 MG EC tablet Take 1 tablet (81 mg total) by mouth daily. Swallow whole.   atorvastatin 40 MG tablet Commonly known as: LIPITOR TAKE 1 TABLET(40 MG) BY MOUTH DAILY What changed: See the new instructions.   calcium citrate-vitamin D 315-200 MG-UNIT tablet Commonly known as: CITRACAL+D Take 1 tablet by mouth 2 (two) times daily.   irbesartan 75 MG tablet Commonly known as: AVAPRO TAKE 1 TABLET(75 MG) BY MOUTH DAILY What changed: See the new instructions.   levothyroxine 137 MCG tablet Commonly known as: Synthroid Take 1 tablet (137 mcg total) by mouth daily before breakfast.   metoprolol succinate 50 MG 24 hr tablet Commonly known as: TOPROL-XL TAKE 1 TABLET BY MOUTH EVERY DAY WITH OR IMMEDIATELY FOLLOWING A MEAL What changed:   how much to take  how to take this  when to take this   multivitamin capsule Take 1 capsule by mouth daily.   PRESERVISION AREDS 2 PO Take 1 tablet by mouth 2 (two) times daily.   Systane 0.4-0.3 % Soln Generic drug: Polyethyl Glycol-Propyl Glycol Place 1 drop into both eyes in the morning, at noon, and at bedtime.   ticagrelor 90 MG Tabs tablet Commonly known as: BRILINTA Take 1 tablet (90 mg total) by mouth 2 (two) times daily.       Allergies: No Known Allergies  Family History: Family History  Problem Relation Age of Onset  . CAD Mother     Social History:  reports that she quit smoking about 46 years ago. Her smoking use included cigarettes. She has a 9.00 pack-year smoking history. She has never used smokeless tobacco. She reports that she does not drink alcohol and does not use drugs.   Physical Exam: BP 110/72   Pulse 68   Ht 5\' 6"  (1.676 m)   Wt 113 lb (51.3 kg)   BMI 18.24 kg/m   Constitutional:  Alert and oriented, No acute  distress.  Peers younger and more spry than stated age.  Accompanied by husband. HEENT: [place] AT, moist mucus membranes.  Trachea midline, no masses. Cardiovascular: No clubbing, cyanosis, or edema. Respiratory: Normal respiratory effort, no increased work of breathing. Skin: No rashes, bruises or suspicious lesions. Neurologic: Grossly intact, no focal deficits, moving all 4 extremities. Psychiatric: Normal mood and affect.  Laboratory Data:  Lab Results  Component Value Date   CREATININE 0.96 07/27/2020    Lab Results  Component Value Date   HGBA1C 5.5 05/05/2020    Narrative & Impression  CLINICAL DATA:  Characterize left renal mass, past history of left breast cancer  EXAM: CT ABDOMEN WITHOUT AND WITH CONTRAST  TECHNIQUE: Multidetector CT imaging of the abdomen was performed following the standard protocol before and following the bolus administration of intravenous contrast.  CONTRAST:  167mL OMNIPAQUE IOHEXOL 300 MG/ML  SOLN  COMPARISON:  Renal ultrasound, 07/12/2020, CT chest, 03/10/2019  FINDINGS: Lower chest:  Fibrotic scarring of the medial segment right middle lobe and medial right lower lobe (series 3, image 1). Innumerable tiny clustered centrilobular nodules of the dependent right lower lobe (series 3, image 4). More discrete, clustered nodules of the peripheral right lower lobe measuring up to 5 mm (series 3, image 8). These nodules are new compared to prior CT dated 03/10/2019.  Hepatobiliary: No focal liver abnormality is seen. Status post cholecystectomy. Mild postoperative biliary dilatation.  Pancreas: Unremarkable. No pancreatic ductal dilatation or surrounding inflammatory changes.  Spleen: Normal in size without focal abnormality.  Adrenals/Urinary Tract: Adrenal glands are unremarkable. There is a heterogeneously hyperenhancing exophytic mass of the posterior midportion of the left kidney measuring 3.4 x 2.9 cm (series 3, image 48).  No evidence of renal vein invasion. The right kidney is normal. No hydronephrosis.  Stomach/Bowel: Stomach is within normal limits. No evidence of bowel wall thickening, distention, or inflammatory changes.  Vascular/Lymphatic: Aortic atherosclerosis. Prominent left retroperitoneal lymph nodes measuring up to 1.2 x 1.0 cm.  Other: No abdominal wall hernia or abnormality.  Musculoskeletal: No acute or significant osseous findings.  IMPRESSION: 1. There is a heterogeneously hyperenhancing exophytic mass of the posterior midportion of the left kidney measuring 3.4 x 2.9 cm. Findings are consistent with renal cell carcinoma. 2. Prominent left retroperitoneal lymph nodes measuring up to 1.2 x 1.0 cm, nonspecific but suspicious for nodal metastatic disease. 3. Innumerable tiny clustered centrilobular nodules of the dependent right lower lobe. More discrete, clustered nodules of the peripheral right lower lobe measuring up to 5 mm. These nodules are new compared to prior CT dated 03/10/2019. These are nonspecific and likely infectious or inflammatory, particularly reflecting atypical mycobacterial infection, although metastatic disease is not strictly excluded. 4. Aortic Atherosclerosis (ICD10-I70.0).   Electronically Signed   By: Eddie Candle M.D.   On: 07/19/2020 14:44    CT scan was personally reviewed today.  Agree with radiologic interpretation.  Assessment & Plan:    1.  Left lower pole renal mass A solid renal mass raises the suspicion of primary renal malignancy.  We discussed this in detail and in regards to the spectrum of renal masses which includes cysts (pure cysts are considered benign), solid masses and everything in between. The risk of metastasis increases as the size of solid renal mass increases. In general, it is believed that the risk of metastasis for renal masses less than 3-4 cm is small (up to approximately 5%) based mainly on large retrospective  studies. In some cases and especially in patients of older age and multiple comorbidities a surveillance approach may be appropriate. The treatment of solid renal masses includes: surveillance, cryoablation (percutaneous and laparoscopic) in addition to partial and complete nephrectomy (each with option of laparoscopic, robotic and open depending on appropriateness). Furthermore, nephrectomy appears to be an independent risk factor for the development of chronic kidney disease suggesting that nephron sparing approaches should be implored whenever feasible. We reviewed these options in context of the patients current situation as well as the pros and cons of each. For cystic renal masses, we reviewed the Bosniak classification and discussed that Bosniak 3 lesions harbor a 50% chance of malignancy whereas Bosniak 4 cysts have a solid and 90-95% are malignant in nature.   Patient has borderline retroperitoneal lymphadenopathy concerning for early local metastatic disease. I suggest that patient should undergo a dedicated lung CT scan to rule out more distant metastatic disease especially given findings of the basilar lung.  Unfortunately, with the recent  intervention for unstable angina, she will need to be on continuous antiplatelet therapy.  We will discuss this further with Dr. Roland Earl but at this time, does not appear that she is a surgical or procedural candidate.  Additionally, if this is in fact metastatic, she may have little to no benefit from surgical intervention in the form of nephrectomy versus partial nephrectomy.  We had a lengthy discussion today about the natural history of kidney cancer.  We will complete staging as outlined above and the plan to present the patient at tumor board.  I do not think that the lymphadenopathy is going to be approachable for percutaneous biopsy additionally, this would also likely require her to hold her anti-platelet therapy.  She will likely need surveillance  with repeat imaging in about 5 months and reassessment at that time once her antiplatelet therapy can be held.  Again, in the setting of her age and comorbidities she still might not be a surgical candidate at that point time.  We will also get her plugged in with medical oncology to discuss palliative options.  She is agreeable this plan.   -Ambulatory referral to Oncology.    [facility] [address], [place], [place] [address] [phone]  I, Selena Batten, am acting as a scribe for Dr. Hollice Espy.  I have reviewed the above documentation for accuracy and completeness, and I agree with the above.   Hollice Espy, MD  I spent 60 total minutes on the day of the encounter including pre-visit review of the medical record, face-to-face time with the patient, and post visit ordering of labs/imaging/tests.

## 2020-08-02 ENCOUNTER — Telehealth: Payer: Self-pay

## 2020-08-02 ENCOUNTER — Encounter: Payer: Self-pay | Admitting: Urology

## 2020-08-02 ENCOUNTER — Other Ambulatory Visit: Payer: Self-pay

## 2020-08-02 ENCOUNTER — Ambulatory Visit
Admission: RE | Admit: 2020-08-02 | Discharge: 2020-08-02 | Disposition: A | Discharge status: Home / Self Care | Payer: Medicare Other | Source: Ambulatory Visit | Attending: Urology | Admitting: Urology

## 2020-08-02 ENCOUNTER — Ambulatory Visit (INDEPENDENT_AMBULATORY_CARE_PROVIDER_SITE_OTHER): Payer: Medicare Other | Admitting: Urology

## 2020-08-02 VITALS — BP 110/72 | HR 68 | Ht 66.0 in | Wt 113.0 lb

## 2020-08-02 DIAGNOSIS — N2889 Other specified disorders of kidney and ureter: Secondary | ICD-10-CM

## 2020-08-02 DIAGNOSIS — R918 Other nonspecific abnormal finding of lung field: Secondary | ICD-10-CM | POA: Insufficient documentation

## 2020-08-02 MED ORDER — IOHEXOL 300 MG/ML  SOLN
75.0000 mL | Freq: Once | INTRAMUSCULAR | Status: AC | PRN
  Administered 2020-08-02: 60 mL via INTRAVENOUS

## 2020-08-02 NOTE — Telephone Encounter (Signed)
Pt aware and verbalized understanding.  

## 2020-08-02 NOTE — Telephone Encounter (Signed)
-----   Message from Hollice Espy, MD sent at 08/02/2020  1:26 PM EDT ----- Please let this patient know that her lung CT looks essentially consistent with the one from last year, likely chronic scarring and infection and no real concern for metastatic kidney cancer in the lungs.  I will share this report with Dr. Patsey Berthold.  Plan to present this patient Thursday at tumor board and follow-up with me as scheduled.  Medical oncology referral pending.  Hollice Espy, MD

## 2020-08-04 ENCOUNTER — Other Ambulatory Visit: Payer: Medicare Other

## 2020-08-04 ENCOUNTER — Telehealth: Payer: Self-pay | Admitting: Pulmonary Disease

## 2020-08-05 ENCOUNTER — Telehealth: Payer: Self-pay | Admitting: Internal Medicine

## 2020-08-05 NOTE — Telephone Encounter (Signed)
Patient says Dr Patsey Berthold office called and advised changes to her CT scan and setup and appointment for 08/18/20 she is not confused about treatment she is confused because Dr. Erlene Quan had advised her there was no changes, and she sees oncology for her Kidney on 08/08/20 and wants to start treatment as soon as possible and she is afraid this will interfere. She is afraid that if she has a lung infection this will postpone her treatment.

## 2020-08-05 NOTE — Progress Notes (Signed)
Tumor Board Documentation  Alexa Pham was presented by Dr Erlene Quan at our Tumor Board on 08/04/2020, which included representatives from medical oncology, radiation oncology, surgical oncology, internal medicine, navigation, pathology, radiology, surgical, pharmacy, genetics, research, palliative care, pulmonology.  Alexa Pham currently presents as an external consult, for discussion with history of the following treatments: active survellience.  Additionally, we reviewed previous medical and familial history, history of present illness, and recent lab results along with all available histopathologic and imaging studies. The tumor board considered available treatment options and made the following recommendations: Active surveillance    The following procedures/referrals were also placed: No orders of the defined types were placed in this encounter.   Clinical Trial Status: not discussed   Staging used: Not Applicable  AJCC Staging:       Group: Renal and Lung Mass  National site-specific guidelines   were discussed with respect to the case.  Tumor board is a meeting of clinicians from various specialty areas who evaluate and discuss patients for whom a multidisciplinary approach is being considered. Final determinations in the plan of care are those of the provider(s). The responsibility for follow up of recommendations given during tumor board is that of the provider.   Today's extended care, comprehensive team conference, Alexa Pham was not present for the discussion and was not examined.   Multidisciplinary Tumor Board is a multidisciplinary case peer review process.  Decisions discussed in the Multidisciplinary Tumor Board reflect the opinions of the specialists present at the conference without having examined the patient.  Ultimately, treatment and diagnostic decisions rest with the primary provider(s) and the patient.

## 2020-08-05 NOTE — Telephone Encounter (Signed)
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 She was supposed to follow-up with [place] around January time and did not follow-up. We will get her scheduled. She has been asymptomatic in this regard and the treatment for MAI/MAC is very toxic so treatment is reserved for people that are symptomatic. Having said that however she does need management of her bronchiectasis which will help with these infiltrates. I will get her scheduled in the office so we can get her a step up from airway clearance. She had been given an Acapella flutter valve but I doubt that she is using it. I do not see anything consistent with malignancy on the CT. She fits the classic patient type to get MAC/MAI. Added my CMA to the so that the patient can be scheduled for follow-up with me. Renold Don, MD [place]   Patient's son, Robert(DPR) is aware of above message and voiced his understanding.  Herbie Baltimore stated that patient is symptomatic. She is experiencing increased sob. Patient unable to walk 50 yards without stopping to rest.  Appt has been scheduled for 08/18/2020. Herbie Baltimore is questioning if pt should be seen sooner.   Dr. Patsey Berthold, please advise. Thanks

## 2020-08-05 NOTE — Telephone Encounter (Signed)
Patient's son, Herbie Baltimore [facility]) is aware of below message and voiced his understanding.  appt scheduled for 08/08/2020 at 3:00. Nothing further needed.

## 2020-08-05 NOTE — Telephone Encounter (Signed)
Thank you for calling her.  In reviewing, she has now been scheduled an appt with Dr Patsey Berthold on 08/08/20 to discuss the changes on the scan, infection, etc.  They can let us know more regarding recommendations for the kidney procedure.

## 2020-08-05 NOTE — Telephone Encounter (Signed)
The symptoms from the MAI are usually mostly related to cough with copious amounts of sputum produced.  Her shortness of breath may be related to something else.  Having said that lets see if maybe we can work her in on 1 November.  He is in January and never followed up.

## 2020-08-05 NOTE — Telephone Encounter (Signed)
Please call pt and let her know that Larena Glassman is not here and I am seeing pt.  I do not mind talking with her, but need a little more information.

## 2020-08-05 NOTE — Telephone Encounter (Signed)
Patient says he feels better now about getting her questions answered and is satisfied with the outcome.

## 2020-08-05 NOTE — Telephone Encounter (Signed)
Patient state she got a phone call from Dr Patsey Berthold stating that she has lung infection on November 11th.   Kidney cancer stage 1 and would like to start treatment. Just had heart stent so she can not have surgery. Goes to see Dr Tasia Catchings Nov 1 st. States she does not want to wait until Nov 11th to deal with the infection.   States Dr Nicki Reaper wanted her to call.   Patient states she only wanted to speak with Dr Nicki Reaper or Larena Glassman.  Please advise

## 2020-08-06 ENCOUNTER — Other Ambulatory Visit: Payer: Self-pay | Admitting: Internal Medicine

## 2020-08-08 ENCOUNTER — Inpatient Hospital Stay: Payer: Medicare Other | Attending: Oncology | Admitting: Oncology

## 2020-08-08 ENCOUNTER — Other Ambulatory Visit: Payer: Self-pay

## 2020-08-08 ENCOUNTER — Encounter: Payer: Self-pay | Admitting: Pulmonary Disease

## 2020-08-08 ENCOUNTER — Inpatient Hospital Stay: Payer: Medicare Other

## 2020-08-08 ENCOUNTER — Ambulatory Visit (INDEPENDENT_AMBULATORY_CARE_PROVIDER_SITE_OTHER): Payer: Medicare Other | Admitting: Pulmonary Disease

## 2020-08-08 ENCOUNTER — Encounter: Payer: Self-pay | Admitting: Oncology

## 2020-08-08 VITALS — BP 142/72 | HR 60 | Temp 97.8°F | Ht 66.5 in | Wt 118.0 lb

## 2020-08-08 VITALS — BP 149/69 | HR 64 | Temp 97.6°F | Resp 18 | Ht 67.13 in | Wt 114.5 lb

## 2020-08-08 DIAGNOSIS — R0609 Other forms of dyspnea: Secondary | ICD-10-CM

## 2020-08-08 DIAGNOSIS — E039 Hypothyroidism, unspecified: Secondary | ICD-10-CM | POA: Diagnosis not present

## 2020-08-08 DIAGNOSIS — Z87891 Personal history of nicotine dependence: Secondary | ICD-10-CM | POA: Insufficient documentation

## 2020-08-08 DIAGNOSIS — Z85828 Personal history of other malignant neoplasm of skin: Secondary | ICD-10-CM | POA: Insufficient documentation

## 2020-08-08 DIAGNOSIS — Z7982 Long term (current) use of aspirin: Secondary | ICD-10-CM | POA: Insufficient documentation

## 2020-08-08 DIAGNOSIS — Z806 Family history of leukemia: Secondary | ICD-10-CM | POA: Diagnosis not present

## 2020-08-08 DIAGNOSIS — Z853 Personal history of malignant neoplasm of breast: Secondary | ICD-10-CM

## 2020-08-08 DIAGNOSIS — J479 Bronchiectasis, uncomplicated: Secondary | ICD-10-CM

## 2020-08-08 DIAGNOSIS — Z803 Family history of malignant neoplasm of breast: Secondary | ICD-10-CM | POA: Diagnosis not present

## 2020-08-08 DIAGNOSIS — Z79899 Other long term (current) drug therapy: Secondary | ICD-10-CM | POA: Diagnosis not present

## 2020-08-08 DIAGNOSIS — Z8249 Family history of ischemic heart disease and other diseases of the circulatory system: Secondary | ICD-10-CM | POA: Insufficient documentation

## 2020-08-08 DIAGNOSIS — I10 Essential (primary) hypertension: Secondary | ICD-10-CM | POA: Insufficient documentation

## 2020-08-08 DIAGNOSIS — I200 Unstable angina: Secondary | ICD-10-CM | POA: Diagnosis not present

## 2020-08-08 DIAGNOSIS — N2889 Other specified disorders of kidney and ureter: Secondary | ICD-10-CM | POA: Diagnosis present

## 2020-08-08 DIAGNOSIS — Z90710 Acquired absence of both cervix and uterus: Secondary | ICD-10-CM | POA: Insufficient documentation

## 2020-08-08 NOTE — Progress Notes (Signed)
Hematology/Oncology Consult note [facility] [phone] [phone]   Patient Care Team: Einar Pheasant, MD as PCP - General (Internal Medicine)  REFERRING PROVIDER: Hollice Espy, MD  CHIEF COMPLAINTS/REASON FOR VISIT:  Evaluation of renal mass  HISTORY OF PRESENTING ILLNESS:   Alexa Pham is a  86 y.o.  female with PMH listed below was seen in consultation at the request of  Hollice Espy, MD  for evaluation of renal mass.  07/12/2020, abdomen ultrasound complete showed solid appearance exophytic mass from the lower pole of the left kidney highly suspicious for renal neoplasm.Mild extrahepatic biliary dilatation post cholecystectomy, likely physiologic 07/19/2020 CT renal abdomen with and without contrast showed that there is a heterogeneous hyperenhancing exophytic mass on the posterior midportion of the left kidney measuring 3.4 x 3.9 cm.  Findings are consistent with RCC. There was a report of the prominent left retroperitoneal lymph nodes measuring up to 1.2 x 1 cm nonspecific. 08/02/2020 CT chest showed tubular bronchiectasis throughout, extensive bronchiolar plugging and clustered centrilobular nodularity throughout the lungs, many areas improved compared to prior examination.  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  Attention on follow-up. Subpleural radiation fibrosis as well as bandlike scarring and fibrosis in multiple lobes.  Patient reports remote history of  (1995 ) left breast cancer for which she had left lumpectomy and axillary lymph node dissection.  She stopped mammogram screening at age of 36. 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  Including unstable angina and recently underwent percutaneous intervention by cardiology.  Patient is  currently on aspirin and Brilinta. She has been seen by urologist and patient was referred to medical oncologist for further evaluation and discussion. Her case was also reviewed on multidisciplinary tumor board on 08/04/2020.  Today patient reports chronic shortness of breath which seems to be worse since the start of Brilinta.  Denies any bleeding events.  She has an appointment with pulmonology today.  Denies any pain.  Appetite is fair.  Review of Systems  Constitutional: Negative for appetite change, chills, fatigue, fever and unexpected weight change.  HENT:   Negative for hearing loss and voice change.   Eyes: Negative for eye problems.  Respiratory: Positive for cough and shortness of breath. Negative for chest tightness.   Cardiovascular: Negative for chest pain.  Gastrointestinal: Negative for abdominal distention, abdominal pain and blood in stool.  Endocrine: Negative for hot flashes.  Genitourinary: Negative for difficulty urinating and frequency.   Musculoskeletal: Negative for arthralgias.  Skin: Negative for itching and rash.  Neurological: Negative for extremity weakness.  Hematological: Negative for adenopathy.  Psychiatric/Behavioral: Negative for confusion.    MEDICAL HISTORY:  Past Medical History:  Diagnosis Date   Actinic keratosis 01/28/2020   R thumb proximal nail fold    Atypical mole 05/13/2018   R distal lat thigh   Atypical mole 09/14/2014   left ant mid thigh   Atypical mole 01/19/2014   left paraspinal mid to upper back   Atypical mole 01/19/2014   right back mid to inf scapula   Atypical mole 01/19/2014   left posterior deltoid medial   Basal cell carcinoma 01/28/2020   left of midline mid dorsum nose   Basal cell carcinoma 01/06/2018   left preauricular    Basal cell carcinoma 11/22/2016   left upper lip   Basal cell carcinoma  03/17/2015   left lateral elbow   Basal cell carcinoma 07/22/2014   R medial inferior knee   History  of breast cancer    History of pneumonia    Hypertension    Hypothyroidism    Macular degeneration    Squamous cell carcinoma of skin 01/06/2018   right dorsum hand at ring finger MCP   Squamous cell carcinoma of skin 07/08/2017   left distal dorsum forearm     SURGICAL HISTORY: Past Surgical History:  Procedure Laterality Date   ABDOMINAL HYSTERECTOMY     ABDOMINAL HYSTERECTOMY     APPENDECTOMY     BREAST LUMPECTOMY     CATARACT EXTRACTION, BILATERAL     CHOLECYSTECTOMY     CORONARY STENT INTERVENTION N/A 07/26/2020   Procedure: CORONARY STENT INTERVENTION;  Surgeon: Isaias Cowman, MD;  Location: [facility];  Service: Cardiovascular;  Laterality: N/A;   LEFT HEART CATH AND CORONARY ANGIOGRAPHY Left 07/26/2020   Procedure: LEFT HEART CATH AND CORONARY ANGIOGRAPHY;  Surgeon: Corey Skains, MD;  Location: [facility];  Service: Cardiovascular;  Laterality: Left;   REPLACEMENT TOTAL KNEE     TONSILLECTOMY AND ADENOIDECTOMY      SOCIAL HISTORY: Social History   Socioeconomic History   Marital status: Married    Spouse name: Not on file   Number of children: Not on file   Years of education: Not on file   Highest education level: Not on file  Occupational History   Not on file  Tobacco Use   Smoking status: Former Smoker    Packs/day: 1.50    Years: 6.00    Pack years: 9.00    Types: Cigarettes    Quit date: 79    Years since quitting: 46.8   Smokeless tobacco: Never Used  Substance and Sexual Activity   Alcohol use: No    Alcohol/week: 0.0 standard drinks   Drug use: No   Sexual activity: Not on file  Other Topics Concern   Not on file  Social History Narrative   Married    Social Determinants of Health   Financial Resource Strain: Low Risk    Difficulty of Paying Living Expenses: Not hard at all  Food Insecurity: No Food Insecurity   Worried About Charity fundraiser in the Last Year: Never true     Arboriculturist in the Last Year: Never true  Transportation Needs: No Transportation Needs   Lack of Transportation (Medical): No   Lack of Transportation (Non-Medical): No  Physical Activity: Unknown   Days of Exercise per Week: 0 days   Minutes of Exercise per Session: Not on file  Stress: No Stress Concern Present   Feeling of Stress : Not at all  Social Connections:    Frequency of Communication with Friends and Family: Not on file   Frequency of Social Gatherings with Friends and Family: Not on file   Attends Religious Services: Not on Electrical engineer or Organizations: Not on file   Attends Archivist Meetings: Not on file   Marital Status: Not on file  Intimate Partner Violence: Not At Risk   Fear of Current or Ex-Partner: No   Emotionally Abused: No   Physically Abused: No   Sexually Abused: No    FAMILY HISTORY: Family History  Problem Relation Age of Onset   CAD Mother    Cancer Neg Hx     ALLERGIES:  has No Known  Allergies.  MEDICATIONS:  Current Outpatient Medications  Medication Sig Dispense Refill   aspirin EC 81 MG EC tablet Take 1 tablet (81 mg total) by mouth daily. Swallow whole. 30 tablet 11   atorvastatin (LIPITOR) 40 MG tablet TAKE 1 TABLET(40 MG) BY MOUTH DAILY (Patient taking differently: Take 40 mg by mouth daily. ) 90 tablet 1   calcium citrate-vitamin D (CITRACAL+D) 315-200 MG-UNIT tablet Take 1 tablet by mouth 2 (two) times daily.     irbesartan (AVAPRO) 75 MG tablet TAKE 1 TABLET(75 MG) BY MOUTH DAILY (Patient taking differently: Take 75 mg by mouth daily. ) 90 tablet 1   levothyroxine (SYNTHROID) 125 MCG tablet Take 125 mcg by mouth daily.     metoprolol succinate (TOPROL-XL) 50 MG 24 hr tablet TAKE 1 TABLET BY MOUTH EVERY DAY WITH OR IMMEDIATELY FOLLOWING A MEAL (Patient taking differently: Take 50 mg by mouth daily. TAKE 1 TABLET BY MOUTH EVERY DAY WITH OR IMMEDIATELY FOLLOWING A MEAL) 90 tablet  1   Multiple Vitamin (MULTIVITAMIN) capsule Take 1 capsule by mouth daily.     Multiple Vitamins-Minerals (PRESERVISION AREDS 2 PO) Take 1 tablet by mouth 2 (two) times daily.      Polyethyl Glycol-Propyl Glycol (SYSTANE) 0.4-0.3 % SOLN Place 1 drop into both eyes in the morning, at noon, and at bedtime.     ticagrelor (BRILINTA) 90 MG TABS tablet Take 1 tablet (90 mg total) by mouth 2 (two) times daily. 60 tablet 6   amLODipine (NORVASC) 10 MG tablet TAKE 1 TABLET(10 MG) BY MOUTH DAILY 90 tablet 1   No current facility-administered medications for this visit.     PHYSICAL EXAMINATION: ECOG PERFORMANCE STATUS: 1 - Symptomatic but completely ambulatory Vitals:   08/08/20 0953  BP: (!) 149/69  Pulse: 64  Resp: 18  Temp: 97.6 F (36.4 C)   Filed Weights   08/08/20 0953  Weight: 114 lb 8 oz (51.9 kg)    Physical Exam  LABORATORY DATA:  I have reviewed the data as listed Lab Results  Component Value Date   WBC 7.8 07/27/2020   HGB 13.0 07/27/2020   HCT 39.0 07/27/2020   MCV 95.4 07/27/2020   PLT 228 07/27/2020   Recent Labs    05/04/20 0806 05/04/20 0806 05/05/20 0433 05/05/20 0433 06/08/20 0811 06/08/20 0811 06/27/20 0915 07/19/20 1232 07/26/20 1131 07/27/20 0552  NA 143   < > 143   < > 140  --   --   --  141 139  K 3.5   < > 3.3*   < > 3.7  --   --   --  3.7 3.5  CL 105   < > 103   < > 100  --   --   --  101 100  CO2 29   < > 30   < > 33*  --   --   --  34* 31  GLUCOSE 124*   < > 83   < > 92  --   --   --  91 114*  BUN 28*   < > 21   < > 18  --   --   --  18 18  CREATININE 0.91   < > 0.68   < > 0.83   < >  --  0.90 0.83 0.96  CALCIUM 8.4*   < > 7.6*   < > 9.5  --   --   --  8.9 8.0*  GFRNONAA 56*   < > >  60  --   --   --   --   --  >60 53*  GFRAA >60  --  >60  --   --   --   --   --   --   --   PROT 5.7*  --   --   --  6.8  --  6.6  --   --   --   ALBUMIN 3.3*  --   --   --  4.0  --  4.1  --   --   --   AST 31  --   --   --  42*  --  41*  --   --   --    ALT 18  --   --   --  27  --  29  --   --   --   ALKPHOS 48  --   --   --  69  --  71  --   --   --   BILITOT 1.0  --   --   --  0.8  --  0.8  --   --   --   BILIDIR 0.2  --   --   --   --   --  0.2  --   --   --   IBILI 0.8  --   --   --   --   --   --   --   --   --    < > = values in this interval not displayed.   Iron/TIBC/Ferritin/ %Sat No results found for: IRON, TIBC, FERRITIN, IRONPCTSAT    RADIOGRAPHIC STUDIES: I have personally reviewed the radiological images as listed and agreed with the findings in the report. CT CHEST W CONTRAST  Result Date: 08/02/2020 CLINICAL DATA:  Chest staging, left renal cell carcinoma EXAM: CT CHEST WITH CONTRAST TECHNIQUE: Multidetector CT imaging of the chest was performed during intravenous contrast administration. CONTRAST:  78m OMNIPAQUE IOHEXOL 300 MG/ML  SOLN COMPARISON:  CT abdomen, 07/19/2020, CT chest, 03/10/2019 FINDINGS: Cardiovascular: Aortic valve calcifications. Aortic atherosclerosis. Mild cardiomegaly. In three-vessel coronary artery calcifications. No pericardial effusion. Mediastinum/Nodes: No enlarged mediastinal, hilar, or axillary lymph nodes. Thyroid gland, trachea, and esophagus demonstrate no significant findings. Lungs/Pleura: Subpleural radiation fibrosis of the anterior left upper lobe (series 3, image 71). Bandlike scarring and fibrosis of the lingula, lateral segment right middle lobe, and posterior right upper lobe (series 3, image 54, 84, 102). There is tubular bronchiectasis throughout, with extensive bronchiolar plugging and clustered centrilobular nodularity throughout the lungs, many areas improved compared to prior examination dated 03/10/2019, for example in the posterior left upper lobe (series 3, image 51) however with new areas of nodularity, particularly in the right lung base as noted on prior examination of the abdomen (series 3, image 112). No pleural effusion or pneumothorax. Upper Abdomen: No acute abnormality.  Partially imaged enhancing mass of the posterior midportion of the left kidney (series 2, image 167). Status post cholecystectomy. Musculoskeletal: Status post left lumpectomy and axillary lymph node dissection. No chest wall mass or suspicious bone lesions identified. IMPRESSION: 1. There is tubular bronchiectasis throughout, with extensive bronchiolar plugging and clustered centrilobular nodularity throughout the lungs, many areas improved compared to prior examination however with new areas of nodularity, particular in the right lower lobe as noted on prior examination of the abdomen. 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 Metastatic disease would be difficult  to strictly exclude; attention on follow-up required. 2. 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 3. Status post left lumpectomy and axillary lymph node dissection. 4. Partially imaged enhancing mass of the posterior midportion of the left kidney, consistent with renal cell carcinoma and better assessed by prior examination of the abdomen. 5. Coronary artery disease.  Aortic Atherosclerosis (ICD10-I70.0). Electronically Signed   By: Eddie Candle M.D.   On: 08/02/2020 13:18   US Abdomen Complete  Result Date: 07/12/2020 CLINICAL DATA:  Abnormal liver function studies. Previous cholecystectomy. EXAM: ABDOMEN ULTRASOUND COMPLETE COMPARISON:  Aortic ultrasound 11/20/2018. FINDINGS: Gallbladder: Surgically absent. Common bile duct: Diameter: 7 mm. No evidence of choledocholithiasis. Liver: No focal lesion identified. Within normal limits in parenchymal echogenicity. Portal vein is patent on color Doppler imaging with normal direction of blood flow towards the liver. IVC: No abnormality visualized. Pancreas: Visualized portion unremarkable. Spleen:  Size and appearance within normal limits. Right Kidney: Length: 9.8 cm. Echogenicity within normal limits. No mass or hydronephrosis visualized. Left Kidney: Length: 10.4 cm. There is a solid appearing exophytic mass from the lower pole of the left kidney, measuring up to 3.6 x 2.8 x 3.4 cm. This demonstrates internal blood flow on color Doppler and is highly suspicious for renal neoplasm. There is adjacent cyst formation or focal caliectasis in the lower pole, measuring up to 2.0 cm. Abdominal aorta: Atherosclerosis without evidence of aneurysm. Other findings: No ascites. IMPRESSION: 1. Solid appearing exophytic mass from the lower pole of the left kidney highly suspicious for renal neoplasm (likely renal cell carcinoma). Recommend further evaluation with dedicated renal CT or MRI without and with contrast. 2. Mild extrahepatic biliary dilatation post cholecystectomy, likely physiologic in this patient without elevated bilirubin or alkaline phosphatase levels. 3. These results will be called to the ordering clinician or representative by the Radiologist Assistant, and communication documented in the PACS or [employer]. Electronically Signed   By: Richardean Sale M.D.   On: 07/12/2020 15:43   CARDIAC CATHETERIZATION  Result Date: 07/27/2020  Colon Flattery Cx lesion is 90% stenosed.  Prox Cx lesion is 60% stenosed.  Ramus lesion is 99% stenosed.  Prox LAD to Mid LAD lesion is 45% stenosed.  Mid LAD lesion is 55% stenosed.  Prox RCA to Mid RCA lesion is 40% stenosed.  Dist RCA lesion is 50% stenosed.  Prox RCA lesion is 30% stenosed.  86 year old female with hypertension hyperlipidemia and progression of shortness of breath and chest discomfort with physical activity consistent with stable angina with an abnormal stress echocardiogram with lateral myocardial ischemia LV angiogram showing normal LV systolic function with ejection fraction of 60% Critical 90% stenosis of ostial to proximal left circumflex  artery with additional 60% proximal left circumflex not perfectly adjacent to previous lesion Diffuse left anterior descending artery atherosclerosis with maximum stenosis of 55% Diffuse right coronary artery atherosclerosis with maximum 50% Assessment Progressive anginal symptoms with critical ostial left circumflex stenosis with stress test consistent with lateral myocardial ischemia Plan PCI and stent placement of very proximal left circumflex Dual antiplatelet therapy High intensity cholesterol therapy Hypertension control Cardiac rehabilitation   CARDIAC CATHETERIZATION  Result Date: 07/26/2020  Ramus lesion is 95% stenosed.  Ost Cx to Prox Cx lesion is 90% stenosed.  Prox Cx to Mid Cx lesion is 50% stenosed.  A drug-eluting stent was successfully placed using a STENT RESOLUTE ONYX 3.0X12.  Post intervention, there is a 0% residual stenosis.  1.  High-grade 90% stenosis proximal left circumflex 2.  Successful PCI with DES ostial/proximal left circumflex Recommendations 1.  Dual antiplatelet therapy uninterrupted for 1 year   CT RENAL ABD W/WO  Result Date: 07/19/2020 CLINICAL DATA:  Characterize left renal mass, past history of left breast cancer EXAM: CT ABDOMEN WITHOUT AND WITH CONTRAST TECHNIQUE: Multidetector CT imaging of the abdomen was performed following the standard protocol before and following the bolus administration of intravenous contrast. CONTRAST:  125m OMNIPAQUE IOHEXOL 300 MG/ML  SOLN COMPARISON:  Renal ultrasound, 07/12/2020, CT chest, 03/10/2019 FINDINGS: Lower chest: Fibrotic scarring of the medial segment right middle lobe and medial right lower lobe (series 3, image 1). Innumerable tiny clustered centrilobular nodules of the dependent right lower lobe (series 3, image 4). More discrete, clustered nodules of the peripheral right lower lobe measuring up to 5 mm (series 3, image 8). These nodules are new compared to prior CT dated 03/10/2019. Hepatobiliary: No focal liver  abnormality is seen. Status post cholecystectomy. Mild postoperative biliary dilatation. Pancreas: Unremarkable. No pancreatic ductal dilatation or surrounding inflammatory changes. Spleen: Normal in size without focal abnormality. Adrenals/Urinary Tract: Adrenal glands are unremarkable. There is a heterogeneously hyperenhancing exophytic mass of the posterior midportion of the left kidney measuring 3.4 x 2.9 cm (series 3, image 48). No evidence of renal vein invasion. The right kidney is normal. No hydronephrosis. Stomach/Bowel: Stomach is within normal limits. No evidence of bowel wall thickening, distention, or inflammatory changes. Vascular/Lymphatic: Aortic atherosclerosis. Prominent left retroperitoneal lymph nodes measuring up to 1.2 x 1.0 cm. Other: No abdominal wall hernia or abnormality. Musculoskeletal: No acute or significant osseous findings. IMPRESSION: 1. There is a heterogeneously hyperenhancing exophytic mass of the posterior midportion of the left kidney measuring 3.4 x 2.9 cm. Findings are consistent with renal cell carcinoma. 2. Prominent left retroperitoneal lymph nodes measuring up to 1.2 x 1.0 cm, nonspecific but suspicious for nodal metastatic disease. 3. Innumerable tiny clustered centrilobular nodules of the dependent right lower lobe. More discrete, clustered nodules of the peripheral right lower lobe measuring up to 5 mm. These nodules are new compared to prior CT dated 03/10/2019. These are nonspecific and likely infectious or inflammatory, particularly reflecting atypical mycobacterial infection, although metastatic disease is not strictly excluded. 4. Aortic Atherosclerosis (ICD10-I70.0). Electronically Signed   By: AEddie CandleM.D.   On: 07/19/2020 14:44      ASSESSMENT & PLAN:  1. Kidney mass   2. History of breast cancer    #Left kidney mass, 3.4 cm x 2.9 cm.cT1a Patient's case was reviewed on multidisciplinary tumor board.  Both Dr. BErlene Quanand radiologist Dr. YKathlene Cote were not able to appreciate the retroperitoneal lymph node very well.  Renal mass is radiographically consistent with kidney neoplasm.  CT chest findings are most consistent with atypical MAC infection rather than metastasis. I agree with the consensus reached on tumor board that the risk of metastasis from small size of kidney mass 3-4 centimeters is small.  She is not a surgical candidate at this point due to recent intervention for unstable angina, preferred or interrupted continuous antiplatelet therapy.  Agree with the consensus about observation for now. Recommend repeat images in 4 to 6 months for reevaluation. Recommend her to follow up with Urology.   History of breast cancer, Patient has decided to stop mammogram screening due to her age.  She denies other family history of cancer. With the possible diagnosis of RCC, personal  history of breast cancer, I referred to patient for further discussion with genetic counselor to see if she needs genetic testing.  She agrees with the plan.   Orders Placed This Encounter  Procedures   Ambulatory referral to Genetics    Referral Priority:   Routine    Referral Type:   Consultation    Referral Reason:   Specialty Services Required    Number of Visits Requested:   1    All questions were answered. The patient knows to call the clinic with any problems questions or concerns.  cc Hollice Espy, MD   Thank you for this kind referral and the opportunity to participate in the care of this patient. A copy of today's note is routed to referring provider    Earlie Server, MD, PhD Hematology Oncology [facility] Pager- [phone] 08/08/2020

## 2020-08-08 NOTE — Progress Notes (Signed)
Subjective:    [name], female    DOB: 1929/12/12, 86 y.o.   MRN: [id number]  HPI Is an 86 year old very remote/light smoker, who presents for follow-up on the issue of an abnormal CT of the chest.  Recall that she was initially seen on January 2020 at time she had had an issue with pneumonia and films were also positive for bronchiectasis and tree-in-bud type peribronchial nodular opacities.  This is a pattern seen with MAC.  Subsequently we saw her again in June 2020, her CT at that time had improved.  She remained asymptomatic from the MAC standpoint.  She was supposed to see [place] in January 2021 however canceled that appointment.  He is being followed by Dr. Hollice Espy for a renal mass.  Dr. Erlene Quan had ordered a repeat CT chest to exclude metastatic disease in the chest.  The areas in the chest appear improved with some new ones noted.  Again evanescent nodules in tree-in-bud nodularity consistent with MAC.  We are seeing her again for this issue.  In the interim she has had intervention for unstable angina and dyspnea on 20 October.  She had a stent placed in the very proximal left circumflex which apparently was a difficult case.  She has noted paradoxically increased dyspnea since that intervention.  Of note she is on Brilinta.  Prior to this she did not notice any worsening of her dyspnea.  She did have significant coronary artery disease which showed multiple areas of significant stenosis and 2 areas of high-grade stenosis.  She is being followed closely by cardiology for these issues.  She has not had orthopnea or paroxysmal nocturnal dyspnea.  Just notices increasing fatigue and dyspnea on exertion.  No wheezing.  No cough or sputum production no hemoptysis.  No fevers, chills or sweats.    She presented today with her son and DPOA and I reviewed the chest CT with the patient and her son today.   Review of Systems A 10 point review of systems was performed and it is as  noted above otherwise negative.  No Known Allergies  Current Meds  Medication Sig  . amLODipine (NORVASC) 10 MG tablet TAKE 1 TABLET(10 MG) BY MOUTH DAILY  . aspirin EC 81 MG EC tablet Take 1 tablet (81 mg total) by mouth daily. Swallow whole.  Marland Kitchen atorvastatin (LIPITOR) 40 MG tablet TAKE 1 TABLET(40 MG) BY MOUTH DAILY (Patient taking differently: Take 40 mg by mouth daily. )  . calcium citrate-vitamin D (CITRACAL+D) 315-200 MG-UNIT tablet Take 1 tablet by mouth 2 (two) times daily.  . irbesartan (AVAPRO) 75 MG tablet TAKE 1 TABLET(75 MG) BY MOUTH DAILY (Patient taking differently: Take 75 mg by mouth daily. )  . levothyroxine (SYNTHROID) 125 MCG tablet Take 125 mcg by mouth daily.  . metoprolol succinate (TOPROL-XL) 50 MG 24 hr tablet TAKE 1 TABLET BY MOUTH EVERY DAY WITH OR IMMEDIATELY FOLLOWING A MEAL (Patient taking differently: Take 50 mg by mouth daily. TAKE 1 TABLET BY MOUTH EVERY DAY WITH OR IMMEDIATELY FOLLOWING A MEAL)  . Multiple Vitamin (MULTIVITAMIN) capsule Take 1 capsule by mouth daily.  . Multiple Vitamins-Minerals (PRESERVISION AREDS 2 PO) Take 1 tablet by mouth 2 (two) times daily.   Vladimir Faster Glycol-Propyl Glycol (SYSTANE) 0.4-0.3 % SOLN Place 1 drop into both eyes in the morning, at noon, and at bedtime.  . ticagrelor (BRILINTA) 90 MG TABS tablet Take 1 tablet (90 mg total) by mouth 2 (two) times daily.  Immunization History  Administered Date(s) Administered  . Fluad Quad(high Dose 65+) 06/14/2019, 06/10/2020  . Influenza, High Dose Seasonal PF 07/04/2015, 07/02/2017, 07/03/2018  . Influenza-Unspecified 06/22/2016  . PFIZER SARS-COV-2 Vaccination 11/06/2019, 11/27/2019, 07/11/2020  . Pneumococcal Conjugate-13 04/21/2016  . Pneumococcal Polysaccharide-23 05/05/2012  . Tdap 11/09/2014       Objective:   Physical Exam BP (!) 142/72 (BP Location: Left Arm, Cuff Size: Normal)   Pulse 60   Temp 97.8 F (36.6 C) (Temporal)   Ht 5' 6.5" (1.689 m)   Wt 118 lb (53.5  kg)   SpO2 100%   BMI 18.76 kg/m  GENERAL: Slender, well-developed elderly woman in no acute respiratory distress.  Fully ambulatory. HEAD: Normocephalic, atraumatic.  EYES: Pupils equal, round, reactive to light.  No scleral icterus.  MOUTH: Nose/mouth/throat not examined due to masking requirements for COVID 19. NECK: Supple. No thyromegaly. Trachea midline. No JVD.  No adenopathy. PULMONARY: Good air entry bilaterally.  No adventitious sounds. CARDIOVASCULAR: S1 and S2. Regular rate and rhythm.  No rubs, murmurs or gallops heard. ABDOMEN: Benign. MUSCULOSKELETAL: No joint deformity, no clubbing, no edema.  NEUROLOGIC: No overt focal deficit.  No gait disturbance.  Speech is fluent. SKIN: Intact,warm,dry. PSYCH: Mood and behavior normal.  Ambulatory oximetry was performed today: Patient was able to ambulate over 750 feet without difficulty at moderate pace with moderate shortness of breath noted.  Saturations 98 to 99% throughout.   Representative slice of the chest CT performed on 02 August 2020:    Areas of bronchiectasis, tree-in-bud type nodularity, there is residual scarring from prior radiation therapy to the left chest.    Assessment & Plan:     ICD-10-CM   1. Other form of dyspnea  R06.09    Suspect with worsening of dyspnea due to Brilinta Consider switching to Plavix  2. Bronchiectasis without complication ([place])  Y18.5    Due to suspected MAC Patient currently asymptomatic Continue to monitor We will see the patient in follow-up 3 months time   Discussion:  Patient has had increasing shortness of breath since her cardiac catheterization in late October.  She is on Brilinta, I suspect her dyspnea is related to this.  She did not exhibit any oxygen desaturations with ambulation.  As far as her suspected MAC is concerned, she is currently asymptomatic in this regard and will continue to monitor for developing symptoms.  The treatment is quite toxic and poorly  tolerated and requires long-term therapy.  We will continue to observe for now.  I will see the patient in follow-up in 3 months time she is to contact us prior to that time should any new difficulties arise.   Renold Don, MD [facility]   *This note was dictated using voice recognition software/Dragon.  Despite best efforts to proofread, errors can occur which can change the meaning.  Any change was purely unintentional.

## 2020-08-08 NOTE — Patient Instructions (Signed)
You did very well with your oximetry today.  Your oxygen level stayed normal during your walk.   I do not think that your shortness of breath is related to the MAC I think it is related to [place].   I encourage you to talk to Dr. Nehemiah Massed if the symptoms on the Brilinta continue.   We will see you in follow-up in 3 months time call sooner should any new problems arise.

## 2020-08-09 ENCOUNTER — Other Ambulatory Visit: Payer: Self-pay

## 2020-08-09 ENCOUNTER — Ambulatory Visit (INDEPENDENT_AMBULATORY_CARE_PROVIDER_SITE_OTHER): Payer: Medicare Other | Admitting: Dermatology

## 2020-08-09 ENCOUNTER — Telehealth: Payer: Self-pay | Admitting: *Deleted

## 2020-08-09 DIAGNOSIS — E039 Hypothyroidism, unspecified: Secondary | ICD-10-CM

## 2020-08-09 DIAGNOSIS — C44619 Basal cell carcinoma of skin of left upper limb, including shoulder: Secondary | ICD-10-CM

## 2020-08-09 DIAGNOSIS — R945 Abnormal results of liver function studies: Secondary | ICD-10-CM

## 2020-08-09 DIAGNOSIS — R7989 Other specified abnormal findings of blood chemistry: Secondary | ICD-10-CM

## 2020-08-09 MED ORDER — MUPIROCIN 2 % EX OINT: 1.0000 "application " | TOPICAL_OINTMENT | Freq: Every day | CUTANEOUS | 0 refills | Status: DC

## 2020-08-09 NOTE — Telephone Encounter (Signed)
Please place future orders for lab appt.  

## 2020-08-09 NOTE — Progress Notes (Signed)
   Follow-Up Visit   Subjective  Alexa Pham is a 86 y.o. female who presents for the following: Basal Cell Carcinoma (Biopsy proven of left dorsum hand - [facility] today).  The following portions of the chart were reviewed this encounter and updated as appropriate:  Tobacco  Allergies  Meds  Problems  Med Hx  Surg Hx  Fam Hx     Review of Systems:  No other skin or systemic complaints except as noted in HPI or Assessment and Plan.  Objective  Well appearing patient in no apparent distress; mood and affect are within normal limits.  A focused examination was performed including left hand. Relevant physical exam findings are noted in the Assessment and Plan.  Objective  Left Dorsal Hand: 0.9 cm healing biopsy site   Assessment & Plan  Basal cell carcinoma (BCC) of skin of left upper extremity including shoulder Left Dorsal Hand  Skin excision  Lesion length (cm):  0.9 Lesion width (cm):  0.9 Margin per side (cm):  0.2 Total excision diameter (cm):  1.3 Informed consent: discussed and consent obtained   Timeout: patient name, date of birth, surgical site, and procedure verified   Procedure prep:  Patient was prepped and draped in usual sterile fashion Prep type:  Isopropyl alcohol and povidone-iodine Anesthesia: the lesion was anesthetized in a standard fashion   Anesthetic:  1% lidocaine w/ epinephrine 1-100,000 buffered w/ 8.4% NaHCO3 Instrument used: #15 blade   Hemostasis achieved with: pressure   Hemostasis achieved with comment:  Electrocautery Outcome: patient tolerated procedure well with no complications   Post-procedure details: sterile dressing applied and wound care instructions given   Dressing type: bandage and pressure dressing (mupirocin)    Skin repair Complexity:  Complex Final length (cm):  2 Reason for type of repair: reduce tension to allow closure, reduce the risk of dehiscence, infection, and necrosis, reduce subcutaneous dead space and avoid a  hematoma, allow closure of the large defect, preserve normal anatomy, preserve normal anatomical and functional relationships and enhance both functionality and cosmetic results   Undermining: area extensively undermined   Undermining comment:  Undermining defect 1.3 cm Subcutaneous layers (deep stitches):  Suture size:  4-0 Suture type: Vicryl (polyglactin 910)   Subcutaneous suture technique: inverted dermal. Fine/surface layer approximation (top stitches):  Suture size:  4-0 Suture type: nylon   Stitches: simple running   Suture removal (days):  7 Hemostasis achieved with: suture and pressure Outcome: patient tolerated procedure well with no complications   Post-procedure details: sterile dressing applied and wound care instructions given   Dressing type: bandage and pressure dressing (mupirocin)    mupirocin ointment (BACTROBAN) 2 %  Specimen 1 - Surgical pathology Differential Diagnosis: Biopsy proven BCC Check Margins: Yes 0.9 cm healing biopsy site [phone]  Return in about 2 days (around 08/11/2020) for Dressing change.  I, Ashok Cordia, CMA, am acting as scribe for Sarina Ser, MD .  Documentation: I have reviewed the above documentation for accuracy and completeness, and I agree with the above.  Sarina Ser, MD

## 2020-08-09 NOTE — Telephone Encounter (Signed)
Order placed for f/u labs.  

## 2020-08-09 NOTE — Patient Instructions (Signed)

## 2020-08-10 ENCOUNTER — Telehealth: Payer: Self-pay

## 2020-08-10 ENCOUNTER — Other Ambulatory Visit (INDEPENDENT_AMBULATORY_CARE_PROVIDER_SITE_OTHER): Payer: Medicare Other

## 2020-08-10 ENCOUNTER — Encounter: Payer: Self-pay | Admitting: Dermatology

## 2020-08-10 DIAGNOSIS — R7989 Other specified abnormal findings of blood chemistry: Secondary | ICD-10-CM

## 2020-08-10 DIAGNOSIS — R945 Abnormal results of liver function studies: Secondary | ICD-10-CM | POA: Diagnosis not present

## 2020-08-10 DIAGNOSIS — E039 Hypothyroidism, unspecified: Secondary | ICD-10-CM

## 2020-08-10 LAB — HEPATIC FUNCTION PANEL
ALT: 30 U/L (ref 0–35)
AST: 41 U/L — ABNORMAL HIGH (ref 0–37)
Albumin: 4 g/dL (ref 3.5–5.2)
Alkaline Phosphatase: 74 U/L (ref 39–117)
Bilirubin, Direct: 0.3 mg/dL (ref 0.0–0.3)
Total Bilirubin: 1.2 mg/dL (ref 0.2–1.2)
Total Protein: 6.7 g/dL (ref 6.0–8.3)

## 2020-08-10 LAB — TSH: TSH: 3.23 u[IU]/mL (ref 0.35–4.50)

## 2020-08-10 NOTE — Telephone Encounter (Signed)
Pt doing well after yesterday's surgery./sh 

## 2020-08-11 ENCOUNTER — Other Ambulatory Visit: Payer: Self-pay

## 2020-08-11 ENCOUNTER — Ambulatory Visit (INDEPENDENT_AMBULATORY_CARE_PROVIDER_SITE_OTHER): Payer: Medicare Other

## 2020-08-11 DIAGNOSIS — C44619 Basal cell carcinoma of skin of left upper limb, including shoulder: Secondary | ICD-10-CM

## 2020-08-11 DIAGNOSIS — Z4801 Encounter for change or removal of surgical wound dressing: Secondary | ICD-10-CM

## 2020-08-11 NOTE — Progress Notes (Signed)
   Follow-Up Visit   Subjective  Alexa Pham is a 86 y.o. female who presents for the following: Basal Cell Carcinoma (Dressing Change. Swelling started yesterday. Patient also switched from Brilinta to Plavix two days ago. ).    The following portions of the chart were reviewed this encounter and updated as appropriate:     Review of Systems: No other skin or systemic complaints.  Objective  Well appearing patient in no apparent distress; mood and affect are within normal limits.  A focused examination was performed including left hand. Relevant physical exam findings are noted in the Assessment and Plan.  Objective  Left Dorsal Hand: Healing excision site. No evidence of infection, mild edema.  Assessment & Plan  BCC (basal cell carcinoma), hand, left Left Dorsal Hand  Dressing change done today. Area has been cleaned with Puracyn, Mupirocin ointment applied with clean telfa and wrapped with coban.  Patient has been advised mild edema can be normal after this procedure. Patient too keep hand above heart when sitting and relaxing.    Return in about 5 days (around 08/16/2020).   I, Johnsie Kindred, RMA, am acting as scribe for Sarina Ser, MD.

## 2020-08-11 NOTE — Patient Instructions (Signed)
Wound Care Instructions for After Surgery  On the day following your surgery, you should begin doing daily dressing changes until your sutures are removed: Remove the bandage. Cleanse the wound gently with soap and water.  Make sure you then dry the skin surrounding the wound completely or the tape will not stick to the skin. Do not use cotton balls on the wound. After the wound is clean and dry, apply the ointment (either prescription antibiotic prescribed by your doctor or plain Vaseline if nothing was prescribed) gently with a Q-tip. If you are using a bandaid to cover: Apply a bandaid large enough to cover the entire wound. If you do not have a bandaid large enough to cover the wound OR if you are sensitive to bandaid adhesive: Cut a non-stick pad (such as Telfa) to fit the size of the wound.  Cover the wound with the non-stick pad. If the wound is draining, you may want to add a small amount of gauze on top of the non-stick pad for a little added compression to the area. Use tape to seal the area completely.  For the next 1-2 weeks: Be sure to keep the wound moist with ointment 24/7 to ensure best healing. If you are unable to cover the wound with a bandage to hold the ointment in place, you may need to reapply the ointment several times a day. Do not bend over or lift heavy items to reduce the chance of elevated blood pressure to the wound. Do not participate in particularly strenuous activities.  Below is a list of dressing supplies you might need.  Cotton-tipped applicators - Q-tips Gauze pads (2x2 and/or 4x4) - All-Purpose Sponges New and clean tube of petroleum jelly (Vaseline) OR prescription antibiotic ointment if prescribed Either a bandaid large enough to cover the entire wound OR non-stick dressing material (Telfa) and Tape (Paper or Hypafix)  FOR ADULT SURGERY PATIENTS: If you need something for pain relief, you may take 1 extra strength Tylenol (acetaminophen) and 2  ibuprofen (200 mg) together every 4 hours as needed. (Do not take these medications if you are allergic to them or if you know you cannot take them for any other reason). Typically you may only need pain medication for 1-3 days.   Comments on the Post-Operative Period Slight swelling and redness often appear around the wound. This is normal and will disappear within several days following the surgery. The healing wound will drain a brownish-red-yellow discharge during healing. This is a normal phase of wound healing. As the wound begins to heal, the drainage may increase in amount. Again, this drainage is normal. Notify us if the drainage becomes persistently bloody, excessively swollen, or intensely painful or develops a foul odor or red streaks.  The healing wound will also typically be itchy. This is normal. If you have severe or persistent pain, Notify us if the discomfort is severe or persistent. Avoid alcoholic beverages when taking pain medicine.  In Case of Wound Hemorrhage A wound hemorrhage is when the bandage suddenly becomes soaked with bright red blood and flows profusely. If this happens, sit down or lie down with your head elevated. If the wound has a dressing on it, do not remove the dressing. Apply pressure to the existing gauze. If the wound is not covered, use a gauze pad to apply pressure and continue applying the pressure for 20 minutes without peeking. DO NOT COVER THE WOUND WITH A LARGE TOWEL OR WASH CLOTH. Release your hand from the   wound site but do not remove the dressing. If the bleeding has stopped, gently clean around the wound. Leave the dressing in place for 24 hours if possible. This wait time allows the blood vessels to close off so that you do not spark a new round of bleeding by disrupting the newly clotted blood vessels with an immediate dressing change. If the bleeding does not subside, continue to hold pressure for 40 minutes. If bleeding continues, page your  physician, contact an After Hours clinic or go to the Emergency Room.   

## 2020-08-16 ENCOUNTER — Encounter: Payer: Self-pay | Admitting: Dermatology

## 2020-08-16 ENCOUNTER — Ambulatory Visit (INDEPENDENT_AMBULATORY_CARE_PROVIDER_SITE_OTHER): Payer: Medicare Other | Admitting: Dermatology

## 2020-08-16 ENCOUNTER — Other Ambulatory Visit: Payer: Self-pay

## 2020-08-16 DIAGNOSIS — L578 Other skin changes due to chronic exposure to nonionizing radiation: Secondary | ICD-10-CM

## 2020-08-16 DIAGNOSIS — Z4802 Encounter for removal of sutures: Secondary | ICD-10-CM

## 2020-08-16 DIAGNOSIS — C44619 Basal cell carcinoma of skin of left upper limb, including shoulder: Secondary | ICD-10-CM

## 2020-08-16 NOTE — Progress Notes (Signed)
   Follow-Up Visit   Subjective  Alexa Pham is a 86 y.o. female who presents for the following: BCC margins free (L dorsal hand,1 wk f/u pt presents for suture removal).  The following portions of the chart were reviewed this encounter and updated as appropriate:  Tobacco  Allergies  Meds  Problems  Med Hx  Surg Hx  Fam Hx     Review of Systems:  No other skin or systemic complaints except as noted in HPI or Assessment and Plan.  Objective  Well appearing patient in no apparent distress; mood and affect are within normal limits.  A focused examination was performed including L hand. Relevant physical exam findings are noted in the Assessment and Plan.  Objective  Left dorsal hand: Healing excision site   Assessment & Plan    Actinic Damage - chronic, secondary to cumulative UV radiation exposure/sun exposure over time - diffuse scaly erythematous macules with underlying dyspigmentation - Recommend daily broad spectrum sunscreen SPF 30+ to sun-exposed areas, reapply every 2 hours as needed.  - Call for new or changing lesions.  Basal cell carcinoma (BCC) of skin of left upper extremity including shoulder Left dorsal hand  Margins free, bx proven  Healing excision site.  Wound cleansed, sutures removed, wound cleansed and steri strips applied. Discussed pathology results.   Other Related Medications mupirocin ointment (BACTROBAN) 2 %  Return for as scheduled for f/u.   I, Othelia Pulling, RMA, am acting as scribe for Sarina Ser, MD .  Documentation: I have reviewed the above documentation for accuracy and completeness, and I agree with the above.  Sarina Ser, MD

## 2020-08-17 ENCOUNTER — Inpatient Hospital Stay (HOSPITAL_BASED_OUTPATIENT_CLINIC_OR_DEPARTMENT_OTHER): Payer: Medicare Other | Admitting: Licensed Clinical Social Worker

## 2020-08-17 ENCOUNTER — Encounter: Payer: Self-pay | Admitting: Licensed Clinical Social Worker

## 2020-08-17 ENCOUNTER — Inpatient Hospital Stay: Payer: Medicare Other

## 2020-08-17 ENCOUNTER — Encounter: Payer: Self-pay | Admitting: Urology

## 2020-08-17 ENCOUNTER — Ambulatory Visit (INDEPENDENT_AMBULATORY_CARE_PROVIDER_SITE_OTHER): Payer: Medicare Other | Admitting: Urology

## 2020-08-17 VITALS — BP 153/75 | HR 68

## 2020-08-17 DIAGNOSIS — Z803 Family history of malignant neoplasm of breast: Secondary | ICD-10-CM | POA: Insufficient documentation

## 2020-08-17 DIAGNOSIS — Z853 Personal history of malignant neoplasm of breast: Secondary | ICD-10-CM

## 2020-08-17 DIAGNOSIS — N2889 Other specified disorders of kidney and ureter: Secondary | ICD-10-CM | POA: Diagnosis not present

## 2020-08-17 DIAGNOSIS — Z1379 Encounter for other screening for genetic and chromosomal anomalies: Secondary | ICD-10-CM

## 2020-08-17 DIAGNOSIS — N3000 Acute cystitis without hematuria: Secondary | ICD-10-CM

## 2020-08-17 MED ORDER — SULFAMETHOXAZOLE-TRIMETHOPRIM 800-160 MG PO TABS: 1.0000 | ORAL_TABLET | Freq: Two times a day (BID) | ORAL | 0 refills | Status: DC

## 2020-08-17 NOTE — Progress Notes (Signed)
08/17/2020 11:23 AM   [name] 1929/12/06 [id number]  Referring provider: Einar Pheasant, MD [address] [place],  [place] [address]  Chief Complaint  Patient presents with   Follow-up    HPI: 86 year old female who returns today for follow-up of incidental left renal mass.  Please see previous notes for details.  Notably, she underwent a CT of the abdomen with and without contrast on 07/19/2020 which indicated the presence of an exophytic mass in the posterior midportion of the left kidney measuring 3.4 x 2.9 cm.  There is also an adjacent retroperitoneal lymph node which was read as measuring 1.2 x 1.0 cm concerning for possible metastatic disease.  There are also some nonspecific lung findings.  She underwent definitive imaging for her lungs which showed chronic infectious changes.  She is been seen by Dr. Patsey Berthold in follow-up for this.  There is no evidence of metastatic disease to the lungs.  She was presented at tumor board.  Reviewed the CT scan by Dr. Kathlene Cote indicated that the lymph node in question was less concerning than on the initial read was in fact somewhat difficult to appreciate on further review.  There is some uncertainty whether or not this represented metastatic disease but less favored after discussion.  She was seen in follow-up at the cancer center by Dr. Tasia Catchings who agreed with observation based on her tumor board discussion.   In addition to the above, today she reports that she has been having urinary symptoms including urinary urgency frequency and discomfort for the past 3 days.  She is concerned she might have a urinary tract infection.  No fevers or chills.   PMH: Past Medical History:  Diagnosis Date   Actinic keratosis 01/28/2020   R thumb proximal nail fold    Atypical mole 05/13/2018   R distal lat thigh   Atypical mole 09/14/2014   left ant mid thigh   Atypical mole 01/19/2014   left paraspinal mid to upper back    Atypical mole 01/19/2014   right back mid to inf scapula   Atypical mole 01/19/2014   left posterior deltoid medial   Basal cell carcinoma 01/28/2020   left of midline mid dorsum nose   Basal cell carcinoma 01/06/2018   left preauricular    Basal cell carcinoma 11/22/2016   left upper lip   Basal cell carcinoma 03/17/2015   left lateral elbow   Basal cell carcinoma 07/22/2014   R medial inferior knee   Basal cell carcinoma 07/21/2020   Left dorsum hand. Nodular pattern. Excised 08/09/2020, margins free.   Family history of breast cancer    History of breast cancer    History of pneumonia    Hypertension    Hypothyroidism    Macular degeneration    Squamous cell carcinoma of skin 01/06/2018   right dorsum hand at ring finger MCP   Squamous cell carcinoma of skin 07/08/2017   left distal dorsum forearm     Surgical History: Past Surgical History:  Procedure Laterality Date   ABDOMINAL HYSTERECTOMY     ABDOMINAL HYSTERECTOMY     APPENDECTOMY     BREAST LUMPECTOMY     CATARACT EXTRACTION, BILATERAL     CHOLECYSTECTOMY     CORONARY STENT INTERVENTION N/A 07/26/2020   Procedure: CORONARY STENT INTERVENTION;  Surgeon: Isaias Cowman, MD;  Location: [facility];  Service: Cardiovascular;  Laterality: N/A;   LEFT HEART CATH AND CORONARY ANGIOGRAPHY Left 07/26/2020   Procedure: LEFT  HEART CATH AND CORONARY ANGIOGRAPHY;  Surgeon: Corey Skains, MD;  Location: [facility];  Service: Cardiovascular;  Laterality: Left;   REPLACEMENT TOTAL KNEE     TONSILLECTOMY AND ADENOIDECTOMY      Home Medications:  Allergies as of 08/17/2020   No Known Allergies     Medication List       Accurate as of August 17, 2020 11:23 AM. If you have any questions, ask your nurse or doctor.        STOP taking these medications   mupirocin ointment 2 % Commonly known as: BACTROBAN Stopped by: Hollice Espy, MD     TAKE these  medications   amLODipine 10 MG tablet Commonly known as: NORVASC TAKE 1 TABLET(10 MG) BY MOUTH DAILY   aspirin 81 MG EC tablet Take 1 tablet (81 mg total) by mouth daily. Swallow whole.   atorvastatin 40 MG tablet Commonly known as: LIPITOR TAKE 1 TABLET(40 MG) BY MOUTH DAILY What changed: See the new instructions.   calcium citrate-vitamin D 315-200 MG-UNIT tablet Commonly known as: CITRACAL+D Take 1 tablet by mouth 2 (two) times daily.   clopidogrel 75 MG tablet Commonly known as: PLAVIX Take 75 mg by mouth daily.   irbesartan 75 MG tablet Commonly known as: AVAPRO TAKE 1 TABLET(75 MG) BY MOUTH DAILY What changed: See the new instructions.   levothyroxine 125 MCG tablet Commonly known as: SYNTHROID Take 125 mcg by mouth daily.   metoprolol succinate 50 MG 24 hr tablet Commonly known as: TOPROL-XL TAKE 1 TABLET BY MOUTH EVERY DAY WITH OR IMMEDIATELY FOLLOWING A MEAL What changed:   how much to take  how to take this  when to take this   multivitamin capsule Take 1 capsule by mouth daily.   PRESERVISION AREDS 2 PO Take 1 tablet by mouth 2 (two) times daily.   sulfamethoxazole-trimethoprim 800-160 MG tablet Commonly known as: BACTRIM DS Take 1 tablet by mouth every 12 (twelve) hours. Started by: Hollice Espy, MD   Systane 0.4-0.3 % Soln Generic drug: Polyethyl Glycol-Propyl Glycol Place 1 drop into both eyes in the morning, at noon, and at bedtime.   ticagrelor 90 MG Tabs tablet Commonly known as: BRILINTA Take 1 tablet (90 mg total) by mouth 2 (two) times daily.       Allergies: No Known Allergies  Family History: Family History  Problem Relation Age of Onset   CAD Mother    Cancer Maternal Aunt        unk type   Leukemia Maternal Grandmother    Breast cancer Half-Sister        dx 23s    Social History:  reports that she quit smoking about 46 years ago. Her smoking use included cigarettes. She has a 9.00 pack-year smoking history. She  has never used smokeless tobacco. She reports that she does not drink alcohol and does not use drugs.   Physical Exam: BP (!) 153/75    Pulse 68   Constitutional:  Alert and oriented, No acute distress. HEENT: [place] AT, moist mucus membranes.  Trachea midline, no masses. Cardiovascular: No clubbing, cyanosis, or edema. Respiratory: Normal respiratory effort, no increased work of breathing. GI: Abdomen is soft, nontender, nondistended, no abdominal masses GU: No CVA tenderness Lymph: No cervical or inguinal lymphadenopathy. Skin: No rashes, bruises or suspicious lesions. Neurologic: Grossly intact, no focal deficits, moving all 4 extremities. Psychiatric: Normal mood and affect.  Laboratory Data: Lab Results  Component Value Date   WBC  7.8 07/27/2020   HGB 13.0 07/27/2020   HCT 39.0 07/27/2020   MCV 95.4 07/27/2020   PLT 228 07/27/2020    Lab Results  Component Value Date   CREATININE 0.96 07/27/2020    Lab Results  Component Value Date   HGBA1C 5.5 05/05/2020    Urinalysis Urinalysis today with many bacteria, greater than 30 white blood cells, red blood cells concerning for infection   Assessment & Plan:    1. 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  As per above, further review tumor board indicates that this lymph node is less concerning than on initial read for metastatic disease.  In light of her medical comorbidities including recent percutaneous intervention, age and other factors, consensus at tumor were was continued surveillance for the time being.  Case was discussed with Dr. Kathlene Cote and Dr. Tasia Catchings.  We discussed these recommendations at length today.  All of her questions were answered.  She is strongly in favor of continued surveillance.  We will plan for repeat CT abdomen with and without contrast in about 5 months, approximately 6 months from her previous imaging.   - Urinalysis, Complete - CULTURE, URINE  COMPREHENSIVE - CT Abdomen Pelvis W Wo Contrast; Future  2. Acute cystitis without hematuria Urinalysis today along with symptoms consistent with UTI  Plan to treat with Bactrim DS twice daily for 5 days, just based on urine culture and sensitivity data.  Return if symptoms fail to improve.  F/u 6 months with CT scan  Hollice Espy, MD  [facility] [address] [place], [place] [address] [phone]

## 2020-08-17 NOTE — Progress Notes (Signed)
REFERRING PROVIDER: Earlie Server, MD [place],  [place] [address]  PRIMARY PROVIDER:  Einar Pheasant, MD  PRIMARY REASON FOR VISIT:  1. History of breast cancer   2. Family history of breast cancer      HISTORY OF PRESENT ILLNESS:   Alexa Pham, a 86 y.o. female, was seen for a [facility] cancer genetics consultation at the request of Dr. Tasia Catchings due to a personal and family history of cancer.  Alexa Pham presents to clinic today to discuss the possibility of a hereditary predisposition to cancer, genetic testing, and to further clarify her future cancer risks, as well as potential cancer risks for family members.   At the age of 7, Alexa Pham was diagnosed with breast cancer that was treated with lumpectomy. She has history of at least six basal cell carcinomas and 2 squamous cell carcinomas. She recently was found to have a kidney mass that is suspected to be RCC.   CANCER HISTORY:  Oncology History   No history exists.     RISK FACTORS:  Menarche was at age 21.  First live birth at age 21.  OCP use for approximately 0 years.  Ovaries intact: yes.  Hysterectomy: yes.  Menopausal status: postmenopausal.  HRT use: 0 years.   Colonoscopy: yes; normal.   Past Medical History:  Diagnosis Date  . Actinic keratosis 01/28/2020   R thumb proximal nail fold   . Atypical mole 05/13/2018   R distal lat thigh  . Atypical mole 09/14/2014   left ant mid thigh  . Atypical mole 01/19/2014   left paraspinal mid to upper back  . Atypical mole 01/19/2014   right back mid to inf scapula  . Atypical mole 01/19/2014   left posterior deltoid medial  . Basal cell carcinoma 01/28/2020   left of midline mid dorsum nose  . Basal cell carcinoma 01/06/2018   left preauricular   . Basal cell carcinoma 11/22/2016   left upper lip  . Basal cell carcinoma 03/17/2015   left lateral elbow  . Basal cell carcinoma 07/22/2014   R medial inferior knee  . Basal cell carcinoma  07/21/2020   Left dorsum hand. Nodular pattern. Excised 08/09/2020, margins free.  . Family history of breast cancer   . History of breast cancer   . History of pneumonia   . Hypertension   . Hypothyroidism   . Macular degeneration   . Squamous cell carcinoma of skin 01/06/2018   right dorsum hand at ring finger MCP  . Squamous cell carcinoma of skin 07/08/2017   left distal dorsum forearm     Past Surgical History:  Procedure Laterality Date  . ABDOMINAL HYSTERECTOMY    . ABDOMINAL HYSTERECTOMY    . APPENDECTOMY    . BREAST LUMPECTOMY    . CATARACT EXTRACTION, BILATERAL    . CHOLECYSTECTOMY    . CORONARY STENT INTERVENTION N/A 07/26/2020   Procedure: CORONARY STENT INTERVENTION;  Surgeon: Isaias Cowman, MD;  Location: [facility];  Service: Cardiovascular;  Laterality: N/A;  . LEFT HEART CATH AND CORONARY ANGIOGRAPHY Left 07/26/2020   Procedure: LEFT HEART CATH AND CORONARY ANGIOGRAPHY;  Surgeon: Corey Skains, MD;  Location: [facility];  Service: Cardiovascular;  Laterality: Left;  . REPLACEMENT TOTAL KNEE    . TONSILLECTOMY AND ADENOIDECTOMY      Social History   Socioeconomic History  . Marital status: Married    Spouse name: Not on file  . Number of children:  Not on file  . Years of education: Not on file  . Highest education level: Not on file  Occupational History  . Not on file  Tobacco Use  . Smoking status: Former Smoker    Packs/day: 1.50    Years: 6.00    Pack years: 9.00    Types: Cigarettes    Quit date: 1975    Years since quitting: 46.8  . Smokeless tobacco: Never Used  Substance and Sexual Activity  . Alcohol use: No    Alcohol/week: 0.0 standard drinks  . Drug use: No  . Sexual activity: Not on file  Other Topics Concern  . Not on file  Social History Narrative   Married    Social Determinants of Health   Financial Resource Strain: Low Risk   . Difficulty of Paying Living Expenses: Not hard at all  Food  Insecurity: No Food Insecurity  . Worried About Charity fundraiser in the Last Year: Never true  . Ran Out of Food in the Last Year: Never true  Transportation Needs: No Transportation Needs  . Lack of Transportation (Medical): No  . Lack of Transportation (Non-Medical): No  Physical Activity: Unknown  . Days of Exercise per Week: 0 days  . Minutes of Exercise per Session: Not on file  Stress: No Stress Concern Present  . Feeling of Stress : Not at all  Social Connections:   . Frequency of Communication with Friends and Family: Not on file  . Frequency of Social Gatherings with Friends and Family: Not on file  . Attends Religious Services: Not on file  . Active Member of Clubs or Organizations: Not on file  . Attends Archivist Meetings: Not on file  . Marital Status: Not on file     FAMILY HISTORY:  We obtained a detailed, 4-generation family history.  Significant diagnoses are listed below: Family History  Problem Relation Age of Onset  . CAD Mother   . Cancer Maternal Aunt        unk type  . Leukemia Maternal Grandmother   . Breast cancer Half-Sister        dx 79s   Alexa Pham has 3 sons, none have had cancer. She has 1 half sister who had breast cancer in her 86s, living at 81.  Alexa Pham mother died at 76, no history of cancer. Patient had 3 maternal aunts and 1 maternal uncle. One aunt did have cancer, but patient is unsure of the type. No known cancers in maternal cousins, maternal grandmother had leukemia and died at 54. No information about maternal grandfather.  Alexa Pham has no information about biological father's family.   Alexa Pham is unaware of previous family history of genetic testing for hereditary cancer risks. Patient's maternal and paternal ancestors are from the [place]. There is no reported Ashkenazi Jewish ancestry. There is no known consanguinity.   GENETIC COUNSELING ASSESSMENT: Alexa Pham is a 86 y.o. female with a personal  and family history which is somewhat suggestive of a hereditary cancer syndrome and predisposition to cancer. We, therefore, discussed and recommended the following at today's visit.   DISCUSSION: We discussed that approximately 5-10% of breast cancer is hereditary  Most cases of hereditary breast cancer are associated with BRCA1/BRCA2 genes, although there are other genes associated with hereditary breast cancer. There are also genes associated with kidney cancer.  We discussed that testing is beneficial for several reasons including  knowing about other cancer risks, identifying potential screening  and risk-reduction options that may be appropriate, and to understand if other family members could be at risk for cancer and allow them to undergo genetic testing.   We reviewed the characteristics, features and inheritance patterns of hereditary cancer syndromes. We also discussed genetic testing, including the appropriate family members to test, the process of testing, insurance coverage and turn-around-time for results. We discussed the implications of a negative, positive and/or variant of uncertain significant result. We recommended Alexa Pham pursue genetic testing for the [facility] Multi-Cancer gene panel.  The Multi-Cancer Panel offered by [facility] includes sequencing and/or deletion duplication testing of the following 85 genes: AIP, ALK, APC, ATM, AXIN2,BAP1,  BARD1, BLM, BMPR1A, BRCA1, BRCA2, BRIP1, CASR, CDC73, CDH1, CDK4, CDKN1B, CDKN1C, CDKN2A (p14ARF), CDKN2A (p16INK4a), CEBPA, CHEK2, CTNNA1, DICER1, DIS3L2, EGFR (c.2369C>T, p.Thr790Met variant only), EPCAM (Deletion/duplication testing only), FH, FLCN, GATA2, GPC3, GREM1 (Promoter region deletion/duplication testing only), HOXB13 (c.251G>A, p.Gly84Glu), HRAS, KIT, MAX, MEN1, MET, MITF (c.952G>A, p.Glu318Lys variant only), MLH1, MSH2, MSH3, MSH6, MUTYH, NBN, NF1, NF2, NTHL1, PALB2, PDGFRA, PHOX2B, PMS2, POLD1, POLE, POT1, PRKAR1A, PTCH1, PTEN, RAD50,  RAD51C, RAD51D, RB1, RECQL4, RET, RNF43, RUNX1, SDHAF2, SDHA (sequence changes only), SDHB, SDHC, SDHD, SMAD4, SMARCA4, SMARCB1, SMARCE1, STK11, SUFU, TERC, TERT, TMEM127, TP53, TSC1, TSC2, VHL, WRN and WT1.   Based on Alexa Pham's personal and family history of cancer, she does not quite meet medical criteria for genetic testing. She may have an out of pocket cost.  PLAN: After considering the risks, benefits, and limitations, Alexa Pham provided informed consent to pursue genetic testing and the blood sample was sent to [facility] for analysis of the Multi-Cancer Panel. Results should be available within approximately 2-3 weeks' time, at which point they will be disclosed by telephone to Alexa Pham, as will any additional recommendations warranted by these results. Alexa Pham will receive a summary of her genetic counseling visit and a copy of her results once available. This information will also be available in Epic.   Alexa Pham questions were answered to her satisfaction today. Our contact information was provided should additional questions or concerns arise. Thank you for the referral and allowing [place] to share in the care of your patient.   Alexa Rogue, MS, [facility] Genetic Counselor [email or web address] Phone: [phone]  The patient was seen for a total of 25 minutes in face-to-face genetic counseling.  Prospective genetic counseling student Julio Sicks was also present. Dr. Grayland Ormond was available for discussion regarding this case.   _______________________________________________________________________ For Office Staff:  Number of people involved in session: 2 Was an Intern/ student involved with case: yes

## 2020-08-18 ENCOUNTER — Ambulatory Visit: Payer: Medicare Other | Admitting: Pulmonary Disease

## 2020-08-18 LAB — URINALYSIS, COMPLETE
Bilirubin, UA: NEGATIVE
Glucose, UA: NEGATIVE
Ketones, UA: NEGATIVE
Nitrite, UA: NEGATIVE
Specific Gravity, UA: 1.015 (ref 1.005–1.030)
Urobilinogen, Ur: 0.2 mg/dL (ref 0.2–1.0)
pH, UA: 7 (ref 5.0–7.5)

## 2020-08-18 LAB — MICROSCOPIC EXAMINATION: WBC, UA: >30 /hpf — AB (ref 0–5)

## 2020-08-20 LAB — CULTURE, URINE COMPREHENSIVE

## 2020-08-22 ENCOUNTER — Telehealth: Payer: Self-pay | Admitting: Internal Medicine

## 2020-08-22 ENCOUNTER — Ambulatory Visit (INDEPENDENT_AMBULATORY_CARE_PROVIDER_SITE_OTHER): Payer: Medicare Other

## 2020-08-22 VITALS — Ht 66.5 in | Wt 118.0 lb

## 2020-08-22 DIAGNOSIS — Z0000 Encounter for general adult medical examination without abnormal findings: Secondary | ICD-10-CM

## 2020-08-22 NOTE — Telephone Encounter (Signed)
Patient accidentally picked up the 5 mg and took them for a few days before she realized. She has been on the 10 mg for probably 15 years or more and maintained on this dose. She has been back on the 10 mg as of yesterday. BP 119/70 today.

## 2020-08-22 NOTE — Progress Notes (Addendum)
Subjective:   Alexa Pham is a 86 y.o. female who presents for Medicare Annual (Subsequent) preventive examination.  Review of Systems    No ROS.  Medicare Wellness Virtual Visit.   Cardiac Risk Factors include: advanced age (>34men, >8 women);hypertension     Objective:    Today's Vitals   08/22/20 1107  Weight: 118 lb (53.5 kg)  Height: 5' 6.5" (1.689 m)   Body mass index is 18.76 kg/m.  Advanced Directives 08/22/2020 08/08/2020 05/04/2020 05/04/2020 08/20/2019  Does Patient Have a Medical Advance Directive? Yes Yes Yes No Yes  Type of Advance Directive Living will Living will;Healthcare Power of [place];Living will - [place];Living will  Does patient want to make changes to medical advance directive? - - No - Patient declined - No - Patient declined  Would patient like information on creating a medical advance directive? - - No - Patient declined No - Patient declined -    Current Medications (verified) Outpatient Encounter Medications as of 08/22/2020  Medication Sig  . amLODipine (NORVASC) 10 MG tablet TAKE 1 TABLET(10 MG) BY MOUTH DAILY  . aspirin EC 81 MG EC tablet Take 1 tablet (81 mg total) by mouth daily. Swallow whole.  Marland Kitchen atorvastatin (LIPITOR) 40 MG tablet TAKE 1 TABLET(40 MG) BY MOUTH DAILY (Patient taking differently: Take 40 mg by mouth daily. )  . calcium citrate-vitamin D (CITRACAL+D) 315-200 MG-UNIT tablet Take 1 tablet by mouth 2 (two) times daily.  . clopidogrel (PLAVIX) 75 MG tablet Take 75 mg by mouth daily.  . irbesartan (AVAPRO) 75 MG tablet TAKE 1 TABLET(75 MG) BY MOUTH DAILY (Patient taking differently: Take 75 mg by mouth daily. )  . levothyroxine (SYNTHROID) 125 MCG tablet Take 125 mcg by mouth daily.  . metoprolol succinate (TOPROL-XL) 50 MG 24 hr tablet TAKE 1 TABLET BY MOUTH EVERY DAY WITH OR IMMEDIATELY FOLLOWING A MEAL (Patient taking differently: Take 50 mg by mouth daily. TAKE 1 TABLET BY  MOUTH EVERY DAY WITH OR IMMEDIATELY FOLLOWING A MEAL)  . Multiple Vitamin (MULTIVITAMIN) capsule Take 1 capsule by mouth daily.  . Multiple Vitamins-Minerals (PRESERVISION AREDS 2 PO) Take 1 tablet by mouth 2 (two) times daily.   Vladimir Faster Glycol-Propyl Glycol (SYSTANE) 0.4-0.3 % SOLN Place 1 drop into both eyes in the morning, at noon, and at bedtime.  . sulfamethoxazole-trimethoprim (BACTRIM DS) 800-160 MG tablet Take 1 tablet by mouth every 12 (twelve) hours.  . ticagrelor (BRILINTA) 90 MG TABS tablet Take 1 tablet (90 mg total) by mouth 2 (two) times daily.   No facility-administered encounter medications on file as of 08/22/2020.    Allergies (verified) Patient has no known allergies.   History: Past Medical History:  Diagnosis Date  . Actinic keratosis 01/28/2020   R thumb proximal nail fold   . Atypical mole 05/13/2018   R distal lat thigh  . Atypical mole 09/14/2014   left ant mid thigh  . Atypical mole 01/19/2014   left paraspinal mid to upper back  . Atypical mole 01/19/2014   right back mid to inf scapula  . Atypical mole 01/19/2014   left posterior deltoid medial  . Basal cell carcinoma 01/28/2020   left of midline mid dorsum nose  . Basal cell carcinoma 01/06/2018   left preauricular   . Basal cell carcinoma 11/22/2016   left upper lip  . Basal cell carcinoma 03/17/2015   left lateral elbow  . Basal cell carcinoma 07/22/2014   R  medial inferior knee  . Basal cell carcinoma 07/21/2020   Left dorsum hand. Nodular pattern. Excised 08/09/2020, margins free.  . Family history of breast cancer   . History of breast cancer   . History of pneumonia   . Hypertension   . Hypothyroidism   . Macular degeneration   . Squamous cell carcinoma of skin 01/06/2018   right dorsum hand at ring finger MCP  . Squamous cell carcinoma of skin 07/08/2017   left distal dorsum forearm    Past Surgical History:  Procedure Laterality Date  . ABDOMINAL HYSTERECTOMY    .  ABDOMINAL HYSTERECTOMY    . APPENDECTOMY    . BREAST LUMPECTOMY    . CATARACT EXTRACTION, BILATERAL    . CHOLECYSTECTOMY    . CORONARY STENT INTERVENTION N/A 07/26/2020   Procedure: CORONARY STENT INTERVENTION;  Surgeon: Isaias Cowman, MD;  Location: [facility];  Service: Cardiovascular;  Laterality: N/A;  . LEFT HEART CATH AND CORONARY ANGIOGRAPHY Left 07/26/2020   Procedure: LEFT HEART CATH AND CORONARY ANGIOGRAPHY;  Surgeon: Corey Skains, MD;  Location: [facility];  Service: Cardiovascular;  Laterality: Left;  . REPLACEMENT TOTAL KNEE    . TONSILLECTOMY AND ADENOIDECTOMY     Family History  Problem Relation Age of Onset  . CAD Mother   . Cancer Maternal Aunt        unk type  . Leukemia Maternal Grandmother   . Breast cancer Half-Sister        dx 56s   Social History   Socioeconomic History  . Marital status: Married    Spouse name: Not on file  . Number of children: Not on file  . Years of education: Not on file  . Highest education level: Not on file  Occupational History  . Not on file  Tobacco Use  . Smoking status: Former Smoker    Packs/day: 1.50    Years: 6.00    Pack years: 9.00    Types: Cigarettes    Quit date: 1975    Years since quitting: 46.9  . Smokeless tobacco: Never Used  Substance and Sexual Activity  . Alcohol use: No    Alcohol/week: 0.0 standard drinks  . Drug use: No  . Sexual activity: Not on file  Other Topics Concern  . Not on file  Social History Narrative   Married    Social Determinants of Health   Financial Resource Strain: Low Risk   . Difficulty of Paying Living Expenses: Not hard at all  Food Insecurity: No Food Insecurity  . Worried About Charity fundraiser in the Last Year: Never true  . Ran Out of Food in the Last Year: Never true  Transportation Needs: No Transportation Needs  . Lack of Transportation (Medical): No  . Lack of Transportation (Non-Medical): No  Physical Activity: Unknown    . Days of Exercise per Week: 0 days  . Minutes of Exercise per Session: Not on file  Stress: No Stress Concern Present  . Feeling of Stress : Not at all  Social Connections: Unknown  . Frequency of Communication with Friends and Family: More than three times a week  . Frequency of Social Gatherings with Friends and Family: More than three times a week  . Attends Religious Services: Not on file  . Active Member of Clubs or Organizations: Not on file  . Attends Archivist Meetings: Not on file  . Marital Status: Not on file  Tobacco Counseling Counseling given: Not Answered   Clinical Intake:  Pre-visit preparation completed: Yes        Diabetes: No  How often do you need to have someone help you when you read instructions, pamphlets, or other written materials from your doctor or pharmacy?: 1 - Never Interpreter Needed?: No      Activities of Daily Living In your present state of health, do you have any difficulty performing the following activities: 08/22/2020 07/26/2020  Hearing? Y N  Comment Hearing aid -  Vision? N N  Difficulty concentrating or making decisions? N N  Walking or climbing stairs? N -  Dressing or bathing? N N  Doing errands, shopping? N -  Preparing Food and eating ? N -  Using the Toilet? N -  In the past six months, have you accidently leaked urine? Y -  Comment Followed by Urology and pcp. Managed with daily depends. -  Do you have problems with loss of bowel control? N -  Managing your Medications? N -  Managing your Finances? N -  Housekeeping or managing your Housekeeping? Y -  Comment Maid assist -  Some recent data might be hidden    Patient Care Team: Einar Pheasant, MD as PCP - General (Internal Medicine)  Indicate any recent Medical Services you may have received from other than [facility] providers in the past year (date may be approximate).     Assessment:   This is a routine wellness examination for Alexa Pham.  I  connected with Alexa Pham today by telephone and verified that I am speaking with the correct person using two identifiers. Location patient: home Location provider: work Persons participating in the virtual visit: patient, Marine scientist.    I discussed the limitations, risks, security and privacy concerns of performing an evaluation and management service by telephone and the availability of in person appointments. The patient expressed understanding and verbally consented to this telephonic visit.    Interactive audio and video telecommunications were attempted between this provider and patient, however failed, due to patient having technical difficulties OR patient did not have access to video capability.  We continued and completed visit with audio only.  Some vital signs may be absent or patient reported.   Hearing/Vision screen  Hearing Screening   125Hz  250Hz  500Hz  1000Hz  2000Hz  3000Hz  4000Hz  6000Hz  8000Hz   Right ear:           Left ear:           Comments: Hearing aids  Vision Screening Comments: Followed by [facility] Wears corrective lenses Visual acuity not assessed, virtual visit.  They have seen their ophthalmologist.    Dietary issues and exercise activities discussed: Current Exercise Habits: The patient does not participate in regular exercise at present  Goals      Patient Stated   .  Maintain Healthy Lifestyle (pt-stated)      Healthy diet Stay active      Depression Screen PHQ 2/9 Scores 08/22/2020 08/20/2019 07/07/2019  PHQ - 2 Score 0 0 0    Fall Risk Fall Risk  08/22/2020 08/20/2019 07/07/2019  Falls in the past year? 0 0 0  Number falls in past yr: 0 - -  Risk for fall due to : - - Impaired vision  Follow up Falls evaluation completed - Falls evaluation completed   Handrails in use when climbing stairs? Yes Home free of loose throw rugs in walkways, pet beds, electrical cords, etc? Yes  Adequate lighting in your  home to reduce risk of falls? Yes    ASSISTIVE DEVICES UTILIZED TO PREVENT FALLS: Life alert? Yes  Use of a cane, walker or w/c? No  Grab bars in the bathroom? Yes  Shower chair or bench in shower? Yes  Elevated toilet seat or a handicapped toilet? Yes   TIMED UP AND GO: Was the test performed? No . Virtual visit.   Cognitive Function:     6CIT Screen 08/22/2020 08/20/2019  What Year? 0 points 0 points  What month? 0 points 0 points  What time? 0 points 0 points  Count back from 20 0 points 0 points  Months in reverse 0 points 0 points  Repeat phrase 0 points 0 points  Total Score 0 0    Immunizations Immunization History  Administered Date(s) Administered  . Fluad Quad(high Dose 65+) 06/14/2019, 06/10/2020  . Influenza, High Dose Seasonal PF 07/04/2015, 07/02/2017, 07/03/2018  . Influenza-Unspecified 06/22/2016  . PFIZER SARS-COV-2 Vaccination 11/06/2019, 11/27/2019, 07/11/2020  . Pneumococcal Conjugate-13 04/21/2016  . Pneumococcal Polysaccharide-23 05/05/2012  . Tdap 11/09/2014    Health Maintenance There are no preventive care reminders to display for this patient. Health Maintenance  Topic Date Due  . TETANUS/TDAP  11/09/2024  . INFLUENZA VACCINE  Completed  . DEXA SCAN  Completed  . COVID-19 Vaccine  Completed  . PNA vac Low Risk Adult  Completed   Colorectal cancer screening: No longer required.    Mammogram status: No longer required.    Bone density- completed 08/21/18. Repeat every 2 years. Calcium Vitamin D 315-200mg  BID.   Lung Cancer Screening: (Low Dose CT Chest recommended if Age 56-80 years, 30 pack-year currently smoking OR have quit w/in 15years.) does not qualify.   Hepatitis C Screening: does not qualify.  Dental Screening: Recommended annual dental exams for proper oral hygiene  Community Resource Referral / Chronic Care Management: CRR required this visit?  No   CCM required this visit?  No     Plan:   Keep all routine maintenance appointments.   Follow up  10/10/20 @ 11:30  I have personally reviewed and noted the following in the patient's chart:   . Medical and social history . Use of alcohol, tobacco or illicit drugs  . Current medications and supplements . Functional ability and status . Nutritional status . Physical activity . Advanced directives . List of other physicians . Hospitalizations, surgeries, and ER visits in previous 12 months . Vitals . Screenings to include cognitive, depression, and falls . Referrals and appointments  In addition, I have reviewed and discussed with patient certain preventive protocols, quality metrics, and best practice recommendations. A written personalized care plan for preventive services as well as general preventive health recommendations were provided to patient.     Varney Biles, LPN   24/06/7352   Nurse Notes: Patient states her last Rx for amlodipine received from pharmacy was 5mg  and 30 tabs. She has been taking 1 daily per the norm for the last week unaware with no verbal follow up from pharmacy. However, does not recall pcp changing the medication dose. Nurse checked most recent note 06/10/20, No changes in notes that I could see. Last Rx appears to be written for 10mg , 90 days. Patient plans to go back to pharmacy and resolve by resuming last prescribed dose by pcp for 10mg  take 1 daily unless otherwise directed. Encouraged patient to call office if any problems.    Reviewed above information.  See phone message for clarification.  Dr Nicki Reaper

## 2020-08-22 NOTE — Telephone Encounter (Signed)
Patient called back with b/p reading of 119/70

## 2020-08-22 NOTE — Telephone Encounter (Signed)
Received notification from [place] that pt received refill of amlodipine 5mg  #30 from pharmacy.  She had previously been on 10mg  q day.  Per discharge from hospital, amlodipine was changed to 5mg .  It appears when saw cardiology - 10mg  was listed and it appears 10mg  was sent in by [place] on 08/08/20.  Need to know what dose she has been getting from the pharmacy recently - prior to this last refill and for how long.  Also need to know how blood pressure is running.

## 2020-08-22 NOTE — Telephone Encounter (Signed)
Ok.  Continue on 10mg  if has been on and bp doing well.  Let us know if any problems.

## 2020-08-22 NOTE — Telephone Encounter (Signed)
Pt aware.

## 2020-08-22 NOTE — Patient Instructions (Addendum)
Alexa Pham , Thank you for taking time to come for your Medicare Wellness Visit. I appreciate your ongoing commitment to your health goals. Please review the following plan we discussed and let me know if I can assist you in the future.   These are the goals we discussed: Goals      Patient Stated   .  Maintain Healthy Lifestyle (pt-stated)      Healthy diet Stay active       This is a list of the screening recommended for you and due dates:  Health Maintenance  Topic Date Due  . Tetanus Vaccine  11/09/2024  . Flu Shot  Completed  . DEXA scan (bone density measurement)  Completed  . COVID-19 Vaccine  Completed  . Pneumonia vaccines  Completed   Keep all routine maintenance appointments.   Follow up 10/10/20 @ 11:30  Advanced directives: End of life planning; Advance aging; Advanced directives discussed.  Copy of current HCPOA/Living Will requested.    Follow up in one year for your annual wellness visit.   Preventive Care 77 Years and Older, Female Preventive care refers to lifestyle choices and visits with your health care provider that can promote health and wellness. What does preventive care include?  A yearly physical exam. This is also called an annual well check.  Dental exams once or twice a year.  Routine eye exams. Ask your health care provider how often you should have your eyes checked.  Personal lifestyle choices, including:  Daily care of your teeth and gums.  Regular physical activity.  Eating a healthy diet.  Avoiding tobacco and drug use.  Limiting alcohol use.  Practicing safe sex.  Taking low-dose aspirin every day.  Taking vitamin and mineral supplements as recommended by your health care provider. What happens during an annual well check? The services and screenings done by your health care provider during your annual well check will depend on your age, overall health, lifestyle risk factors, and family history of disease. Counseling    Your health care provider may ask you questions about your:  Alcohol use.  Tobacco use.  Drug use.  Emotional well-being.  Home and relationship well-being.  Sexual activity.  Eating habits.  History of falls.  Memory and ability to understand (cognition).  Work and work Statistician.  Reproductive health. Screening  You may have the following tests or measurements:  Height, weight, and BMI.  Blood pressure.  Lipid and cholesterol levels. These may be checked every 5 years, or more frequently if you are over 30 years old.  Skin check.  Lung cancer screening. You may have this screening every year starting at age 26 if you have a 30-pack-year history of smoking and currently smoke or have quit within the past 15 years.  Fecal occult blood test (FOBT) of the stool. You may have this test every year starting at age 20.  Flexible sigmoidoscopy or colonoscopy. You may have a sigmoidoscopy every 5 years or a colonoscopy every 10 years starting at age 55.  Hepatitis C blood test.  Hepatitis B blood test.  Sexually transmitted disease (STD) testing.  Diabetes screening. This is done by checking your blood sugar (glucose) after you have not eaten for a while (fasting). You may have this done every 1-3 years.  Bone density scan. This is done to screen for osteoporosis. You may have this done starting at age 65.  Mammogram. This may be done every 1-2 years. Talk to your health care provider  about how often you should have regular mammograms. Talk with your health care provider about your test results, treatment options, and if necessary, the need for more tests. Vaccines  Your health care provider may recommend certain vaccines, such as:  Influenza vaccine. This is recommended every year.  Tetanus, diphtheria, and acellular pertussis (Tdap, Td) vaccine. You may need a Td booster every 10 years.  Zoster vaccine. You may need this after age 7.  Pneumococcal 13-valent  conjugate (PCV13) vaccine. One dose is recommended after age 71.  Pneumococcal polysaccharide (PPSV23) vaccine. One dose is recommended after age 64. Talk to your health care provider about which screenings and vaccines you need and how often you need them. This information is not intended to replace advice given to you by your health care provider. Make sure you discuss any questions you have with your health care provider. Document Released: 10/21/2015 Document Revised: 06/13/2016 Document Reviewed: 07/26/2015 Elsevier Interactive Patient Education  2017 [place] Prevention in the Home Falls can cause injuries. They can happen to people of all ages. There are many things you can do to make your home safe and to help prevent falls. What can I do on the outside of my home?  Regularly fix the edges of walkways and driveways and fix any cracks.  Remove anything that might make you trip as you walk through a door, such as a raised step or threshold.  Trim any bushes or trees on the path to your home.  Use bright outdoor lighting.  Clear any walking paths of anything that might make someone trip, such as rocks or tools.  Regularly check to see if handrails are loose or broken. Make sure that both sides of any steps have handrails.  Any raised decks and porches should have guardrails on the edges.  Have any leaves, snow, or ice cleared regularly.  Use sand or salt on walking paths during winter.  Clean up any spills in your garage right away. This includes oil or grease spills. What can I do in the bathroom?  Use night lights.  Install grab bars by the toilet and in the tub and shower. Do not use towel bars as grab bars.  Use non-skid mats or decals in the tub or shower.  If you need to sit down in the shower, use a plastic, non-slip stool.  Keep the floor dry. Clean up any water that spills on the floor as soon as it happens.  Remove soap buildup in the tub or  shower regularly.  Attach bath mats securely with double-sided non-slip rug tape.  Do not have throw rugs and other things on the floor that can make you trip. What can I do in the bedroom?  Use night lights.  Make sure that you have a light by your bed that is easy to reach.  Do not use any sheets or blankets that are too big for your bed. They should not hang down onto the floor.  Have a firm chair that has side arms. You can use this for support while you get dressed.  Do not have throw rugs and other things on the floor that can make you trip. What can I do in the kitchen?  Clean up any spills right away.  Avoid walking on wet floors.  Keep items that you use a lot in easy-to-reach places.  If you need to reach something above you, use a strong step stool that has a grab bar.  Keep electrical cords out of the way.  Do not use floor polish or wax that makes floors slippery. If you must use wax, use non-skid floor wax.  Do not have throw rugs and other things on the floor that can make you trip. What can I do with my stairs?  Do not leave any items on the stairs.  Make sure that there are handrails on both sides of the stairs and use them. Fix handrails that are broken or loose. Make sure that handrails are as long as the stairways.  Check any carpeting to make sure that it is firmly attached to the stairs. Fix any carpet that is loose or worn.  Avoid having throw rugs at the top or bottom of the stairs. If you do have throw rugs, attach them to the floor with carpet tape.  Make sure that you have a light switch at the top of the stairs and the bottom of the stairs. If you do not have them, ask someone to add them for you. What else can I do to help prevent falls?  Wear shoes that:  Do not have high heels.  Have rubber bottoms.  Are comfortable and fit you well.  Are closed at the toe. Do not wear sandals.  If you use a stepladder:  Make sure that it is fully  opened. Do not climb a closed stepladder.  Make sure that both sides of the stepladder are locked into place.  Ask someone to hold it for you, if possible.  Clearly mark and make sure that you can see:  Any grab bars or handrails.  First and last steps.  Where the edge of each step is.  Use tools that help you move around (mobility aids) if they are needed. These include:  Canes.  Walkers.  Scooters.  Crutches.  Turn on the lights when you go into a dark area. Replace any light bulbs as soon as they burn out.  Set up your furniture so you have a clear path. Avoid moving your furniture around.  If any of your floors are uneven, fix them.  If there are any pets around you, be aware of where they are.  Review your medicines with your doctor. Some medicines can make you feel dizzy. This can increase your chance of falling. Ask your doctor what other things that you can do to help prevent falls. This information is not intended to replace advice given to you by your health care provider. Make sure you discuss any questions you have with your health care provider. Document Released: 07/21/2009 Document Revised: 03/01/2016 Document Reviewed: 10/29/2014 Elsevier Interactive Patient Education  2017 [employer].

## 2020-08-23 ENCOUNTER — Other Ambulatory Visit: Payer: Self-pay

## 2020-08-23 MED ORDER — AMLODIPINE BESYLATE 10 MG PO TABS
10.0000 mg | ORAL_TABLET | Freq: Every day | ORAL | 1 refills | Status: DC
Start: 2020-08-23 — End: 2021-08-08

## 2020-08-24 ENCOUNTER — Inpatient Hospital Stay (HOSPITAL_BASED_OUTPATIENT_CLINIC_OR_DEPARTMENT_OTHER): Payer: Medicare Other | Admitting: Hospice and Palliative Medicine

## 2020-08-24 DIAGNOSIS — N2889 Other specified disorders of kidney and ureter: Secondary | ICD-10-CM

## 2020-08-24 NOTE — Progress Notes (Signed)
Multidisciplinary Oncology Council Documentation  Alexa Pham was presented by our [facility] on 08/24/2020, which included representatives from:   Palliative Care  Dietitian  Physical/Occupational Therapist  Speech Therapist  Survivorship Nurse  Nurse Navigator  Social work     Alexa Pham currently presents with history of renal mass  We reviewed previous medical and familial history, history of present illness, and recent lab results along with all available histopathologic and imaging studies. The [place] considered available treatment options and made the following recommendations/referrals: No orders of the defined types were placed in this encounter.   The MOC is a meeting of clinicians from various specialty areas who evaluate and discuss patients for whom a multidisciplinary approach is being considered. Final determinations in the plan of care are those of the provider(s).   Todays extended care, comprehensive team conference, Alexa Pham was not present for the discussion and was not examined.

## 2020-08-29 ENCOUNTER — Telehealth: Payer: Self-pay | Admitting: Licensed Clinical Social Worker

## 2020-08-31 ENCOUNTER — Encounter: Payer: Self-pay | Admitting: Licensed Clinical Social Worker

## 2020-08-31 ENCOUNTER — Ambulatory Visit: Payer: Self-pay | Admitting: Licensed Clinical Social Worker

## 2020-08-31 DIAGNOSIS — Z1379 Encounter for other screening for genetic and chromosomal anomalies: Secondary | ICD-10-CM

## 2020-08-31 DIAGNOSIS — Z853 Personal history of malignant neoplasm of breast: Secondary | ICD-10-CM

## 2020-08-31 DIAGNOSIS — N2889 Other specified disorders of kidney and ureter: Secondary | ICD-10-CM

## 2020-08-31 DIAGNOSIS — Z803 Family history of malignant neoplasm of breast: Secondary | ICD-10-CM

## 2020-08-31 NOTE — Progress Notes (Signed)
HPI:  Alexa Pham was previously seen in the [facility] due to a personal history of cancer and concerns regarding a hereditary predisposition to cancer. Please refer to our prior cancer genetics clinic note for more information regarding our discussion, assessment and recommendations, at the time. Alexa Pham recent genetic test results were disclosed to her, as were recommendations warranted by these results. These results and recommendations are discussed in more detail below.  CANCER HISTORY:  Oncology History   No history exists.    FAMILY HISTORY:  We obtained a detailed, 4-generation family history.  Significant diagnoses are listed below: Family History  Problem Relation Age of Onset  . CAD Mother   . Cancer Maternal Aunt        unk type  . Leukemia Maternal Grandmother   . Breast cancer Half-Sister        dx 29s    [name] has 3 sons, none have had cancer. She has 1 half sister who had breast cancer in her 27s, living at 39.  [name] mother died at 61, no history of cancer. Patient had 3 maternal aunts and 1 maternal uncle. One aunt did have cancer, but patient is unsure of the type. No known cancers in maternal cousins, maternal grandmother had leukemia and died at 63. No information about maternal grandfather.  [name] has no information about biological father's family.   [name] is unaware of previous family history of genetic testing for hereditary cancer risks. Patient's maternal and paternal ancestors are from the [place]. There is no reported Ashkenazi Jewish ancestry. There is no known consanguinity.   GENETIC TEST RESULTS: Genetic testing reported out on 08/25/2020 through the [facility] Multi- cancer panel found no pathogenic mutations.   The Multi-Cancer Panel offered by [facility] includes sequencing and/or deletion duplication testing of the following 85 genes: AIP, ALK, APC, ATM, AXIN2,BAP1,  BARD1, BLM, BMPR1A, BRCA1,  BRCA2, BRIP1, CASR, CDC73, CDH1, CDK4, CDKN1B, CDKN1C, CDKN2A (p14ARF), CDKN2A (p16INK4a), CEBPA, CHEK2, CTNNA1, DICER1, DIS3L2, EGFR (c.2369C>T, p.Thr790Met variant only), EPCAM (Deletion/duplication testing only), FH, FLCN, GATA2, GPC3, GREM1 (Promoter region deletion/duplication testing only), HOXB13 (c.251G>A, p.Gly84Glu), HRAS, KIT, MAX, MEN1, MET, MITF (c.952G>A, p.Glu318Lys variant only), MLH1, MSH2, MSH3, MSH6, MUTYH, NBN, NF1, NF2, NTHL1, PALB2, PDGFRA, PHOX2B, PMS2, POLD1, POLE, POT1, PRKAR1A, PTCH1, PTEN, RAD50, RAD51C, RAD51D, RB1, RECQL4, RET, RNF43, RUNX1, SDHAF2, SDHA (sequence changes only), SDHB, SDHC, SDHD, SMAD4, SMARCA4, SMARCB1, SMARCE1, STK11, SUFU, TERC, TERT, TMEM127, TP53, TSC1, TSC2, VHL, WRN and WT1. .   The test report has been scanned into EPIC and is located under the Molecular Pathology section of the Results Review tab.  A portion of the result report is included below for reference.     We discussed with [name] that because current genetic testing is not perfect, it is possible there may be a gene mutation in one of these genes that current testing cannot detect, but that chance is small.  We also discussed, that there could be another gene that has not yet been discovered, or that we have not yet tested, that is responsible for the cancer diagnoses in the family. It is also possible there is a hereditary cause for the cancer in the family that Ms. Endo did not inherit and therefore was not identified in her testing.  Therefore, it is important to remain in touch with cancer genetics in the future so that we can continue to offer [name] the most up to date genetic  testing.    ADDITIONAL GENETIC TESTING: We discussed with [name] that her genetic testing was fairly extensive.  If there are genes identified to increase cancer risk that can be analyzed in the future, we would be happy to discuss and coordinate this testing at that time.    CANCER SCREENING  RECOMMENDATIONS: [name] test result is considered negative (normal).  This means that we have not identified a hereditary cause for her  personal and family history of cancer at this time. Most cancers happen by chance and this negative test suggests that her cancer may fall into this category.    While reassuring, this does not definitively rule out a hereditary predisposition to cancer. It is still possible that there could be genetic mutations that are undetectable by current technology. There could be genetic mutations in genes that have not been tested or identified to increase cancer risk.  Therefore, it is recommended she continue to follow the cancer management and screening guidelines provided by her oncology and primary healthcare provider.   An individual's cancer risk and medical management are not determined by genetic test results alone. Overall cancer risk assessment incorporates additional factors, including personal medical history, family history, and any available genetic information that may result in a personalized plan for cancer prevention and surveillance.  RECOMMENDATIONS FOR FAMILY MEMBERS:  Relatives in this family might be at some increased risk of developing cancer, over the general population risk, simply due to the family history of cancer.  We recommended female relatives in this family have a yearly mammogram beginning at age 55, or 40 years younger than the earliest onset of cancer, an annual clinical breast exam, and perform monthly breast self-exams. Female relatives in this family should also have a gynecological exam as recommended by their primary provider.  All family members should be referred for colonoscopy starting at age 72.   FOLLOW-UP: Lastly, we discussed with [name] that cancer genetics is a rapidly advancing field and it is possible that new genetic tests will be appropriate for her and/or her family members in the future. We encouraged her to  remain in contact with cancer genetics on an annual basis so we can update her personal and family histories and let her know of advances in cancer genetics that may benefit this family.   Our contact number was provided. Ms. Glade questions were answered to her satisfaction, and she knows she is welcome to call us at anytime with additional questions or concerns.   Faith Rogue, MS, [facility] Genetic Counselor [email or web address] Phone: [phone]

## 2020-08-31 NOTE — Telephone Encounter (Signed)
Revealed negative genetic testing.  This normal result is reassuring and indicates that it is unlikely Alexa Pham's cancers were due to a hereditary cause.  It is unlikely that there is an increased risk of another cancer due to a mutation in one of these genes.  However, genetic testing is not perfect, and cannot definitively rule out a hereditary cause.  It will be important for her to keep in contact with genetics to learn if any additional testing may be needed in the future.

## 2020-09-26 ENCOUNTER — Ambulatory Visit (INDEPENDENT_AMBULATORY_CARE_PROVIDER_SITE_OTHER): Payer: Medicare Other | Admitting: Dermatology

## 2020-09-26 ENCOUNTER — Other Ambulatory Visit: Payer: Self-pay

## 2020-09-26 DIAGNOSIS — Z85828 Personal history of other malignant neoplasm of skin: Secondary | ICD-10-CM | POA: Diagnosis not present

## 2020-09-26 DIAGNOSIS — L578 Other skin changes due to chronic exposure to nonionizing radiation: Secondary | ICD-10-CM

## 2020-09-26 DIAGNOSIS — L821 Other seborrheic keratosis: Secondary | ICD-10-CM

## 2020-09-26 DIAGNOSIS — L570 Actinic keratosis: Secondary | ICD-10-CM | POA: Diagnosis not present

## 2020-09-26 DIAGNOSIS — Z872 Personal history of diseases of the skin and subcutaneous tissue: Secondary | ICD-10-CM | POA: Diagnosis not present

## 2020-09-26 NOTE — Progress Notes (Signed)
   Follow-Up Visit   Subjective  Alexa Pham is a 86 y.o. female who presents for the following: Follow-up (AK follow up of left ear, right cheek, nose, left temple, right thumb prox nailfold - treated with LN2 07/21/2020) and Other (History of BCC of left dorsum hand - excised 08/2020).  The following portions of the chart were reviewed this encounter and updated as appropriate:   Tobacco  Allergies  Meds  Problems  Med Hx  Surg Hx  Fam Hx     Review of Systems:  No other skin or systemic complaints except as noted in HPI or Assessment and Plan.  Objective  Well appearing patient in no apparent distress; mood and affect are within normal limits.  A focused examination was performed including face, hands. Relevant physical exam findings are noted in the Assessment and Plan.  Objective  Left dorsum hand: Well healed scar with no evidence of recurrence.   Objective  Left thumb prox nailfold: Clear today   Assessment & Plan    Actinic Damage - chronic, secondary to cumulative UV radiation exposure/sun exposure over time - diffuse scaly erythematous macules with underlying dyspigmentation - Recommend daily broad spectrum sunscreen SPF 30+ to sun-exposed areas, reapply every 2 hours as needed.  - Call for new or changing lesions.  Seborrheic Keratoses - Stuck-on, waxy, tan-brown papules and plaques  - Discussed benign etiology and prognosis. - Observe - Call for any changes   History of basal cell carcinoma (BCC) Left dorsum hand  Clear. Observe for recurrence. Call clinic for new or changing lesions.  Recommend regular skin exams, daily broad-spectrum spf 30+ sunscreen use, and photoprotection.     History of actinic keratoses Left thumb prox nailfold  Clear. Observe for recurrence. Call clinic for new or changing lesions.  Recommend regular skin exams, daily broad-spectrum spf 30+ sunscreen use, and photoprotection.     AK (actinic keratosis) (2) Right Ear x  1, left cheek x 1  Destruction of lesion - Right Ear x 1, left cheek x 1 Complexity: simple   Destruction method: cryotherapy   Informed consent: discussed and consent obtained   Timeout:  patient name, date of birth, surgical site, and procedure verified Lesion destroyed using liquid nitrogen: Yes   Region frozen until ice ball extended beyond lesion: Yes   Outcome: patient tolerated procedure well with no complications   Post-procedure details: wound care instructions given    Return in about 5 months (around 02/24/2021).  I, Ashok Cordia, CMA, am acting as scribe for Sarina Ser, MD .  Documentation: I have reviewed the above documentation for accuracy and completeness, and I agree with the above.  Sarina Ser, MD

## 2020-09-26 NOTE — Patient Instructions (Signed)

## 2020-10-03 ENCOUNTER — Encounter: Payer: Self-pay | Admitting: Dermatology

## 2020-10-06 ENCOUNTER — Other Ambulatory Visit: Payer: Self-pay | Admitting: Internal Medicine

## 2020-10-10 ENCOUNTER — Telehealth (INDEPENDENT_AMBULATORY_CARE_PROVIDER_SITE_OTHER): Payer: Medicare Other | Admitting: Internal Medicine

## 2020-10-10 ENCOUNTER — Encounter: Payer: Self-pay | Admitting: Internal Medicine

## 2020-10-10 DIAGNOSIS — E039 Hypothyroidism, unspecified: Secondary | ICD-10-CM

## 2020-10-10 DIAGNOSIS — J479 Bronchiectasis, uncomplicated: Secondary | ICD-10-CM | POA: Diagnosis not present

## 2020-10-10 DIAGNOSIS — N2889 Other specified disorders of kidney and ureter: Secondary | ICD-10-CM

## 2020-10-10 DIAGNOSIS — E7800 Pure hypercholesterolemia, unspecified: Secondary | ICD-10-CM

## 2020-10-10 DIAGNOSIS — I10 Essential (primary) hypertension: Secondary | ICD-10-CM

## 2020-10-10 DIAGNOSIS — I2510 Atherosclerotic heart disease of native coronary artery without angina pectoris: Secondary | ICD-10-CM | POA: Diagnosis not present

## 2020-10-10 DIAGNOSIS — I700 Atherosclerosis of aorta: Secondary | ICD-10-CM

## 2020-10-10 DIAGNOSIS — F439 Reaction to severe stress, unspecified: Secondary | ICD-10-CM

## 2020-10-10 NOTE — Progress Notes (Signed)
[name], female   DOB: Dec 08, 1929, 87 y.o.   MRN: [id number]   Virtual Visit via telephone Note  This visit type was conducted due to national recommendations for restrictions regarding the COVID-19 pandemic (e.g. social distancing).  This format is felt to be most appropriate for this patient at this time.  All issues noted in this document were discussed and addressed.  No physical exam was performed (except for noted visual exam findings with Video Visits).   I connected with [name] by telephone and verified that I am speaking with the correct person using two identifiers. Location patient: home Location provider: work  Persons participating in the telephone visit: patient, provider  The limitations, risks, security and privacy concerns of performing an evaluation and management service by telephone and the availability of in person appointments have been discussed. It has also been discussed with the patient that there may be a patient responsible charge related to this service. The patient expressed understanding and agreed to proceed.   Reason for visit: scheduled follow up.   HPI: Follow up regarding her blood pressure and recent concern regarding a kidney mass.  Also has a history of CAD s/p stent (LCx) - maintained on beta blocker, statin therapy, aspirin, plavix and ARB. Was previously on Brilinta.  Did not feel well on brilinta - increased sob.  Since being off brilinta has felt better.  No sob.  No chest pain.  Walking on the treadmill.  Energy better.  Eating.  No nausea or vomiting reported.  No abdominal pain.  Bowels stable.  Saw cardiology 09/20/20.   Irbesartan increased to 150mg  q day.  Blood pressures averaging 130s/60-70.  Being followed by urology for left renal mass.  No mets to lung.  Planning for f/u CT in 01/2020.     ROS: See pertinent positives and negatives per HPI.  Past Medical History:  Diagnosis Date   Actinic keratosis 01/28/2020   R  thumb proximal nail fold    Atypical mole 05/13/2018   R distal lat thigh   Atypical mole 09/14/2014   left ant mid thigh   Atypical mole 01/19/2014   left paraspinal mid to upper back   Atypical mole 01/19/2014   right back mid to inf scapula   Atypical mole 01/19/2014   left posterior deltoid medial   Basal cell carcinoma 01/28/2020   left of midline mid dorsum nose   Basal cell carcinoma 01/06/2018   left preauricular    Basal cell carcinoma 11/22/2016   left upper lip   Basal cell carcinoma 03/17/2015   left lateral elbow   Basal cell carcinoma 07/22/2014   R medial inferior knee   Basal cell carcinoma 07/21/2020   Left dorsum hand. Nodular pattern. Excised 08/09/2020, margins free.   Family history of breast cancer    History of breast cancer    History of pneumonia    Hypertension    Hypothyroidism    Macular degeneration    Squamous cell carcinoma of skin 01/06/2018   right dorsum hand at ring finger MCP   Squamous cell carcinoma of skin 07/08/2017   left distal dorsum forearm     Past Surgical History:  Procedure Laterality Date   ABDOMINAL HYSTERECTOMY     ABDOMINAL HYSTERECTOMY     APPENDECTOMY     BREAST LUMPECTOMY     CATARACT EXTRACTION, BILATERAL     CHOLECYSTECTOMY     CORONARY STENT INTERVENTION N/A 07/26/2020   Procedure: CORONARY STENT  INTERVENTION;  Surgeon: Marcina MillardParaschos, Alexander, MD;  Location: [facility];  Service: Cardiovascular;  Laterality: N/A;   LEFT HEART CATH AND CORONARY ANGIOGRAPHY Left 07/26/2020   Procedure: LEFT HEART CATH AND CORONARY ANGIOGRAPHY;  Surgeon: Lamar BlinksKowalski, Bruce J, MD;  Location: [facility];  Service: Cardiovascular;  Laterality: Left;   REPLACEMENT TOTAL KNEE     TONSILLECTOMY AND ADENOIDECTOMY      Family History  Problem Relation Age of Onset   CAD Mother    Cancer Maternal Aunt        unk type   Leukemia Maternal Grandmother    Breast cancer Half-Sister         dx 6870s    SOCIAL HX: reviewed.    Current Outpatient Medications:    irbesartan (AVAPRO) 150 MG tablet, Take 150 mg by mouth daily., Disp: , Rfl:    amLODipine (NORVASC) 10 MG tablet, Take 1 tablet (10 mg total) by mouth daily., Disp: 90 tablet, Rfl: 1   aspirin EC 81 MG EC tablet, Take 1 tablet (81 mg total) by mouth daily. Swallow whole., Disp: 30 tablet, Rfl: 11   atorvastatin (LIPITOR) 40 MG tablet, TAKE 1 TABLET(40 MG) BY MOUTH DAILY (Patient taking differently: Take 40 mg by mouth daily. ), Disp: 90 tablet, Rfl: 1   calcium citrate-vitamin D (CITRACAL+D) 315-200 MG-UNIT tablet, Take 1 tablet by mouth 2 (two) times daily., Disp: , Rfl:    clopidogrel (PLAVIX) 75 MG tablet, Take 75 mg by mouth daily., Disp: , Rfl:    levothyroxine (SYNTHROID) 125 MCG tablet, Take 125 mcg by mouth daily., Disp: , Rfl:    metoprolol succinate (TOPROL-XL) 50 MG 24 hr tablet, Take 1 tablet (50 mg total) by mouth daily. TAKE 1 TABLET BY MOUTH EVERY DAY WITH OR IMMEDIATELY FOLLOWING A MEAL, Disp: 90 tablet, Rfl: 0   Multiple Vitamin (MULTIVITAMIN) capsule, Take 1 capsule by mouth daily., Disp: , Rfl:    Multiple Vitamins-Minerals (PRESERVISION AREDS 2 PO), Take 1 tablet by mouth 2 (two) times daily. , Disp: , Rfl:    Polyethyl Glycol-Propyl Glycol (SYSTANE) 0.4-0.3 % SOLN, Place 1 drop into both eyes in the morning, at noon, and at bedtime., Disp: , Rfl:    sulfamethoxazole-trimethoprim (BACTRIM DS) 800-160 MG tablet, Take 1 tablet by mouth every 12 (twelve) hours., Disp: 14 tablet, Rfl: 0  EXAM:  VITALS per patient if applicable: 138/60-70  GENERAL: alert. Sounds to be in no acute distress.  Answering questions appropriately.   PSYCH/NEURO: pleasant and cooperative, no obvious depression or anxiety, speech and thought processing grossly intact  ASSESSMENT AND PLAN:  Discussed the following assessment and plan:  Problem List Items Addressed This Visit    Hypertension    avapro recently  increased to 150mg  q day.  Also continues amlodipine and metoprolol.  Follow pressures.  Check metabolic panel with next labs.       Relevant Medications   irbesartan (AVAPRO) 150 MG tablet   Other Relevant Orders   Basic metabolic panel   Hypothyroidism    On thyroid replacement.  Follow tsh.       Relevant Orders   TSH   Hypercholesterolemia    Declines cholesterol medication.  Follow lipid panel.       Relevant Medications   irbesartan (AVAPRO) 150 MG tablet   Other Relevant Orders   Lipid panel   Hepatic function panel   Bronchiectasis without complication (HCC)    Breathing better.  Energy better.  Aortic atherosclerosis (HCC)    Declines statin medication.       Relevant Medications   irbesartan (AVAPRO) 150 MG tablet   Stress    Overall appears to be handling stress relatively well.        CAD (coronary artery disease)    Saw cardiology 09/20/20 - f/u CAD s/p stent placement.  On plavix and aspirin.  Off brilinta.  Feeling better.  Breathing better. Follow.        Relevant Medications   irbesartan (AVAPRO) 150 MG tablet   Kidney mass    Being followed by urology.  No mets to lung.  Elected continue observation.  Planning for f/u CT 01/2021.            I discussed the assessment and treatment plan with the patient. The patient was provided an opportunity to ask questions and all were answered. The patient agreed with the plan and demonstrated an understanding of the instructions.   The patient was advised to call back or seek an in-person evaluation if the symptoms worsen or if the condition fails to improve as anticipated.  I provided 22 minutes of non-face-to-face time during this encounter.   Dale [place], MD

## 2020-10-11 ENCOUNTER — Encounter: Payer: Self-pay | Admitting: Internal Medicine

## 2020-10-11 NOTE — Assessment & Plan Note (Signed)
Declines statin medication.   

## 2020-10-11 NOTE — Assessment & Plan Note (Signed)
Overall appears to be handling stress relatively well.   

## 2020-10-11 NOTE — Assessment & Plan Note (Signed)
avapro recently increased to 150mg  q day.  Also continues amlodipine and metoprolol.  Follow pressures.  Check metabolic panel with next labs.

## 2020-10-11 NOTE — Assessment & Plan Note (Signed)
Declines cholesterol medication.  Follow lipid panel.  

## 2020-10-11 NOTE — Assessment & Plan Note (Signed)
Being followed by urology.  No mets to lung.  Elected continue observation.  Planning for f/u CT 01/2021.

## 2020-10-11 NOTE — Assessment & Plan Note (Signed)
Breathing better.  Energy better.

## 2020-10-11 NOTE — Progress Notes (Incomplete)
[name], female   DOB: 11-05-1929, 87 y.o.   MRN: [id number]   Virtual Visit via telephone Note  This visit type was conducted due to national recommendations for restrictions regarding the COVID-19 pandemic (e.g. social distancing).  This format is felt to be most appropriate for this patient at this time.  All issues noted in this document were discussed and addressed.  No physical exam was performed (except for noted visual exam findings with Video Visits).   I connected with [name] by telephone and verified that I am speaking with the correct person using two identifiers. Location patient: home Location provider: work  Persons participating in the telephone visit: patient, provider  The limitations, risks, security and privacy concerns of performing an evaluation and management service by telephone and the availability of in person appointments have been discussed. It has also been discussed with the patient that there may be a patient responsible charge related to this service. The patient expressed understanding and agreed to proceed.   Reason for visit: scheduled follow up.   HPI: Follow up regarding her blood pressure and recent concern regarding a kidney mass.  Also has a history of CAD s/p stent (LCx) - maintained on beta blocker, statin therapy, aspirin, plavix and ARB. Was previously on Brilinta.  Did not feel well on brilinta - increased sob.  Since being off brilinta has felt better.  No sob.  No chest pain.  Walking on the treadmill.  Energy better.  Eating.  No nausea or vomiting reported.  No abdominal pain.  Bowels stable.  Saw cardiology 09/20/20.   Irbesartan increased to 150mg  q day.  Blood pressures averaging 130s/60-70.  Being followed by urology for left renal mass.  No mets to lung.  Planning for f/u CT in 01/2020.     ROS: See pertinent positives and negatives per HPI.  Past Medical History:  Diagnosis Date  . Actinic keratosis 01/28/2020   R  thumb proximal nail fold   . Atypical mole 05/13/2018   R distal lat thigh  . Atypical mole 09/14/2014   left ant mid thigh  . Atypical mole 01/19/2014   left paraspinal mid to upper back  . Atypical mole 01/19/2014   right back mid to inf scapula  . Atypical mole 01/19/2014   left posterior deltoid medial  . Basal cell carcinoma 01/28/2020   left of midline mid dorsum nose  . Basal cell carcinoma 01/06/2018   left preauricular   . Basal cell carcinoma 11/22/2016   left upper lip  . Basal cell carcinoma 03/17/2015   left lateral elbow  . Basal cell carcinoma 07/22/2014   R medial inferior knee  . Basal cell carcinoma 07/21/2020   Left dorsum hand. Nodular pattern. Excised 08/09/2020, margins free.  . Family history of breast cancer   . History of breast cancer   . History of pneumonia   . Hypertension   . Hypothyroidism   . Macular degeneration   . Squamous cell carcinoma of skin 01/06/2018   right dorsum hand at ring finger MCP  . Squamous cell carcinoma of skin 07/08/2017   left distal dorsum forearm     Past Surgical History:  Procedure Laterality Date  . ABDOMINAL HYSTERECTOMY    . ABDOMINAL HYSTERECTOMY    . APPENDECTOMY    . BREAST LUMPECTOMY    . CATARACT EXTRACTION, BILATERAL    . CHOLECYSTECTOMY    . CORONARY STENT INTERVENTION N/A 07/26/2020   Procedure: CORONARY STENT  INTERVENTION;  Surgeon: Marcina Millard, MD;  Location: [facility];  Service: Cardiovascular;  Laterality: N/A;  . LEFT HEART CATH AND CORONARY ANGIOGRAPHY Left 07/26/2020   Procedure: LEFT HEART CATH AND CORONARY ANGIOGRAPHY;  Surgeon: Lamar Blinks, MD;  Location: [facility];  Service: Cardiovascular;  Laterality: Left;  . REPLACEMENT TOTAL KNEE    . TONSILLECTOMY AND ADENOIDECTOMY      Family History  Problem Relation Age of Onset  . CAD Mother   . Cancer Maternal Aunt        unk type  . Leukemia Maternal Grandmother   . Breast cancer Half-Sister         dx 36s    SOCIAL HX: reviewed.    Current Outpatient Medications:  .  irbesartan (AVAPRO) 150 MG tablet, Take 150 mg by mouth daily., Disp: , Rfl:  .  amLODipine (NORVASC) 10 MG tablet, Take 1 tablet (10 mg total) by mouth daily., Disp: 90 tablet, Rfl: 1 .  aspirin EC 81 MG EC tablet, Take 1 tablet (81 mg total) by mouth daily. Swallow whole., Disp: 30 tablet, Rfl: 11 .  atorvastatin (LIPITOR) 40 MG tablet, TAKE 1 TABLET(40 MG) BY MOUTH DAILY (Patient taking differently: Take 40 mg by mouth daily. ), Disp: 90 tablet, Rfl: 1 .  calcium citrate-vitamin D (CITRACAL+D) 315-200 MG-UNIT tablet, Take 1 tablet by mouth 2 (two) times daily., Disp: , Rfl:  .  clopidogrel (PLAVIX) 75 MG tablet, Take 75 mg by mouth daily., Disp: , Rfl:  .  levothyroxine (SYNTHROID) 125 MCG tablet, Take 125 mcg by mouth daily., Disp: , Rfl:  .  metoprolol succinate (TOPROL-XL) 50 MG 24 hr tablet, Take 1 tablet (50 mg total) by mouth daily. TAKE 1 TABLET BY MOUTH EVERY DAY WITH OR IMMEDIATELY FOLLOWING A MEAL, Disp: 90 tablet, Rfl: 0 .  Multiple Vitamin (MULTIVITAMIN) capsule, Take 1 capsule by mouth daily., Disp: , Rfl:  .  Multiple Vitamins-Minerals (PRESERVISION AREDS 2 PO), Take 1 tablet by mouth 2 (two) times daily. , Disp: , Rfl:  .  Polyethyl Glycol-Propyl Glycol (SYSTANE) 0.4-0.3 % SOLN, Place 1 drop into both eyes in the morning, at noon, and at bedtime., Disp: , Rfl:  .  sulfamethoxazole-trimethoprim (BACTRIM DS) 800-160 MG tablet, Take 1 tablet by mouth every 12 (twelve) hours., Disp: 14 tablet, Rfl: 0  EXAM:  VITALS per patient if applicable: 138/60-70  GENERAL: alert. Sounds to be in no acute distress.  Answering questions appropriately.   PSYCH/NEURO: pleasant and cooperative, no obvious depression or anxiety, speech and thought processing grossly intact  ASSESSMENT AND PLAN:  Discussed the following assessment and plan:  Problem List Items Addressed This Visit    Hypertension    avapro recently  increased to 150mg  q day.  Also continues amlodipine and metoprolol.  Follow pressures.  Check metabolic panel with next labs.       Relevant Medications   irbesartan (AVAPRO) 150 MG tablet   Hypothyroidism    On thyroid replacement.  Follow tsh.       Hypercholesterolemia    Declines cholesterol medication.  Follow lipid panel.       Relevant Medications   irbesartan (AVAPRO) 150 MG tablet   Bronchiectasis without complication (HCC)    Breathing better.  Energy better.       Aortic atherosclerosis (HCC)    Declines statin medication.       Relevant Medications   irbesartan (AVAPRO) 150 MG tablet   Stress  Overall appears to be handling stress relatively well.        CAD (coronary artery disease)    Saw cardiology 09/20/20 - f/u CAD s/p stent placement.  On plavix and aspirin.  Off brilinta.  Feeling better.  Breathing better. Follow.        Relevant Medications   irbesartan (AVAPRO) 150 MG tablet   Kidney mass    Being followed by urology.  No mets to lung.  Elected continue observation.  Planning for f/u CT 01/2021.            I discussed the assessment and treatment plan with the patient. The patient was provided an opportunity to ask questions and all were answered. The patient agreed with the plan and demonstrated an understanding of the instructions.   The patient was advised to call back or seek an in-person evaluation if the symptoms worsen or if the condition fails to improve as anticipated.  I provided 22 minutes of non-face-to-face time during this encounter.   Einar Pheasant, MD

## 2020-10-11 NOTE — Assessment & Plan Note (Signed)
On thyroid replacement.  Follow tsh.  

## 2020-10-11 NOTE — Assessment & Plan Note (Signed)
Saw cardiology 09/20/20 - f/u CAD s/p stent placement.  On plavix and aspirin.  Off brilinta.  Feeling better.  Breathing better. Follow.

## 2020-10-16 ENCOUNTER — Encounter: Payer: Self-pay | Admitting: Internal Medicine

## 2020-10-30 ENCOUNTER — Other Ambulatory Visit: Payer: Self-pay | Admitting: Internal Medicine

## 2020-11-15 ENCOUNTER — Encounter (INDEPENDENT_AMBULATORY_CARE_PROVIDER_SITE_OTHER): Payer: Medicare Other | Admitting: Ophthalmology

## 2020-11-15 ENCOUNTER — Other Ambulatory Visit: Payer: Self-pay

## 2020-11-15 DIAGNOSIS — H353121 Nonexudative age-related macular degeneration, left eye, early dry stage: Secondary | ICD-10-CM | POA: Diagnosis not present

## 2020-11-15 DIAGNOSIS — H35033 Hypertensive retinopathy, bilateral: Secondary | ICD-10-CM | POA: Diagnosis not present

## 2020-11-15 DIAGNOSIS — I10 Essential (primary) hypertension: Secondary | ICD-10-CM

## 2020-11-15 DIAGNOSIS — H35372 Puckering of macula, left eye: Secondary | ICD-10-CM | POA: Diagnosis not present

## 2020-11-15 DIAGNOSIS — H43813 Vitreous degeneration, bilateral: Secondary | ICD-10-CM

## 2020-12-26 ENCOUNTER — Other Ambulatory Visit: Payer: Self-pay | Admitting: Internal Medicine

## 2021-01-03 ENCOUNTER — Ambulatory Visit
Admission: RE | Admit: 2021-01-03 | Discharge: 2021-01-03 | Disposition: A | Discharge status: Home / Self Care | Payer: Medicare Other | Source: Ambulatory Visit | Attending: Urology | Admitting: Urology

## 2021-01-03 ENCOUNTER — Other Ambulatory Visit: Payer: Self-pay

## 2021-01-03 DIAGNOSIS — N2889 Other specified disorders of kidney and ureter: Secondary | ICD-10-CM | POA: Insufficient documentation

## 2021-01-03 LAB — POCT I-STAT CREATININE: Creatinine, Ser: 1 mg/dL (ref 0.44–1.00)

## 2021-01-03 MED ORDER — IOHEXOL 300 MG/ML  SOLN
100.0000 mL | Freq: Once | INTRAMUSCULAR | Status: AC | PRN
  Administered 2021-01-03: 80 mL via INTRAVENOUS

## 2021-01-17 ENCOUNTER — Ambulatory Visit: Payer: Self-pay | Admitting: Urology

## 2021-01-17 NOTE — Progress Notes (Signed)
01/18/2021  2:36 PM   Alexa Pham [birth year] [id number]  Referring provider: Einar Pheasant, [address] [place],  [place] [id number] Chief Complaint  Patient presents with  . Follow-up    HPI: Alexa Pham is a 87 y.o. female who presents today for a 6 month follow up regarding CT abdomen with about without contrast results.  Incidentally diagnosed with probable left renal mass in 07/2020.  she underwent a CT of the abdomen with and without contrast on 07/19/2020 which indicated the presence of an exophytic mass in the posterior midportion of the left kidney measuring 3.4 x 2.9 cm.  There is also an adjacent retroperitoneal lymph node which was read as measuring 1.2 x 1.0 cm concerning for possible metastatic disease.  There are also some nonspecific lung findings which were further evaluated by pulmonology and not felt to represent metastatic disease.  She returns today with serial imaging after electing observation.  CT scan outlined below.  Patient denies any urinary symptoms.  No changes in her medical history.  She is feeling well today.  IMPRESSION of CT ABDOMEN AND PELVIS WITHOUT AND WITH CONTRAST on 01/03/2021: 1. Stable appearance of enhancing mass extending posteriorly from the lower pole of the left kidney, consistent with renal cell carcinoma. Small retroperitoneal lymph nodes are unchanged. No evidence of metastatic disease. 2. Stable bilateral renal cysts and mild pelvicaliectasis. 3. Small bladder diverticulum. 4. Aortic Atherosclerosis (ICD10-I70.0).   PMH: Past Medical History:  Diagnosis Date  . Actinic keratosis 01/28/2020   R thumb proximal nail fold   . Atypical mole 05/13/2018   R distal lat thigh  . Atypical mole 09/14/2014   left ant mid thigh  . Atypical mole 01/19/2014   left paraspinal mid to upper back  . Atypical mole 01/19/2014   right back mid to inf scapula  . Atypical mole 01/19/2014   left posterior deltoid  medial  . Basal cell carcinoma 01/28/2020   left of midline mid dorsum nose  . Basal cell carcinoma 01/06/2018   left preauricular   . Basal cell carcinoma 11/22/2016   left upper lip  . Basal cell carcinoma 03/17/2015   left lateral elbow  . Basal cell carcinoma 07/22/2014   R medial inferior knee  . Basal cell carcinoma 07/21/2020   Left dorsum hand. Nodular pattern. Excised 08/09/2020, margins free.  . Family history of breast cancer   . History of breast cancer   . History of pneumonia   . Hypertension   . Hypothyroidism   . Macular degeneration   . Squamous cell carcinoma of skin 01/06/2018   right dorsum hand at ring finger MCP  . Squamous cell carcinoma of skin 07/08/2017   left distal dorsum forearm     Surgical History: Past Surgical History:  Procedure Laterality Date  . ABDOMINAL HYSTERECTOMY    . ABDOMINAL HYSTERECTOMY    . APPENDECTOMY    . BREAST LUMPECTOMY    . CATARACT EXTRACTION, BILATERAL    . CHOLECYSTECTOMY    . CORONARY STENT INTERVENTION N/A 07/26/2020   Procedure: CORONARY STENT INTERVENTION;  Surgeon: Isaias Cowman, MD;  Location: [facility];  Service: Cardiovascular;  Laterality: N/A;  . LEFT HEART CATH AND CORONARY ANGIOGRAPHY Left 07/26/2020   Procedure: LEFT HEART CATH AND CORONARY ANGIOGRAPHY;  Surgeon: Corey Skains, MD;  Location: [facility];  Service: Cardiovascular;  Laterality: Left;  . REPLACEMENT TOTAL KNEE    . TONSILLECTOMY AND ADENOIDECTOMY  Home Medications:  Allergies as of 01/18/2021      Reactions   Ticagrelor Shortness Of Breath   Extreme       Medication List       Accurate as of January 18, 2021  2:36 PM. If you have any questions, ask your nurse or doctor.        STOP taking these medications   sulfamethoxazole-trimethoprim 800-160 MG tablet Commonly known as: BACTRIM DS Stopped by: Hollice Espy, MD     TAKE these medications   amLODipine 10 MG tablet Commonly known as:  NORVASC Take 1 tablet (10 mg total) by mouth daily.   aspirin 81 MG EC tablet Take 1 tablet (81 mg total) by mouth daily. Swallow whole.   atorvastatin 40 MG tablet Commonly known as: LIPITOR TAKE 1 TABLET(40 MG) BY MOUTH DAILY   calcium citrate-vitamin D 315-200 MG-UNIT tablet Commonly known as: CITRACAL+D Take 1 tablet by mouth 2 (two) times daily.   clopidogrel 75 MG tablet Commonly known as: PLAVIX Take 75 mg by mouth daily.   irbesartan 150 MG tablet Commonly known as: AVAPRO Take 150 mg by mouth daily.   levothyroxine 125 MCG tablet Commonly known as: SYNTHROID TAKE 1 TABLET BY MOUTH EVERY DAY BEFORE BREAKFAST   metoprolol succinate 50 MG 24 hr tablet Commonly known as: TOPROL-XL TAKE 1 TABLET(50 MG) BY MOUTH EVERY DAY WITH OR IMMEDIATELY FOLLOWING A MEAL   multivitamin capsule Take 1 capsule by mouth daily.   PRESERVISION AREDS 2 PO Take 1 tablet by mouth 2 (two) times daily.   Systane 0.4-0.3 % Soln Generic drug: Polyethyl Glycol-Propyl Glycol Place 1 drop into both eyes in the morning, at noon, and at bedtime.       Allergies: Allergies  Allergen Reactions  . Ticagrelor Shortness Of Breath    Extreme     Family History: Family History  Problem Relation Age of Onset  . CAD Mother   . Cancer Maternal Aunt        unk type  . Leukemia Maternal Grandmother   . Breast cancer Half-Sister        dx 2s    Social History:   reports that she quit smoking about 47 years ago. Her smoking use included cigarettes. She has a 9.00 pack-year smoking history. She has never used smokeless tobacco. She reports that she does not drink alcohol and does not use drugs.  ROS: Pertinent ROS in HPI.  Physical Exam: BP 116/63   Pulse 71   Ht 5\' 7"  (1.702 m)   Wt 113 lb (51.3 kg)   BMI 17.70 kg/m   Constitutional:  Alert and oriented, No acute distress. HEENT: Oval AT, moist mucus membranes.  Trachea midline, no masses. Cardiovascular: No clubbing, cyanosis, or  edema. Respiratory: Normal respiratory effort, no increased work of breathing. Skin: No rashes, bruises or suspicious lesions. Neurologic: Grossly intact, no focal deficits, moving all 4 extremities. Psychiatric: Normal mood and affect.  Laboratory Data:  Results for orders placed or performed during the hospital encounter of 01/03/21  I-STAT creatinine  Result Value Ref Range   Creatinine, Ser 1.00 0.44 - 1.00 mg/dL     Pertinent Imaging:  CLINICAL DATA:  Left renal mass. Surveillance of probable renal cell carcinoma.  EXAM: CT ABDOMEN AND PELVIS WITHOUT AND WITH CONTRAST  TECHNIQUE: Multidetector CT imaging of the abdomen and pelvis was performed following the standard protocol before and following the bolus administration of intravenous contrast.  CONTRAST:  38mL OMNIPAQUE IOHEXOL  300 MG/ML  SOLN  COMPARISON:  Abdominal CT 07/19/2020.  FINDINGS: Lower chest: Bronchiectasis and volume loss in the right middle lobe are similar to previous studies. The heart is mildly enlarged. No significant pleural or pericardial effusion.  Hepatobiliary: The liver is normal in density without suspicious focal abnormality. Stable mild biliary dilatation post cholecystectomy, likely physiologic.  Pancreas: Unremarkable. No pancreatic ductal dilatation or surrounding inflammatory changes.  Spleen: Normal in size without focal abnormality.  Adrenals/Urinary Tract: Both adrenal glands appear normal. Heterogeneously enhancing mass extending posteriorly from the lower pole of the left kidney is again noted, measuring 3.4 x 2.9 x 3.7 cm. This appears unchanged from the previous study. No other enhancing renal masses are identified. There are stable low-density renal lesions bilaterally which are likely cysts. There is stable mild pelvicaliectasis bilaterally and parapelvic cyst formation on the left. There is a bladder diverticulum posterolaterally on  the left.  Stomach/Bowel: No enteric contrast administered. The stomach appears unremarkable for its degree of distension. No evidence of bowel wall thickening, distention or surrounding inflammatory change. There are diverticular changes throughout the distal colon.  Vascular/Lymphatic: There are no enlarged abdominal or pelvic lymph nodes. Scattered small retroperitoneal lymph nodes are stable. There is aortic and branch vessel atherosclerosis without acute vascular findings. The renal veins and IVC appear normal.  Reproductive: Hysterectomy. Probable residual ovarian tissue bilaterally. No adnexal mass.  Other: No evidence of abdominal wall mass or hernia. No ascites.  Musculoskeletal: No acute or significant osseous findings. Stable mild multilevel lumbar spondylosis.  IMPRESSION: 1. Stable appearance of enhancing mass extending posteriorly from the lower pole of the left kidney, consistent with renal cell carcinoma. Small retroperitoneal lymph nodes are unchanged. No evidence of metastatic disease. 2. Stable bilateral renal cysts and mild pelvicaliectasis. 3. Small bladder diverticulum. 4. Aortic Atherosclerosis (ICD10-I70.0).   Electronically Signed   By: Richardean Sale M.D.   On: 01/04/2021 08:31   I have personally reviewed the images and agree with radiologist interpretation.     Assessment & Plan:   1. Left renal mass Probable left renal cell carcinoma with borderline adjacent lymph node  CT scan is unchanged from previous scan, 6 months ago which is reassuring with no more likely indolent malignancy  Given her age, comorbidities and recent unstable angina, along with essentially stable scan, would most strongly advocate for continued observation to which she and her family are agreeable.  We will increase interval of surveillance, 57-month follow-up with imaging.  Warning symptoms reviewed.  Follow Up:  Follow up in 6 months with another abd CT w  and wo contrast and continue surveillance  I, Ardyth Gal, am acting as a scribe for Dr. Hollice Espy.   I have reviewed the above documentation for accuracy and completeness, and I agree with the above.   Hollice Espy, MD     [facility] [address], [place], [place] [address] [phone]

## 2021-01-18 ENCOUNTER — Other Ambulatory Visit: Payer: Self-pay

## 2021-01-18 ENCOUNTER — Encounter: Payer: Self-pay | Admitting: Urology

## 2021-01-18 ENCOUNTER — Ambulatory Visit (INDEPENDENT_AMBULATORY_CARE_PROVIDER_SITE_OTHER): Payer: Medicare Other | Admitting: Urology

## 2021-01-18 VITALS — BP 116/63 | HR 71 | Ht 67.0 in | Wt 113.0 lb

## 2021-01-18 DIAGNOSIS — N2889 Other specified disorders of kidney and ureter: Secondary | ICD-10-CM

## 2021-01-18 DIAGNOSIS — I2510 Atherosclerotic heart disease of native coronary artery without angina pectoris: Secondary | ICD-10-CM | POA: Diagnosis not present

## 2021-02-18 ENCOUNTER — Other Ambulatory Visit: Payer: Self-pay | Admitting: Internal Medicine

## 2021-02-23 ENCOUNTER — Other Ambulatory Visit: Payer: Self-pay

## 2021-02-23 ENCOUNTER — Ambulatory Visit (INDEPENDENT_AMBULATORY_CARE_PROVIDER_SITE_OTHER): Payer: Medicare Other | Admitting: Dermatology

## 2021-02-23 DIAGNOSIS — C44319 Basal cell carcinoma of skin of other parts of face: Secondary | ICD-10-CM | POA: Diagnosis not present

## 2021-02-23 DIAGNOSIS — D485 Neoplasm of uncertain behavior of skin: Secondary | ICD-10-CM

## 2021-02-23 DIAGNOSIS — L820 Inflamed seborrheic keratosis: Secondary | ICD-10-CM

## 2021-02-23 DIAGNOSIS — C44612 Basal cell carcinoma of skin of right upper limb, including shoulder: Secondary | ICD-10-CM

## 2021-02-23 DIAGNOSIS — C44619 Basal cell carcinoma of skin of left upper limb, including shoulder: Secondary | ICD-10-CM | POA: Diagnosis not present

## 2021-02-23 DIAGNOSIS — L578 Other skin changes due to chronic exposure to nonionizing radiation: Secondary | ICD-10-CM

## 2021-02-23 DIAGNOSIS — D0461 Carcinoma in situ of skin of right upper limb, including shoulder: Secondary | ICD-10-CM

## 2021-02-23 DIAGNOSIS — C4491 Basal cell carcinoma of skin, unspecified: Secondary | ICD-10-CM | POA: Insufficient documentation

## 2021-02-23 DIAGNOSIS — L821 Other seborrheic keratosis: Secondary | ICD-10-CM

## 2021-02-23 DIAGNOSIS — I2510 Atherosclerotic heart disease of native coronary artery without angina pectoris: Secondary | ICD-10-CM

## 2021-02-23 NOTE — Progress Notes (Signed)
Follow-Up Visit   Subjective  Alexa Pham is a 87 y.o. female who presents for the following: Other (Spots of right hand, left forearm and chin that look suspicious).  The following portions of the chart were reviewed this encounter and updated as appropriate:   Tobacco  Allergies  Meds  Problems  Med Hx  Surg Hx  Fam Hx     Review of Systems:  No other skin or systemic complaints except as noted in HPI or Assessment and Plan.  Objective  Well appearing patient in no apparent distress; mood and affect are within normal limits.  A focused examination was performed including face, arms, hands, back. Relevant physical exam findings are noted in the Assessment and Plan.  Objective  Rigth dorsal hand: 1.5 cm crusted papule  Objective  Left Dorsal Hand: 1.2 cm crusted papule  Objective  Right chin: 0.6 cm crusted papule  Objective  Upper back: Erythematous keratotic or waxy stuck-on papule or plaque.    Assessment & Plan    Actinic Damage - chronic, secondary to cumulative UV radiation exposure/sun exposure over time - diffuse scaly erythematous macules with underlying dyspigmentation - Recommend daily broad spectrum sunscreen SPF 30+ to sun-exposed areas, reapply every 2 hours as needed.  - Recommend staying in the shade or wearing long sleeves, sun glasses (UVA+UVB protection) and wide brim hats (4-inch brim around the entire circumference of the hat). - Call for new or changing lesions.  Seborrheic Keratoses - Stuck-on, waxy, tan-brown papules and/or plaques  - Benign-appearing - Discussed benign etiology and prognosis. - Observe - Call for any changes  Neoplasm of uncertain behavior of skin (3) Rigth dorsal hand  Epidermal / dermal shaving  Lesion diameter (cm):  1.5 Informed consent: discussed and consent obtained   Timeout: patient name, date of birth, surgical site, and procedure verified   Procedure prep:  Patient was prepped and draped in usual  sterile fashion Prep type:  Isopropyl alcohol Anesthesia: the lesion was anesthetized in a standard fashion   Anesthetic:  1% lidocaine w/ epinephrine 1-100,000 buffered w/ 8.4% NaHCO3 Instrument used: flexible razor blade   Hemostasis achieved with: pressure, aluminum chloride and electrodesiccation   Outcome: patient tolerated procedure well   Post-procedure details: sterile dressing applied and wound care instructions given   Dressing type: bandage and petrolatum    Destruction of lesion Complexity: extensive   Destruction method: electrodesiccation and curettage   Informed consent: discussed and consent obtained   Timeout:  patient name, date of birth, surgical site, and procedure verified Procedure prep:  Patient was prepped and draped in usual sterile fashion Prep type:  Isopropyl alcohol Anesthesia: the lesion was anesthetized in a standard fashion   Anesthetic:  1% lidocaine w/ epinephrine 1-100,000 buffered w/ 8.4% NaHCO3 Curettage performed in three different directions: Yes   Electrodesiccation performed over the curetted area: Yes   Lesion length (cm):  1.5 Lesion width (cm):  1.5 Margin per side (cm):  0.2 Final wound size (cm):  1.9 Hemostasis achieved with:  pressure and aluminum chloride Outcome: patient tolerated procedure well with no complications   Post-procedure details: sterile dressing applied and wound care instructions given   Dressing type: bandage and petrolatum    Specimen 1 - Surgical pathology Differential Diagnosis: BCC vs SCC vs other  Check Margins: No 1.5 cm crusted papule EDC today  Left Dorsal Hand  Epidermal / dermal shaving  Lesion diameter (cm):  1.2 Informed consent: discussed and consent obtained   Timeout:  patient name, date of birth, surgical site, and procedure verified   Procedure prep:  Patient was prepped and draped in usual sterile fashion Prep type:  Isopropyl alcohol Anesthesia: the lesion was anesthetized in a standard  fashion   Anesthetic:  1% lidocaine w/ epinephrine 1-100,000 buffered w/ 8.4% NaHCO3 Instrument used: flexible razor blade   Hemostasis achieved with: pressure, aluminum chloride and electrodesiccation   Outcome: patient tolerated procedure well   Post-procedure details: sterile dressing applied and wound care instructions given   Dressing type: bandage and petrolatum    Destruction of lesion Complexity: extensive   Destruction method: electrodesiccation and curettage   Informed consent: discussed and consent obtained   Timeout:  patient name, date of birth, surgical site, and procedure verified Procedure prep:  Patient was prepped and draped in usual sterile fashion Prep type:  Isopropyl alcohol Anesthesia: the lesion was anesthetized in a standard fashion   Anesthetic:  1% lidocaine w/ epinephrine 1-100,000 buffered w/ 8.4% NaHCO3 Curettage performed in three different directions: Yes   Electrodesiccation performed over the curetted area: Yes   Lesion length (cm):  1.2 Lesion width (cm):  1.2 Margin per side (cm):  0.2 Final wound size (cm):  1.6 Hemostasis achieved with:  pressure and aluminum chloride Outcome: patient tolerated procedure well with no complications   Post-procedure details: sterile dressing applied and wound care instructions given   Dressing type: bandage and petrolatum    Specimen 2 - Surgical pathology Differential Diagnosis: BCC vs SCC vs other  Check Margins: No 1.2 cm crusted papule EDC today  Right chin  Epidermal / dermal shaving  Lesion diameter (cm):  0.6 Informed consent: discussed and consent obtained   Timeout: patient name, date of birth, surgical site, and procedure verified   Procedure prep:  Patient was prepped and draped in usual sterile fashion Prep type:  Isopropyl alcohol Anesthesia: the lesion was anesthetized in a standard fashion   Anesthetic:  1% lidocaine w/ epinephrine 1-100,000 buffered w/ 8.4% NaHCO3 Instrument used:  flexible razor blade   Hemostasis achieved with: pressure, aluminum chloride and electrodesiccation   Outcome: patient tolerated procedure well   Post-procedure details: sterile dressing applied and wound care instructions given   Dressing type: bandage and petrolatum    Destruction of lesion Complexity: extensive   Destruction method: electrodesiccation and curettage   Informed consent: discussed and consent obtained   Timeout:  patient name, date of birth, surgical site, and procedure verified Procedure prep:  Patient was prepped and draped in usual sterile fashion Prep type:  Isopropyl alcohol Anesthesia: the lesion was anesthetized in a standard fashion   Anesthetic:  1% lidocaine w/ epinephrine 1-100,000 buffered w/ 8.4% NaHCO3 Curettage performed in three different directions: Yes   Electrodesiccation performed over the curetted area: Yes   Lesion length (cm):  0.6 Lesion width (cm):  0.6 Margin per side (cm):  0.2 Final wound size (cm):  1 Hemostasis achieved with:  pressure and aluminum chloride Outcome: patient tolerated procedure well with no complications   Post-procedure details: sterile dressing applied and wound care instructions given   Dressing type: bandage and petrolatum    Specimen 3 - Surgical pathology Differential Diagnosis: BCC vs SCC vs other  Check Margins: No 0.6 cm crusted papule EDC today  Inflamed seborrheic keratosis Upper back  Destruction of lesion - Upper back Complexity: simple   Destruction method: cryotherapy   Informed consent: discussed and consent obtained   Timeout:  patient name, date of birth, surgical site,  and procedure verified Lesion destroyed using liquid nitrogen: Yes   Region frozen until ice ball extended beyond lesion: Yes   Outcome: patient tolerated procedure well with no complications   Post-procedure details: wound care instructions given    Return in about 4 months (around 06/26/2021) for Follow up.  I, Ashok Cordia,  CMA, am acting as scribe for Sarina Ser, MD .  Documentation: I have reviewed the above documentation for accuracy and completeness, and I agree with the above.  Sarina Ser, MD

## 2021-02-23 NOTE — Patient Instructions (Signed)
Wound Care Instructions  1. Cleanse wound gently with soap and water once a day then pat dry with clean gauze. Apply a thing coat of Petrolatum (petroleum jelly, "Vaseline") over the wound (unless you have an allergy to this). We recommend that you use a new, sterile tube of Vaseline. Do not pick or remove scabs. Do not remove the yellow or white "healing tissue" from the base of the wound.  2. Cover the wound with fresh, clean, nonstick gauze and secure with paper tape. You may use Band-Aids in place of gauze and tape if the would is small enough, but would recommend trimming much of the tape off as there is often too much. Sometimes Band-Aids can irritate the skin.  3. You should call the office for your biopsy report after 1 week if you have not already been contacted.  4. If you experience any problems, such as abnormal amounts of bleeding, swelling, significant bruising, significant pain, or evidence of infection, please call the office immediately.  5. FOR ADULT SURGERY PATIENTS: If you need something for pain relief you may take 1 extra strength Tylenol (acetaminophen) AND 2 Ibuprofen (200mg  each) together every 4 hours as needed for pain. (do not take these if you are allergic to them or if you have a reason you should not take them.) Typically, you may only need pain medication for 1 to 3 days.    Cryotherapy Aftercare  . Wash gently with soap and water everyday.   Marland Kitchen Apply Vaseline and Band-Aid daily until healed.   If you have any questions or concerns for your doctor, please call our main line at [phone] and press option 4 to reach your doctor's medical assistant. If no one answers, please leave a voicemail as directed and we will return your call as soon as possible. Messages left after 4 pm will be answered the following business day.   You may also send [place] a message via [place]. We typically respond to MyChart messages within 1-2 business days.  For prescription refills,  please ask your pharmacy to contact our office. Our fax number is [phone].  If you have an urgent issue when the clinic is closed that cannot wait until the next business day, you can page your doctor at the number below.    Please note that while we do our best to be available for urgent issues outside of office hours, we are not available 24/7.   If you have an urgent issue and are unable to reach [place], you may choose to seek medical care at your doctor's office, retail clinic, urgent care center, or emergency room.  If you have a medical emergency, please immediately call 911 or go to the emergency department.  Pager Numbers  - Dr. Nehemiah Massed: [phone]  - Dr. Laurence Ferrari: [phone]  - Dr. Nicole Kindred: [phone]  In the event of inclement weather, please call our main line at [phone] for an update on the status of any delays or closures.  Dermatology Medication Tips: Please keep the boxes that topical medications come in in order to help keep track of the instructions about where and how to use these. Pharmacies typically print the medication instructions only on the boxes and not directly on the medication tubes.   If your medication is too expensive, please contact our office at [phone] option 4 or send [place] a message through [facility].   We are unable to tell what your co-pay for medications will be in advance as this is different  depending on your insurance coverage. However, we may be able to find a substitute medication at lower cost or fill out paperwork to get insurance to cover a needed medication.   If a prior authorization is required to get your medication covered by your insurance company, please allow [place] 1-2 business days to complete this process.  Drug prices often vary depending on where the prescription is filled and some pharmacies may offer cheaper prices.  The website www.goodrx.com contains coupons for medications through different pharmacies. The prices  here do not account for what the cost may be with help from insurance (it may be cheaper with your insurance), but the website can give you the price if you did not use any insurance.  - You can print the associated coupon and take it with your prescription to the pharmacy.  - You may also stop by our office during regular business hours and pick up a GoodRx coupon card.  - If you need your prescription sent electronically to a different pharmacy, notify our office through [facility] or by phone at [phone] option 4.

## 2021-02-28 ENCOUNTER — Telehealth: Payer: Self-pay

## 2021-02-28 NOTE — Telephone Encounter (Signed)
LM on VM please return my call  

## 2021-02-28 NOTE — Telephone Encounter (Signed)
-----   Message from Ralene Bathe, MD sent at 02/28/2021  9:18 AM EDT ----- Diagnosis 1. 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 Skin , left dorsal hand BASAL CELL CARCINOMA, SUPERFICIAL, NODULAR AND INFILTRATIVE PATTERNS, BASE INVOLVED 3. Skin , right chin BASAL CELL CARCINOMA, NODULAR PATTERN, BASE INVOLVED  1, 2, 3 - all 3 are Cancer - BCC All 3 already treated Recheck next visit

## 2021-03-03 ENCOUNTER — Encounter: Payer: Self-pay | Admitting: Dermatology

## 2021-03-07 ENCOUNTER — Telehealth: Payer: Self-pay

## 2021-03-07 NOTE — Telephone Encounter (Signed)
Advised patient of results/hd  

## 2021-03-07 NOTE — Telephone Encounter (Signed)
-----   Message from Ralene Bathe, MD sent at 02/28/2021  9:18 AM EDT ----- Diagnosis 1. 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 Skin , left dorsal hand BASAL CELL CARCINOMA, SUPERFICIAL, NODULAR AND INFILTRATIVE PATTERNS, BASE INVOLVED 3. Skin , right chin BASAL CELL CARCINOMA, NODULAR PATTERN, BASE INVOLVED  1, 2, 3 - all 3 are Cancer - BCC All 3 already treated Recheck next visit

## 2021-03-25 ENCOUNTER — Other Ambulatory Visit: Payer: Self-pay | Admitting: Internal Medicine

## 2021-06-26 ENCOUNTER — Ambulatory Visit (INDEPENDENT_AMBULATORY_CARE_PROVIDER_SITE_OTHER): Payer: Medicare Other | Admitting: Dermatology

## 2021-06-26 ENCOUNTER — Other Ambulatory Visit: Payer: Self-pay

## 2021-06-26 DIAGNOSIS — L578 Other skin changes due to chronic exposure to nonionizing radiation: Secondary | ICD-10-CM

## 2021-06-26 DIAGNOSIS — L820 Inflamed seborrheic keratosis: Secondary | ICD-10-CM

## 2021-06-26 DIAGNOSIS — I2510 Atherosclerotic heart disease of native coronary artery without angina pectoris: Secondary | ICD-10-CM

## 2021-06-26 DIAGNOSIS — L570 Actinic keratosis: Secondary | ICD-10-CM

## 2021-06-26 NOTE — Patient Instructions (Addendum)

## 2021-06-26 NOTE — Progress Notes (Signed)
   Follow-Up Visit   Subjective  Alexa Pham is a 87 y.o. female who presents for the following: Skin Problem (Check several places on the right leg and face need to be checked today ).  The following portions of the chart were reviewed this encounter and updated as appropriate:   Tobacco  Allergies  Meds  Problems  Med Hx  Surg Hx  Fam Hx     Review of Systems:  No other skin or systemic complaints except as noted in HPI or Assessment and Plan.  Objective  Well appearing patient in no apparent distress; mood and affect are within normal limits.  A focused examination was performed including face,right leg. Relevant physical exam findings are noted in the Assessment and Plan.  right mid pretibial, face  (4) (4) Erythematous keratotic or waxy stuck-on papule or plaque.   face (17) Erythematous thin papules/macules with gritty scale.    Assessment & Plan  Inflamed seborrheic keratosis right mid pretibial, face  (4)  Recheck Right pretibial at the next office visit    Destruction of lesion - right mid pretibial, face  (4) Complexity: simple   Destruction method: cryotherapy   Informed consent: discussed and consent obtained   Timeout:  patient name, date of birth, surgical site, and procedure verified Lesion destroyed using liquid nitrogen: Yes   Region frozen until ice ball extended beyond lesion: Yes   Outcome: patient tolerated procedure well with no complications   Post-procedure details: wound care instructions given    AK (actinic keratosis) (17) face  Actinic keratoses are precancerous spots that appear secondary to cumulative UV radiation exposure/sun exposure over time. They are chronic with expected duration over 1 year. A portion of actinic keratoses will progress to squamous cell carcinoma of the skin. It is not possible to reliably predict which spots will progress to skin cancer and so treatment is recommended to prevent development of skin  cancer.  Recommend daily broad spectrum sunscreen SPF 30+ to sun-exposed areas, reapply every 2 hours as needed.  Recommend staying in the shade or wearing long sleeves, sun glasses (UVA+UVB protection) and wide brim hats (4-inch brim around the entire circumference of the hat). Call for new or changing lesions.   Destruction of lesion - face Complexity: simple   Destruction method: cryotherapy   Informed consent: discussed and consent obtained   Timeout:  patient name, date of birth, surgical site, and procedure verified Lesion destroyed using liquid nitrogen: Yes   Region frozen until ice ball extended beyond lesion: Yes   Outcome: patient tolerated procedure well with no complications   Post-procedure details: wound care instructions given    Actinic Damage - chronic, secondary to cumulative UV radiation exposure/sun exposure over time - diffuse scaly erythematous macules with underlying dyspigmentation - Recommend daily broad spectrum sunscreen SPF 30+ to sun-exposed areas, reapply every 2 hours as needed.  - Recommend staying in the shade or wearing long sleeves, sun glasses (UVA+UVB protection) and wide brim hats (4-inch brim around the entire circumference of the hat). - Call for new or changing lesions.  Return in about 6 weeks (around 08/07/2021) for Aks, ISKs.  IMarye Round, CMA, am acting as scribe for Sarina Ser, MD .  Documentation: I have reviewed the above documentation for accuracy and completeness, and I agree with the above.  Sarina Ser, MD

## 2021-06-27 ENCOUNTER — Other Ambulatory Visit: Payer: Self-pay | Admitting: Internal Medicine

## 2021-06-29 ENCOUNTER — Encounter: Payer: Self-pay | Admitting: Dermatology

## 2021-07-11 ENCOUNTER — Emergency Department: Payer: No Typology Code available for payment source

## 2021-07-11 ENCOUNTER — Emergency Department
Admission: EM | Admit: 2021-07-11 | Discharge: 2021-07-11 | Disposition: A | Discharge status: Home / Self Care | Payer: No Typology Code available for payment source | Attending: Emergency Medicine | Admitting: Emergency Medicine

## 2021-07-11 ENCOUNTER — Other Ambulatory Visit: Payer: Self-pay

## 2021-07-11 DIAGNOSIS — E039 Hypothyroidism, unspecified: Secondary | ICD-10-CM | POA: Diagnosis not present

## 2021-07-11 DIAGNOSIS — S63522A Sprain of radiocarpal joint of left wrist, initial encounter: Secondary | ICD-10-CM | POA: Insufficient documentation

## 2021-07-11 DIAGNOSIS — S63529A Sprain of radiocarpal joint of unspecified wrist, initial encounter: Secondary | ICD-10-CM

## 2021-07-11 DIAGNOSIS — Z96659 Presence of unspecified artificial knee joint: Secondary | ICD-10-CM | POA: Insufficient documentation

## 2021-07-11 DIAGNOSIS — Z7982 Long term (current) use of aspirin: Secondary | ICD-10-CM | POA: Insufficient documentation

## 2021-07-11 DIAGNOSIS — S298XXA Other specified injuries of thorax, initial encounter: Secondary | ICD-10-CM

## 2021-07-11 DIAGNOSIS — E119 Type 2 diabetes mellitus without complications: Secondary | ICD-10-CM | POA: Diagnosis not present

## 2021-07-11 DIAGNOSIS — Z87891 Personal history of nicotine dependence: Secondary | ICD-10-CM | POA: Diagnosis not present

## 2021-07-11 DIAGNOSIS — R52 Pain, unspecified: Secondary | ICD-10-CM

## 2021-07-11 DIAGNOSIS — S299XXA Unspecified injury of thorax, initial encounter: Secondary | ICD-10-CM | POA: Diagnosis not present

## 2021-07-11 DIAGNOSIS — Z853 Personal history of malignant neoplasm of breast: Secondary | ICD-10-CM | POA: Insufficient documentation

## 2021-07-11 DIAGNOSIS — I119 Hypertensive heart disease without heart failure: Secondary | ICD-10-CM | POA: Diagnosis not present

## 2021-07-11 DIAGNOSIS — R42 Dizziness and giddiness: Secondary | ICD-10-CM | POA: Diagnosis not present

## 2021-07-11 DIAGNOSIS — Z9010 Acquired absence of unspecified breast and nipple: Secondary | ICD-10-CM | POA: Insufficient documentation

## 2021-07-11 DIAGNOSIS — Z79899 Other long term (current) drug therapy: Secondary | ICD-10-CM | POA: Insufficient documentation

## 2021-07-11 DIAGNOSIS — I2510 Atherosclerotic heart disease of native coronary artery without angina pectoris: Secondary | ICD-10-CM | POA: Insufficient documentation

## 2021-07-11 DIAGNOSIS — Y92481 Parking lot as the place of occurrence of the external cause: Secondary | ICD-10-CM | POA: Diagnosis not present

## 2021-07-11 DIAGNOSIS — S6992XA Unspecified injury of left wrist, hand and finger(s), initial encounter: Secondary | ICD-10-CM | POA: Diagnosis present

## 2021-07-11 LAB — CBC WITH DIFFERENTIAL/PLATELET
Abs Immature Granulocytes: 0.06 10*3/uL (ref 0.00–0.07)
Basophils Absolute: 0.1 10*3/uL (ref 0.0–0.1)
Basophils Relative: 1 %
Eosinophils Absolute: 0 10*3/uL (ref 0.0–0.5)
Eosinophils Relative: 0 %
HCT: 43.1 % (ref 36.0–46.0)
Hemoglobin: 14.6 g/dL (ref 12.0–15.0)
Immature Granulocytes: 1 %
Lymphocytes Relative: 13 %
Lymphs Abs: 1.3 10*3/uL (ref 0.7–4.0)
MCH: 33.1 pg (ref 26.0–34.0)
MCHC: 33.9 g/dL (ref 30.0–36.0)
MCV: 97.7 fL (ref 80.0–100.0)
Monocytes Absolute: 0.8 10*3/uL (ref 0.1–1.0)
Monocytes Relative: 8 %
Neutro Abs: 8 10*3/uL — ABNORMAL HIGH (ref 1.7–7.7)
Neutrophils Relative %: 77 %
Platelets: 217 10*3/uL (ref 150–400)
RBC: 4.41 MIL/uL (ref 3.87–5.11)
RDW: 15.1 % (ref 11.5–15.5)
WBC: 10.3 10*3/uL (ref 4.0–10.5)
nRBC: 0 % (ref 0.0–0.2)

## 2021-07-11 LAB — URINALYSIS, COMPLETE (UACMP) WITH MICROSCOPIC
Bilirubin Urine: NEGATIVE
Glucose, UA: NEGATIVE mg/dL
Hgb urine dipstick: NEGATIVE
Ketones, ur: NEGATIVE mg/dL
Leukocytes,Ua: NEGATIVE
Nitrite: NEGATIVE
Protein, ur: NEGATIVE mg/dL
Specific Gravity, Urine: 1.009 (ref 1.005–1.030)
Squamous Epithelial / HPF: NONE SEEN (ref 0–5)
pH: 8 (ref 5.0–8.0)

## 2021-07-11 LAB — COMPREHENSIVE METABOLIC PANEL
ALT: 28 U/L (ref 0–44)
AST: 52 U/L — ABNORMAL HIGH (ref 15–41)
Albumin: 4 g/dL (ref 3.5–5.0)
Alkaline Phosphatase: 71 U/L (ref 38–126)
Anion gap: 8 (ref 5–15)
BUN: 20 mg/dL (ref 8–23)
CO2: 31 mmol/L (ref 22–32)
Calcium: 9.3 mg/dL (ref 8.9–10.3)
Chloride: 98 mmol/L (ref 98–111)
Creatinine, Ser: 0.84 mg/dL (ref 0.44–1.00)
GFR, Estimated: >60 mL/min (ref >60)
Glucose, Bld: 104 mg/dL — ABNORMAL HIGH (ref 70–99)
Potassium: 3.9 mmol/L (ref 3.5–5.1)
Sodium: 137 mmol/L (ref 135–145)
Total Bilirubin: 1.8 mg/dL — ABNORMAL HIGH (ref 0.3–1.2)
Total Protein: 6.6 g/dL (ref 6.5–8.1)

## 2021-07-11 LAB — TROPONIN I (HIGH SENSITIVITY): Troponin I (High Sensitivity): 9 ng/L (ref <18)
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
## 2021-07-11 NOTE — Discharge Instructions (Addendum)
Take Tylenol for pain as needed.  Return emergency department worsening.  Follow-up with your regular doctor if not improving in 1 week.
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
## 2021-07-18 ENCOUNTER — Other Ambulatory Visit: Payer: Self-pay | Admitting: Internal Medicine

## 2021-07-21 ENCOUNTER — Other Ambulatory Visit: Payer: Self-pay

## 2021-07-21 ENCOUNTER — Ambulatory Visit
Admission: RE | Admit: 2021-07-21 | Discharge: 2021-07-21 | Disposition: A | Discharge status: Home / Self Care | Payer: Medicare Other | Source: Ambulatory Visit | Attending: Urology | Admitting: Urology

## 2021-07-21 DIAGNOSIS — N2889 Other specified disorders of kidney and ureter: Secondary | ICD-10-CM | POA: Insufficient documentation

## 2021-07-21 MED ORDER — IOHEXOL 350 MG/ML SOLN
75.0000 mL | Freq: Once | INTRAVENOUS | Status: AC | PRN
  Administered 2021-07-21: 75 mL via INTRAVENOUS

## 2021-07-24 NOTE — Progress Notes (Incomplete)
07/24/21 11:44 AM   Alexa Pham Jun 01, 1930 [id number]  Referring provider:  Einar Pheasant, [address] [place],  [place] [address] No chief complaint on file.    HPI: Alexa Pham is a 87 y.o.female who has a personal history of left renal mass, who presents today for 6 month follow-up with CT.   She was incidentally diagnosed with left renal mass in 1-/2021/ On 07/19/2020 she underwent a CT of the abdomen that reavels the presence of an exophytic mass in the posterior midportion of the left kidney measuring 3.4 x 2.9 cm.  There is also an adjacent retroperitoneal lymph node which was read as measuring 1.2 x 1.0 cm concerning for possible metastatic disease.  07/21/2021 CT revealed stable appearance of the 3.7 cm heterogeneously enhancing left lower pole renal mass, consistent with renal cell carcinoma. Small retroperitoneal lymph nodes are unchanged. No evidence of metastatic disease. It also revealed Small bilateral renal cysts and mild pelvicaliectasis unchanged.    PMH: Past Medical History:  Diagnosis Date   Actinic keratosis 01/28/2020   R thumb proximal nail fold    Atypical mole 05/13/2018   R distal lat thigh   Atypical mole 09/14/2014   left ant mid thigh   Atypical mole 01/19/2014   left paraspinal mid to upper back   Atypical mole 01/19/2014   right back mid to inf scapula   Atypical mole 01/19/2014   left posterior deltoid medial   Basal cell carcinoma 01/28/2020   left of midline mid dorsum nose   Basal cell carcinoma 01/06/2018   left preauricular    Basal cell carcinoma 11/22/2016   left upper lip   Basal cell carcinoma 03/17/2015   left lateral elbow   Basal cell carcinoma 07/22/2014   R medial inferior knee   Basal cell carcinoma 07/21/2020   Left dorsum hand. Nodular pattern. Excised 08/09/2020, margins free.   Basal cell carcinoma 02/23/2021   right dorsal hand (BCC/SCC), left dorsal hand, right chin - treated with  EDC   Family history of breast cancer    History of breast cancer    History of pneumonia    Hypertension    Hypothyroidism    Macular degeneration    Squamous cell carcinoma of skin 01/06/2018   right dorsum hand at ring finger MCP   Squamous cell carcinoma of skin 07/08/2017   left distal dorsum forearm     Surgical History: Past Surgical History:  Procedure Laterality Date   ABDOMINAL HYSTERECTOMY     ABDOMINAL HYSTERECTOMY     APPENDECTOMY     BREAST LUMPECTOMY     CATARACT EXTRACTION, BILATERAL     CHOLECYSTECTOMY     CORONARY STENT INTERVENTION N/A 07/26/2020   Procedure: CORONARY STENT INTERVENTION;  Surgeon: Isaias Cowman, MD;  Location: [place] [facility];  Service: Cardiovascular;  Laterality: N/A;   LEFT HEART CATH AND CORONARY ANGIOGRAPHY Left 07/26/2020   Procedure: LEFT HEART CATH AND CORONARY ANGIOGRAPHY;  Surgeon: Corey Skains, MD;  Location: [place] [facility];  Service: Cardiovascular;  Laterality: Left;   REPLACEMENT TOTAL KNEE     TONSILLECTOMY AND ADENOIDECTOMY      Home Medications:  Allergies as of 07/25/2021       Reactions   Ticagrelor Shortness Of Breath   Extreme         Medication List        Accurate as of July 24, 2021 11:44 AM. If you have any questions, ask your  nurse or doctor.          amLODipine 10 MG tablet Commonly known as: NORVASC Take 1 tablet (10 mg total) by mouth daily.   aspirin 81 MG EC tablet Take 1 tablet (81 mg total) by mouth daily. Swallow whole.   atorvastatin 40 MG tablet Commonly known as: LIPITOR TAKE 1 TABLET(40 MG) BY MOUTH DAILY   calcium citrate-vitamin D 315-200 MG-UNIT tablet Commonly known as: CITRACAL+D Take 1 tablet by mouth 2 (two) times daily.   clopidogrel 75 MG tablet Commonly known as: PLAVIX Take 75 mg by mouth daily.   irbesartan 150 MG tablet Commonly known as: AVAPRO Take 150 mg by mouth daily.   levothyroxine 125 MCG tablet Commonly known as:  SYNTHROID TAKE 1 TABLET BY MOUTH EVERY DAY BEFORE BREAKFAST   metoprolol succinate 50 MG 24 hr tablet Commonly known as: TOPROL-XL TAKE 1 TABLET(50 MG) BY MOUTH EVERY DAY WITH OR IMMEDIATELY FOLLOWING A MEAL   metoprolol succinate 50 MG 24 hr tablet Commonly known as: TOPROL-XL TAKE 1 TABLET(50 MG) BY MOUTH EVERY DAY WITH OR IMMEDIATELY FOLLOWING A MEAL   multivitamin capsule Take 1 capsule by mouth daily.   PRESERVISION AREDS 2 PO Take 1 tablet by mouth 2 (two) times daily.   Systane 0.4-0.3 % Soln Generic drug: Polyethyl Glycol-Propyl Glycol Place 1 drop into both eyes in the morning, at noon, and at bedtime.        Allergies:  Allergies  Allergen Reactions   Ticagrelor Shortness Of Breath    Extreme     Family History: Family History  Problem Relation Age of Onset   CAD Mother    Cancer Maternal Aunt        unk type   Leukemia Maternal Grandmother    Breast cancer Half-Sister        dx 28s    Social History:  reports that she quit smoking about 47 years ago. Her smoking use included cigarettes. She has a 9.00 pack-year smoking history. She has never used smokeless tobacco. She reports that she does not drink alcohol and does not use drugs.   Physical Exam: There were no vitals taken for this visit.  Constitutional:  Alert and oriented, No acute distress. HEENT: [place] AT, moist mucus membranes.  Trachea midline, no masses. Cardiovascular: No clubbing, cyanosis, or edema. Respiratory: Normal respiratory effort, no increased work of breathing. Skin: No rashes, bruises or suspicious lesions. Neurologic: Grossly intact, no focal deficits, moving all 4 extremities. Psychiatric: Normal mood and affect.  Laboratory Data:  Lab Results  Component Value Date   CREATININE 0.84 07/11/2021    Lab Results  Component Value Date   HGBA1C 5.5 05/05/2020    Urinalysis   Pertinent Imaging: CLINICAL DATA:  Surveillance of urologic cancer. Six-month follow-up.    EXAM: CT ABDOMEN AND PELVIS WITHOUT AND WITH CONTRAST   TECHNIQUE: Multidetector CT imaging of the abdomen and pelvis was performed following the standard protocol before and following the bolus administration of intravenous contrast.   CONTRAST:  71mL OMNIPAQUE IOHEXOL 350 MG/ML SOLN   COMPARISON:  Multiple priors including most recent CT January 03, 2021   FINDINGS: Lower chest: Bronchiectasis and volume loss in the right middle lobe are similar prior studies. Tree in bud nodularity in the right lower lobe appears similar to prior and are likely infectious or inflammatory.   Hepatobiliary: No suspicious hepatic lesion. Gallbladder surgically absent. Stable mild biliary ductal dilation post cholecystectomy.   Pancreas: Within normal limits.  Spleen: Within normal limits.   Adrenals/Urinary Tract: Bilateral adrenal glands are unremarkable.   Heterogeneously enhancing exophytic left lower pole renal mass measures 3.4 x 2.8 x 3.7 cm, unchanged from prior. No additional solid enhancing renal masses. Stable low-density bilateral renal lesions which are likely cysts. Left peripelvic cysts. Stable mild bilateral pelviectasis.   Bladder diverticulum extending posterolaterally.   Stomach/Bowel: Stomach is unremarkable for degree of distension. No pathologic dilation of small or large bowel. Colonic diverticulosis without findings of acute diverticulitis.   Vascular/Lymphatic: No pathologically enlarged abdominal or pelvic lymph nodes visualized. Scattered small retroperitoneal lymph nodes are again visualized and appear unchanged.   Aortic atherosclerosis.  The renal veins and IVC appear normal.   Reproductive: Hysterectomy.  No suspicious adnexal masses.   Other: No abdominopelvic ascites.   Musculoskeletal: Multilevel degenerative changes spine. No aggressive lytic or blastic lesion of bone identified.   IMPRESSION: 1. Stable appearance of the 3.7 cm heterogeneously  enhancing left lower pole renal mass, consistent with renal cell carcinoma. Small retroperitoneal lymph nodes are unchanged. No evidence of metastatic disease. 2. Small bilateral renal cysts and mild pelvicaliectasis unchanged. 3. Small bladder diverticulum. 4. Colonic diverticulosis without findings of acute diverticulitis. 5. Aortic Atherosclerosis (ICD10-I70.0).     Electronically Signed   By: Dahlia Bailiff M.D.   On: 07/22/2021 16:41   Assessment & Plan:     No follow-ups on file.  I,Kailey Littlejohn,acting as a Education administrator for Hollice Espy, MD.,have documented all relevant documentation on the behalf of Hollice Espy, MD,as directed by  Hollice Espy, MD while in the presence of Hollice Espy, [place] [address], [place], [place] [address] [phone]

## 2021-07-25 ENCOUNTER — Ambulatory Visit: Payer: Medicare Other | Admitting: Urology

## 2021-07-25 ENCOUNTER — Ambulatory Visit (INDEPENDENT_AMBULATORY_CARE_PROVIDER_SITE_OTHER): Payer: Medicare Other | Admitting: Urology

## 2021-07-25 ENCOUNTER — Other Ambulatory Visit: Payer: Self-pay

## 2021-07-25 ENCOUNTER — Encounter: Payer: Self-pay | Admitting: Urology

## 2021-07-25 VITALS — BP 169/74 | HR 74 | Ht 66.0 in | Wt 108.0 lb

## 2021-07-25 DIAGNOSIS — N2889 Other specified disorders of kidney and ureter: Secondary | ICD-10-CM

## 2021-07-25 DIAGNOSIS — I2510 Atherosclerotic heart disease of native coronary artery without angina pectoris: Secondary | ICD-10-CM | POA: Diagnosis not present

## 2021-07-25 MED ORDER — GEMTESA 75 MG PO TABS: 75.0000 mg | ORAL_TABLET | Freq: Every day | ORAL | 0 refills | Status: DC
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
## 2021-08-07 ENCOUNTER — Ambulatory Visit (INDEPENDENT_AMBULATORY_CARE_PROVIDER_SITE_OTHER): Payer: Medicare Other | Admitting: Dermatology

## 2021-08-07 ENCOUNTER — Other Ambulatory Visit: Payer: Self-pay

## 2021-08-07 ENCOUNTER — Encounter: Payer: Self-pay | Admitting: Dermatology

## 2021-08-07 DIAGNOSIS — L578 Other skin changes due to chronic exposure to nonionizing radiation: Secondary | ICD-10-CM

## 2021-08-07 DIAGNOSIS — L820 Inflamed seborrheic keratosis: Secondary | ICD-10-CM | POA: Diagnosis not present

## 2021-08-07 DIAGNOSIS — I2510 Atherosclerotic heart disease of native coronary artery without angina pectoris: Secondary | ICD-10-CM | POA: Diagnosis not present

## 2021-08-07 DIAGNOSIS — L821 Other seborrheic keratosis: Secondary | ICD-10-CM

## 2021-08-07 NOTE — Patient Instructions (Addendum)

## 2021-08-07 NOTE — Progress Notes (Signed)
   Follow-Up Visit   Subjective  Alexa Pham is a 87 y.o. female who presents for the following: Follow-up (Patient reports some new spots at left eye and right arm. Patient here today for isk and ak follow up. ). She has several spots to be checked today.  The following portions of the chart were reviewed this encounter and updated as appropriate:  Tobacco  Allergies  Meds  Problems  Med Hx  Surg Hx  Fam Hx     Review of Systems: No other skin or systemic complaints except as noted in HPI or Assessment and Plan.  Objective  Well appearing patient in no apparent distress; mood and affect are within normal limits.  A focused examination was performed including face and upper extremities, including the arms, hands, fingers, and fingernails. Relevant physical exam findings are noted in the Assessment and Plan.  Left Temple x 1, right bicep x 2 (3) Erythematous keratotic or waxy stuck-on papule or plaque.   Assessment & Plan  Inflamed seborrheic keratosis Left Temple x 1, right bicep x 2  Destruction of lesion - Left Temple x 1, right bicep x 2 Complexity: simple   Destruction method: cryotherapy   Informed consent: discussed and consent obtained   Timeout:  patient name, date of birth, surgical site, and procedure verified Lesion destroyed using liquid nitrogen: Yes   Region frozen until ice ball extended beyond lesion: Yes   Outcome: patient tolerated procedure well with no complications   Post-procedure details: wound care instructions given    Seborrheic Keratoses - Stuck-on, waxy, tan-brown papules and/or plaques  - Benign-appearing - Discussed benign etiology and prognosis. - Observe - Call for any changes  Actinic Damage - chronic, secondary to cumulative UV radiation exposure/sun exposure over time - diffuse scaly erythematous macules with underlying dyspigmentation - Recommend daily broad spectrum sunscreen SPF 30+ to sun-exposed areas, reapply every 2 hours as  needed.  - Recommend staying in the shade or wearing long sleeves, sun glasses (UVA+UVB protection) and wide brim hats (4-inch brim around the entire circumference of the hat). - Call for new or changing lesions.  Return for 4 month ak and isk follow up . IRuthell Rummage, CMA, am acting as scribe for Sarina Ser, MD. Documentation: I have reviewed the above documentation for accuracy and completeness, and I agree with the above.  Sarina Ser, MD

## 2021-08-08 ENCOUNTER — Other Ambulatory Visit: Payer: Self-pay | Admitting: Internal Medicine

## 2021-08-14 ENCOUNTER — Telehealth: Payer: Self-pay | Admitting: Urology

## 2021-08-14 MED ORDER — GEMTESA 75 MG PO TABS: 75.0000 mg | ORAL_TABLET | Freq: Every day | ORAL | 11 refills | Status: DC

## 2021-08-14 NOTE — Telephone Encounter (Signed)
Sent medication to pharmacy. Will wait to see if PA is needed.

## 2021-08-14 NOTE — Telephone Encounter (Signed)
Patient called the office today to report that the [place] samples are working wonderfully.  She does not have to wear a depends anymore.  She is thrilled with the success.   She contacted her pharmacy and they told her the cost of the medication.  She is concerned about the price.  The pharmacy told her that the office has ways to help her with the cost.   Can we send in a prescription for [facility] for this patient?  Are there cost saving options for her?

## 2021-08-18 NOTE — Telephone Encounter (Signed)
PA approved. [id number]. GEMTESA TAB 75MG  is approved through 10/07/2022. Pt aware will call pharmacy.

## 2021-08-18 NOTE — Telephone Encounter (Signed)
PA pending via cover my meds. KEY : [id number]

## 2021-08-23 ENCOUNTER — Ambulatory Visit: Payer: Medicare Other

## 2021-09-26 ENCOUNTER — Other Ambulatory Visit: Payer: Self-pay | Admitting: Internal Medicine

## 2021-10-10 ENCOUNTER — Ambulatory Visit (INDEPENDENT_AMBULATORY_CARE_PROVIDER_SITE_OTHER): Payer: Medicare Other

## 2021-10-10 VITALS — Ht 66.0 in | Wt 108.0 lb

## 2021-10-10 DIAGNOSIS — Z0000 Encounter for general adult medical examination without abnormal findings: Secondary | ICD-10-CM

## 2021-10-10 DIAGNOSIS — Z780 Asymptomatic menopausal state: Secondary | ICD-10-CM | POA: Diagnosis not present

## 2021-10-10 NOTE — Patient Instructions (Addendum)
Alexa Pham , Thank you for taking time to come for your Medicare Wellness Visit. I appreciate your ongoing commitment to your health goals. Please review the following plan we discussed and let me know if I can assist you in the future.   These are the goals we discussed:  Goals       Patient Stated     Maintain Healthy Lifestyle (pt-stated)      Healthy diet Stay active; walk for exercise        This is a list of the screening recommended for you and due dates:  Health Maintenance  Topic Date Due   COVID-19 Vaccine (6 - Booster) 10/26/2021*   Zoster (Shingles) Vaccine (1 of 2) 01/08/2022*   Tetanus Vaccine  11/09/2024   Pneumonia Vaccine  Completed   Flu Shot  Completed   DEXA scan (bone density measurement)  Completed   HPV Vaccine  Aged Out  *Topic was postponed. The date shown is not the original due date.   Bone Density: ordered per patient request. Facility to call patient with scheduling.   Advanced directives: End of life planning; Advance aging; Advanced directives discussed.  Copy of current HCPOA/Living Will requested.    Conditions/risks identified: none new  Next appointment: Follow up in one year for your annual wellness visit   Preventive Care 65 Years and Older, Female Preventive care refers to lifestyle choices and visits with your health care provider that can promote health and wellness. What does preventive care include? A yearly physical exam. This is also called an annual well check. Dental exams once or twice a year. Routine eye exams. Ask your health care provider how often you should have your eyes checked. Personal lifestyle choices, including: Daily care of your teeth and gums. Regular physical activity. Eating a healthy diet. Avoiding tobacco and drug use. Limiting alcohol use. Practicing safe sex. Taking low-dose aspirin every day. Taking vitamin and mineral supplements as recommended by your health care provider. What happens during  an annual well check? The services and screenings done by your health care provider during your annual well check will depend on your age, overall health, lifestyle risk factors, and family history of disease. Counseling  Your health care provider may ask you questions about your: Alcohol use. Tobacco use. Drug use. Emotional well-being. Home and relationship well-being. Sexual activity. Eating habits. History of falls. Memory and ability to understand (cognition). Work and work Statistician. Reproductive health. Screening  You may have the following tests or measurements: Height, weight, and BMI. Blood pressure. Lipid and cholesterol levels. These may be checked every 5 years, or more frequently if you are over 44 years old. Skin check. Lung cancer screening. You may have this screening every year starting at age 32 if you have a 30-pack-year history of smoking and currently smoke or have quit within the past 15 years. Fecal occult blood test (FOBT) of the stool. You may have this test every year starting at age 76. Flexible sigmoidoscopy or colonoscopy. You may have a sigmoidoscopy every 5 years or a colonoscopy every 10 years starting at age 32. Hepatitis C blood test. Hepatitis B blood test. Sexually transmitted disease (STD) testing. Diabetes screening. This is done by checking your blood sugar (glucose) after you have not eaten for a while (fasting). You may have this done every 1-3 years. Bone density scan. This is done to screen for osteoporosis. You may have this done starting at age 6. Mammogram. This may be done every  1-2 years. Talk to your health care provider about how often you should have regular mammograms. Talk with your health care provider about your test results, treatment options, and if necessary, the need for more tests. Vaccines  Your health care provider may recommend certain vaccines, such as: Influenza vaccine. This is recommended every year. Tetanus,  diphtheria, and acellular pertussis (Tdap, Td) vaccine. You may need a Td booster every 10 years. Zoster vaccine. You may need this after age 26. Pneumococcal 13-valent conjugate (PCV13) vaccine. One dose is recommended after age 23. Pneumococcal polysaccharide (PPSV23) vaccine. One dose is recommended after age 1. Talk to your health care provider about which screenings and vaccines you need and how often you need them. This information is not intended to replace advice given to you by your health care provider. Make sure you discuss any questions you have with your health care provider. Document Released: 10/21/2015 Document Revised: 06/13/2016 Document Reviewed: 07/26/2015 Elsevier Interactive Patient Education  2017 [place] Prevention in the Home Falls can cause injuries. They can happen to people of all ages. There are many things you can do to make your home safe and to help prevent falls. What can I do on the outside of my home? Regularly fix the edges of walkways and driveways and fix any cracks. Remove anything that might make you trip as you walk through a door, such as a raised step or threshold. Trim any bushes or trees on the path to your home. Use bright outdoor lighting. Clear any walking paths of anything that might make someone trip, such as rocks or tools. Regularly check to see if handrails are loose or broken. Make sure that both sides of any steps have handrails. Any raised decks and porches should have guardrails on the edges. Have any leaves, snow, or ice cleared regularly. Use sand or salt on walking paths during winter. Clean up any spills in your garage right away. This includes oil or grease spills. What can I do in the bathroom? Use night lights. Install grab bars by the toilet and in the tub and shower. Do not use towel bars as grab bars. Use non-skid mats or decals in the tub or shower. If you need to sit down in the shower, use a plastic,  non-slip stool. Keep the floor dry. Clean up any water that spills on the floor as soon as it happens. Remove soap buildup in the tub or shower regularly. Attach bath mats securely with double-sided non-slip rug tape. Do not have throw rugs and other things on the floor that can make you trip. What can I do in the bedroom? Use night lights. Make sure that you have a light by your bed that is easy to reach. Do not use any sheets or blankets that are too big for your bed. They should not hang down onto the floor. Have a firm chair that has side arms. You can use this for support while you get dressed. Do not have throw rugs and other things on the floor that can make you trip. What can I do in the kitchen? Clean up any spills right away. Avoid walking on wet floors. Keep items that you use a lot in easy-to-reach places. If you need to reach something above you, use a strong step stool that has a grab bar. Keep electrical cords out of the way. Do not use floor polish or wax that makes floors slippery. If you must use wax, use non-skid  floor wax. Do not have throw rugs and other things on the floor that can make you trip. What can I do with my stairs? Do not leave any items on the stairs. Make sure that there are handrails on both sides of the stairs and use them. Fix handrails that are broken or loose. Make sure that handrails are as long as the stairways. Check any carpeting to make sure that it is firmly attached to the stairs. Fix any carpet that is loose or worn. Avoid having throw rugs at the top or bottom of the stairs. If you do have throw rugs, attach them to the floor with carpet tape. Make sure that you have a light switch at the top of the stairs and the bottom of the stairs. If you do not have them, ask someone to add them for you. What else can I do to help prevent falls? Wear shoes that: Do not have high heels. Have rubber bottoms. Are comfortable and fit you well. Are closed  at the toe. Do not wear sandals. If you use a stepladder: Make sure that it is fully opened. Do not climb a closed stepladder. Make sure that both sides of the stepladder are locked into place. Ask someone to hold it for you, if possible. Clearly mark and make sure that you can see: Any grab bars or handrails. First and last steps. Where the edge of each step is. Use tools that help you move around (mobility aids) if they are needed. These include: Canes. Walkers. Scooters. Crutches. Turn on the lights when you go into a dark area. Replace any light bulbs as soon as they burn out. Set up your furniture so you have a clear path. Avoid moving your furniture around. If any of your floors are uneven, fix them. If there are any pets around you, be aware of where they are. Review your medicines with your doctor. Some medicines can make you feel dizzy. This can increase your chance of falling. Ask your doctor what other things that you can do to help prevent falls. This information is not intended to replace advice given to you by your health care provider. Make sure you discuss any questions you have with your health care provider. Document Released: 07/21/2009 Document Revised: 03/01/2016 Document Reviewed: 10/29/2014 Elsevier Interactive Patient Education  2017 [employer]        Bone Density Test A bone density test uses a type of X-ray to measure the amount of calcium and other minerals in a person's bones. It can measure bone density in the hip and the spine. The test is similar to having a regular X-ray. This test may also be called: Bone densitometry. Bone mineral density test. Dual-energy X-ray absorptiometry (DEXA). You may have this test to: Diagnose a condition that causes weak or thin bones (osteoporosis). Screen you for osteoporosis. Predict your risk for a broken bone (fracture). Determine how well your osteoporosis treatment is working. Tell a health care provider  about: Any allergies you have. All medicines you are taking, including vitamins, herbs, eye drops, creams, and over-the-counter medicines. Any problems you or family members have had with anesthetic medicines. Any blood disorders you have. Any surgeries you have had. Any medical conditions you have. Whether you are pregnant or may be pregnant. Any medical tests you have had within the past 14 days that used contrast material. What are the risks? Generally, this is a safe test. However, it does expose you to a small amount  of radiation, which can slightly increase your cancer risk. What happens before the test? Do not take any calcium supplements within the 24 hours before your test. You will need to remove all metal jewelry, eyeglasses, removable dental appliances, and any other metal objects on your body. What happens during the test?  You will lie down on an exam table. There will be an X-ray generator below you and an imaging device above you. Other devices, such as boxes or braces, may be used to position your body properly for the scan. The machine will slowly scan your body. You will need to keep very still while the machine does the scan. The images will show up on a screen in the room. Images will be examined by a specialist after your test is finished. The procedure may vary among health care providers and hospitals. What can I expect after the test? It is up to you to get the results of your test. Ask your health care provider, or the department that is doing the test, when your results will be ready. Summary A bone density test is an imaging test that uses a type of X-ray to measure the amount of calcium and other minerals in your bones. The test may be used to diagnose or screen you for a condition that causes weak or thin bones (osteoporosis), predict your risk for a broken bone (fracture), or determine how well your osteoporosis treatment is working. Do not take any calcium  supplements within 24 hours before your test. Ask your health care provider, or the department that is doing the test, when your results will be ready. This information is not intended to replace advice given to you by your health care provider. Make sure you discuss any questions you have with your health care provider. Document Revised: 06/07/2021 Document Reviewed: 03/10/2020 Elsevier Patient Education  [place].

## 2021-10-10 NOTE — Progress Notes (Signed)
Subjective:   Alexa Pham is a 88 y.o. female who presents for Medicare Annual (Subsequent) preventive examination.  Review of Systems    No ROS.  Medicare Wellness Virtual Visit.  Visual/audio telehealth visit, UTA vital signs.   See social history for additional risk factors.   Cardiac Risk Factors include: advanced age (>77men, >64 women);hypertension     Objective:    Today's Vitals   10/10/21 0902  Weight: 108 lb (49 kg)  Height: 5\' 6"  (1.676 m)   Body mass index is 17.43 kg/m.  Advanced Directives 10/10/2021 07/11/2021 08/22/2020 08/08/2020 05/04/2020 05/04/2020 08/20/2019  Does Patient Have a Medical Advance Directive? Yes Yes Yes Yes Yes No Yes  Type of Paramedic of [place];Living will Healthcare Power of Attorney Living will Living will;Healthcare Power of [place];Living will - [place];Living will  Does patient want to make changes to medical advance directive? No - Patient declined - - - No - Patient declined - No - Patient declined  Copy of [place] in Chart? No - copy requested - - - - - -  Would patient like information on creating a medical advance directive? - - - - No - Patient declined No - Patient declined -   Current Medications (verified) Outpatient Encounter Medications as of 10/10/2021  Medication Sig   amLODipine (NORVASC) 10 MG tablet TAKE 1 TABLET(10 MG) BY MOUTH DAILY   aspirin EC 81 MG EC tablet Take 1 tablet (81 mg total) by mouth daily. Swallow whole.   atorvastatin (LIPITOR) 40 MG tablet TAKE 1 TABLET(40 MG) BY MOUTH DAILY   calcium citrate-vitamin D (CITRACAL+D) 315-200 MG-UNIT tablet Take 1 tablet by mouth 2 (two) times daily.   clopidogrel (PLAVIX) 75 MG tablet Take 75 mg by mouth daily.   irbesartan (AVAPRO) 150 MG tablet Take 150 mg by mouth daily.   levothyroxine (SYNTHROID) 125 MCG tablet TAKE 1 TABLET BY MOUTH EVERY DAY BEFORE BREAKFAST   metoprolol  succinate (TOPROL-XL) 50 MG 24 hr tablet TAKE 1 TABLET(50 MG) BY MOUTH EVERY DAY WITH OR IMMEDIATELY FOLLOWING A MEAL   Multiple Vitamin (MULTIVITAMIN) capsule Take 1 capsule by mouth daily.   Multiple Vitamins-Minerals (PRESERVISION AREDS 2 PO) Take 1 tablet by mouth 2 (two) times daily.    Polyethyl Glycol-Propyl Glycol (SYSTANE) 0.4-0.3 % SOLN Place 1 drop into both eyes in the morning, at noon, and at bedtime.   Vibegron (GEMTESA) 75 MG TABS Take 75 mg by mouth daily.   No facility-administered encounter medications on file as of 10/10/2021.   Allergies (verified) Ticagrelor   History: Past Medical History:  Diagnosis Date   Actinic keratosis 01/28/2020   R thumb proximal nail fold    Atypical mole 05/13/2018   R distal lat thigh   Atypical mole 09/14/2014   left ant mid thigh   Atypical mole 01/19/2014   left paraspinal mid to upper back   Atypical mole 01/19/2014   right back mid to inf scapula   Atypical mole 01/19/2014   left posterior deltoid medial   Basal cell carcinoma 01/28/2020   left of midline mid dorsum nose   Basal cell carcinoma 01/06/2018   left preauricular    Basal cell carcinoma 11/22/2016   left upper lip   Basal cell carcinoma 03/17/2015   left lateral elbow   Basal cell carcinoma 07/22/2014   R medial inferior knee   Basal cell carcinoma 07/21/2020   Left dorsum hand.  Nodular pattern. Excised 08/09/2020, margins free.   Basal cell carcinoma 02/23/2021   right dorsal hand (BCC/SCC), left dorsal hand, right chin - treated with EDC   Family history of breast cancer    History of breast cancer    History of pneumonia    Hypertension    Hypothyroidism    Macular degeneration    Squamous cell carcinoma of skin 01/06/2018   right dorsum hand at ring finger MCP   Squamous cell carcinoma of skin 07/08/2017   left distal dorsum forearm    Past Surgical History:  Procedure Laterality Date   ABDOMINAL HYSTERECTOMY     ABDOMINAL HYSTERECTOMY      APPENDECTOMY     BREAST LUMPECTOMY     CATARACT EXTRACTION, BILATERAL     CHOLECYSTECTOMY     CORONARY STENT INTERVENTION N/A 07/26/2020   Procedure: CORONARY STENT INTERVENTION;  Surgeon: Isaias Cowman, MD;  Location: [facility];  Service: Cardiovascular;  Laterality: N/A;   LEFT HEART CATH AND CORONARY ANGIOGRAPHY Left 07/26/2020   Procedure: LEFT HEART CATH AND CORONARY ANGIOGRAPHY;  Surgeon: Corey Skains, MD;  Location: [facility];  Service: Cardiovascular;  Laterality: Left;   REPLACEMENT TOTAL KNEE     TONSILLECTOMY AND ADENOIDECTOMY     Family History  Problem Relation Age of Onset   CAD Mother    Cancer Maternal Aunt        unk type   Leukemia Maternal Grandmother    Breast cancer Half-Sister        dx 45s   Social History   Socioeconomic History   Marital status: Widowed    Spouse name: Not on file   Number of children: Not on file   Years of education: Not on file   Highest education level: Not on file  Occupational History   Not on file  Tobacco Use   Smoking status: Former    Packs/day: 1.50    Years: 6.00    Pack years: 9.00    Types: Cigarettes    Quit date: 25    Years since quitting: 48.0   Smokeless tobacco: Never  Substance and Sexual Activity   Alcohol use: No    Alcohol/week: 0.0 standard drinks   Drug use: No   Sexual activity: Not on file  Other Topics Concern   Not on file  Social History Narrative   Married    Social Determinants of Health   Financial Resource Strain: Low Risk    Difficulty of Paying Living Expenses: Not hard at all  Food Insecurity: No Food Insecurity   Worried About Charity fundraiser in the Last Year: Never true   Arboriculturist in the Last Year: Never true  Transportation Needs: No Transportation Needs   Lack of Transportation (Medical): No   Lack of Transportation (Non-Medical): No  Physical Activity: Not on file  Stress: No Stress Concern Present   Feeling of Stress : Not at  all  Social Connections: Moderately Integrated   Frequency of Communication with Friends and Family: More than three times a week   Frequency of Social Gatherings with Friends and Family: More than three times a week   Attends Religious Services: More than 4 times per year   Active Member of [employer] or Organizations: Yes   Attends Archivist Meetings: More than 4 times per year   Marital Status: Widowed   Tobacco Counseling Counseling given: Not Answered   Clinical Intake:  Pre-visit preparation completed: Yes        Diabetes: No  How often do you need to have someone help you when you read instructions, pamphlets, or other written materials from your doctor or pharmacy?: 1 - Never  Interpreter Needed?: No     Activities of Daily Living In your present state of health, do you have any difficulty performing the following activities: 10/10/2021  Hearing? Y  Comment Hearing aids  Vision? N  Difficulty concentrating or making decisions? N  Walking or climbing stairs? N  Dressing or bathing? N  Doing errands, shopping? N  Preparing Food and eating ? N  Using the Toilet? N  In the past six months, have you accidently leaked urine? N  Comment Followed by Urology every 6 months  Do you have problems with loss of bowel control? N  Managing your Medications? N  Managing your Finances? N  Housekeeping or managing your Housekeeping? N  Some recent data might be hidden   Patient Care Team: Einar Pheasant, MD as PCP - General (Internal Medicine)  Indicate any recent Medical Services you may have received from other than [facility] providers in the past year (date may be approximate).     Assessment:   This is a routine wellness examination for Alexa Pham.  Virtual Visit via Telephone Note  I connected with  Alexa Pham on 10/10/21 at  9:00 AM EST by telephone and verified that I am speaking with the correct person using two identifiers.  Persons participating in the virtual  visit: patient/Nurse Health Advisor   I discussed the limitations, risks, security and privacy concerns of performing an evaluation and management service by telephone and the availability of in person appointments. The patient expressed understanding and agreed to proceed.  Interactive audio and video telecommunications were attempted between this nurse and patient, however failed, due to patient having technical difficulties OR patient did not have access to video capability.  We continued and completed visit with audio only.  Some vital signs may be absent or patient reported.   Hearing/Vision screen Hearing Screening - Comments:: Hearing aids Vision Screening - Comments:: Wears corrective lenses  Macular- age related; drops in use Visits every 6 months  Dietary issues and exercise activities discussed: Current Exercise Habits: The patient does not participate in regular exercise at present Regular diet Good water intake   Goals Addressed               This Visit's Progress     Patient Stated     Maintain Healthy Lifestyle (pt-stated)        Healthy diet Stay active; walk for exercise       Depression Screen PHQ 2/9 Scores 10/10/2021 08/22/2020 08/20/2019 07/07/2019  PHQ - 2 Score 0 0 0 0    Fall Risk Fall Risk  10/10/2021 08/22/2020 08/20/2019 07/07/2019  Falls in the past year? 0 0 0 0  Number falls in past yr: 0 0 - -  Risk for fall due to : - - - Impaired vision  Follow up Falls evaluation completed Falls evaluation completed - Falls evaluation completed    [place]: Home free of loose throw rugs in walkways, pet beds, electrical cords, etc? Yes  Adequate lighting in your home to reduce risk of falls? Yes   ASSISTIVE DEVICES UTILIZED TO PREVENT FALLS: Life alert? No  Use of a cane, walker or w/c? No  Grab bars in the bathroom? Yes   TIMED  UP AND GO: Was the test performed? No .   Cognitive Function:  Patient is alert and  oriented x3.    6CIT Screen 10/10/2021 08/22/2020 08/20/2019  What Year? 0 points 0 points 0 points  What month? 0 points 0 points 0 points  What time? 0 points 0 points 0 points  Count back from 20 0 points 0 points 0 points  Months in reverse 0 points 0 points 0 points  Repeat phrase 0 points 0 points 0 points  Total Score 0 0 0   Immunizations Immunization History  Administered Date(s) Administered   Fluad Quad(high Dose 65+) 06/14/2019, 06/10/2020, 09/21/2021   Influenza, High Dose Seasonal PF 07/04/2015, 07/02/2017, 07/03/2018   Influenza-Unspecified 06/22/2016   PFIZER Comirnaty(Gray Top)Covid-19 Tri-Sucrose Vaccine 11/06/2019, 11/27/2019, 07/11/2020   PFIZER(Purple Top)SARS-COV-2 Vaccination 11/06/2019, 11/27/2019   Pneumococcal Conjugate-13 04/21/2016   Pneumococcal Polysaccharide-23 05/05/2012   Tdap 11/09/2014   Shingrix Completed?: No.    Education has been provided regarding the importance of this vaccine. Patient has been advised to call insurance company to determine out of pocket expense if they have not yet received this vaccine. Advised may also receive vaccine at local pharmacy or Health Dept. Verbalized acceptance and understanding.  Screening Tests Health Maintenance  Topic Date Due   COVID-19 Vaccine (6 - Booster) 10/26/2021 (Originally 09/05/2020)   Zoster Vaccines- Shingrix (1 of 2) 01/08/2022 (Originally 10/23/1948)   TETANUS/TDAP  11/09/2024   Pneumonia Vaccine 15+ Years old  Completed   INFLUENZA VACCINE  Completed   DEXA SCAN  Completed   HPV VACCINES  Aged Out   Health Maintenance There are no preventive care reminders to display for this patient.  Lung Cancer Screening: (Low Dose CT Chest recommended if Age 26-80 years, 30 pack-year currently smoking OR have quit w/in 15years.) does not qualify.   Hepatitis C Screening: does not qualify  Vision Screening: Recommended annual ophthalmology exams for early detection of glaucoma and other disorders of  the eye.  Dental Screening: Recommended annual dental exams for proper oral hygiene  Community Resource Referral / Chronic Care Management: CRR required this visit?  No   CCM required this visit?  No      Plan:   Keep all routine maintenance appointments.   I have personally reviewed and noted the following in the patients chart:   Medical and social history Use of alcohol, tobacco or illicit drugs  Current medications and supplements including opioid prescriptions. Not taking opioid.  Functional ability and status Nutritional status Physical activity Advanced directives List of other physicians Hospitalizations, surgeries, and ER visits in previous 12 months Vitals Screenings to include cognitive, depression, and falls Referrals and appointments  In addition, I have reviewed and discussed with patient certain preventive protocols, quality metrics, and best practice recommendations. A written personalized care plan for preventive services as well as general preventive health recommendations were provided to patient.     Varney Biles, LPN   03/13/6811

## 2021-10-12 ENCOUNTER — Ambulatory Visit: Payer: Medicare Other

## 2021-10-17 ENCOUNTER — Other Ambulatory Visit: Payer: Self-pay | Admitting: Internal Medicine

## 2021-11-15 ENCOUNTER — Encounter (INDEPENDENT_AMBULATORY_CARE_PROVIDER_SITE_OTHER): Payer: Medicare Other | Admitting: Ophthalmology

## 2021-11-23 ENCOUNTER — Ambulatory Visit: Payer: Medicare Other | Admitting: Dermatology

## 2021-11-27 ENCOUNTER — Ambulatory Visit: Payer: Medicare Other | Admitting: Dermatology

## 2021-11-29 ENCOUNTER — Ambulatory Visit (INDEPENDENT_AMBULATORY_CARE_PROVIDER_SITE_OTHER): Payer: Medicare Other | Admitting: Internal Medicine

## 2021-11-29 ENCOUNTER — Encounter: Payer: Self-pay | Admitting: Internal Medicine

## 2021-11-29 ENCOUNTER — Other Ambulatory Visit: Payer: Self-pay

## 2021-11-29 VITALS — BP 138/76 | HR 73 | Temp 97.7°F | Resp 16 | Ht 66.0 in | Wt 110.2 lb

## 2021-11-29 DIAGNOSIS — I2510 Atherosclerotic heart disease of native coronary artery without angina pectoris: Secondary | ICD-10-CM | POA: Diagnosis not present

## 2021-11-29 DIAGNOSIS — J479 Bronchiectasis, uncomplicated: Secondary | ICD-10-CM

## 2021-11-29 DIAGNOSIS — E7800 Pure hypercholesterolemia, unspecified: Secondary | ICD-10-CM

## 2021-11-29 DIAGNOSIS — I700 Atherosclerosis of aorta: Secondary | ICD-10-CM | POA: Diagnosis not present

## 2021-11-29 DIAGNOSIS — I10 Essential (primary) hypertension: Secondary | ICD-10-CM | POA: Diagnosis not present

## 2021-11-29 DIAGNOSIS — N2889 Other specified disorders of kidney and ureter: Secondary | ICD-10-CM

## 2021-11-29 DIAGNOSIS — Z853 Personal history of malignant neoplasm of breast: Secondary | ICD-10-CM

## 2021-11-29 DIAGNOSIS — E039 Hypothyroidism, unspecified: Secondary | ICD-10-CM

## 2021-11-29 NOTE — Progress Notes (Signed)
[name], female   DOB: 1930-02-29, 88 y.o.   MRN: [id number]   Subjective:    [name], female    DOB: 1929/10/10, 88 y.o.   MRN: [id number]  This visit occurred during the SARS-CoV-2 public health emergency.  Safety protocols were in place, including screening questions prior to the visit, additional usage of staff PPE, and extensive cleaning of exam room while observing appropriate contact time as indicated for disinfecting solutions.   Patient here for a scheduled follow up.   Chief Complaint  Patient presents with   Hypertension   Hyperlipidemia   Hypothyroidism   .   HPI Reports she feels good.  Recently returned for a 14 days cruise.  Tries to stay active.  No recent falls.  No chest pain or sob reported.  No cough or congestion.  No abdominal pain.  Bowels moving.  Weight is stable.  She is off plavix and aspirin.  Per pt, discussed with cardiology.  Taking gemtesa.  Works well for her.    Past Medical History:  Diagnosis Date   Actinic keratosis 01/28/2020   R thumb proximal nail fold    Atypical mole 05/13/2018   R distal lat thigh   Atypical mole 09/14/2014   left ant mid thigh   Atypical mole 01/19/2014   left paraspinal mid to upper back   Atypical mole 01/19/2014   right back mid to inf scapula   Atypical mole 01/19/2014   left posterior deltoid medial   Basal cell carcinoma 01/28/2020   left of midline mid dorsum nose   Basal cell carcinoma 01/06/2018   left preauricular    Basal cell carcinoma 11/22/2016   left upper lip   Basal cell carcinoma 03/17/2015   left lateral elbow   Basal cell carcinoma 07/22/2014   R medial inferior knee   Basal cell carcinoma 07/21/2020   Left dorsum hand. Nodular pattern. Excised 08/09/2020, margins free.   Basal cell carcinoma 02/23/2021   right dorsal hand (BCC/SCC), left dorsal hand, right chin - treated with EDC   Family history of breast cancer    History of breast cancer    History of  pneumonia    Hypertension    Hypothyroidism    Macular degeneration    Squamous cell carcinoma of skin 01/06/2018   right dorsum hand at ring finger MCP   Squamous cell carcinoma of skin 07/08/2017   left distal dorsum forearm    Past Surgical History:  Procedure Laterality Date   ABDOMINAL HYSTERECTOMY     ABDOMINAL HYSTERECTOMY     APPENDECTOMY     BREAST LUMPECTOMY     CATARACT EXTRACTION, BILATERAL     CHOLECYSTECTOMY     CORONARY STENT INTERVENTION N/A 07/26/2020   Procedure: CORONARY STENT INTERVENTION;  Surgeon: Isaias Cowman, MD;  Location: [facility];  Service: Cardiovascular;  Laterality: N/A;   LEFT HEART CATH AND CORONARY ANGIOGRAPHY Left 07/26/2020   Procedure: LEFT HEART CATH AND CORONARY ANGIOGRAPHY;  Surgeon: Corey Skains, MD;  Location: [facility];  Service: Cardiovascular;  Laterality: Left;   REPLACEMENT TOTAL KNEE     TONSILLECTOMY AND ADENOIDECTOMY     Family History  Problem Relation Age of Onset   CAD Mother    Cancer Maternal Aunt        unk type   Leukemia Maternal Grandmother    Breast cancer Half-Sister        dx 60s   Social  History   Socioeconomic History   Marital status: Widowed    Spouse name: Not on file   Number of children: Not on file   Years of education: Not on file   Highest education level: Not on file  Occupational History   Not on file  Tobacco Use   Smoking status: Former    Packs/day: 1.50    Years: 6.00    Pack years: 9.00    Types: Cigarettes    Quit date: 39    Years since quitting: 48.1   Smokeless tobacco: Never  Substance and Sexual Activity   Alcohol use: No    Alcohol/week: 0.0 standard drinks   Drug use: No   Sexual activity: Not on file  Other Topics Concern   Not on file  Social History Narrative   Married    Social Determinants of Health   Financial Resource Strain: Low Risk    Difficulty of Paying Living Expenses: Not hard at all  Food Insecurity: No Food  Insecurity   Worried About Charity fundraiser in the Last Year: Never true   Arboriculturist in the Last Year: Never true  Transportation Needs: No Transportation Needs   Lack of Transportation (Medical): No   Lack of Transportation (Non-Medical): No  Physical Activity: Not on file  Stress: No Stress Concern Present   Feeling of Stress : Not at all  Social Connections: Moderately Integrated   Frequency of Communication with Friends and Family: More than three times a week   Frequency of Social Gatherings with Friends and Family: More than three times a week   Attends Religious Services: More than 4 times per year   Active Member of [employer] or Organizations: Yes   Attends Archivist Meetings: More than 4 times per year   Marital Status: Widowed     Review of Systems  Constitutional:  Negative for appetite change and unexpected weight change.  HENT:  Negative for congestion and sinus pressure.   Respiratory:  Negative for cough, chest tightness and shortness of breath.   Cardiovascular:  Negative for chest pain, palpitations and leg swelling.  Gastrointestinal:  Negative for abdominal pain, diarrhea, nausea and vomiting.  Genitourinary:  Negative for difficulty urinating and dysuria.  Musculoskeletal:  Negative for joint swelling and myalgias.  Skin:  Negative for color change and rash.  Neurological:  Negative for dizziness, light-headedness and headaches.  Psychiatric/Behavioral:  Negative for agitation and dysphoric mood.       Objective:     BP 138/76    Pulse 73    Temp 97.7 F (36.5 C)    Resp 16    Ht 5\' 6"  (1.676 m)    Wt 110 lb 3.2 oz (50 kg)    SpO2 97%    BMI 17.79 kg/m  Wt Readings from Last 3 Encounters:  11/29/21 110 lb 3.2 oz (50 kg)  10/10/21 108 lb (49 kg)  07/25/21 108 lb (49 kg)    Physical Exam Vitals reviewed.  Constitutional:      General: She is not in acute distress.    Appearance: Normal appearance.  HENT:     Head: Normocephalic and  atraumatic.     Right Ear: External ear normal.     Left Ear: External ear normal.  Eyes:     General: No scleral icterus.       Right eye: No discharge.        Left eye: No discharge.  Conjunctiva/sclera: Conjunctivae normal.  Neck:     Thyroid: No thyromegaly.  Cardiovascular:     Rate and Rhythm: Normal rate and regular rhythm.  Pulmonary:     Effort: No respiratory distress.     Breath sounds: Normal breath sounds. No wheezing.  Abdominal:     General: Bowel sounds are normal.     Palpations: Abdomen is soft.     Tenderness: There is no abdominal tenderness.  Musculoskeletal:        General: No swelling or tenderness.     Cervical back: Neck supple. No tenderness.  Lymphadenopathy:     Cervical: No cervical adenopathy.  Skin:    Findings: No erythema or rash.  Neurological:     Mental Status: She is alert.  Psychiatric:        Mood and Affect: Mood normal.        Behavior: Behavior normal.     Outpatient Encounter Medications as of 11/29/2021  Medication Sig   amLODipine (NORVASC) 10 MG tablet TAKE 1 TABLET(10 MG) BY MOUTH DAILY   atorvastatin (LIPITOR) 40 MG tablet TAKE 1 TABLET(40 MG) BY MOUTH DAILY   calcium citrate-vitamin D (CITRACAL+D) 315-200 MG-UNIT tablet Take 1 tablet by mouth 2 (two) times daily.   irbesartan (AVAPRO) 150 MG tablet Take 150 mg by mouth daily.   levothyroxine (SYNTHROID) 125 MCG tablet TAKE 1 TABLET BY MOUTH EVERY DAY BEFORE BREAKFAST   metoprolol succinate (TOPROL-XL) 50 MG 24 hr tablet TAKE 1 TABLET(50 MG) BY MOUTH EVERY DAY WITH OR IMMEDIATELY FOLLOWING A MEAL   Multiple Vitamin (MULTIVITAMIN) capsule Take 1 capsule by mouth daily.   Multiple Vitamins-Minerals (PRESERVISION AREDS 2 PO) Take 1 tablet by mouth 2 (two) times daily.    Polyethyl Glycol-Propyl Glycol (SYSTANE) 0.4-0.3 % SOLN Place 1 drop into both eyes in the morning, at noon, and at bedtime.   Vibegron (GEMTESA) 75 MG TABS Take 75 mg by mouth daily.   [DISCONTINUED]  aspirin EC 81 MG EC tablet Take 1 tablet (81 mg total) by mouth daily. Swallow whole.   [DISCONTINUED] clopidogrel (PLAVIX) 75 MG tablet Take 75 mg by mouth daily.   No facility-administered encounter medications on file as of 11/29/2021.     Lab Results  Component Value Date   WBC 10.3 07/11/2021   HGB 14.6 07/11/2021   HCT 43.1 07/11/2021   PLT 217 07/11/2021   GLUCOSE 104 (H) 07/11/2021   CHOL 127 06/08/2020   TRIG 60.0 06/08/2020   HDL 61.70 06/08/2020   LDLCALC 53 06/08/2020   ALT 28 07/11/2021   AST 52 (H) 07/11/2021   NA 137 07/11/2021   K 3.9 07/11/2021   CL 98 07/11/2021   CREATININE 0.84 07/11/2021   BUN 20 07/11/2021   CO2 31 07/11/2021   TSH 3.23 08/10/2020   HGBA1C 5.5 05/05/2020    CT Abdomen Pelvis W Wo Contrast  Result Date: 07/22/2021 CLINICAL DATA:  Surveillance of urologic cancer. Six-month follow-up. EXAM: CT ABDOMEN AND PELVIS WITHOUT AND WITH CONTRAST TECHNIQUE: Multidetector CT imaging of the abdomen and pelvis was performed following the standard protocol before and following the bolus administration of intravenous contrast. CONTRAST:  98mL OMNIPAQUE IOHEXOL 350 MG/ML SOLN COMPARISON:  Multiple priors including most recent CT January 03, 2021 FINDINGS: Lower chest: Bronchiectasis and volume loss in the right middle lobe are similar prior studies. Tree in bud nodularity in the right lower lobe appears similar to prior and are likely infectious or inflammatory. Hepatobiliary: No suspicious hepatic lesion.  Gallbladder surgically absent. Stable mild biliary ductal dilation post cholecystectomy. Pancreas: Within normal limits. Spleen: Within normal limits. Adrenals/Urinary Tract: Bilateral adrenal glands are unremarkable. Heterogeneously enhancing exophytic left lower pole renal mass measures 3.4 x 2.8 x 3.7 cm, unchanged from prior. No additional solid enhancing renal masses. Stable low-density bilateral renal lesions which are likely cysts. Left peripelvic cysts.  Stable mild bilateral pelviectasis. Bladder diverticulum extending posterolaterally. Stomach/Bowel: Stomach is unremarkable for degree of distension. No pathologic dilation of small or large bowel. Colonic diverticulosis without findings of acute diverticulitis. Vascular/Lymphatic: No pathologically enlarged abdominal or pelvic lymph nodes visualized. Scattered small retroperitoneal lymph nodes are again visualized and appear unchanged. Aortic atherosclerosis.  The renal veins and IVC appear normal. Reproductive: Hysterectomy.  No suspicious adnexal masses. Other: No abdominopelvic ascites. Musculoskeletal: Multilevel degenerative changes spine. No aggressive lytic or blastic lesion of bone identified. IMPRESSION: 1. Stable appearance of the 3.7 cm heterogeneously enhancing left lower pole renal mass, consistent with renal cell carcinoma. Small retroperitoneal lymph nodes are unchanged. No evidence of metastatic disease. 2. Small bilateral renal cysts and mild pelvicaliectasis unchanged. 3. Small bladder diverticulum. 4. Colonic diverticulosis without findings of acute diverticulitis. 5. Aortic Atherosclerosis (ICD10-I70.0). Electronically Signed   By: Dahlia Bailiff M.D.   On: 07/22/2021 16:41       Assessment & Plan:   Problem List Items Addressed This Visit     Aortic atherosclerosis ([place])    Declines statin medication. Continue blood pressure control.       Bronchiectasis without complication (HCC)    No increased sob.  Breathing stable.       CAD (coronary artery disease)    Sees cardiology. Has CAD and is s/p stent placement.  Was on plavix and aspirin.  Off now.   Reports discussed with cardiology.  Continue risk factor modification.       History of breast cancer    S/p lumpectomy.  Declines mammogram.       Hypercholesterolemia    Declines cholesterol medication.  Follow lipid panel.       Relevant Orders   Lipid panel   Hepatic function panel   Hypertension - Primary     Continues avapro, amlodipine and metoprolol.  Follow pressures. Follow metabolic panel.       Relevant Orders   CBC with Differential/Platelet   Basic metabolic panel   Hypothyroidism    On thyroid replacement.  Follow tsh.       Relevant Orders   TSH   Kidney mass    Has been followed by urology.  No mets to lung.  Elected continue observation.  Discussed with her today regarding continued f/u with urology.  She declines at this time.  Desires no f/u or CT scan.  Will notify me if changes her mind.          Einar Pheasant, MD

## 2021-11-30 ENCOUNTER — Encounter: Payer: Self-pay | Admitting: Internal Medicine

## 2021-11-30 NOTE — Assessment & Plan Note (Signed)
Has been followed by urology.  No mets to lung.  Elected continue observation.  Discussed with her today regarding continued f/u with urology.  She declines at this time.  Desires no f/u or CT scan.  Will notify me if changes her mind.

## 2021-11-30 NOTE — Assessment & Plan Note (Signed)
On thyroid replacement.  Follow tsh.  

## 2021-11-30 NOTE — Assessment & Plan Note (Signed)
S/p lumpectomy.  Declines mammogram.  

## 2021-11-30 NOTE — Assessment & Plan Note (Signed)
Continues avapro, amlodipine and metoprolol.  Follow pressures. Follow metabolic panel.

## 2021-11-30 NOTE — Assessment & Plan Note (Signed)
No increased sob.  Breathing stable.  

## 2021-11-30 NOTE — Assessment & Plan Note (Signed)
Declines cholesterol medication.  Follow lipid panel.  

## 2021-11-30 NOTE — Assessment & Plan Note (Signed)
Sees cardiology. Has CAD and is s/p stent placement.  Was on plavix and aspirin.  Off now.   Reports discussed with cardiology.  Continue risk factor modification.

## 2021-11-30 NOTE — Assessment & Plan Note (Signed)
Declines statin medication. Continue blood pressure control.

## 2021-12-01 ENCOUNTER — Other Ambulatory Visit: Payer: Self-pay

## 2021-12-01 ENCOUNTER — Encounter (INDEPENDENT_AMBULATORY_CARE_PROVIDER_SITE_OTHER): Payer: Medicare Other | Admitting: Ophthalmology

## 2021-12-01 DIAGNOSIS — H35033 Hypertensive retinopathy, bilateral: Secondary | ICD-10-CM

## 2021-12-01 DIAGNOSIS — H35372 Puckering of macula, left eye: Secondary | ICD-10-CM

## 2021-12-01 DIAGNOSIS — I10 Essential (primary) hypertension: Secondary | ICD-10-CM | POA: Diagnosis not present

## 2021-12-01 DIAGNOSIS — H353111 Nonexudative age-related macular degeneration, right eye, early dry stage: Secondary | ICD-10-CM | POA: Diagnosis not present

## 2021-12-01 DIAGNOSIS — H43813 Vitreous degeneration, bilateral: Secondary | ICD-10-CM

## 2021-12-26 ENCOUNTER — Other Ambulatory Visit: Payer: Self-pay | Admitting: Internal Medicine

## 2022-01-02 ENCOUNTER — Other Ambulatory Visit: Payer: Self-pay

## 2022-01-02 ENCOUNTER — Other Ambulatory Visit (INDEPENDENT_AMBULATORY_CARE_PROVIDER_SITE_OTHER): Payer: Medicare Other

## 2022-01-02 DIAGNOSIS — E039 Hypothyroidism, unspecified: Secondary | ICD-10-CM | POA: Diagnosis not present

## 2022-01-02 DIAGNOSIS — I10 Essential (primary) hypertension: Secondary | ICD-10-CM

## 2022-01-02 DIAGNOSIS — E7800 Pure hypercholesterolemia, unspecified: Secondary | ICD-10-CM

## 2022-01-02 LAB — HEPATIC FUNCTION PANEL
ALT: 18 U/L (ref 0–35)
AST: 33 U/L (ref 0–37)
Albumin: 4.1 g/dL (ref 3.5–5.2)
Alkaline Phosphatase: 85 U/L (ref 39–117)
Bilirubin, Direct: 0.2 mg/dL (ref 0.0–0.3)
Total Bilirubin: 1 mg/dL (ref 0.2–1.2)
Total Protein: 6.7 g/dL (ref 6.0–8.3)

## 2022-01-02 LAB — CBC WITH DIFFERENTIAL/PLATELET
Basophils Absolute: 0 10*3/uL (ref 0.0–0.1)
Basophils Relative: 0.2 % (ref 0.0–3.0)
Eosinophils Absolute: 0.1 10*3/uL (ref 0.0–0.7)
Eosinophils Relative: 1.2 % (ref 0.0–5.0)
HCT: 43.9 % (ref 36.0–46.0)
Hemoglobin: 14.5 g/dL (ref 12.0–15.0)
Lymphocytes Relative: 24.9 % (ref 12.0–46.0)
Lymphs Abs: 1.6 10*3/uL (ref 0.7–4.0)
MCHC: 33.1 g/dL (ref 30.0–36.0)
MCV: 97.2 fl (ref 78.0–100.0)
Monocytes Absolute: 0.6 10*3/uL (ref 0.1–1.0)
Monocytes Relative: 9.6 % (ref 3.0–12.0)
Neutro Abs: 4 10*3/uL (ref 1.4–7.7)
Neutrophils Relative %: 64.1 % (ref 43.0–77.0)
Platelets: 225 10*3/uL (ref 150.0–400.0)
RBC: 4.52 Mil/uL (ref 3.87–5.11)
RDW: 14.1 % (ref 11.5–15.5)
WBC: 6.2 10*3/uL (ref 4.0–10.5)

## 2022-01-02 LAB — BASIC METABOLIC PANEL
BUN: 15 mg/dL (ref 6–23)
CO2: 37 mEq/L — ABNORMAL HIGH (ref 19–32)
Calcium: 9.5 mg/dL (ref 8.4–10.5)
Chloride: 100 mEq/L (ref 96–112)
Creatinine, Ser: 0.88 mg/dL (ref 0.40–1.20)
GFR: 57.95 mL/min — ABNORMAL LOW (ref >60.00)
Glucose, Bld: 87 mg/dL (ref 70–99)
Potassium: 4.4 mEq/L (ref 3.5–5.1)
Sodium: 144 mEq/L (ref 135–145)

## 2022-01-02 LAB — LIPID PANEL
Cholesterol: 143 mg/dL (ref 0–200)
HDL: 65.6 mg/dL (ref >39.00)
LDL Cholesterol: 64 mg/dL (ref 0–99)
NonHDL: 77.24
Total CHOL/HDL Ratio: 2
Triglycerides: 68 mg/dL (ref 0.0–149.0)
VLDL: 13.6 mg/dL (ref 0.0–40.0)

## 2022-01-02 LAB — TSH: TSH: 5.88 u[IU]/mL — ABNORMAL HIGH (ref 0.35–5.50)

## 2022-01-08 ENCOUNTER — Telehealth: Payer: Self-pay | Admitting: Internal Medicine

## 2022-01-08 ENCOUNTER — Other Ambulatory Visit: Payer: Self-pay

## 2022-01-08 MED ORDER — LEVOTHYROXINE SODIUM 137 MCG PO TABS: 137.0000 ug | ORAL_TABLET | Freq: Every day | ORAL | 1 refills | Status: DC

## 2022-01-08 NOTE — Telephone Encounter (Signed)
Pt called in stating that someone called her from our office advising her that her medication (levothyroxine (SYNTHROID) 125 MCG tablet) will be change to 18mg... Pt stated she went to pharmacy over the weekend and they didn't have the medication... Pt is requesting for script to be sent over to pharmacy... Pt requesting callback..Marland Kitchen ?

## 2022-01-08 NOTE — Telephone Encounter (Signed)
MEDICATION SENT IN. PATIENT IS AWARE. ?

## 2022-01-09 ENCOUNTER — Ambulatory Visit (INDEPENDENT_AMBULATORY_CARE_PROVIDER_SITE_OTHER): Payer: Medicare Other | Admitting: Dermatology

## 2022-01-09 ENCOUNTER — Encounter: Payer: Self-pay | Admitting: Dermatology

## 2022-01-09 DIAGNOSIS — L821 Other seborrheic keratosis: Secondary | ICD-10-CM

## 2022-01-09 DIAGNOSIS — I2510 Atherosclerotic heart disease of native coronary artery without angina pectoris: Secondary | ICD-10-CM

## 2022-01-09 DIAGNOSIS — L820 Inflamed seborrheic keratosis: Secondary | ICD-10-CM

## 2022-01-09 DIAGNOSIS — L578 Other skin changes due to chronic exposure to nonionizing radiation: Secondary | ICD-10-CM

## 2022-01-09 DIAGNOSIS — L570 Actinic keratosis: Secondary | ICD-10-CM

## 2022-01-09 NOTE — Progress Notes (Signed)
? ?  Follow-Up Visit ?  ?Subjective  ?Alexa Pham is a 88 y.o. female who presents for the following: Actinic Keratosis (Of the face - patient is here today to check for new or persistent precancerous skin lesions.). The patient has spots, moles and lesions to be evaluated, some may be new or changing. ? ?The following portions of the chart were reviewed this encounter and updated as appropriate:  ? Tobacco  Allergies  Meds  Problems  Med Hx  Surg Hx  Fam Hx   ?  ?Review of Systems:  No other skin or systemic complaints except as noted in HPI or Assessment and Plan. ? ?Objective  ?Well appearing patient in no apparent distress; mood and affect are within normal limits. ? ?A focused examination was performed including the face. Relevant physical exam findings are noted in the Assessment and Plan. ? ?L nasal tip x 1 ?Erythematous thin papules/macules with gritty scale.  ? ?L lat canthus x 1, L middle finger x 1, R index finger x 1 (3) ?Erythematous stuck-on, waxy papule or plaque ? ? ? ?Assessment & Plan  ?AK (actinic keratosis) ?L nasal tip x 1 ? ?If not resolved in 6 weeks recommend biopsy. ? ?Destruction of lesion - L nasal tip x 1 ?Complexity: simple   ?Destruction method: cryotherapy   ?Informed consent: discussed and consent obtained   ?Timeout:  patient name, date of birth, surgical site, and procedure verified ?Lesion destroyed using liquid nitrogen: Yes   ?Region frozen until ice ball extended beyond lesion: Yes   ?Outcome: patient tolerated procedure well with no complications   ?Post-procedure details: wound care instructions given   ? ?Inflamed seborrheic keratosis (3) ?L lat canthus x 1, L middle finger x 1, R index finger x 1 ? ?Destruction of lesion - L lat canthus x 1, L middle finger x 1, R index finger x 1 ?Complexity: simple   ?Destruction method: cryotherapy   ?Informed consent: discussed and consent obtained   ?Timeout:  patient name, date of birth, surgical site, and procedure  verified ?Lesion destroyed using liquid nitrogen: Yes   ?Region frozen until ice ball extended beyond lesion: Yes   ?Outcome: patient tolerated procedure well with no complications   ?Post-procedure details: wound care instructions given   ? ?Actinic Damage ?- chronic, secondary to cumulative UV radiation exposure/sun exposure over time ?- diffuse scaly erythematous macules with underlying dyspigmentation ?- Recommend daily broad spectrum sunscreen SPF 30+ to sun-exposed areas, reapply every 2 hours as needed.  ?- Recommend staying in the shade or wearing long sleeves, sun glasses (UVA+UVB protection) and wide brim hats (4-inch brim around the entire circumference of the hat). ?- Call for new or changing lesions. ? ?Seborrheic Keratoses ?- Stuck-on, waxy, tan-brown papules and/or plaques  ?- Benign-appearing ?- Discussed benign etiology and prognosis. ?- Observe ?- Call for any changes ? ?Return in about 6 weeks (around 02/20/2022) for recheck of AK's, ISK's . ? ?IRudell Cobb, CMA, am acting as scribe for Sarina Ser, MD . ?Documentation: I have reviewed the above documentation for accuracy and completeness, and I agree with the above. ? ?Sarina Ser, MD ? ?

## 2022-01-09 NOTE — Patient Instructions (Signed)

## 2022-01-12 ENCOUNTER — Ambulatory Visit
Admission: RE | Admit: 2022-01-12 | Discharge: 2022-01-12 | Disposition: A | Discharge status: Home / Self Care | Payer: Medicare Other | Source: Ambulatory Visit | Attending: Urology | Admitting: Urology

## 2022-01-12 DIAGNOSIS — N2889 Other specified disorders of kidney and ureter: Secondary | ICD-10-CM | POA: Insufficient documentation

## 2022-01-12 MED ORDER — IOHEXOL 300 MG/ML  SOLN
80.0000 mL | Freq: Once | INTRAMUSCULAR | Status: AC | PRN
  Administered 2022-01-12: 80 mL via INTRAVENOUS

## 2022-01-15 ENCOUNTER — Ambulatory Visit
Admission: RE | Admit: 2022-01-15 | Discharge: 2022-01-15 | Disposition: A | Discharge status: Home / Self Care | Payer: Medicare Other | Source: Ambulatory Visit | Attending: Family | Admitting: Family

## 2022-01-15 DIAGNOSIS — Z780 Asymptomatic menopausal state: Secondary | ICD-10-CM | POA: Insufficient documentation

## 2022-01-22 NOTE — Progress Notes (Signed)
? ?01/23/2022 ?11:34 AM  ? ?Alexa Pham ?Jul 05, 1930 ?[id number] ? ?Referring provider:  ?Einar Pheasant, MD ?[address] ?[address] ?[place],  [place] [address] ?Chief Complaint  ?Patient presents with  ? Results  ? ? ?HPI: ?Alexa Pham is a 88 y.o.female with a personal history of renal mass who presents today for a 6 month follow-up with CT abd.  ? ?07/21/2021 CT of abdomen and pelvis without and with contrast revealed stable appearance of the 3.7 cm heterogeneously enhancing left lower pole renal mass, consistent with renal cell carcinoma. Small retroperitoneal lymph nodes are unchanged. No evidence of metastatic disease.  ? ?CT abdomen and pelvis on 01/12/2022 visualized  3.5 by 2.9 cm exophytic complex enhancing left renal stable by measurements. This abuts the posterior perirenal fascia. Stable small right kidney upper pole cyst. Stable left kidney lower pole parapelvic cyst. Mild fullness of the left renal collecting system without overt hydronephrosis. No urinary tract calculi are identified. Small posterior bladder diverticulum. ? ?She is feeling well.  She does think gemtesa continues to help but not as much as at first.  Overall pretty healthy.   ? ?PMH: ?Past Medical History:  ?Diagnosis Date  ? Actinic keratosis 01/28/2020  ? R thumb proximal nail fold   ? Atypical mole 05/13/2018  ? R distal lat thigh  ? Atypical mole 09/14/2014  ? left ant mid thigh  ? Atypical mole 01/19/2014  ? left paraspinal mid to upper back  ? Atypical mole 01/19/2014  ? right back mid to inf scapula  ? Atypical mole 01/19/2014  ? left posterior deltoid medial  ? Basal cell carcinoma 01/28/2020  ? left of midline mid dorsum nose  ? Basal cell carcinoma 01/06/2018  ? left preauricular   ? Basal cell carcinoma 11/22/2016  ? left upper lip  ? Basal cell carcinoma 03/17/2015  ? left lateral elbow  ? Basal cell carcinoma 07/22/2014  ? R medial inferior knee  ? Basal cell carcinoma 07/21/2020  ? Left dorsum hand. Nodular  pattern. Excised 08/09/2020, margins free.  ? Basal cell carcinoma 02/23/2021  ? right dorsal hand (BCC/SCC), left dorsal hand, right chin - treated with EDC  ? Family history of breast cancer   ? History of breast cancer   ? History of pneumonia   ? Hypertension   ? Hypothyroidism   ? Macular degeneration   ? Squamous cell carcinoma of skin 01/06/2018  ? right dorsum hand at ring finger MCP  ? Squamous cell carcinoma of skin 07/08/2017  ? left distal dorsum forearm   ? ? ?Surgical History: ?Past Surgical History:  ?Procedure Laterality Date  ? ABDOMINAL HYSTERECTOMY    ? ABDOMINAL HYSTERECTOMY    ? APPENDECTOMY    ? BREAST LUMPECTOMY    ? CATARACT EXTRACTION, BILATERAL    ? CHOLECYSTECTOMY    ? CORONARY STENT INTERVENTION N/A 07/26/2020  ? Procedure: CORONARY STENT INTERVENTION;  Surgeon: Isaias Cowman, MD;  Location: [facility];  Service: Cardiovascular;  Laterality: N/A;  ? LEFT HEART CATH AND CORONARY ANGIOGRAPHY Left 07/26/2020  ? Procedure: LEFT HEART CATH AND CORONARY ANGIOGRAPHY;  Surgeon: Corey Skains, MD;  Location: [facility];  Service: Cardiovascular;  Laterality: Left;  ? REPLACEMENT TOTAL KNEE    ? TONSILLECTOMY AND ADENOIDECTOMY    ? ? ?Home Medications:  ?Allergies as of 01/23/2022   ? ?   Reactions  ? Ticagrelor Shortness Of Breath  ? Extreme   ? ?  ? ?  ?  Medication List  ?  ? ?  ? Accurate as of January 23, 2022 11:59 PM. If you have any questions, ask your nurse or doctor.  ?  ?  ? ?  ? ?amLODipine 10 MG tablet ?Commonly known as: NORVASC ?TAKE 1 TABLET(10 MG) BY MOUTH DAILY ?  ?atorvastatin 40 MG tablet ?Commonly known as: LIPITOR ?TAKE 1 TABLET(40 MG) BY MOUTH DAILY ?  ?calcium citrate-vitamin D 315-200 MG-UNIT tablet ?Commonly known as: CITRACAL+D ?Take 1 tablet by mouth 2 (two) times daily. ?  ?Gemtesa 75 MG Tabs ?Generic drug: Vibegron ?Take 75 mg by mouth daily. ?  ?irbesartan 150 MG tablet ?Commonly known as: AVAPRO ?Take 150 mg by mouth daily. ?  ?levothyroxine  137 MCG tablet ?Commonly known as: SYNTHROID ?Take 1 tablet (137 mcg total) by mouth daily before breakfast. ?  ?metoprolol succinate 50 MG 24 hr tablet ?Commonly known as: TOPROL-XL ?TAKE 1 TABLET(50 MG) BY MOUTH EVERY DAY WITH OR IMMEDIATELY FOLLOWING A MEAL ?  ?multivitamin capsule ?Take 1 capsule by mouth daily. ?  ?PRESERVISION AREDS 2 PO ?Take 1 tablet by mouth 2 (two) times daily. ?  ?Systane 0.4-0.3 % Soln ?Generic drug: Polyethyl Glycol-Propyl Glycol ?Place 1 drop into both eyes in the morning, at noon, and at bedtime. ?  ? ?  ? ? ?Allergies:  ?Allergies  ?Allergen Reactions  ? Ticagrelor Shortness Of Breath  ?  Extreme   ? ? ?Family History: ?Family History  ?Problem Relation Age of Onset  ? CAD Mother   ? Cancer Maternal Aunt   ?     unk type  ? Leukemia Maternal Grandmother   ? Breast cancer Half-Sister   ?     dx 2s  ? ? ?Social History:  reports that she quit smoking about 48 years ago. Her smoking use included cigarettes. She has a 9.00 pack-year smoking history. She has never used smokeless tobacco. She reports that she does not drink alcohol and does not use drugs. ? ? ?Physical Exam: ?BP 137/73   Pulse 80   Ht '5\' 6"'$  (1.676 m)   Wt 110 lb (49.9 kg)   BMI 17.75 kg/m?   ?Constitutional:  Alert and oriented, No acute distress. ?HEENT: [place] AT, moist mucus membranes.  Trachea midline, no masses. ?Cardiovascular: No clubbing, cyanosis, or edema. ?Respiratory: Normal respiratory effort, no increased work of breathing. ?Skin: No rashes, bruises or suspicious lesions. ?Neurologic: Grossly intact, no focal deficits, moving all 4 extremities. ?Psychiatric: Normal mood and affect. ? ?Laboratory Data: ?Lab Results  ?Component Value Date  ? CREATININE 0.88 01/02/2022  ? ?Lab Results  ?Component Value Date  ? HGBA1C 5.5 05/05/2020  ? ? ?Pertinent Imaging: ?CLINICAL DATA:  Left renal mass compatible with renal cell ?carcinoma. Surveillance assessment. ?  ?* Tracking Code: BO * ?  ?EXAM: ?CT ABDOMEN AND PELVIS  WITHOUT AND WITH CONTRAST ?  ?TECHNIQUE: ?Multidetector CT imaging of the abdomen and pelvis was performed ?following the standard protocol before and following the bolus ?administration of intravenous contrast. ?  ?RADIATION DOSE REDUCTION: This exam was performed according to the ?departmental dose-optimization program which includes automated ?exposure control, adjustment of the mA and/or kV according to ?patient size and/or use of iterative reconstruction technique. ?  ?CONTRAST:  63m OMNIPAQUE IOHEXOL 300 MG/ML  SOLN ?  ?COMPARISON:  Multiple exams, including 07/21/2021 ?  ?FINDINGS: ?Lower chest: Mild cardiomegaly. Chronic right middle lobe airspace ?opacity appears unchanged. Reticulonodular tree-in-bud opacities ?posteriorly in the right lower lobe are similar to  prior and likely ?from atypical infectious bronchiolitis. Mild scarring medially in ?the right lower lobe is unchanged. There is some mild bronchiectasis ?with airway plugging anteriorly in the left lower lobe, although ?improved from prior. ?  ?There is some contrast medium in the distal esophagus, suspicious ?for reflux or dysmotility. There is some calcification along the ?aortic valve. ?  ?Hepatobiliary: 6 mm focus of arterial phase enhancement in the left ?hepatic lobe on image 22 of series 6 does not appear substantially ?changed from 10/09/2018 chest CT, and is probably a small hemangioma ?or similar benign lesion. The gallbladder is surgically absent. ?Common bile duct 0.7 cm diameter, within normal limits. ?  ?Pancreas: Upper normal size of the dorsal pancreatic duct. Otherwise ?unremarkable. ?  ?Spleen: Unremarkable ?  ?Adrenals/Urinary Tract: Both adrenal glands appear normal. ?  ?3.5 by 2.9 cm exophytic complex enhancing left renal mass on image ?47 series 11, stable by my measurements. This abuts the posterior ?perirenal fascia. ?  ?Stable small right kidney upper pole cyst. Stable left kidney lower ?pole parapelvic cyst. Mild fullness  of the left renal collecting ?system without overt hydronephrosis. No urinary tract calculi are ?identified. Small posterior bladder diverticulum. ?  ?Stomach/Bowel: Wall thickening in the gastric cardia and

## 2022-01-23 ENCOUNTER — Ambulatory Visit (INDEPENDENT_AMBULATORY_CARE_PROVIDER_SITE_OTHER): Payer: Medicare Other | Admitting: Urology

## 2022-01-23 VITALS — BP 137/73 | HR 80 | Ht 66.0 in | Wt 110.0 lb

## 2022-01-23 DIAGNOSIS — I2510 Atherosclerotic heart disease of native coronary artery without angina pectoris: Secondary | ICD-10-CM | POA: Diagnosis not present

## 2022-01-23 DIAGNOSIS — R350 Frequency of micturition: Secondary | ICD-10-CM

## 2022-01-23 DIAGNOSIS — N2889 Other specified disorders of kidney and ureter: Secondary | ICD-10-CM | POA: Diagnosis not present

## 2022-02-07 ENCOUNTER — Other Ambulatory Visit: Payer: Self-pay | Admitting: Internal Medicine

## 2022-02-08 ENCOUNTER — Ambulatory Visit (INDEPENDENT_AMBULATORY_CARE_PROVIDER_SITE_OTHER): Payer: Medicare Other | Admitting: Dermatology

## 2022-02-08 DIAGNOSIS — L578 Other skin changes due to chronic exposure to nonionizing radiation: Secondary | ICD-10-CM | POA: Diagnosis not present

## 2022-02-08 DIAGNOSIS — I2510 Atherosclerotic heart disease of native coronary artery without angina pectoris: Secondary | ICD-10-CM | POA: Diagnosis not present

## 2022-02-08 DIAGNOSIS — L570 Actinic keratosis: Secondary | ICD-10-CM | POA: Diagnosis not present

## 2022-02-08 DIAGNOSIS — Z85828 Personal history of other malignant neoplasm of skin: Secondary | ICD-10-CM

## 2022-02-08 DIAGNOSIS — L820 Inflamed seborrheic keratosis: Secondary | ICD-10-CM | POA: Diagnosis not present

## 2022-02-08 DIAGNOSIS — L821 Other seborrheic keratosis: Secondary | ICD-10-CM

## 2022-02-08 NOTE — Progress Notes (Signed)
Follow-Up Visit   Subjective  Alexa Pham is a 88 y.o. female who presents for the following: Actinic Keratosis (L nasal tip - S/P LN2, recheck today) and ISK's (L lat canthus, L middle finger, R index finger - recheck today). The patient has spots, moles and lesions to be evaluated, some may be new or changing and the patient has concerns that these could be cancer.  The following portions of the chart were reviewed this encounter and updated as appropriate:   Tobacco  Allergies  Meds  Problems  Med Hx  Surg Hx  Fam Hx     Review of Systems:  No other skin or systemic complaints except as noted in HPI or Assessment and Plan.  Objective  Well appearing patient in no apparent distress; mood and affect are within normal limits.  A focused examination was performed including the face and hands. Relevant physical exam findings are noted in the Assessment and Plan.  L nasal tip x 1 Erythematous thin papules/macules with gritty scale.   L lat canthus x 1 Erythematous stuck-on, waxy papule or plaque   Assessment & Plan  AK (actinic keratosis) L nasal tip x 1  Some residual AK on the L nasal tip   Destruction of lesion - L nasal tip x 1 Complexity: simple   Destruction method: cryotherapy   Informed consent: discussed and consent obtained   Timeout:  patient name, date of birth, surgical site, and procedure verified Lesion destroyed using liquid nitrogen: Yes   Region frozen until ice ball extended beyond lesion: Yes   Outcome: patient tolerated procedure well with no complications   Post-procedure details: wound care instructions given    Inflamed seborrheic keratosis L lat canthus x 1 = upper eyelid margin  Destruction of lesion - L lat canthus x 1 Complexity: simple   Destruction method: cryotherapy   Informed consent: discussed and consent obtained   Timeout:  patient name, date of birth, surgical site, and procedure verified Lesion destroyed using liquid  nitrogen: Yes   Region frozen until ice ball extended beyond lesion: Yes   Outcome: patient tolerated procedure well with no complications   Post-procedure details: wound care instructions given    Actinic Damage - chronic, secondary to cumulative UV radiation exposure/sun exposure over time - diffuse scaly erythematous macules with underlying dyspigmentation - Recommend daily broad spectrum sunscreen SPF 30+ to sun-exposed areas, reapply every 2 hours as needed.  - Recommend staying in the shade or wearing long sleeves, sun glasses (UVA+UVB protection) and wide brim hats (4-inch brim around the entire circumference of the hat). - Call for new or changing lesions.  Seborrheic Keratoses - Stuck-on, waxy, tan-brown papules and/or plaques  - Benign-appearing - Discussed benign etiology and prognosis. - Observe - Call for any changes  History of Basal Cell Carcinoma of the Skin - No evidence of recurrence today - Recommend regular full body skin exams - Recommend daily broad spectrum sunscreen SPF 30+ to sun-exposed areas, reapply every 2 hours as needed.  - Call if any new or changing lesions are noted between office visits  History of Squamous Cell Carcinoma of the Skin - No evidence of recurrence today - No lymphadenopathy - Recommend regular full body skin exams - Recommend daily broad spectrum sunscreen SPF 30+ to sun-exposed areas, reapply every 2 hours as needed.  - Call if any new or changing lesions are noted between office visits  Return in about 6 months (around 08/11/2022) for TBSE.  I,  Rudell Cobb, CMA, am acting as scribe for Sarina Ser, MD . Documentation: I have reviewed the above documentation for accuracy and completeness, and I agree with the above.  Sarina Ser, MD

## 2022-02-08 NOTE — Patient Instructions (Signed)

## 2022-02-12 ENCOUNTER — Ambulatory Visit (INDEPENDENT_AMBULATORY_CARE_PROVIDER_SITE_OTHER): Payer: Medicare Other | Admitting: Internal Medicine

## 2022-02-12 ENCOUNTER — Other Ambulatory Visit: Payer: Medicare Other

## 2022-02-12 DIAGNOSIS — I10 Essential (primary) hypertension: Secondary | ICD-10-CM | POA: Diagnosis not present

## 2022-02-12 DIAGNOSIS — M8589 Other specified disorders of bone density and structure, multiple sites: Secondary | ICD-10-CM

## 2022-02-12 DIAGNOSIS — I2510 Atherosclerotic heart disease of native coronary artery without angina pectoris: Secondary | ICD-10-CM

## 2022-02-12 DIAGNOSIS — J479 Bronchiectasis, uncomplicated: Secondary | ICD-10-CM

## 2022-02-12 MED ORDER — IBANDRONATE SODIUM 150 MG PO TABS
150.0000 mg | ORAL_TABLET | ORAL | 1 refills | Status: DC
Start: 2022-02-12 — End: 2023-01-28

## 2022-02-12 NOTE — Progress Notes (Signed)
[name], female   DOB: 1930-04-21, 88 y.o.   MRN: [id number] ? ? ?Subjective:  ? ? [name], female    DOB: Sep 08, 1930, 88 y.o.   MRN: [id number] ? ?This visit occurred during the SARS-CoV-2 public health emergency.  Safety protocols were in place, including screening questions prior to the visit, additional usage of staff PPE, and extensive cleaning of exam room while observing appropriate contact time as indicated for disinfecting solutions.  ? ?Patient here for work in appt.  ? ?Chief Complaint  ?Patient presents with  ? Follow-up  ?  FOLLOW UP TO DISCUSS BONE SCAN  ? .  ? ?HPI ?Work in to discuss bone density.  States she is doing well.  Feels good.  Tries to stay active.  Walking.  Discussed bone density.  Discussed osteopenia.  Discussed walking with small hand weights.  No chest pain.  Breathing stable.  No sob.  No abdominal pain or bowel change.  Discussed treatment options.  She is interested in oral bisphosphonate. No acid reflux.  No trouble swallowing.  ? ? ?Past Medical History:  ?Diagnosis Date  ? Actinic keratosis 01/28/2020  ? R thumb proximal nail fold   ? Atypical mole 05/13/2018  ? R distal lat thigh  ? Atypical mole 09/14/2014  ? left ant mid thigh  ? Atypical mole 01/19/2014  ? left paraspinal mid to upper back  ? Atypical mole 01/19/2014  ? right back mid to inf scapula  ? Atypical mole 01/19/2014  ? left posterior deltoid medial  ? Basal cell carcinoma 01/28/2020  ? left of midline mid dorsum nose  ? Basal cell carcinoma 01/06/2018  ? left preauricular   ? Basal cell carcinoma 11/22/2016  ? left upper lip  ? Basal cell carcinoma 03/17/2015  ? left lateral elbow  ? Basal cell carcinoma 07/22/2014  ? R medial inferior knee  ? Basal cell carcinoma 07/21/2020  ? Left dorsum hand. Nodular pattern. Excised 08/09/2020, margins free.  ? Basal cell carcinoma 02/23/2021  ? right dorsal hand (BCC/SCC), left dorsal hand, right chin - treated with EDC  ? Family history of  breast cancer   ? History of breast cancer   ? History of pneumonia   ? Hypertension   ? Hypothyroidism   ? Macular degeneration   ? Squamous cell carcinoma of skin 01/06/2018  ? right dorsum hand at ring finger MCP  ? Squamous cell carcinoma of skin 07/08/2017  ? left distal dorsum forearm   ? ?Past Surgical History:  ?Procedure Laterality Date  ? ABDOMINAL HYSTERECTOMY    ? ABDOMINAL HYSTERECTOMY    ? APPENDECTOMY    ? BREAST LUMPECTOMY    ? CATARACT EXTRACTION, BILATERAL    ? CHOLECYSTECTOMY    ? CORONARY STENT INTERVENTION N/A 07/26/2020  ? Procedure: CORONARY STENT INTERVENTION;  Surgeon: Isaias Cowman, MD;  Location: [facility];  Service: Cardiovascular;  Laterality: N/A;  ? LEFT HEART CATH AND CORONARY ANGIOGRAPHY Left 07/26/2020  ? Procedure: LEFT HEART CATH AND CORONARY ANGIOGRAPHY;  Surgeon: Corey Skains, MD;  Location: [facility];  Service: Cardiovascular;  Laterality: Left;  ? REPLACEMENT TOTAL KNEE    ? TONSILLECTOMY AND ADENOIDECTOMY    ? ?Family History  ?Problem Relation Age of Onset  ? CAD Mother   ? Cancer Maternal Aunt   ?     unk type  ? Leukemia Maternal Grandmother   ? Breast cancer Half-Sister   ?  dx 12s  ? ?Social History  ? ?Socioeconomic History  ? Marital status: Widowed  ?  Spouse name: Not on file  ? Number of children: Not on file  ? Years of education: Not on file  ? Highest education level: Not on file  ?Occupational History  ? Not on file  ?Tobacco Use  ? Smoking status: Former  ?  Packs/day: 1.50  ?  Years: 6.00  ?  Pack years: 9.00  ?  Types: Cigarettes  ?  Quit date: 48  ?  Years since quitting: 48.3  ? Smokeless tobacco: Never  ?Substance and Sexual Activity  ? Alcohol use: No  ?  Alcohol/week: 0.0 standard drinks  ? Drug use: No  ? Sexual activity: Not on file  ?Other Topics Concern  ? Not on file  ?Social History Narrative  ? Married   ? ?Social Determinants of Health  ? ?Financial Resource Strain: Low Risk   ? Difficulty of Paying Living  Expenses: Not hard at all  ?Food Insecurity: No Food Insecurity  ? Worried About Charity fundraiser in the Last Year: Never true  ? Ran Out of Food in the Last Year: Never true  ?Transportation Needs: No Transportation Needs  ? Lack of Transportation (Medical): No  ? Lack of Transportation (Non-Medical): No  ?Physical Activity: Not on file  ?Stress: No Stress Concern Present  ? Feeling of Stress : Not at all  ?Social Connections: Moderately Integrated  ? Frequency of Communication with Friends and Family: More than three times a week  ? Frequency of Social Gatherings with Friends and Family: More than three times a week  ? Attends Religious Services: More than 4 times per year  ? Active Member of Clubs or Organizations: Yes  ? Attends Archivist Meetings: More than 4 times per year  ? Marital Status: Widowed  ? ? ? ?Review of Systems  ?Constitutional:  Negative for appetite change and unexpected weight change.  ?HENT:  Negative for sinus pressure.   ?Respiratory:  Negative for cough, chest tightness and shortness of breath.   ?Cardiovascular:  Negative for chest pain, palpitations and leg swelling.  ?Gastrointestinal:  Negative for abdominal pain, diarrhea, nausea and vomiting.  ?Genitourinary:  Negative for difficulty urinating and dysuria.  ?Musculoskeletal:  Negative for joint swelling and myalgias.  ?Skin:  Negative for color change and rash.  ?Neurological:  Negative for dizziness, light-headedness and headaches.  ?Psychiatric/Behavioral:  Negative for agitation and dysphoric mood.   ? ?   ?Objective:  ?  ? ?BP 134/60 (BP Location: Left Arm, Patient Position: Sitting, Cuff Size: Small)   Pulse 62   Temp 97.6 ?F (36.4 ?C) (Temporal)   Resp 15   Ht '5\' 6"'$  (1.676 m)   Wt 112 lb 12.8 oz (51.2 kg)   SpO2 95%   BMI 18.21 kg/m?  ?Wt Readings from Last 3 Encounters:  ?02/14/22 112 lb 12.8 oz (51.2 kg)  ?02/12/22 112 lb 12.8 oz (51.2 kg)  ?01/23/22 110 lb (49.9 kg)  ? ? ?Physical Exam ?Vitals reviewed.   ?Constitutional:   ?   General: She is not in acute distress. ?   Appearance: Normal appearance.  ?HENT:  ?   Head: Normocephalic and atraumatic.  ?   Right Ear: External ear normal.  ?   Left Ear: External ear normal.  ?Eyes:  ?   General: No scleral icterus.    ?   Right eye: No discharge.     ?  Left eye: No discharge.  ?   Conjunctiva/sclera: Conjunctivae normal.  ?Neck:  ?   Thyroid: No thyromegaly.  ?Cardiovascular:  ?   Rate and Rhythm: Normal rate and regular rhythm.  ?Pulmonary:  ?   Effort: No respiratory distress.  ?   Breath sounds: Normal breath sounds. No wheezing.  ?Abdominal:  ?   General: Bowel sounds are normal.  ?   Palpations: Abdomen is soft.  ?   Tenderness: There is no abdominal tenderness.  ?Musculoskeletal:     ?   General: No swelling or tenderness.  ?   Cervical back: Neck supple. No tenderness.  ?Lymphadenopathy:  ?   Cervical: No cervical adenopathy.  ?Skin: ?   Findings: No erythema or rash.  ?Neurological:  ?   Mental Status: She is alert.  ?Psychiatric:     ?   Mood and Affect: Mood normal.     ?   Behavior: Behavior normal.  ? ? ? ?Outpatient Encounter Medications as of 02/12/2022  ?Medication Sig  ? amLODipine (NORVASC) 10 MG tablet TAKE 1 TABLET(10 MG) BY MOUTH DAILY  ? atorvastatin (LIPITOR) 40 MG tablet TAKE 1 TABLET(40 MG) BY MOUTH DAILY  ? calcium citrate-vitamin D (CITRACAL+D) 315-200 MG-UNIT tablet Take 1 tablet by mouth 2 (two) times daily.  ? ibandronate (BONIVA) 150 MG tablet Take 1 tablet (150 mg total) by mouth every 30 (thirty) days. Take in the morning with a full glass of water, on an empty stomach, and do not take anything else by mouth or lie down for the next 30 min.  ? levothyroxine (SYNTHROID) 137 MCG tablet Take 1 tablet (137 mcg total) by mouth daily before breakfast.  ? metoprolol succinate (TOPROL-XL) 50 MG 24 hr tablet TAKE 1 TABLET(50 MG) BY MOUTH EVERY DAY WITH OR IMMEDIATELY FOLLOWING A MEAL  ? Multiple Vitamin (MULTIVITAMIN) capsule Take 1 capsule by  mouth daily.  ? Multiple Vitamins-Minerals (PRESERVISION AREDS 2 PO) Take 1 tablet by mouth 2 (two) times daily.   ? Polyethyl Glycol-Propyl Glycol (SYSTANE) 0.4-0.3 % SOLN Place 1 drop into both eyes in the morning,

## 2022-02-13 ENCOUNTER — Other Ambulatory Visit: Payer: Self-pay

## 2022-02-13 ENCOUNTER — Emergency Department: Payer: Medicare Other

## 2022-02-13 ENCOUNTER — Inpatient Hospital Stay
Admission: EM | Admit: 2022-02-13 | Discharge: 2022-02-15 | DRG: 282 | Disposition: A | Discharge status: Home / Self Care | Payer: Medicare Other | Attending: Internal Medicine | Admitting: Internal Medicine

## 2022-02-13 DIAGNOSIS — E7800 Pure hypercholesterolemia, unspecified: Secondary | ICD-10-CM | POA: Diagnosis present

## 2022-02-13 DIAGNOSIS — J479 Bronchiectasis, uncomplicated: Secondary | ICD-10-CM | POA: Diagnosis present

## 2022-02-13 DIAGNOSIS — M810 Age-related osteoporosis without current pathological fracture: Secondary | ICD-10-CM | POA: Diagnosis present

## 2022-02-13 DIAGNOSIS — H3530 Unspecified macular degeneration: Secondary | ICD-10-CM | POA: Diagnosis present

## 2022-02-13 DIAGNOSIS — Z87891 Personal history of nicotine dependence: Secondary | ICD-10-CM | POA: Diagnosis not present

## 2022-02-13 DIAGNOSIS — E876 Hypokalemia: Secondary | ICD-10-CM | POA: Diagnosis not present

## 2022-02-13 DIAGNOSIS — Z955 Presence of coronary angioplasty implant and graft: Secondary | ICD-10-CM

## 2022-02-13 DIAGNOSIS — Z79890 Hormone replacement therapy (postmenopausal): Secondary | ICD-10-CM | POA: Diagnosis not present

## 2022-02-13 DIAGNOSIS — Z803 Family history of malignant neoplasm of breast: Secondary | ICD-10-CM

## 2022-02-13 DIAGNOSIS — Z8249 Family history of ischemic heart disease and other diseases of the circulatory system: Secondary | ICD-10-CM | POA: Diagnosis not present

## 2022-02-13 DIAGNOSIS — I214 Non-ST elevation (NSTEMI) myocardial infarction: Secondary | ICD-10-CM

## 2022-02-13 DIAGNOSIS — E785 Hyperlipidemia, unspecified: Secondary | ICD-10-CM | POA: Diagnosis present

## 2022-02-13 DIAGNOSIS — Z85828 Personal history of other malignant neoplasm of skin: Secondary | ICD-10-CM

## 2022-02-13 DIAGNOSIS — Z888 Allergy status to other drugs, medicaments and biological substances status: Secondary | ICD-10-CM

## 2022-02-13 DIAGNOSIS — Z79899 Other long term (current) drug therapy: Secondary | ICD-10-CM

## 2022-02-13 DIAGNOSIS — I2510 Atherosclerotic heart disease of native coronary artery without angina pectoris: Secondary | ICD-10-CM | POA: Diagnosis present

## 2022-02-13 DIAGNOSIS — I10 Essential (primary) hypertension: Secondary | ICD-10-CM | POA: Diagnosis present

## 2022-02-13 DIAGNOSIS — R079 Chest pain, unspecified: Secondary | ICD-10-CM | POA: Diagnosis not present

## 2022-02-13 DIAGNOSIS — E039 Hypothyroidism, unspecified: Secondary | ICD-10-CM | POA: Diagnosis present

## 2022-02-13 DIAGNOSIS — N2889 Other specified disorders of kidney and ureter: Secondary | ICD-10-CM | POA: Diagnosis present

## 2022-02-13 DIAGNOSIS — Z853 Personal history of malignant neoplasm of breast: Secondary | ICD-10-CM | POA: Diagnosis not present

## 2022-02-13 DIAGNOSIS — Z96659 Presence of unspecified artificial knee joint: Secondary | ICD-10-CM | POA: Diagnosis present

## 2022-02-13 HISTORY — DX: Non-ST elevation (NSTEMI) myocardial infarction: I21.4

## 2022-02-13 LAB — BASIC METABOLIC PANEL
Anion gap: 5 (ref 5–15)
BUN: 17 mg/dL (ref 8–23)
CO2: 35 mmol/L — ABNORMAL HIGH (ref 22–32)
Calcium: 9.4 mg/dL (ref 8.9–10.3)
Chloride: 100 mmol/L (ref 98–111)
Creatinine, Ser: 0.75 mg/dL (ref 0.44–1.00)
GFR, Estimated: >60 mL/min (ref >60)
Glucose, Bld: 120 mg/dL — ABNORMAL HIGH (ref 70–99)
Potassium: 3.6 mmol/L (ref 3.5–5.1)
Sodium: 140 mmol/L (ref 135–145)

## 2022-02-13 LAB — CBC
HCT: 48.1 % — ABNORMAL HIGH (ref 36.0–46.0)
Hemoglobin: 15.7 g/dL — ABNORMAL HIGH (ref 12.0–15.0)
MCH: 32.2 pg (ref 26.0–34.0)
MCHC: 32.6 g/dL (ref 30.0–36.0)
MCV: 98.6 fL (ref 80.0–100.0)
Platelets: 191 10*3/uL (ref 150–400)
RBC: 4.88 MIL/uL (ref 3.87–5.11)
RDW: 14.3 % (ref 11.5–15.5)
WBC: 5.8 10*3/uL (ref 4.0–10.5)
nRBC: 0 % (ref 0.0–0.2)

## 2022-02-13 LAB — PROTIME-INR
INR: 1.1 (ref 0.8–1.2)
Prothrombin Time: 13.8 seconds (ref 11.4–15.2)

## 2022-02-13 LAB — TROPONIN I (HIGH SENSITIVITY)
Troponin I (High Sensitivity): 86 ng/L — ABNORMAL HIGH (ref <18)
Troponin I (High Sensitivity): 442 ng/L (ref <18)

## 2022-02-13 LAB — APTT: aPTT: 30 seconds (ref 24–36)

## 2022-02-13 MED ORDER — POLYVINYL ALCOHOL 1.4 % OP SOLN
1.0000 [drp] | Freq: Three times a day (TID) | OPHTHALMIC | Status: DC
  Administered 2022-02-13 – 2022-02-15 (×4): 1 [drp] via OPHTHALMIC
  Filled 2022-02-13 (×2): qty 15

## 2022-02-13 MED ORDER — AMLODIPINE BESYLATE 10 MG PO TABS
10.0000 mg | ORAL_TABLET | Freq: Every day | ORAL | Status: DC
  Administered 2022-02-14 – 2022-02-15 (×2): 10 mg via ORAL
  Filled 2022-02-13: qty 1
  Filled 2022-02-13: qty 2

## 2022-02-13 MED ORDER — NITROGLYCERIN 0.4 MG SL SUBL: 0.4000 mg | SUBLINGUAL_TABLET | SUBLINGUAL | Status: DC | PRN

## 2022-02-13 MED ORDER — VIBEGRON 75 MG PO TABS: 75.0000 mg | ORAL_TABLET | Freq: Every day | ORAL | Status: DC

## 2022-02-13 MED ORDER — POLYETHYL GLYCOL-PROPYL GLYCOL 0.4-0.3 % OP SOLN: 1.0000 [drp] | Freq: Three times a day (TID) | OPHTHALMIC | Status: DC

## 2022-02-13 MED ORDER — LEVOTHYROXINE SODIUM 137 MCG PO TABS
137.0000 ug | ORAL_TABLET | Freq: Every day | ORAL | Status: DC
  Administered 2022-02-14 – 2022-02-15 (×2): 137 ug via ORAL
  Filled 2022-02-13 (×3): qty 1

## 2022-02-13 MED ORDER — ACETAMINOPHEN 325 MG PO TABS: 650.0000 mg | ORAL_TABLET | ORAL | Status: DC | PRN

## 2022-02-13 MED ORDER — ONDANSETRON HCL 4 MG/2ML IJ SOLN: 4.0000 mg | Freq: Four times a day (QID) | INTRAMUSCULAR | Status: DC | PRN

## 2022-02-13 MED ORDER — HEPARIN (PORCINE) 25000 UT/250ML-% IV SOLN
900.0000 [IU]/h | INTRAVENOUS | Status: AC
  Administered 2022-02-13: 600 [IU]/h via INTRAVENOUS
  Administered 2022-02-14: 900 [IU]/h via INTRAVENOUS
  Filled 2022-02-13 (×2): qty 250

## 2022-02-13 MED ORDER — HEPARIN BOLUS VIA INFUSION
3000.0000 [IU] | Freq: Once | INTRAVENOUS | Status: AC
Start: 2022-02-13 — End: 2022-02-13
  Administered 2022-02-13: 3000 [IU] via INTRAVENOUS
  Filled 2022-02-13: qty 3000

## 2022-02-13 MED ORDER — ATORVASTATIN CALCIUM 20 MG PO TABS
40.0000 mg | ORAL_TABLET | Freq: Every day | ORAL | Status: DC
  Administered 2022-02-13 – 2022-02-14 (×2): 40 mg via ORAL
  Filled 2022-02-13 (×2): qty 2

## 2022-02-13 MED ORDER — METOPROLOL TARTRATE 25 MG PO TABS
25.0000 mg | ORAL_TABLET | Freq: Two times a day (BID) | ORAL | Status: DC
  Administered 2022-02-13 – 2022-02-14 (×3): 25 mg via ORAL
  Filled 2022-02-13 (×3): qty 1

## 2022-02-13 MED ORDER — ASPIRIN 81 MG PO CHEW: 324.0000 mg | CHEWABLE_TABLET | ORAL | Status: AC

## 2022-02-13 MED ORDER — IRBESARTAN 150 MG PO TABS
150.0000 mg | ORAL_TABLET | Freq: Every day | ORAL | Status: DC
  Administered 2022-02-14: 150 mg via ORAL
  Filled 2022-02-13 (×2): qty 1

## 2022-02-13 MED ORDER — ASPIRIN EC 81 MG PO TBEC
81.0000 mg | DELAYED_RELEASE_TABLET | Freq: Every day | ORAL | Status: DC
  Administered 2022-02-14 – 2022-02-15 (×2): 81 mg via ORAL
  Filled 2022-02-13 (×2): qty 1

## 2022-02-13 MED ORDER — ASPIRIN 300 MG RE SUPP: 300.0000 mg | RECTAL | Status: AC

## 2022-02-13 NOTE — H&P (Signed)
?History and Physical  ? ? Alexa Pham QQV:[id number] DOB: 05-27-1930 DOA: 02/13/2022 ? ?PCP: Einar Pheasant, MD (Confirm with patient/family/NH records and if not entered, this has to be entered at [facility] point of entry) ?Patient coming from: Home ? ?I have personally briefly reviewed patient's old medical records in [place] ? ?Chief Complaint: Chest pain ? ?HPI: Alexa Pham is a 88 y.o. female with medical history significant of CAD with single-vessel disease status post stenting LCx 2021 off Plavix 6 months ago, HTN, hypothyroidism, HLD, osteoporosis, presented with new onset of chest pain. ? ?Patient was at her hairdresser to pick up something and she started to have mild pressure-like chest pain, which she is described as squeezing like, associated with shortness of breath, denies any nauseous vomiting palpitations sweating.  Pain persistent for about half an hour, subsided after given aspirin in the ED.   ? ?ED Course: No tachycardia no hypotension.  Troponin trending up 86>442, and EKG showed no significant ST-T changes ? ?Cardiology consulted and recommend to start heparin drip. ? ?Review of Systems: As per HPI otherwise 14 point review of systems negative.  ? ? ?Past Medical History:  ?Diagnosis Date  ? Actinic keratosis 01/28/2020  ? R thumb proximal nail fold   ? Atypical mole 05/13/2018  ? R distal lat thigh  ? Atypical mole 09/14/2014  ? left ant mid thigh  ? Atypical mole 01/19/2014  ? left paraspinal mid to upper back  ? Atypical mole 01/19/2014  ? right back mid to inf scapula  ? Atypical mole 01/19/2014  ? left posterior deltoid medial  ? Basal cell carcinoma 01/28/2020  ? left of midline mid dorsum nose  ? Basal cell carcinoma 01/06/2018  ? left preauricular   ? Basal cell carcinoma 11/22/2016  ? left upper lip  ? Basal cell carcinoma 03/17/2015  ? left lateral elbow  ? Basal cell carcinoma 07/22/2014  ? R medial inferior knee  ? Basal cell carcinoma 07/21/2020  ? Left dorsum hand. Nodular  pattern. Excised 08/09/2020, margins free.  ? Basal cell carcinoma 02/23/2021  ? right dorsal hand (BCC/SCC), left dorsal hand, right chin - treated with EDC  ? Family history of breast cancer   ? History of breast cancer   ? History of pneumonia   ? Hypertension   ? Hypothyroidism   ? Macular degeneration   ? Squamous cell carcinoma of skin 01/06/2018  ? right dorsum hand at ring finger MCP  ? Squamous cell carcinoma of skin 07/08/2017  ? left distal dorsum forearm   ? ? ?Past Surgical History:  ?Procedure Laterality Date  ? ABDOMINAL HYSTERECTOMY    ? ABDOMINAL HYSTERECTOMY    ? APPENDECTOMY    ? BREAST LUMPECTOMY    ? CATARACT EXTRACTION, BILATERAL    ? CHOLECYSTECTOMY    ? CORONARY STENT INTERVENTION N/A 07/26/2020  ? Procedure: CORONARY STENT INTERVENTION;  Surgeon: Isaias Cowman, MD;  Location: [facility];  Service: Cardiovascular;  Laterality: N/A;  ? LEFT HEART CATH AND CORONARY ANGIOGRAPHY Left 07/26/2020  ? Procedure: LEFT HEART CATH AND CORONARY ANGIOGRAPHY;  Surgeon: Corey Skains, MD;  Location: [facility];  Service: Cardiovascular;  Laterality: Left;  ? REPLACEMENT TOTAL KNEE    ? TONSILLECTOMY AND ADENOIDECTOMY    ? ? ? reports that she quit smoking about 48 years ago. Her smoking use included cigarettes. She has a 9.00 pack-year smoking history. She has never used smokeless tobacco. She reports that she  does not drink alcohol and does not use drugs. ? ?Allergies  ?Allergen Reactions  ? Ticagrelor Shortness Of Breath  ?  Extreme   ? ? ?Family History  ?Problem Relation Age of Onset  ? CAD Mother   ? Cancer Maternal Aunt   ?     unk type  ? Leukemia Maternal Grandmother   ? Breast cancer Half-Sister   ?     dx 49s  ? ? ? ?Prior to Admission medications   ?Medication Sig Start Date End Date Taking? Authorizing Provider  ?amLODipine (NORVASC) 10 MG tablet TAKE 1 TABLET(10 MG) BY MOUTH DAILY 02/07/22  Yes Einar Pheasant, MD  ?atorvastatin (LIPITOR) 40 MG tablet TAKE 1  TABLET(40 MG) BY MOUTH DAILY 12/26/20  Yes Einar Pheasant, MD  ?calcium citrate-vitamin D (CITRACAL+D) 315-200 MG-UNIT tablet Take 1 tablet by mouth 2 (two) times daily.   Yes [provider]  ?irbesartan (AVAPRO) 150 MG tablet Take 150 mg by mouth daily. 09/20/20 02/13/22 Yes [provider]  ?levothyroxine (SYNTHROID) 137 MCG tablet Take 1 tablet (137 mcg total) by mouth daily before breakfast. 01/08/22  Yes Einar Pheasant, MD  ?metoprolol succinate (TOPROL-XL) 50 MG 24 hr tablet TAKE 1 TABLET(50 MG) BY MOUTH EVERY DAY WITH OR IMMEDIATELY FOLLOWING A MEAL 12/26/21  Yes Kennyth Arnold, FNP  ?Multiple Vitamin (MULTIVITAMIN) capsule Take 1 capsule by mouth daily.   Yes [provider]  ?Multiple Vitamins-Minerals (PRESERVISION AREDS 2 PO) Take 1 tablet by mouth 2 (two) times daily.    Yes [provider]  ?Polyethyl Glycol-Propyl Glycol (SYSTANE) 0.4-0.3 % SOLN Place 1 drop into both eyes in the morning, at noon, and at bedtime.   Yes [provider]  ?Vibegron (GEMTESA) 75 MG TABS Take 75 mg by mouth daily. 08/14/21  Yes Hollice Espy, MD  ?ibandronate (BONIVA) 150 MG tablet Take 1 tablet (150 mg total) by mouth every 30 (thirty) days. Take in the morning with a full glass of water, on an empty stomach, and do not take anything else by mouth or lie down for the next 30 min. 02/12/22   Einar Pheasant, MD  ? ? ?Physical Exam: ?Vitals:  ? 02/13/22 1101 02/13/22 1439  ?BP: (!) 176/67 (!) 177/72  ?Pulse: 64 68  ?Resp: 16 18  ?Temp: 97.6 ?F (36.4 ?C) (!) 97.5 ?F (36.4 ?C)  ?TempSrc:  Oral  ?SpO2: 99% 99%  ? ? ?Constitutional: NAD, calm, comfortable ?Vitals:  ? 02/13/22 1101 02/13/22 1439  ?BP: (!) 176/67 (!) 177/72  ?Pulse: 64 68  ?Resp: 16 18  ?Temp: 97.6 ?F (36.4 ?C) (!) 97.5 ?F (36.4 ?C)  ?TempSrc:  Oral  ?SpO2: 99% 99%  ? ?Eyes: PERRL, lids and conjunctivae normal ?ENMT: Mucous membranes are moist. Posterior pharynx clear of any exudate or lesions.Normal dentition.  ?Neck:  normal, supple, no masses, no thyromegaly ?Respiratory: clear to auscultation bilaterally, no wheezing, no crackles. Normal respiratory effort. No accessory muscle use.  ?Cardiovascular: Regular rate and rhythm, no murmurs / rubs / gallops. No extremity edema. 2+ pedal pulses. No carotid bruits.  ?Abdomen: no tenderness, no masses palpated. No hepatosplenomegaly. Bowel sounds positive.  ?Musculoskeletal: no clubbing / cyanosis. No joint deformity upper and lower extremities. Good ROM, no contractures. Normal muscle tone.  ?Skin: no rashes, lesions, ulcers. No induration ?Neurologic: CN 2-12 grossly intact. Sensation intact, DTR normal. Strength 5/5 in all 4.  ?Psychiatric: Normal judgment and insight. Alert and oriented x 3. Normal mood.  ? ? ? ?Labs on  Admission: I have personally reviewed following labs and imaging studies ? ?CBC: ?Recent Labs  ?Lab 02/13/22 ?1039  ?WBC 5.8  ?HGB 15.7*  ?HCT 48.1*  ?MCV 98.6  ?PLT 191  ? ?Basic Metabolic Panel: ?Recent Labs  ?Lab 02/13/22 ?1039  ?NA 140  ?K 3.6  ?CL 100  ?CO2 35*  ?GLUCOSE 120*  ?BUN 17  ?CREATININE 0.75  ?CALCIUM 9.4  ? ?GFR: ?Estimated Creatinine Clearance: 37.8 mL/min (by C-G formula based on SCr of 0.75 mg/dL). ?Liver Function Tests: ?No results for input(s): AST, ALT, ALKPHOS, BILITOT, PROT, ALBUMIN in the last 168 hours. ?No results for input(s): LIPASE, AMYLASE in the last 168 hours. ?No results for input(s): AMMONIA in the last 168 hours. ?Coagulation Profile: ?No results for input(s): INR, PROTIME in the last 168 hours. ?Cardiac Enzymes: ?No results for input(s): CKTOTAL, CKMB, CKMBINDEX, TROPONINI in the last 168 hours. ?BNP (last 3 results) ?No results for input(s): PROBNP in the last 8760 hours. ?HbA1C: ?No results for input(s): HGBA1C in the last 72 hours. ?CBG: ?No results for input(s): GLUCAP in the last 168 hours. ?Lipid Profile: ?No results for input(s): CHOL, HDL, LDLCALC, TRIG, CHOLHDL, LDLDIRECT in the last 72 hours. ?Thyroid Function  Tests: ?No results for input(s): TSH, T4TOTAL, FREET4, T3FREE, THYROIDAB in the last 72 hours. ?Anemia Panel: ?No results for input(s): VITAMINB12, FOLATE, FERRITIN, TIBC, IRON, RETICCTPCT in the last 72 hours. ?Urine ana

## 2022-02-13 NOTE — ED Triage Notes (Signed)
Pt comes via EMs with c/o CP that started today. Pt states she had pressure earlier and then went to hair salon. Pt states 1/10 pain. Pt given 324 aspirin. ? ?BP-180/82 ?HR-64 ?O2-96% RA ?

## 2022-02-13 NOTE — ED Notes (Signed)
Pt taken to xray at this time. Will perform protocol once pt returns ? ?

## 2022-02-13 NOTE — Consult Note (Addendum)
[facility] CARDIOLOGY CONSULT NOTE       [name] MRN: [id number] DOB/AGE: 86-Jan-1931 86 y.o.  Admit date: 02/13/2022 Referring Physician Dr. Jene Every Primary Physician Dr. Dale Badin Primary Cardiologist Dr. Gwen Pounds  Reason for Consultation NSTEMI  HPI: Alexa Pham is a 88yoF with a PMH of three-vessel CAD with PCI of prox LCx 2021, hypertension, hyperlipidemia, hypothyroidism, bronchiectasis, history of left breast cancer s/p lumpectomy 1995, left renal mass suspicious for carcinoma (stable on repeat imaging, followed by urology), who presented to [facility] 5/9  with chest pain. Cardiology is consulted for further assistance.   Patient states this afternoon she was driving to a hair appointment this afternoon when she started experiencing tightness in her chest.  She said once she got to the hairdressers she sat and waited for a few minutes to see if the pain would resolve, but the pain got a little worse so she decided to call EMS because she "just knew something was not right."  She rated the chest tightness at worst a 5 out of 10, but denies radiation, exertional symptoms, worsening shortness of breath, diaphoresis, nausea, vomiting.  She says the tightness improved when she got aspirin in the ER and she denies recurrence of her chest tightness since then. she says she has never had chest tightness like this before.  Per chart review, prior to her stent placement in 2021 she had an abnormal stress echocardiogram which prompted her heart catheterization and subsequent discovery of her coronary artery disease.  She has chronic bilateral lower extremity dependent edema, resolves when she elevates her legs at night and she says is not much worse than usual.  Other cardiac review systems are negative for palpitations, dizziness, orthopnea.  She stays very active, most recently "walking the mall" 4 days ago, reaching her goal of 5000 steps.  She has some chronic exertional  dyspnea that is no worse than baseline, she says she walks thousand steps and then takes a few minute break to catch her breath, then continues on.  Notably, she denied any of this chest tightness she experienced today during any of her recent walks.  Vitals are notable for an elevated blood pressure to 177/72, heart rate 68 with frequent premature atrial contractions on telemetry.  Labs are notable for a potassium of 3.6, BUN/creatinine 17/0.75.  GFR greater than 60.  High-sensitivity troponin uptrending 86-4 42 with repeats pending.  H&H 15.7/48.1.  Platelets 191.  Chest x-ray negative for acute pulmonary process.  Review of systems complete and found to be negative unless listed above     Past Medical History:  Diagnosis Date   Actinic keratosis 01/28/2020   R thumb proximal nail fold    Atypical mole 05/13/2018   R distal lat thigh   Atypical mole 09/14/2014   left ant mid thigh   Atypical mole 01/19/2014   left paraspinal mid to upper back   Atypical mole 01/19/2014   right back mid to inf scapula   Atypical mole 01/19/2014   left posterior deltoid medial   Basal cell carcinoma 01/28/2020   left of midline mid dorsum nose   Basal cell carcinoma 01/06/2018   left preauricular    Basal cell carcinoma 11/22/2016   left upper lip   Basal cell carcinoma 03/17/2015   left lateral elbow   Basal cell carcinoma 07/22/2014   R medial inferior knee   Basal cell carcinoma 07/21/2020   Left dorsum hand. Nodular pattern. Excised 08/09/2020, margins free.  Basal cell carcinoma 02/23/2021   right dorsal hand (BCC/SCC), left dorsal hand, right chin - treated with EDC   Family history of breast cancer    History of breast cancer    History of pneumonia    Hypertension    Hypothyroidism    Macular degeneration    Squamous cell carcinoma of skin 01/06/2018   right dorsum hand at ring finger MCP   Squamous cell carcinoma of skin 07/08/2017   left distal dorsum forearm     Past Surgical  History:  Procedure Laterality Date   ABDOMINAL HYSTERECTOMY     ABDOMINAL HYSTERECTOMY     APPENDECTOMY     BREAST LUMPECTOMY     CATARACT EXTRACTION, BILATERAL     CHOLECYSTECTOMY     CORONARY STENT INTERVENTION N/A 07/26/2020   Procedure: CORONARY STENT INTERVENTION;  Surgeon: Marcina Millard, MD;  Location: [facility];  Service: Cardiovascular;  Laterality: N/A;   LEFT HEART CATH AND CORONARY ANGIOGRAPHY Left 07/26/2020   Procedure: LEFT HEART CATH AND CORONARY ANGIOGRAPHY;  Surgeon: Lamar Blinks, MD;  Location: [facility];  Service: Cardiovascular;  Laterality: Left;   REPLACEMENT TOTAL KNEE     TONSILLECTOMY AND ADENOIDECTOMY      (Not in a hospital admission)  Social History   Socioeconomic History   Marital status: Widowed    Spouse name: Not on file   Number of children: Not on file   Years of education: Not on file   Highest education level: Not on file  Occupational History   Not on file  Tobacco Use   Smoking status: Former    Packs/day: 1.50    Years: 6.00    Pack years: 9.00    Types: Cigarettes    Quit date: 19    Years since quitting: 48.3   Smokeless tobacco: Never  Substance and Sexual Activity   Alcohol use: No    Alcohol/week: 0.0 standard drinks   Drug use: No   Sexual activity: Not on file  Other Topics Concern   Not on file  Social History Narrative   Married    Social Determinants of Health   Financial Resource Strain: Low Risk    Difficulty of Paying Living Expenses: Not hard at all  Food Insecurity: No Food Insecurity   Worried About Programme researcher, broadcasting/film/video in the Last Year: Never true   Barista in the Last Year: Never true  Transportation Needs: No Transportation Needs   Lack of Transportation (Medical): No   Lack of Transportation (Non-Medical): No  Physical Activity: Not on file  Stress: No Stress Concern Present   Feeling of Stress : Not at all  Social Connections: Moderately Integrated    Frequency of Communication with Friends and Family: More than three times a week   Frequency of Social Gatherings with Friends and Family: More than three times a week   Attends Religious Services: More than 4 times per year   Active Member of [employer] or Organizations: Yes   Attends Banker Meetings: More than 4 times per year   Marital Status: Widowed  Catering manager Violence: Not At Risk   Fear of Current or Ex-Partner: No   Emotionally Abused: No   Physically Abused: No   Sexually Abused: No    Family History  Problem Relation Age of Onset   CAD Mother    Cancer Maternal Aunt        unk type  Leukemia Maternal Grandmother    Breast cancer Half-Sister        dx 40s      PHYSICAL EXAM General: Pleasant conversational elderly and thin Caucasian female, well nourished, in no acute distress.  Sitting upright in ED stretcher with son at bedside HEENT:  Normocephalic and atraumatic. Tip of nose with pea sized flesh colored ulcerated lesion Neck:  No JVD.  Lungs: Normal respiratory effort on room air.  Scattered crackles  heart: HRRR with frequent premature beats. Normal S1 and S2 without gallops or murmurs. Radial & DP pulses 2+ bilaterally. Abdomen: Non-distended appearing.  Msk: Normal strength and tone for age. Extremities: Warm and well perfused. No clubbing, cyanosis.  Trace bilateral lower extremity edema right slightly >left .  Neuro: Alert and oriented X 3. Psych:  Answers questions appropriately.   Labs:   Lab Results  Component Value Date   WBC 5.8 02/13/2022   HGB 15.7 (H) 02/13/2022   HCT 48.1 (H) 02/13/2022   MCV 98.6 02/13/2022   PLT 191 02/13/2022    Recent Labs  Lab 02/13/22 1039  NA 140  K 3.6  CL 100  CO2 35*  BUN 17  CREATININE 0.75  CALCIUM 9.4  GLUCOSE 120*   No results found for: CKTOTAL, CKMB, CKMBINDEX, TROPONINI  Lab Results  Component Value Date   CHOL 143 01/02/2022   CHOL 127 06/08/2020   CHOL 175 05/05/2020   Lab  Results  Component Value Date   HDL 65.60 01/02/2022   HDL 61.70 06/08/2020   HDL 61 05/05/2020   Lab Results  Component Value Date   LDLCALC 64 01/02/2022   LDLCALC 53 06/08/2020   LDLCALC 103 (H) 05/05/2020   Lab Results  Component Value Date   TRIG 68.0 01/02/2022   TRIG 60.0 06/08/2020   TRIG 57 05/05/2020   Lab Results  Component Value Date   CHOLHDL 2 01/02/2022   CHOLHDL 2 06/08/2020   CHOLHDL 2.9 05/05/2020   No results found for: LDLDIRECT    Radiology: DG Chest 2 View  Result Date: 02/13/2022 CLINICAL DATA:  Chest pain EXAM: CHEST - 2 VIEW COMPARISON:  07/11/2021 FINDINGS: Normal cardiac and mediastinal contours. Aortic atherosclerosis. Redemonstrated sequela of chronic lung disease, without acute focal pulmonary opacity. No pleural effusion or pneumothorax. Surgical clips in the left axilla. No acute osseous abnormality. IMPRESSION: No acute cardiopulmonary process. Electronically Signed   By: Wiliam Ke M.D.   On: 02/13/2022 11:07   DEXAScan  Result Date: 01/15/2022 EXAM: DUAL X-RAY ABSORPTIOMETRY (DXA) FOR BONE MINERAL DENSITY IMPRESSION: Your patient Alexa Pham completed a BMD test on 01/15/2022 using the [employer] DXA System (software version: 14.10) manufactured by [employer]. The following summarizes the results of our evaluation. Technologist: [facility] PATIENT BIOGRAPHICAL: Name: Amaurie, Chamberland Patient ID: [id number] Birth Date: 05/07/1930 Height: 66.0 in. Gender: Female Exam Date: 01/15/2022 Weight: 112.8 lbs. Indications: Advanced Age, Caucasian, Hypothyroid, Hysterectomy, Postmenopausal Fractures: Treatments: Calcium, Synthroid, Vitamin D DENSITOMETRY RESULTS: Site         Region     Measured Date Measured Age WHO Classification Young Adult T-score BMD         %Change vs. Previous Significant Change (*) DualFemur Total Left 01/15/2022 90.2 Osteopenia -1.6 0.808 g/cm2 -1.6% - DualFemur Total Left 08/21/2018 86.8 Osteopenia -1.5 0.821 g/cm2 - - DualFemur  Total Mean 01/15/2022 90.2 Osteopenia -1.4 0.829 g/cm2 -1.1% - DualFemur Total Mean 08/21/2018 86.8 Osteopenia -1.4 0.838 g/cm2 - - Left Forearm Radius 33% 01/15/2022  90.2 Osteopenia -2.4 0.669 g/cm2 1.1% - Left Forearm Radius 33% 08/21/2018 86.8 Osteopenia -2.4 0.662 g/cm2 - - ASSESSMENT: The BMD measured at Forearm Radius 33% is 0.669 g/cm2 with a T-score of -2.4. This patient is considered osteopenic according to World Health Organization [facility]) criteria. The scan quality is good. Lumbar spine was not utilized due to advanced degenerative changes. Compared with prior study, there has been no significant change in the total hip. World Science writer [facility]) criteria for post-menopausal, Caucasian Women: Normal:                   T-score at or above -1 SD Osteopenia/low bone mass: T-score between -1 and -2.5 SD Osteoporosis:             T-score at or below -2.5 SD RECOMMENDATIONS: 1. All patients should optimize calcium and vitamin D intake. 2. Consider FDA-approved medical therapies in postmenopausal women and men aged 49 years and older, based on the following: a. A hip or vertebral(clinical or morphometric) fracture b. T-score < -2.5 at the femoral neck or spine after appropriate evaluation to exclude secondary causes c. Low bone mass (T-score between -1.0 and -2.5 at the femoral neck or spine) and a 10-year probability of a hip fracture > 3% or a 10-year probability of a major osteoporosis-related fracture > 20% based on the [place]-adapted WHO algorithm 3. Clinician judgment and/or patient preferences may indicate treatment for people with 10-year fracture probabilities above or below these levels FOLLOW-UP: People with diagnosed cases of osteoporosis or at high risk for fracture should have regular bone mineral density tests. For patients eligible for Medicare, routine testing is allowed once every 2 years. The testing frequency can be increased to one year for patients who have rapidly progressing disease,  those who are receiving or discontinuing medical therapy to restore bone mass, or have additional risk factors. I have reviewed this report, and agree with the above findings. Mark A. Tyron Russell, M.D. [facility] Electronically Signed   By: Ulyses Southward M.D.   On: 01/15/2022 16:41    ECHO LVEF greater than 55%, mild AR, MR, TR, trivial PR 06/28/2020  TELEMETRY reviewed by me: Sinus rhythm with frequent premature atrial contractions, rate 70s  EKG reviewed by me and Dr. Gwen Pounds : Sinus rhythm with a premature atrial contraction, rate 67 with poor R wave progression.  ASSESSMENT AND PLAN:  Alexa Pham is a 88yoF with a PMH of three-vessel CAD with PCI of prox LCx 2021, hypertension, hyperlipidemia, hypothyroidism, bronchiectasis, history of left breast cancer s/p lumpectomy 1995, left renal mass suspicious for carcinoma (stable on repeat imaging, followed by urology), who presented to [facility] 5/9  with chest pain. Cardiology is consulted for further assistance.   #NSTEMI #Three-vessel CAD s/p PCI to proximal LCx 2021 The patient presents with nonexertional centralized chest tightness that occurred while she was driving to the hairdresser this afternoon.  Pain was at worst a 5 out of 10, nonexertional, nonradiating, and resolved with administration of aspirin in the ED and had not reoccurred since.  Troponin is uptrending from 86-442, but EKG is without acute ischemic changes. Notably her blood pressure was rather elevated to 177/72 despite taking her home medications this morning.  She also is very active, notably walking 5000 steps 4 days ago without chest discomfort or similar tightness that she experienced today.   -S/p 325 mg aspirin, continue 81 mg aspirin daily -Continue heparin drip for 48 hours -Repeat echocardiogram complete, further recommendations based on  its results -Continuous monitoring on telemetry -Trend troponin until peak -Continue metoprolol tartrate 25 mg twice daily  (likely consolidate to metoprolol XL discharge) -Continue atorvastatin 40 mg -As needed SL nitroglycerin for recurrent chest discomfort, repeat EKG -Continue other home medications which include Irbesartan 150 mg daily, amlodipine 10 mg daily -Recommend conservative management from a cardiac standpoint for now  #Hypertension #Hyperlipidemia Medications as above.  This patient's plan of care was discussed and created with Dr. Gwen Pounds and he is in agreement.  Signed: Rebeca Allegra , PA-C 02/13/2022, 3:59 PM [facility]  The patient has been doing well from the cardiovascular standpoint but had not been taking the antiplatelet medication management which could have influenced her current condition due to the fact that she claims that the aspirin was the primary reason for total relief of her symptoms.  Further evaluation and treatment options will ensue and the patient did have a discussion possible intervention if necessary depending on the next 24 hours  The patient has been interviewed and examined. I agree with assessment and plan above. Arnoldo Hooker MD [facility]

## 2022-02-13 NOTE — ED Provider Notes (Signed)
? ?[facility] ?Provider Note ? ? ? Event Date/Time  ? First MD Initiated Contact with Patient 02/13/22 1428   ?  (approximate) ? ? ?History  ? ?Chest Pain ? ? ?HPI ? ?Alexa Pham is a 88 y.o. female with history of hypertension, CAD who presents with complaints of chest pressure.  Patient reports around 9 AM this morning she was driving to the hairdresser and developed a pressure-like sensation in her chest which was very mild.  She decided to come to the emergency department but reports symptoms have resolved.  She denies shortness of breath.  No fevers or chills.  No leg swelling or pain ?  ? ? ?Physical Exam  ? ?Triage Vital Signs: ?ED Triage Vitals  ?Enc Vitals Group  ?   BP 02/13/22 1101 (!) 176/67  ?   Pulse Rate 02/13/22 1101 64  ?   Resp 02/13/22 1101 16  ?   Temp 02/13/22 1101 97.6 ?F (36.4 ?C)  ?   Temp Source 02/13/22 1439 Oral  ?   SpO2 02/13/22 1101 99 %  ?   Weight --   ?   Height --   ?   Head Circumference --   ?   Peak Flow --   ?   Pain Score 02/13/22 1038 1  ?   Pain Loc --   ?   Pain Edu? --   ?   Excl. in [place]? --   ? ? ?Most recent vital signs: ?Vitals:  ? 02/13/22 1101 02/13/22 1439  ?BP: (!) 176/67 (!) 177/72  ?Pulse: 64 68  ?Resp: 16 18  ?Temp: 97.6 ?F (36.4 ?C) (!) 97.5 ?F (36.4 ?C)  ?SpO2: 99% 99%  ? ? ? ?General: Awake, no distress.  ?CV:  Good peripheral perfusion.  ?Resp:  Normal effort.  ?Abd:  No distention.  ?Other:  No lower extremity swelling or pain ? ? ?ED Results / Procedures / Treatments  ? ?Labs ?(all labs ordered are listed, but only abnormal results are displayed) ?Labs Reviewed  ?BASIC METABOLIC PANEL - Abnormal; Notable for the following components:  ?    Result Value  ? CO2 35 (*)   ? Glucose, Bld 120 (*)   ? All other components within normal limits  ?CBC - Abnormal; Notable for the following components:  ? Hemoglobin 15.7 (*)   ? HCT 48.1 (*)   ? All other components within normal limits  ?TROPONIN I (HIGH SENSITIVITY) - Abnormal; Notable for the  following components:  ? Troponin I (High Sensitivity) 86 (*)   ? All other components within normal limits  ?TROPONIN I (HIGH SENSITIVITY) - Abnormal; Notable for the following components:  ? Troponin I (High Sensitivity) 442 (*)   ? All other components within normal limits  ? ? ? ?EKG ? ?ED ECG REPORT ?I, Lavonia Drafts, the attending physician, personally viewed and interpreted this ECG. ? ?Date: 02/13/2022 ? ?Rhythm: normal sinus rhythm ?QRS Axis: normal ?Intervals: normal ?ST/T Wave abnormalities: Nonspecific changes ?Narrative Interpretation: no evidence of acute ischemia ? ? ? ?RADIOLOGY ?Chest x-ray interpreted by me, no acute abnormality ? ? ? ?PROCEDURES: ? ?Critical Care performed: yes ? ?CRITICAL CARE ?Performed by: Lavonia Drafts ? ? ?Total critical care time: 30  minutes ? ?Critical care time was exclusive of separately billable procedures and treating other patients. ? ?Critical care was necessary to treat or prevent imminent or life-threatening deterioration. ? ?Critical care was time spent personally by me on the following  activities: development of treatment plan with patient and/or surrogate as well as nursing, discussions with consultants, evaluation of patient's response to treatment, examination of patient, obtaining history from patient or surrogate, ordering and performing treatments and interventions, ordering and review of laboratory studies, ordering and review of radiographic studies, pulse oximetry and re-evaluation of patient's condition. ? ? ?Procedures ? ? ?MEDICATIONS ORDERED IN ED: ?Medications - No data to display ? ? ?IMPRESSION / MDM / ASSESSMENT AND PLAN / ED COURSE  ?I reviewed the triage vital signs and the nursing notes. ? ?Patient presents with acute illness that poses a threat to life or bodily function. ? ?Patient with chest discomfort as above, she has a history of CAD. ? ?Differential includes ACS, angina, arrhythmia ? ?Symptoms have resolved in the emergency department  however her initial troponin was elevated at 86, initial EKG compared to prior EKG, no significant changes ?----------------------------------------- ?2:44 PM on 02/13/2022 ?----------------------------------------- ? ?Second troponin is more significantly elevated at 442, I suspect the patient has had an NSTEMI, she remains pain-free at this time ? ?I will consult Dr. Nehemiah Massed which is her cardiologist ? ?Will start the patient on heparin GTT, have consulted the hospitalist for admission ? ? ? ? ? ?  ? ? ?FINAL CLINICAL IMPRESSION(S) / ED DIAGNOSES  ? ?Final diagnoses:  ?NSTEMI (non-ST elevated myocardial infarction) ([place])  ? ? ? ?Rx / DC Orders  ? ?ED Discharge Orders   ? ? None  ? ?  ? ? ? ?Note:  This document was prepared using Dragon voice recognition software and may include unintentional dictation errors. ?  ?Lavonia Drafts, MD ?02/13/22 1533 ? ?

## 2022-02-13 NOTE — Consult Note (Signed)
ANTICOAGULATION CONSULT NOTE - Initial Consult ? ?Pharmacy Consult for Heparin infusion ?Indication: chest pain/ACS ? ?Allergies  ?Allergen Reactions  ? Ticagrelor Shortness Of Breath  ?  Extreme   ? ? ?Patient Measurements: ?Wt 51.2kg ?Ht 167.6cm (5'6") ?IBW  59.3 kg ?TBW 51.2kg ?Heparin Dosing Weight: 51.2 kg ? ?Vital Signs: ?Temp: 97.5 ?F (36.4 ?C) (05/09 1439) ?Temp Source: Oral (05/09 1439) ?BP: 177/72 (05/09 1439) ?Pulse Rate: 68 (05/09 1439) ? ?Labs: ?Recent Labs  ?  02/13/22 ?1039 02/13/22 ?1341  ?HGB 15.7*  --   ?HCT 48.1*  --   ?PLT 191  --   ?CREATININE 0.75  --   ?TROPONINIHS 86* 442*  ? ?Estimated Creatinine Clearance: 37.8 mL/min (by C-G formula based on SCr of 0.75 mg/dL). ? ? ?Medical History: ?Past Medical History:  ?Diagnosis Date  ? Actinic keratosis 01/28/2020  ? R thumb proximal nail fold   ? Atypical mole 05/13/2018  ? R distal lat thigh  ? Atypical mole 09/14/2014  ? left ant mid thigh  ? Atypical mole 01/19/2014  ? left paraspinal mid to upper back  ? Atypical mole 01/19/2014  ? right back mid to inf scapula  ? Atypical mole 01/19/2014  ? left posterior deltoid medial  ? Basal cell carcinoma 01/28/2020  ? left of midline mid dorsum nose  ? Basal cell carcinoma 01/06/2018  ? left preauricular   ? Basal cell carcinoma 11/22/2016  ? left upper lip  ? Basal cell carcinoma 03/17/2015  ? left lateral elbow  ? Basal cell carcinoma 07/22/2014  ? R medial inferior knee  ? Basal cell carcinoma 07/21/2020  ? Left dorsum hand. Nodular pattern. Excised 08/09/2020, margins free.  ? Basal cell carcinoma 02/23/2021  ? right dorsal hand (BCC/SCC), left dorsal hand, right chin - treated with EDC  ? Family history of breast cancer   ? History of breast cancer   ? History of pneumonia   ? Hypertension   ? Hypothyroidism   ? Macular degeneration   ? Squamous cell carcinoma of skin 01/06/2018  ? right dorsum hand at ring finger MCP  ? Squamous cell carcinoma of skin 07/08/2017  ? left distal dorsum forearm    ? ? ?Medications:  ?(Not in a hospital admission)  ? ?Assessment: ?88yo female with PMH significant for HTN, CAD, hx of breast cancer, HLD, and HTN. Patient presents to ED with chest pain. Pharmacy consulted to manage heparin gtt for NSTEMI.  ? ?CBC and BMP within normal limits. CrCl 37.8 ml/min. Serial troponin 86>>442.  Baseline INR and aPTT ordered prior to initiating heparin.  ? ?No AC noted prior to admission.  ? ?Goal of Therapy:  ?Heparin level 0.3-0.7 units/ml ?Monitor platelets by anticoagulation protocol: Yes ?  ?Plan:  ?Give 3000 units bolus x 1 ?Start heparin infusion at 600 units/hr ?Check anti-Xa level in 8 hours and daily while on heparin ?Continue to monitor H&H and platelets ? ?Rabecka Brendel Rodriguez-Guzman PharmD, BCPS ?02/13/2022 3:45 PM ? ? ? ?

## 2022-02-14 ENCOUNTER — Encounter: Payer: Self-pay | Admitting: Internal Medicine

## 2022-02-14 ENCOUNTER — Inpatient Hospital Stay
Admit: 2022-02-14 | Discharge: 2022-02-14 | Disposition: A | Discharge status: Home / Self Care | Payer: Medicare Other | Attending: Cardiology | Admitting: Cardiology

## 2022-02-14 ENCOUNTER — Other Ambulatory Visit: Payer: Self-pay

## 2022-02-14 DIAGNOSIS — I214 Non-ST elevation (NSTEMI) myocardial infarction: Secondary | ICD-10-CM | POA: Diagnosis not present

## 2022-02-14 LAB — CBC
HCT: 40.1 % (ref 36.0–46.0)
Hemoglobin: 12.9 g/dL (ref 12.0–15.0)
MCH: 30.9 pg (ref 26.0–34.0)
MCHC: 32.2 g/dL (ref 30.0–36.0)
MCV: 96.2 fL (ref 80.0–100.0)
Platelets: 170 10*3/uL (ref 150–400)
RBC: 4.17 MIL/uL (ref 3.87–5.11)
RDW: 14.3 % (ref 11.5–15.5)
WBC: 5.7 10*3/uL (ref 4.0–10.5)
nRBC: 0 % (ref 0.0–0.2)

## 2022-02-14 LAB — ECHOCARDIOGRAM COMPLETE
AR max vel: 1.84 cm2
AV Area VTI: 2.13 cm2
AV Area mean vel: 1.77 cm2
AV Mean grad: 4.5 mmHg
AV Peak grad: 8.4 mmHg
Ao pk vel: 1.45 m/s
Area-P 1/2: 3.68 cm2
MV VTI: 2.34 cm2
P 1/2 time: 547 msec
S' Lateral: 2.9 cm

## 2022-02-14 LAB — LIPID PANEL
Cholesterol: 126 mg/dL (ref 0–200)
HDL: 63 mg/dL (ref >40)
LDL Cholesterol: 57 mg/dL (ref 0–99)
Total CHOL/HDL Ratio: 2 RATIO
Triglycerides: 28 mg/dL (ref <150)
VLDL: 6 mg/dL (ref 0–40)

## 2022-02-14 LAB — BASIC METABOLIC PANEL
Anion gap: 4 — ABNORMAL LOW (ref 5–15)
BUN: 16 mg/dL (ref 8–23)
CO2: 35 mmol/L — ABNORMAL HIGH (ref 22–32)
Calcium: 8.4 mg/dL — ABNORMAL LOW (ref 8.9–10.3)
Chloride: 104 mmol/L (ref 98–111)
Creatinine, Ser: 0.71 mg/dL (ref 0.44–1.00)
GFR, Estimated: >60 mL/min (ref >60)
Glucose, Bld: 91 mg/dL (ref 70–99)
Potassium: 3.2 mmol/L — ABNORMAL LOW (ref 3.5–5.1)
Sodium: 143 mmol/L (ref 135–145)

## 2022-02-14 LAB — TROPONIN I (HIGH SENSITIVITY)
Troponin I (High Sensitivity): 2071 ng/L (ref <18)
Troponin I (High Sensitivity): 1836 ng/L (ref <18)

## 2022-02-14 LAB — HEMOGLOBIN A1C
Hgb A1c MFr Bld: 5.6 % (ref 4.8–5.6)
Mean Plasma Glucose: 114.02 mg/dL

## 2022-02-14 LAB — HEPARIN LEVEL (UNFRACTIONATED)
Heparin Unfractionated: 0.23 IU/mL — ABNORMAL LOW (ref 0.30–0.70)
Heparin Unfractionated: 0.36 IU/mL (ref 0.30–0.70)
Heparin Unfractionated: 0.12 IU/mL — ABNORMAL LOW (ref 0.30–0.70)

## 2022-02-14 LAB — TSH: TSH: 0.943 u[IU]/mL (ref 0.350–4.500)

## 2022-02-14 MED ORDER — HEPARIN BOLUS VIA INFUSION
1500.0000 [IU] | Freq: Once | INTRAVENOUS | Status: AC
  Administered 2022-02-14: 1500 [IU] via INTRAVENOUS
  Filled 2022-02-14: qty 1500

## 2022-02-14 MED ORDER — CLOPIDOGREL BISULFATE 75 MG PO TABS
75.0000 mg | ORAL_TABLET | Freq: Every day | ORAL | Status: DC
  Administered 2022-02-14 – 2022-02-15 (×2): 75 mg via ORAL
  Filled 2022-02-14 (×2): qty 1

## 2022-02-14 MED ORDER — POLYETHYLENE GLYCOL 3350 17 G PO PACK
17.0000 g | PACK | Freq: Every day | ORAL | Status: DC | PRN
  Administered 2022-02-14 – 2022-02-15 (×2): 17 g via ORAL
  Filled 2022-02-14 (×2): qty 1

## 2022-02-14 MED ORDER — HEPARIN BOLUS VIA INFUSION
750.0000 [IU] | Freq: Once | INTRAVENOUS | Status: AC
Start: 2022-02-14 — End: 2022-02-14
  Administered 2022-02-14: 750 [IU] via INTRAVENOUS
  Filled 2022-02-14: qty 750

## 2022-02-14 NOTE — Progress Notes (Addendum)
?[place] CARDIOLOGY CONSULT NOTE  ? ?    ?Patient ID: ?Alexa Pham ?MRN: [id number] ?DOB/AGE: 12/16/1929 88 y.o. ? ?Admit date: 02/13/2022 ?Referring Physician Dr. Lavonia Drafts ?Primary Physician Dr. Einar Pheasant ?Primary Cardiologist Dr. Nehemiah Massed  ?Reason for Consultation NSTEMI ? ?HPI: Alexa Pham is a 88yoF with a PMH of three-vessel CAD with PCI of prox LCx 2021, hypertension, hyperlipidemia, hypothyroidism, bronchiectasis, history of left breast cancer s/p lumpectomy 1995, left renal mass suspicious for carcinoma (stable on repeat imaging, followed by urology), who presented to [facility] 5/9  with chest pain. Cardiology is consulted for further assistance.  ? ?Interval History: ?-Continues to feel well this morning, chest pain-free ?-troponin peak so far at 2000, repeats pending ?-Echocardiogram complete performed this morning, read pending ? ?Review of systems complete and found to be negative unless listed above  ? ? ? ?Past Medical History:  ?Diagnosis Date  ? Actinic keratosis 01/28/2020  ? R thumb proximal nail fold   ? Atypical mole 05/13/2018  ? R distal lat thigh  ? Atypical mole 09/14/2014  ? left ant mid thigh  ? Atypical mole 01/19/2014  ? left paraspinal mid to upper back  ? Atypical mole 01/19/2014  ? right back mid to inf scapula  ? Atypical mole 01/19/2014  ? left posterior deltoid medial  ? Basal cell carcinoma 01/28/2020  ? left of midline mid dorsum nose  ? Basal cell carcinoma 01/06/2018  ? left preauricular   ? Basal cell carcinoma 11/22/2016  ? left upper lip  ? Basal cell carcinoma 03/17/2015  ? left lateral elbow  ? Basal cell carcinoma 07/22/2014  ? R medial inferior knee  ? Basal cell carcinoma 07/21/2020  ? Left dorsum hand. Nodular pattern. Excised 08/09/2020, margins free.  ? Basal cell carcinoma 02/23/2021  ? right dorsal hand (BCC/SCC), left dorsal hand, right chin - treated with EDC  ? Family history of breast cancer   ? History of breast cancer   ? History of pneumonia   ?  Hypertension   ? Hypothyroidism   ? Macular degeneration   ? Squamous cell carcinoma of skin 01/06/2018  ? right dorsum hand at ring finger MCP  ? Squamous cell carcinoma of skin 07/08/2017  ? left distal dorsum forearm   ?  ?Past Surgical History:  ?Procedure Laterality Date  ? ABDOMINAL HYSTERECTOMY    ? ABDOMINAL HYSTERECTOMY    ? APPENDECTOMY    ? BREAST LUMPECTOMY    ? CATARACT EXTRACTION, BILATERAL    ? CHOLECYSTECTOMY    ? CORONARY STENT INTERVENTION N/A 07/26/2020  ? Procedure: CORONARY STENT INTERVENTION;  Surgeon: Isaias Cowman, MD;  Location: [place] [facility];  Service: Cardiovascular;  Laterality: N/A;  ? LEFT HEART CATH AND CORONARY ANGIOGRAPHY Left 07/26/2020  ? Procedure: LEFT HEART CATH AND CORONARY ANGIOGRAPHY;  Surgeon: Corey Skains, MD;  Location: [place] [facility];  Service: Cardiovascular;  Laterality: Left;  ? REPLACEMENT TOTAL KNEE    ? TONSILLECTOMY AND ADENOIDECTOMY    ?  ?(Not in a hospital admission) ? ?Social History  ? ?Socioeconomic History  ? Marital status: Widowed  ?  Spouse name: Not on file  ? Number of children: Not on file  ? Years of education: Not on file  ? Highest education level: Not on file  ?Occupational History  ? Not on file  ?Tobacco Use  ? Smoking status: Former  ?  Packs/day: 1.50  ?  Years: 6.00  ?  Pack years: 9.00  ?  Types: Cigarettes  ?  Quit date: 71  ?  Years since quitting: 48.3  ? Smokeless tobacco: Never  ?Substance and Sexual Activity  ? Alcohol use: No  ?  Alcohol/week: 0.0 standard drinks  ? Drug use: No  ? Sexual activity: Not on file  ?Other Topics Concern  ? Not on file  ?Social History Narrative  ? Married   ? ?Social Determinants of Health  ? ?Financial Resource Strain: Low Risk   ? Difficulty of Paying Living Expenses: Not hard at all  ?Food Insecurity: No Food Insecurity  ? Worried About Charity fundraiser in the Last Year: Never true  ? Ran Out of Food in the Last Year: Never true  ?Transportation Needs: No Transportation  Needs  ? Lack of Transportation (Medical): No  ? Lack of Transportation (Non-Medical): No  ?Physical Activity: Not on file  ?Stress: No Stress Concern Present  ? Feeling of Stress : Not at all  ?Social Connections: Moderately Integrated  ? Frequency of Communication with Friends and Family: More than three times a week  ? Frequency of Social Gatherings with Friends and Family: More than three times a week  ? Attends Religious Services: More than 4 times per year  ? Active Member of Clubs or Organizations: Yes  ? Attends Archivist Meetings: More than 4 times per year  ? Marital Status: Widowed  ?Intimate Partner Violence: Not At Risk  ? Fear of Current or Ex-Partner: No  ? Emotionally Abused: No  ? Physically Abused: No  ? Sexually Abused: No  ?  ?Family History  ?Problem Relation Age of Onset  ? CAD Mother   ? Cancer Maternal Aunt   ?     unk type  ? Leukemia Maternal Grandmother   ? Breast cancer Half-Sister   ?     dx 1s  ?  ? ? ?PHYSICAL EXAM ?General: Pleasant conversational elderly and thin Caucasian female, well nourished, in no acute distress.  Sitting upright in hospital bed.   ?HEENT:  Normocephalic and atraumatic. Tip of nose with pea sized flesh colored ulcerated lesion ?Neck:  No JVD.  ?Lungs: Normal respiratory effort on room air.  Scattered crackles  ?heart: HRRR with frequent premature beats. Normal S1 and S2 without gallops or murmurs. Radial & DP pulses 2+ bilaterally. ?Abdomen: Non-distended appearing.  ?Msk: Normal strength and tone for age. ?Extremities: Warm and well perfused. No clubbing, cyanosis.  Trace bilateral lower extremity edema right slightly >left .  ?Neuro: Alert and oriented X 3. ?Psych:  Answers questions appropriately.  ? ?Labs: ?  ?Lab Results  ?Component Value Date  ? WBC 5.7 02/14/2022  ? HGB 12.9 02/14/2022  ? HCT 40.1 02/14/2022  ? MCV 96.2 02/14/2022  ? PLT 170 02/14/2022  ?  ?Recent Labs  ?Lab 02/14/22 ?5956  ?NA 143  ?K 3.2*  ?CL 104  ?CO2 35*  ?BUN 16   ?CREATININE 0.71  ?CALCIUM 8.4*  ?GLUCOSE 91  ? ? ?No results found for: CKTOTAL, CKMB, CKMBINDEX, TROPONINI  ?Lab Results  ?Component Value Date  ? CHOL 126 02/14/2022  ? CHOL 143 01/02/2022  ? CHOL 127 06/08/2020  ? ?Lab Results  ?Component Value Date  ? HDL 63 02/14/2022  ? HDL 65.60 01/02/2022  ? HDL 61.70 06/08/2020  ? ?Lab Results  ?Component Value Date  ? [place] 57 02/14/2022  ? [place] 64 01/02/2022  ? [place] 53 06/08/2020  ? ?Lab Results  ?Component Value Date  ? TRIG  28 02/14/2022  ? TRIG 68.0 01/02/2022  ? TRIG 60.0 06/08/2020  ? ?Lab Results  ?Component Value Date  ? CHOLHDL 2.0 02/14/2022  ? CHOLHDL 2 01/02/2022  ? CHOLHDL 2 06/08/2020  ? ?No results found for: LDLDIRECT  ?  ?Radiology: DG Chest 2 View ? ?Result Date: 02/13/2022 ?CLINICAL DATA:  Chest pain EXAM: CHEST - 2 VIEW COMPARISON:  07/11/2021 FINDINGS: Normal cardiac and mediastinal contours. Aortic atherosclerosis. Redemonstrated sequela of chronic lung disease, without acute focal pulmonary opacity. No pleural effusion or pneumothorax. Surgical clips in the left axilla. No acute osseous abnormality. IMPRESSION: No acute cardiopulmonary process. Electronically Signed   By: Merilyn Baba M.D.   On: 02/13/2022 11:07  ? ?DEXAScan ? ?Result Date: 01/15/2022 ?EXAM: DUAL X-RAY ABSORPTIOMETRY (DXA) FOR BONE MINERAL DENSITY IMPRESSION: Your patient Marine Lezotte completed a BMD test on 01/15/2022 using the [employer] (software version: 14.10) manufactured by [employer]. The following summarizes the results of our evaluation. Technologist: [facility] PATIENT BIOGRAPHICAL: Name: Cendy, Oconnor Patient ID: [id number] Birth Date: [birth year] Height: 66.0 in. Gender: Female Exam Date: 01/15/2022 Weight: 112.8 lbs. Indications: Advanced Age, Caucasian, Hypothyroid, Hysterectomy, Postmenopausal Fractures: Treatments: Calcium, Synthroid, Vitamin D DENSITOMETRY RESULTS: Site         Region     Measured Date Measured Age WHO Classification Young  Adult T-score BMD         %Change vs. Previous Significant Change (*) DualFemur Total Left 01/15/2022 90.2 Osteopenia -1.6 0.808 g/cm2 -1.6% - DualFemur Total Left 08/21/2018 86.8 Osteopenia -1.5 0.821 g/cm2 - - [place]

## 2022-02-14 NOTE — ED Notes (Signed)
Informed RN bed assigned 

## 2022-02-14 NOTE — Progress Notes (Addendum)
Per pharmacy we do not carry Vibegron 45 mg tablet. Pt made aware and states will call someone in the morning to bring the medicine. Will continue to monitor. ?

## 2022-02-14 NOTE — Consult Note (Signed)
ANTICOAGULATION CONSULT NOTE - Initial Consult ? ?Pharmacy Consult for Heparin infusion ?Indication: chest pain/ACS ? ?Allergies  ?Allergen Reactions  ? Ticagrelor Shortness Of Breath  ?  Extreme   ? ? ?Patient Measurements: ?Wt 51.2kg ?Ht 167.6cm (5'6") ?IBW  59.3 kg ?TBW 51.2kg ?Heparin Dosing Weight: 51.2 kg ? ?Vital Signs: ?Temp: 97.8 ?F (36.6 ?C) (05/10 0600) ?BP: 133/77 (05/10 1300) ?Pulse Rate: 72 (05/10 1300) ? ?Labs: ?Recent Labs  ?  02/13/22 ?1039 02/13/22 ?1341 02/13/22 ?1617 02/14/22 ?9211 02/14/22 ?9417 02/14/22 ?0814  ?HGB 15.7*  --   --   --  12.9  --   ?HCT 48.1*  --   --   --  40.1  --   ?PLT 191  --   --   --  170  --   ?APTT  --   --  30  --   --   --   ?LABPROT  --   --  13.8  --   --   --   ?INR  --   --  1.1  --   --   --   ?HEPARINUNFRC  --   --   --  0.23*  --  0.36  ?CREATININE 0.75  --   --   --  0.71  --   ?TROPONINIHS 86* 442*  --   --   --  2,071*  ? ? ?Estimated Creatinine Clearance: 37.8 mL/min (by C-G formula based on SCr of 0.71 mg/dL). ? ? ?Medical History: ?Past Medical History:  ?Diagnosis Date  ? Actinic keratosis 01/28/2020  ? R thumb proximal nail fold   ? Atypical mole 05/13/2018  ? R distal lat thigh  ? Atypical mole 09/14/2014  ? left ant mid thigh  ? Atypical mole 01/19/2014  ? left paraspinal mid to upper back  ? Atypical mole 01/19/2014  ? right back mid to inf scapula  ? Atypical mole 01/19/2014  ? left posterior deltoid medial  ? Basal cell carcinoma 01/28/2020  ? left of midline mid dorsum nose  ? Basal cell carcinoma 01/06/2018  ? left preauricular   ? Basal cell carcinoma 11/22/2016  ? left upper lip  ? Basal cell carcinoma 03/17/2015  ? left lateral elbow  ? Basal cell carcinoma 07/22/2014  ? R medial inferior knee  ? Basal cell carcinoma 07/21/2020  ? Left dorsum hand. Nodular pattern. Excised 08/09/2020, margins free.  ? Basal cell carcinoma 02/23/2021  ? right dorsal hand (BCC/SCC), left dorsal hand, right chin - treated with EDC  ? Family history of breast cancer    ? History of breast cancer   ? History of pneumonia   ? Hypertension   ? Hypothyroidism   ? Macular degeneration   ? Squamous cell carcinoma of skin 01/06/2018  ? right dorsum hand at ring finger MCP  ? Squamous cell carcinoma of skin 07/08/2017  ? left distal dorsum forearm   ? ? ?Medications:  ?(Not in a hospital admission) ? ?Assessment: ?88yo female with PMH significant for HTN, CAD, hx of breast cancer, HLD, and HTN. Patient presents to ED with chest pain. Pharmacy consulted to manage heparin gtt for NSTEMI.  ? ?CBC and BMP within normal limits. CrCl 37.8 ml/min. Serial troponin 86>>442.  Baseline INR and aPTT ordered prior to initiating heparin.  ? ?No AC noted prior to admission.  ? ?Goal of Therapy:  ?Heparin level 0.3-0.7 units/ml ?Monitor platelets by anticoagulation protocol: Yes ?  ?Plan:  ?5/10:  HL @ 4081 =  0.36, therapeutic x1 ?Continue heparin drip rate at 700 units/hr. ?Will check confirmatory HL in 8 hrs. ?CBC daily  ? ?Suzette Flagler A Leshae Mcclay ?02/14/2022 1:37 PM ? ? ? ?

## 2022-02-14 NOTE — Progress Notes (Signed)
?PROGRESS NOTE ? ? ? Alexa Pham  ONG:[id number] DOB: [birth year] DOA: 02/13/2022 ?PCP: Einar Pheasant, MD  ? ?Brief Narrative:  ?88 year old with history of CAD status post PCI HTN, hypothyroidism, HLD, osteoarthritis admitted for chest pain.  Patient also had elevated troponin with normal EKG.  Diagnosed with NSTEMI, cardiology team consulted, echocardiogram and heparin drip ordered. ? ? ?Assessment & Plan: ? Principal Problem: ?  NSTEMI (non-ST elevated myocardial infarction) ([place]) ?Active Problems: ?  Hypertension ?  Hypercholesterolemia ?  CAD (coronary artery disease) ?  ? ?NSTEMI ?History of CAD status post PCI ?-Troponin elevated [phone], no acute EKG changes.  Currently on heparin drip ?- On aspirin and statin ?- Echocardiogram-EF 60%, no wall motion abnormality ?-Cardiology team following ?-We will defer the need for LHC to cardiology team ? ?Hypokalemia ?- As needed repletion ? ?Essential hypertension ?-Norvasc 10 mg daily, Avapro, metoprolol ? ?Hyperlipidemia ?-Lipitor 40 mg daily ? ?Hypothyroidism ?-Synthroid.  TSH normal ? ? ? ? ?DVT prophylaxis: Heparin drip ?Code Status: Full Code  ?Family Communication:  Called Son. No answer. ? ?Status is: Inpatient ?Remains inpatient appropriate because: Maintain hosp stay for Hep drip and further cardiac work up.  ? ? ?Subjective: ?Chest pain free during my visit.  ? ? ? ?Examination: ? ?General exam: Appears calm and comfortable  ?Respiratory system: Clear to auscultation. Respiratory effort normal. ?Cardiovascular system: S1 & S2 heard, RRR. No JVD, murmurs, rubs, gallops or clicks. No pedal edema. ?Gastrointestinal system: Abdomen is nondistended, soft and nontender. No organomegaly or masses felt. Normal bowel sounds heard. ?Central nervous system: Alert and oriented. No focal neurological deficits. ?Extremities: Symmetric 5 x 5 power. ?Skin: No rashes, lesions or ulcers ?Psychiatry: Judgement and insight appear normal. Mood & affect appropriate.   ? ? ? ?Objective: ?Vitals:  ? 02/14/22 0414 02/14/22 0500 02/14/22 0600 02/14/22 0700  ?BP: 136/72 (!) 158/63 129/66 (!) 146/71  ?Pulse: 64 63 60 (!) 58  ?Resp: '19 15 16 19  '$ ?Temp: 98 ?F (36.7 ?C)  97.8 ?F (36.6 ?C)   ?TempSrc:      ?SpO2: 92% 92% 93% 95%  ? ? ?Intake/Output Summary (Last 24 hours) at 02/14/2022 0920 ?Last data filed at 02/14/2022 0400 ?Gross per 24 hour  ?Intake 175.9 ml  ?Output --  ?Net 175.9 ml  ? ?There were no vitals filed for this visit. ? ? ?Data Reviewed:  ? ?CBC: ?Recent Labs  ?Lab 02/13/22 ?1039 02/14/22 ?3664  ?WBC 5.8 5.7  ?HGB 15.7* 12.9  ?HCT 48.1* 40.1  ?MCV 98.6 96.2  ?PLT 191 170  ? ?Basic Metabolic Panel: ?Recent Labs  ?Lab 02/13/22 ?1039 02/14/22 ?4034  ?NA 140 143  ?K 3.6 3.2*  ?CL 100 104  ?CO2 35* 35*  ?GLUCOSE 120* 91  ?BUN 17 16  ?CREATININE 0.75 0.71  ?CALCIUM 9.4 8.4*  ? ?GFR: ?Estimated Creatinine Clearance: 37.8 mL/min (by C-G formula based on SCr of 0.71 mg/dL). ?Liver Function Tests: ?No results for input(s): AST, ALT, ALKPHOS, BILITOT, PROT, ALBUMIN in the last 168 hours. ?No results for input(s): LIPASE, AMYLASE in the last 168 hours. ?No results for input(s): AMMONIA in the last 168 hours. ?Coagulation Profile: ?Recent Labs  ?Lab 02/13/22 ?1617  ?INR 1.1  ? ?Cardiac Enzymes: ?No results for input(s): CKTOTAL, CKMB, CKMBINDEX, TROPONINI in the last 168 hours. ?BNP (last 3 results) ?No results for input(s): PROBNP in the last 8760 hours. ?HbA1C: ?Recent Labs  ?  02/13/22 ?1039  ?HGBA1C 5.6  ? ?CBG: ?No results for input(s): GLUCAP  in the last 168 hours. ?Lipid Profile: ?Recent Labs  ?  02/14/22 ?3419  ?CHOL 126  ?HDL 63  ?[place] 57  ?TRIG 28  ?CHOLHDL 2.0  ? ?Thyroid Function Tests: ?Recent Labs  ?  02/14/22 ?3790  ?TSH 0.943  ? ?Anemia Panel: ?No results for input(s): VITAMINB12, FOLATE, FERRITIN, TIBC, IRON, RETICCTPCT in the last 72 hours. ?Sepsis Labs: ?No results for input(s): PROCALCITON, LATICACIDVEN in the last 168 hours. ? ?No results found for this or any  previous visit (from the past 240 hour(s)).  ? ? ? ? ? ?Radiology Studies: ?DG Chest 2 View ? ?Result Date: 02/13/2022 ?CLINICAL DATA:  Chest pain EXAM: CHEST - 2 VIEW COMPARISON:  07/11/2021 FINDINGS: Normal cardiac and mediastinal contours. Aortic atherosclerosis. Redemonstrated sequela of chronic lung disease, without acute focal pulmonary opacity. No pleural effusion or pneumothorax. Surgical clips in the left axilla. No acute osseous abnormality. IMPRESSION: No acute cardiopulmonary process. Electronically Signed   By: Merilyn Baba M.D.   On: 02/13/2022 11:07  ? ?ECHOCARDIOGRAM COMPLETE ? ?Result Date: 02/14/2022 ?   ECHOCARDIOGRAM REPORT   Patient Name:   Alexa Pham Date of Exam: 02/14/2022 Medical Rec #:  [id number]    Height:       66.0 in Accession #:    [id number]   Weight:       112.8 lb Date of Birth:  01/19/1930    BSA:          1.568 m? Patient Age:    13 years     BP:           146/71 mmHg Patient Gender: F            HR:           58 bpm. Exam Location:  [facility] Procedure: 2D Echo, Cardiac Doppler and Color Doppler Indications:     NSTEMI I21.4  History:         Patient has no prior history of Echocardiogram examinations.                  Risk Factors:Hypertension.  Sonographer:     Sherrie Sport Referring Phys:  [id number] [place] TANG Diagnosing Phys: Serafina Royals MD IMPRESSIONS  1. Left ventricular ejection fraction, by estimation, is 55 to 60%. The left ventricle has normal function. The left ventricle has no regional wall motion abnormalities. There is mild left ventricular hypertrophy. Left ventricular diastolic parameters were normal.  2. Right ventricular systolic function is normal. The right ventricular size is normal.  3. The mitral valve is normal in structure. Mild mitral valve regurgitation.  4. The aortic valve is normal in structure. Aortic valve regurgitation is mild. FINDINGS  Left Ventricle: Left ventricular ejection fraction, by estimation, is 55 to 60%. The left ventricle has normal  function. The left ventricle has no regional wall motion abnormalities. The left ventricular internal cavity size was normal in size. There is  mild left ventricular hypertrophy. Left ventricular diastolic parameters were normal. Right Ventricle: The right ventricular size is normal. No increase in right ventricular wall thickness. Right ventricular systolic function is normal. Left Atrium: Left atrial size was normal in size. Right Atrium: Right atrial size was normal in size. Pericardium: There is no evidence of pericardial effusion. Mitral Valve: The mitral valve is normal in structure. Mild mitral valve regurgitation. MV peak gradient, 3.7 mmHg. The mean mitral valve gradient is 1.0 mmHg. Tricuspid Valve: The tricuspid valve is normal in structure. Tricuspid valve regurgitation is  mild. Aortic Valve: The aortic valve is normal in structure. Aortic valve regurgitation is mild. Aortic regurgitation PHT measures 547 msec. Aortic valve mean gradient measures 4.5 mmHg. Aortic valve peak gradient measures 8.4 mmHg. Aortic valve area, by VTI measures 2.13 cm?. Pulmonic Valve: The pulmonic valve was normal in structure. Pulmonic valve regurgitation is trivial. Aorta: The aortic root and ascending aorta are structurally normal, with no evidence of dilitation. IAS/Shunts: No atrial level shunt detected by color flow Doppler.  LEFT VENTRICLE PLAX 2D LVIDd:         4.60 cm   Diastology LVIDs:         2.90 cm   LV e' medial:    4.57 cm/s LV PW:         1.20 cm   LV E/e' medial:  16.4 LV IVS:        0.90 cm   LV e' lateral:   5.98 cm/s LVOT diam:     2.00 cm   LV E/e' lateral: 12.5 LV SV:         64 LV SV Index:   41 LVOT Area:     3.14 cm?  RIGHT VENTRICLE RV Basal diam:  3.20 cm RV S prime:     11.00 cm/s TAPSE (M-mode): 2.6 cm LEFT ATRIUM             Index        RIGHT ATRIUM           Index LA diam:        2.60 cm 1.66 cm/m?   RA Area:     13.60 cm? LA Vol (A2C):   23.8 ml 15.18 ml/m?  RA Volume:   40.20 ml  25.64 ml/m?  LA Vol (A4C):   33.4 ml 21.30 ml/m? LA Biplane Vol: 30.7 ml 19.58 ml/m?  AORTIC VALVE                    PULMONIC VALVE AV Area (Vmax):    1.84 cm?     PV Vmax:        0.77 m/s AV Area (Vmean):   1.77 cm?     PV

## 2022-02-14 NOTE — ED Notes (Signed)
RN to bedside to introduce self to pt. Pt resting.  ?

## 2022-02-14 NOTE — Consult Note (Signed)
ANTICOAGULATION CONSULT NOTE - Initial Consult ? ?Pharmacy Consult for Heparin infusion ?Indication: chest pain/ACS ? ?Allergies  ?Allergen Reactions  ? Ticagrelor Shortness Of Breath  ?  Extreme   ? ? ?Patient Measurements: ?Wt 51.2kg ?Ht 167.6cm (5'6") ?IBW  59.3 kg ?TBW 51.2kg ?Heparin Dosing Weight: 51.2 kg ? ?Vital Signs: ?Temp: 97.7 ?F (36.5 ?C) (05/10 0112) ?Temp Source: Oral (05/09 1856) ?BP: 155/65 (05/10 0112) ?Pulse Rate: 73 (05/10 0112) ? ?Labs: ?Recent Labs  ?  02/13/22 ?1039 02/13/22 ?1341 02/13/22 ?1617 02/14/22 ?6295  ?HGB 15.7*  --   --   --   ?HCT 48.1*  --   --   --   ?PLT 191  --   --   --   ?APTT  --   --  30  --   ?LABPROT  --   --  13.8  --   ?INR  --   --  1.1  --   ?HEPARINUNFRC  --   --   --  0.23*  ?CREATININE 0.75  --   --   --   ?TROPONINIHS 86* 442*  --   --   ? ? ?Estimated Creatinine Clearance: 37.8 mL/min (by C-G formula based on SCr of 0.75 mg/dL). ? ? ?Medical History: ?Past Medical History:  ?Diagnosis Date  ? Actinic keratosis 01/28/2020  ? R thumb proximal nail fold   ? Atypical mole 05/13/2018  ? R distal lat thigh  ? Atypical mole 09/14/2014  ? left ant mid thigh  ? Atypical mole 01/19/2014  ? left paraspinal mid to upper back  ? Atypical mole 01/19/2014  ? right back mid to inf scapula  ? Atypical mole 01/19/2014  ? left posterior deltoid medial  ? Basal cell carcinoma 01/28/2020  ? left of midline mid dorsum nose  ? Basal cell carcinoma 01/06/2018  ? left preauricular   ? Basal cell carcinoma 11/22/2016  ? left upper lip  ? Basal cell carcinoma 03/17/2015  ? left lateral elbow  ? Basal cell carcinoma 07/22/2014  ? R medial inferior knee  ? Basal cell carcinoma 07/21/2020  ? Left dorsum hand. Nodular pattern. Excised 08/09/2020, margins free.  ? Basal cell carcinoma 02/23/2021  ? right dorsal hand (BCC/SCC), left dorsal hand, right chin - treated with EDC  ? Family history of breast cancer   ? History of breast cancer   ? History of pneumonia   ? Hypertension   ?  Hypothyroidism   ? Macular degeneration   ? Squamous cell carcinoma of skin 01/06/2018  ? right dorsum hand at ring finger MCP  ? Squamous cell carcinoma of skin 07/08/2017  ? left distal dorsum forearm   ? ? ?Medications:  ?(Not in a hospital admission) ? ?Assessment: ?88yo female with PMH significant for HTN, CAD, hx of breast cancer, HLD, and HTN. Patient presents to ED with chest pain. Pharmacy consulted to manage heparin gtt for NSTEMI.  ? ?CBC and BMP within normal limits. CrCl 37.8 ml/min. Serial troponin 86>>442.  Baseline INR and aPTT ordered prior to initiating heparin.  ? ?No AC noted prior to admission.  ? ?Goal of Therapy:  ?Heparin level 0.3-0.7 units/ml ?Monitor platelets by anticoagulation protocol: Yes ?  ?Plan:  ?5/10:  HL @ 0051 = 0.23, subtherapeutic  ?Will order heparin 750 units IV X 1 bolus and increase drip rate to 700 units/hr. ?Will recheck HL 8 hrs after rate change.  ? ?Alexa Pham D ?02/14/2022 1:28 AM ? ? ? ?

## 2022-02-14 NOTE — Consult Note (Signed)
ANTICOAGULATION CONSULT NOTE ? ?Pharmacy Consult for Heparin infusion ?Indication: chest pain/ACS ? ?Allergies  ?Allergen Reactions  ? Ticagrelor Shortness Of Breath  ?  Extreme   ? ? ?Patient Measurements: ?Wt 51.2kg ?Ht 167.6cm (5'6") ?IBW  59.3 kg ?TBW 51.2kg ?Heparin Dosing Weight: 51.2 kg ? ?Vital Signs: ?Temp: 98.5 ?F (36.9 ?C) (05/10 1555) ?Temp Source: Oral (05/10 1555) ?BP: 153/76 (05/10 1555) ?Pulse Rate: 75 (05/10 1555) ? ?Labs: ?Recent Labs  ?  02/13/22 ?1039 02/13/22 ?1341 02/13/22 ?1617 02/14/22 ?1610 02/14/22 ?9604 02/14/22 ?5409 02/14/22 ?1328 02/14/22 ?1600  ?HGB 15.7*  --   --   --  12.9  --   --   --   ?HCT 48.1*  --   --   --  40.1  --   --   --   ?PLT 191  --   --   --  170  --   --   --   ?APTT  --   --  30  --   --   --   --   --   ?LABPROT  --   --  13.8  --   --   --   --   --   ?INR  --   --  1.1  --   --   --   --   --   ?HEPARINUNFRC  --   --   --  0.23*  --  0.36  --  0.12*  ?CREATININE 0.75  --   --   --  0.71  --   --   --   ?TROPONINIHS 86* 442*  --   --   --  2,071* 1,836*  --   ? ? ?Estimated Creatinine Clearance: 37.8 mL/min (by C-G formula based on SCr of 0.71 mg/dL). ? ? ?Medical History: ?Past Medical History:  ?Diagnosis Date  ? Actinic keratosis 01/28/2020  ? R thumb proximal nail fold   ? Atypical mole 05/13/2018  ? R distal lat thigh  ? Atypical mole 09/14/2014  ? left ant mid thigh  ? Atypical mole 01/19/2014  ? left paraspinal mid to upper back  ? Atypical mole 01/19/2014  ? right back mid to inf scapula  ? Atypical mole 01/19/2014  ? left posterior deltoid medial  ? Basal cell carcinoma 01/28/2020  ? left of midline mid dorsum nose  ? Basal cell carcinoma 01/06/2018  ? left preauricular   ? Basal cell carcinoma 11/22/2016  ? left upper lip  ? Basal cell carcinoma 03/17/2015  ? left lateral elbow  ? Basal cell carcinoma 07/22/2014  ? R medial inferior knee  ? Basal cell carcinoma 07/21/2020  ? Left dorsum hand. Nodular pattern. Excised 08/09/2020, margins free.  ? Basal  cell carcinoma 02/23/2021  ? right dorsal hand (BCC/SCC), left dorsal hand, right chin - treated with EDC  ? Family history of breast cancer   ? History of breast cancer   ? History of pneumonia   ? Hypertension   ? Hypothyroidism   ? Macular degeneration   ? Squamous cell carcinoma of skin 01/06/2018  ? right dorsum hand at ring finger MCP  ? Squamous cell carcinoma of skin 07/08/2017  ? left distal dorsum forearm   ? ? ?Assessment: ?88yo female with PMH significant for HTN, CAD, hx of breast cancer, HLD, and HTN. Patient presents to ED with chest pain. Pharmacy consulted to manage heparin gtt for NSTEMI. Cardiology plan is to continue for 48 hours ? ?  No AC noted prior to admission.  ? ?Goal of Therapy:  ?Heparin level 0.3-0.7 units/ml ?Monitor platelets by anticoagulation protocol: Yes ?  ?Plan:  ?Heparin level SUBtherapeutic ?Bolus 1500 units IV heparin x 1 then increase infusion rate to 900 units/hr ?recheck heparin level 8 hours after rate change ?CBC daily while on IV heparin  ? ?Dallie Piles ?02/14/2022 4:38 PM ? ? ? ?

## 2022-02-14 NOTE — Plan of Care (Signed)
  Problem: Health Behavior/Discharge Planning: Goal: Ability to manage health-related needs will improve Outcome: Progressing   Problem: Clinical Measurements: Goal: Respiratory complications will improve Outcome: Progressing   Problem: Clinical Measurements: Goal: Cardiovascular complication will be avoided Outcome: Progressing   Problem: Activity: Goal: Risk for activity intolerance will decrease Outcome: Progressing   Problem: Pain Managment: Goal: General experience of comfort will improve Outcome: Progressing   Problem: Safety: Goal: Ability to remain free from injury will improve Outcome: Progressing   

## 2022-02-14 NOTE — Progress Notes (Signed)
*  PRELIMINARY RESULTS* ?Echocardiogram ?2D Echocardiogram has been performed. ? ?Alexa Pham, Sonia Side ?02/14/2022, 7:54 AM ?

## 2022-02-15 DIAGNOSIS — R079 Chest pain, unspecified: Secondary | ICD-10-CM

## 2022-02-15 DIAGNOSIS — I214 Non-ST elevation (NSTEMI) myocardial infarction: Secondary | ICD-10-CM | POA: Diagnosis not present

## 2022-02-15 LAB — HEPARIN LEVEL (UNFRACTIONATED)
Heparin Unfractionated: 0.56 IU/mL (ref 0.30–0.70)
Heparin Unfractionated: 0.49 IU/mL (ref 0.30–0.70)

## 2022-02-15 LAB — BASIC METABOLIC PANEL
Anion gap: 4 — ABNORMAL LOW (ref 5–15)
BUN: 18 mg/dL (ref 8–23)
CO2: 33 mmol/L — ABNORMAL HIGH (ref 22–32)
Calcium: 8.5 mg/dL — ABNORMAL LOW (ref 8.9–10.3)
Chloride: 103 mmol/L (ref 98–111)
Creatinine, Ser: 0.79 mg/dL (ref 0.44–1.00)
GFR, Estimated: >60 mL/min (ref >60)
Glucose, Bld: 88 mg/dL (ref 70–99)
Potassium: 3.6 mmol/L (ref 3.5–5.1)
Sodium: 140 mmol/L (ref 135–145)

## 2022-02-15 LAB — LIPOPROTEIN A (LPA): Lipoprotein (a): 101.3 nmol/L — ABNORMAL HIGH (ref <75.0)

## 2022-02-15 LAB — MAGNESIUM: Magnesium: 1.9 mg/dL (ref 1.7–2.4)

## 2022-02-15 LAB — CBC
HCT: 41.9 % (ref 36.0–46.0)
Hemoglobin: 13.7 g/dL (ref 12.0–15.0)
MCH: 31.8 pg (ref 26.0–34.0)
MCHC: 32.7 g/dL (ref 30.0–36.0)
MCV: 97.2 fL (ref 80.0–100.0)
Platelets: 163 10*3/uL (ref 150–400)
RBC: 4.31 MIL/uL (ref 3.87–5.11)
RDW: 14.2 % (ref 11.5–15.5)
WBC: 5.8 10*3/uL (ref 4.0–10.5)
nRBC: 0 % (ref 0.0–0.2)

## 2022-02-15 MED ORDER — IRBESARTAN 75 MG PO TABS: 225.0000 mg | ORAL_TABLET | Freq: Every day | ORAL | 0 refills | Status: DC

## 2022-02-15 MED ORDER — METOPROLOL SUCCINATE ER 50 MG PO TB24
50.0000 mg | ORAL_TABLET | Freq: Every day | ORAL | Status: DC
Start: 2022-02-15 — End: 2022-02-15
  Administered 2022-02-15: 50 mg via ORAL
  Filled 2022-02-15: qty 1

## 2022-02-15 MED ORDER — LACTULOSE 10 GM/15ML PO SOLN
30.0000 g | ORAL | Status: DC
  Administered 2022-02-15: 30 g via ORAL
  Filled 2022-02-15: qty 60

## 2022-02-15 MED ORDER — CLOPIDOGREL BISULFATE 75 MG PO TABS
75.0000 mg | ORAL_TABLET | Freq: Every day | ORAL | 0 refills | Status: DC
Start: 2022-02-16 — End: 2022-04-19

## 2022-02-15 MED ORDER — IRBESARTAN 150 MG PO TABS: 225.0000 mg | ORAL_TABLET | Freq: Every day | ORAL | Status: DC

## 2022-02-15 MED ORDER — ASPIRIN 81 MG PO TBEC: 81.0000 mg | DELAYED_RELEASE_TABLET | Freq: Every day | ORAL | 0 refills | Status: DC

## 2022-02-15 NOTE — Progress Notes (Addendum)
?[place] CARDIOLOGY CONSULT NOTE  ? ?    ?Patient ID: ?Alexa Pham ?MRN: [id number] ?DOB/AGE: January 24, 1930 88 y.o. ? ?Admit date: 02/13/2022 ?Referring Physician Dr. Lavonia Drafts ?Primary Physician Dr. Einar Pheasant ?Primary Cardiologist Dr. Nehemiah Massed  ?Reason for Consultation NSTEMI ? ?HPI: Alexa Pham is a 45yoF with a PMH of three-vessel CAD with PCI of prox LCx 2021, hypertension, hyperlipidemia, hypothyroidism, bronchiectasis, history of left breast cancer s/p lumpectomy 1995, left renal mass suspicious for carcinoma (stable on repeat imaging, followed by urology), who presented to [facility] 5/9  with chest pain. Cardiology is consulted for further assistance.  ? ?Interval History: ?-Seen and examined in PCU, continues to feel well without chest pain or other complaints other than wishing to go home. ?-troponin peaked at 2071 yesterday ?-Echocardiogram complete revealed preserved LVEF of 55-60% without WMA's , mild MR ? ?Review of systems complete and found to be negative unless listed above  ? ? ? ?Past Medical History:  ?Diagnosis Date  ? Actinic keratosis 01/28/2020  ? R thumb proximal nail fold   ? Atypical mole 05/13/2018  ? R distal lat thigh  ? Atypical mole 09/14/2014  ? left ant mid thigh  ? Atypical mole 01/19/2014  ? left paraspinal mid to upper back  ? Atypical mole 01/19/2014  ? right back mid to inf scapula  ? Atypical mole 01/19/2014  ? left posterior deltoid medial  ? Basal cell carcinoma 01/28/2020  ? left of midline mid dorsum nose  ? Basal cell carcinoma 01/06/2018  ? left preauricular   ? Basal cell carcinoma 11/22/2016  ? left upper lip  ? Basal cell carcinoma 03/17/2015  ? left lateral elbow  ? Basal cell carcinoma 07/22/2014  ? R medial inferior knee  ? Basal cell carcinoma 07/21/2020  ? Left dorsum hand. Nodular pattern. Excised 08/09/2020, margins free.  ? Basal cell carcinoma 02/23/2021  ? right dorsal hand (BCC/SCC), left dorsal hand, right chin - treated with EDC  ? Family history of  breast cancer   ? History of breast cancer   ? History of pneumonia   ? Hypertension   ? Hypothyroidism   ? Macular degeneration   ? Squamous cell carcinoma of skin 01/06/2018  ? right dorsum hand at ring finger MCP  ? Squamous cell carcinoma of skin 07/08/2017  ? left distal dorsum forearm   ?  ?Past Surgical History:  ?Procedure Laterality Date  ? ABDOMINAL HYSTERECTOMY    ? ABDOMINAL HYSTERECTOMY    ? APPENDECTOMY    ? BREAST LUMPECTOMY    ? CATARACT EXTRACTION, BILATERAL    ? CHOLECYSTECTOMY    ? CORONARY STENT INTERVENTION N/A 07/26/2020  ? Procedure: CORONARY STENT INTERVENTION;  Surgeon: Isaias Cowman, MD;  Location: [facility];  Service: Cardiovascular;  Laterality: N/A;  ? LEFT HEART CATH AND CORONARY ANGIOGRAPHY Left 07/26/2020  ? Procedure: LEFT HEART CATH AND CORONARY ANGIOGRAPHY;  Surgeon: Corey Skains, MD;  Location: [facility];  Service: Cardiovascular;  Laterality: Left;  ? REPLACEMENT TOTAL KNEE    ? TONSILLECTOMY AND ADENOIDECTOMY    ?  ?Medications Prior to Admission  ?Medication Sig Dispense Refill Last Dose  ? amLODipine (NORVASC) 10 MG tablet TAKE 1 TABLET(10 MG) BY MOUTH DAILY 90 tablet 1 02/13/2022 at 0730  ? atorvastatin (LIPITOR) 40 MG tablet TAKE 1 TABLET(40 MG) BY MOUTH DAILY 90 tablet 1 02/12/2022 at 2000  ? calcium citrate-vitamin D (CITRACAL+D) 315-200 MG-UNIT tablet Take 1 tablet by mouth 2 (two)  times daily.   02/13/2022 at 0730  ? irbesartan (AVAPRO) 150 MG tablet Take 150 mg by mouth daily.   02/13/2022 at 0730  ? levothyroxine (SYNTHROID) 137 MCG tablet Take 1 tablet (137 mcg total) by mouth daily before breakfast. 90 tablet 1 02/13/2022 at 0730  ? metoprolol succinate (TOPROL-XL) 50 MG 24 hr tablet TAKE 1 TABLET(50 MG) BY MOUTH EVERY DAY WITH OR IMMEDIATELY FOLLOWING A MEAL 90 tablet 0 02/12/2022 at 2000  ? Multiple Vitamin (MULTIVITAMIN) capsule Take 1 capsule by mouth daily.   02/12/2022 at 2000  ? Multiple Vitamins-Minerals (PRESERVISION AREDS 2 PO) Take 1  tablet by mouth 2 (two) times daily.    02/13/2022 at 0730  ? Polyethyl Glycol-Propyl Glycol (SYSTANE) 0.4-0.3 % SOLN Place 1 drop into both eyes in the morning, at noon, and at bedtime.   02/13/2022 at 0730  ? Vibegron (GEMTESA) 75 MG TABS Take 75 mg by mouth daily. 30 tablet 11 02/12/2022 at 2000  ? ibandronate (BONIVA) 150 MG tablet Take 1 tablet (150 mg total) by mouth every 30 (thirty) days. Take in the morning with a full glass of water, on an empty stomach, and do not take anything else by mouth or lie down for the next 30 min. 12 tablet 1   ? ? ?Social History  ? ?Socioeconomic History  ? Marital status: Widowed  ?  Spouse name: Not on file  ? Number of children: Not on file  ? Years of education: Not on file  ? Highest education level: Not on file  ?Occupational History  ? Not on file  ?Tobacco Use  ? Smoking status: Former  ?  Packs/day: 1.50  ?  Years: 6.00  ?  Pack years: 9.00  ?  Types: Cigarettes  ?  Quit date: 72  ?  Years since quitting: 48.3  ? Smokeless tobacco: Never  ?Substance and Sexual Activity  ? Alcohol use: No  ?  Alcohol/week: 0.0 standard drinks  ? Drug use: No  ? Sexual activity: Not on file  ?Other Topics Concern  ? Not on file  ?Social History Narrative  ? Married   ? ?Social Determinants of Health  ? ?Financial Resource Strain: Low Risk   ? Difficulty of Paying Living Expenses: Not hard at all  ?Food Insecurity: No Food Insecurity  ? Worried About Charity fundraiser in the Last Year: Never true  ? Ran Out of Food in the Last Year: Never true  ?Transportation Needs: No Transportation Needs  ? Lack of Transportation (Medical): No  ? Lack of Transportation (Non-Medical): No  ?Physical Activity: Not on file  ?Stress: No Stress Concern Present  ? Feeling of Stress : Not at all  ?Social Connections: Moderately Integrated  ? Frequency of Communication with Friends and Family: More than three times a week  ? Frequency of Social Gatherings with Friends and Family: More than three times a week  ?  Attends Religious Services: More than 4 times per year  ? Active Member of Clubs or Organizations: Yes  ? Attends Archivist Meetings: More than 4 times per year  ? Marital Status: Widowed  ?Intimate Partner Violence: Not At Risk  ? Fear of Current or Ex-Partner: No  ? Emotionally Abused: No  ? Physically Abused: No  ? Sexually Abused: No  ?  ?Family History  ?Problem Relation Age of Onset  ? CAD Mother   ? Cancer Maternal Aunt   ?     unk type  ?  Leukemia Maternal Grandmother   ? Breast cancer Half-Sister   ?     dx 25s  ?  ? ? ?PHYSICAL EXAM ?General: Pleasant conversational elderly and thin Caucasian female, well nourished, in no acute distress.  Sitting upright in hospital bed in PCU.   ?HEENT:  Normocephalic and atraumatic. Tip of nose with pea sized flesh colored ulcerated lesion ?Neck:  No JVD.  ?Lungs: Normal respiratory effort on room air.  Scattered crackles  ?heart: HRRR with frequent premature beats. Normal S1 and S2 without gallops or murmurs. Radial & DP pulses 2+ bilaterally. ?Abdomen: Non-distended appearing.  ?Msk: Normal strength and tone for age. ?Extremities: Warm and well perfused. No clubbing, cyanosis.  Trace bilateral lower extremity edema right slightly >left .  ?Neuro: Alert and oriented X 3. ?Psych:  Answers questions appropriately.  ? ?Labs: ?  ?Lab Results  ?Component Value Date  ? WBC 5.8 02/15/2022  ? HGB 13.7 02/15/2022  ? HCT 41.9 02/15/2022  ? MCV 97.2 02/15/2022  ? PLT 163 02/15/2022  ?  ?Recent Labs  ?Lab 02/15/22 ?7903  ?NA 140  ?K 3.6  ?CL 103  ?CO2 33*  ?BUN 18  ?CREATININE 0.79  ?CALCIUM 8.5*  ?GLUCOSE 88  ? ? ?No results found for: CKTOTAL, CKMB, CKMBINDEX, TROPONINI  ?Lab Results  ?Component Value Date  ? CHOL 126 02/14/2022  ? CHOL 143 01/02/2022  ? CHOL 127 06/08/2020  ? ?Lab Results  ?Component Value Date  ? HDL 63 02/14/2022  ? HDL 65.60 01/02/2022  ? HDL 61.70 06/08/2020  ? ?Lab Results  ?Component Value Date  ? [place] 57 02/14/2022  ? [place] 64 01/02/2022   ? [place] 53 06/08/2020  ? ?Lab Results  ?Component Value Date  ? TRIG 28 02/14/2022  ? TRIG 68.0 01/02/2022  ? TRIG 60.0 06/08/2020  ? ?Lab Results  ?Component Value Date  ? CHOLHDL 2.0 02/14/2022  ?

## 2022-02-15 NOTE — TOC Initial Note (Signed)
Transition of Care (TOC) - Initial/Assessment Note  ? ? ?Patient Details  ?Name: Alexa Pham ?MRN: [id number] ?Date of Birth: June 13, 1930 ? ?Transition of Care (TOC) CM/SW Contact:    ?Laurena Slimmer, RN ?Phone Number: ?02/15/2022, 1:41 PM ? ?Clinical Narrative:                 ? ? ?Transition of Care (TOC) Screening Note ? ? ?Patient Details  ?Name: Alexa Pham ?Date of Birth: 05-Sep-1930 ? ? ?Transition of Care (TOC) CM/SW Contact:    ?Laurena Slimmer, RN ?Phone Number: ?02/15/2022, 1:41 PM ? ? ? ?Transition of Care Department [facility]) has reviewed patient and no TOC needs have been identified at this time. We will continue to monitor patient advancement through interdisciplinary progression rounds. If new patient transition needs arise, please place a TOC consult. ? ? ?  ?  ? ? ?Patient Goals and CMS Choice ?  ?  ?  ? ?Expected Discharge Plan and Services ?  ?  ?  ?  ?  ?Expected Discharge Date: 02/15/22               ?  ?  ?  ?  ?  ?  ?  ?  ?  ?  ? ?Prior Living Arrangements/Services ?  ?  ?  ?       ?  ?  ?  ?  ? ?Activities of Daily Living ?Home Assistive Devices/Equipment: Eyeglasses, Hearing aid ?ADL Screening (condition at time of admission) ?Patient's cognitive ability adequate to safely complete daily activities?: Yes ?Is the patient deaf or have difficulty hearing?: No ?Does the patient have difficulty seeing, even when wearing glasses/contacts?: No ?Does the patient have difficulty concentrating, remembering, or making decisions?: No ?Patient able to express need for assistance with ADLs?: Yes ?Does the patient have difficulty dressing or bathing?: No ?Independently performs ADLs?: Yes (appropriate for developmental age) ?Does the patient have difficulty walking or climbing stairs?: No ?Weakness of Legs: None ?Weakness of Arms/Hands: None ? ?Permission Sought/Granted ?  ?  ?   ?   ?   ?   ? ?Emotional Assessment ?  ?  ?  ?  ?  ?  ? ?Admission diagnosis:  NSTEMI (non-ST elevated myocardial infarction) ([place])  [I21.4] ?Patient Active Problem List  ? Diagnosis Date Noted  ? NSTEMI (non-ST elevated myocardial infarction) ([place]) 02/13/2022  ? Basal cell carcinoma 02/23/2021  ? Genetic testing 08/31/2020  ? Family history of breast cancer   ? Kidney mass 08/08/2020  ? CAD (coronary artery disease) 07/26/2020  ? Angina pectoris ([place]) 07/04/2020  ? Abnormal liver function test 06/15/2020  ? Dizziness 05/04/2020  ? Elevated troponin 05/04/2020  ? Hypomagnesemia 05/04/2020  ? Hypotension 05/04/2020  ? Shingles 01/10/2020  ? Weight loss 05/10/2019  ? Stress 04/19/2019  ? Aortic atherosclerosis ([place]) 11/20/2018  ? Abnormal CT lung screening 11/07/2018  ? Bronchiectasis without complication ([place]) 19/37/9024  ? Abnormal chest CT 10/19/2018  ? Health care maintenance 07/06/2018  ? History of breast cancer 12/01/2017  ? Macular degeneration 12/01/2017  ? Hypertension 11/28/2017  ? Hypothyroidism 11/28/2017  ? Hypercholesterolemia 11/28/2017  ? ?PCP:  Einar Pheasant, MD ?Pharmacy:   ?[facility], [place] ?[place] ?[place] [address] ?Phone: [phone] Fax: [phone] ? ? ? ? ?Social Determinants of Health (SDOH) Interventions ?  ? ?Readmission Risk Interventions ?   ?  View : No data to display.  ?  ?  ?  ? ? ? ?

## 2022-02-15 NOTE — Discharge Summary (Signed)
Physician Discharge Summary  ?Alexa Pham YQM:[id number] DOB: [birth year] DOA: 02/13/2022 ? ?PCP: Einar Pheasant, MD ? ?Admit date: 02/13/2022 ?Discharge date: 02/15/2022 ? ?Admitted From: Home ?Disposition:  Home ? ?Recommendations for Outpatient Follow-up:  ?Follow up with PCP in 1-2 weeks ?Please obtain BMP/CBC in one week your next doctors visit.  ?Patient will be on aspirin and Plavix daily per cardiology team ? ? ?Discharge Condition: Stable ?CODE STATUS: Full code ?Diet recommendation: Heart healthy ? ?Brief/Interim Summary: ?88 year old with history of CAD status post PCI HTN, hypothyroidism, HLD, osteoarthritis admitted for chest pain.  Patient also had elevated troponin with normal EKG.  Diagnosed with NSTEMI, cardiology team consulted, echocardiogram and heparin drip ordered.  Echocardiogram showed EF of 60% with no wall motion abnormality.  Patient is now on aspirin, Plavix and statin.  Cardiology recommended no further intervention besides dual antiplatelet therapy and statin.  Their service to arrange for outpatient follow-up. ?  ?  ?Assessment & Plan: ? Principal Problem: ?  NSTEMI (non-ST elevated myocardial infarction) ([place]) ?Active Problems: ?  Hypertension ?  Hypercholesterolemia ?  CAD (coronary artery disease) ?  ?  ?NSTEMI ?History of CAD status post PCI ?- Troponins peaked at 2K, no acute EKG changes.  We will stop the heparin drip ?- On aspirin, Plavix and statin ?- On Lopressor 25 mg twice daily, plans to transition to XL upon discharge ?- Echocardiogram-EF 60%, no wall motion abnormality ?- Deferred need for LHC to cardiology team ?  ?Hypokalemia ?- As needed repletion ?  ?Essential hypertension ?- Continue Norvasc, Toprol-XL, Avapro increased to 225 mg orally daily ?  ?Hyperlipidemia ?-Lipitor 40 mg daily ?  ?Hypothyroidism ?-Synthroid.  TSH normal ?  ?  ?  ? ? ? ? ? ?Discharge Diagnoses:  ?Principal Problem: ?  NSTEMI (non-ST elevated myocardial infarction) ([place]) ?Active Problems: ?   Hypertension ?  Hypercholesterolemia ?  CAD (coronary artery disease) ? ? ? ? ? ?Consultations: ?Cardiology ? ?Subjective: ?Feels great no complaints ? ?Discharge Exam: ?Vitals:  ? 02/15/22 0752 02/15/22 1117  ?BP: (!) 142/67 121/70  ?Pulse: 67 78  ?Resp: 16 16  ?Temp: 98 ?F (36.7 ?C) 97.9 ?F (36.6 ?C)  ?SpO2: 96% 97%  ? ?Vitals:  ? 02/15/22 0401 02/15/22 0735 02/15/22 0752 02/15/22 1117  ?BP: 135/77 (!) 150/56 (!) 142/67 121/70  ?Pulse: 70 79 67 78  ?Resp: '20 16 16 16  '$ ?Temp: 98.1 ?F (36.7 ?C) 97.9 ?F (36.6 ?C) 98 ?F (36.7 ?C) 97.9 ?F (36.6 ?C)  ?TempSrc: Oral Oral  Oral  ?SpO2: 96% 98% 96% 97%  ?Weight:      ?Height:      ? ? ?General: Pt is alert, awake, not in acute distress ?Cardiovascular: RRR, S1/S2 +, no rubs, no gallops ?Respiratory: CTA bilaterally, no wheezing, no rhonchi ?Abdominal: Soft, NT, ND, bowel sounds + ?Extremities: no edema, no cyanosis ? ?Discharge Instructions ? ? ?Allergies as of 02/15/2022   ? ?   Reactions  ? Ticagrelor Shortness Of Breath  ? Extreme   ? ?  ? ?  ?Medication List  ?  ? ?TAKE these medications   ? ?amLODipine 10 MG tablet ?Commonly known as: NORVASC ?TAKE 1 TABLET(10 MG) BY MOUTH DAILY ?  ?aspirin 81 MG EC tablet ?Take 1 tablet (81 mg total) by mouth daily. Swallow whole. ?Start taking on: Feb 16, 2022 ?  ?atorvastatin 40 MG tablet ?Commonly known as: LIPITOR ?TAKE 1 TABLET(40 MG) BY MOUTH DAILY ?  ?calcium citrate-vitamin D 315-200 MG-UNIT tablet ?  Commonly known as: CITRACAL+D ?Take 1 tablet by mouth 2 (two) times daily. ?  ?clopidogrel 75 MG tablet ?Commonly known as: PLAVIX ?Take 1 tablet (75 mg total) by mouth daily. ?Start taking on: Feb 16, 2022 ?  ?Gemtesa 75 MG Tabs ?Generic drug: Vibegron ?Take 75 mg by mouth daily. ?  ?ibandronate 150 MG tablet ?Commonly known as: Boniva ?Take 1 tablet (150 mg total) by mouth every 30 (thirty) days. Take in the morning with a full glass of water, on an empty stomach, and do not take anything else by mouth or lie down for the next 30  min. ?  ?irbesartan 75 MG tablet ?Commonly known as: AVAPRO ?Take 3 tablets (225 mg total) by mouth daily. ?What changed:  ?medication strength ?how much to take ?  ?levothyroxine 137 MCG tablet ?Commonly known as: SYNTHROID ?Take 1 tablet (137 mcg total) by mouth daily before breakfast. ?  ?metoprolol succinate 50 MG 24 hr tablet ?Commonly known as: TOPROL-XL ?TAKE 1 TABLET(50 MG) BY MOUTH EVERY DAY WITH OR IMMEDIATELY FOLLOWING A MEAL ?  ?multivitamin capsule ?Take 1 capsule by mouth daily. ?  ?PRESERVISION AREDS 2 PO ?Take 1 tablet by mouth 2 (two) times daily. ?  ?Systane 0.4-0.3 % Soln ?Generic drug: Polyethyl Glycol-Propyl Glycol ?Place 1 drop into both eyes in the morning, at noon, and at bedtime. ?  ? ?  ? ? Follow-up Information   ? ? Einar Pheasant, MD Follow up in 1 week(s).   ?Specialty: Internal Medicine ?Contact information: ?[address] ?[address] ?[place] [address] ?[phone] ? ? ?  ?  ? ?  ?  ? ?  ? ?Allergies  ?Allergen Reactions  ? Ticagrelor Shortness Of Breath  ?  Extreme   ? ? ?You were cared for by a hospitalist during your hospital stay. If you have any questions about your discharge medications or the care you received while you were in the hospital after you are discharged, you can call the unit and asked to speak with the hospitalist on call if the hospitalist that took care of you is not available. Once you are discharged, your primary care physician will handle any further medical issues. Please note that no refills for any discharge medications will be authorized once you are discharged, as it is imperative that you return to your primary care physician (or establish a relationship with a primary care physician if you do not have one) for your aftercare needs so that they can reassess your need for medications and monitor your lab values. ? ? ?Procedures/Studies: ?DG Chest 2 View ? ?Result Date: 02/13/2022 ?CLINICAL DATA:  Chest pain EXAM: CHEST - 2 VIEW COMPARISON:   07/11/2021 FINDINGS: Normal cardiac and mediastinal contours. Aortic atherosclerosis. Redemonstrated sequela of chronic lung disease, without acute focal pulmonary opacity. No pleural effusion or pneumothorax. Surgical clips in the left axilla. No acute osseous abnormality. IMPRESSION: No acute cardiopulmonary process. Electronically Signed   By: Merilyn Baba M.D.   On: 02/13/2022 11:07  ? ?ECHOCARDIOGRAM COMPLETE ? ?Result Date: 02/14/2022 ?   ECHOCARDIOGRAM REPORT   Patient Name:   Alexa Pham Date of Exam: 02/14/2022 Medical Rec #:  [id number]    Height:       66.0 in Accession #:    [id number]   Weight:       112.8 lb Date of Birth:  Mar 04, 1930    BSA:          1.568 m? Patient Age:    88  years     BP:           146/71 mmHg Patient Gender: F            HR:           58 bpm. Exam Location:  [facility] Procedure: 2D Echo, Cardiac Doppler and Color Doppler Indications:     NSTEMI I21.4  History:         Patient has no prior history of Echocardiogram examinations.                  Risk Factors:Hypertension.  Sonographer:     Sherrie Sport Referring Phys:  [id number] Healy TANG Diagnosing Phys: Serafina Royals MD IMPRESSIONS  1. Left ventricular ejection fraction, by estimation, is 55 to 60%. The left ventricle has normal function. The left ventricle has no regional wall motion abnormalities. There is mild left ventricular hypertrophy. Left ventricular diastolic parameters were normal.  2. Right ventricular systolic function is normal. The right ventricular size is normal.  3. The mitral valve is normal in structure. Mild mitral valve regurgitation.  4. The aortic valve is normal in structure. Aortic valve regurgitation is mild. FINDINGS  Left Ventricle: Left ventricular ejection fraction, by estimation, is 55 to 60%. The left ventricle has normal function. The left ventricle has no regional wall motion abnormalities. The left ventricular internal cavity size was normal in size. There is  mild left ventricular hypertrophy.  Left ventricular diastolic parameters were normal. Right Ventricle: The right ventricular size is normal. No increase in right ventricular wall thickness. Right ventricular systolic function is normal. Left At

## 2022-02-15 NOTE — Consult Note (Signed)
ANTICOAGULATION CONSULT NOTE ? ?Pharmacy Consult for Heparin infusion ?Indication: chest pain/ACS ? ?Allergies  ?Allergen Reactions  ? Ticagrelor Shortness Of Breath  ?  Extreme   ? ? ?Patient Measurements: ?Wt 51.2kg ?Ht 167.6cm (5'6") ?IBW Height: '5\' 6"'$  (167.6 cm) ?Weight: 51.2 kg (112 lb 12.8 oz) ?IBW/kg (Calculated) : 59.359.3 kg ?TBW 51.2kg ?Heparin Dosing Weight: 51.2 kg ? ?Vital Signs: ?Temp: 97.7 ?F (36.5 ?C) (05/11 0025) ?Temp Source: Oral (05/11 0025) ?BP: 164/74 (05/11 0025) ?Pulse Rate: 102 (05/10 1952) ? ?Labs: ?Recent Labs  ?  02/13/22 ?1039 02/13/22 ?1341 02/13/22 ?1617 02/14/22 ?9242 02/14/22 ?6834 02/14/22 ?1962 02/14/22 ?1328 02/14/22 ?1600 02/15/22 ?2297  ?HGB 15.7*  --   --   --  12.9  --   --   --   --   ?HCT 48.1*  --   --   --  40.1  --   --   --   --   ?PLT 191  --   --   --  170  --   --   --   --   ?APTT  --   --  30  --   --   --   --   --   --   ?LABPROT  --   --  13.8  --   --   --   --   --   --   ?INR  --   --  1.1  --   --   --   --   --   --   ?HEPARINUNFRC  --   --   --    < >  --  0.36  --  0.12* 0.56  ?CREATININE 0.75  --   --   --  0.71  --   --   --   --   ?TROPONINIHS 86* 442*  --   --   --  2,071* 1,836*  --   --   ? < > = values in this interval not displayed.  ? ? ?Estimated Creatinine Clearance: 37.8 mL/min (by C-G formula based on SCr of 0.71 mg/dL). ? ? ?Medical History: ?Past Medical History:  ?Diagnosis Date  ? Actinic keratosis 01/28/2020  ? R thumb proximal nail fold   ? Atypical mole 05/13/2018  ? R distal lat thigh  ? Atypical mole 09/14/2014  ? left ant mid thigh  ? Atypical mole 01/19/2014  ? left paraspinal mid to upper back  ? Atypical mole 01/19/2014  ? right back mid to inf scapula  ? Atypical mole 01/19/2014  ? left posterior deltoid medial  ? Basal cell carcinoma 01/28/2020  ? left of midline mid dorsum nose  ? Basal cell carcinoma 01/06/2018  ? left preauricular   ? Basal cell carcinoma 11/22/2016  ? left upper lip  ? Basal cell carcinoma 03/17/2015  ? left  lateral elbow  ? Basal cell carcinoma 07/22/2014  ? R medial inferior knee  ? Basal cell carcinoma 07/21/2020  ? Left dorsum hand. Nodular pattern. Excised 08/09/2020, margins free.  ? Basal cell carcinoma 02/23/2021  ? right dorsal hand (BCC/SCC), left dorsal hand, right chin - treated with EDC  ? Family history of breast cancer   ? History of breast cancer   ? History of pneumonia   ? Hypertension   ? Hypothyroidism   ? Macular degeneration   ? Squamous cell carcinoma of skin 01/06/2018  ? right dorsum hand at ring finger MCP  ? Squamous  cell carcinoma of skin 07/08/2017  ? left distal dorsum forearm   ? ? ?Assessment: ?88yo female with PMH significant for HTN, CAD, hx of breast cancer, HLD, and HTN. Patient presents to ED with chest pain. Pharmacy consulted to manage heparin gtt for NSTEMI. Cardiology plan is to continue for 48 hours ? ?No AC noted prior to admission.  ? ?Goal of Therapy:  ?Heparin level 0.3-0.7 units/ml ?Monitor platelets by anticoagulation protocol: Yes ?  ?Plan:  ?5/11:  HL @ 0047 = 0.56, therapeutic X 1  ?Will continue pt on current rate and recheck HL in 8 hrs @ 0900.  ? ?Jream Broyles D ?02/15/2022 2:10 AM ? ? ? ?

## 2022-02-15 NOTE — Progress Notes (Signed)
OT Cancellation Note ? ?Patient Details ?Name: Alexa Pham ?MRN: [id number] ?DOB: [birth year] ? ? ?Cancelled Treatment:    Reason Eval/Treat Not Completed: OT screened, no needs identified, will sign off. Per RN, pt independent. Per MD ok to sign off.  ? ?Ardeth Perfect., MPH, MS, OTR/L ?ascom [phone] ?02/15/22, 1:07 PM ? ?

## 2022-02-18 ENCOUNTER — Encounter: Payer: Self-pay | Admitting: Internal Medicine

## 2022-02-18 DIAGNOSIS — M8580 Other specified disorders of bone density and structure, unspecified site: Secondary | ICD-10-CM | POA: Insufficient documentation

## 2022-02-18 NOTE — Assessment & Plan Note (Signed)
Continues avapro, amlodipine and metoprolol.  Follow pressures. Follow metabolic panel.  ?

## 2022-02-18 NOTE — Assessment & Plan Note (Signed)
No increased sob.  Breathing stable.  

## 2022-02-18 NOTE — Assessment & Plan Note (Signed)
Discussed bone density results - osteopenia hip and wrist.  Given wrist -2.4 - discussed various treatment options.  Discussed calcium, vitamin  D and weight bearing exercise.  Discussed prescription medication - including oral bisphosphonates, reclast and prolia.  She is interested in oral bisphosphonates.  Start boniva.  Discussed possible side effects of medication.  Discussed proper way to take medication.  Follow.  ?

## 2022-02-19 ENCOUNTER — Telehealth: Payer: Self-pay

## 2022-02-19 NOTE — Telephone Encounter (Signed)
Transition Care Management Unsuccessful Follow-up Telephone Call ? ?Date of discharge and from where:  02/15/22 [facility] ? ?Attempts:  1st Attempt ? ?Reason for unsuccessful TCM follow-up call:  Unable to reach patient. Will follow. ? ?  ?

## 2022-02-20 NOTE — Telephone Encounter (Signed)
Transition Care Management Unsuccessful Follow-up Telephone Call ? ?Date of discharge and from where:  02/15/22 [facility] ? ?Attempts:  2nd Attempt ? ?Reason for unsuccessful TCM follow-up call:  No answer/busy. No voicemail. Patient is scheduled to follow up with PCP 02/23/22 @ 11:00. Keep all scheduled appointments.  ? ?  ?

## 2022-02-21 NOTE — Telephone Encounter (Signed)
Transition Care Management Unsuccessful Follow-up Telephone Call ? ?Date of discharge and from where:  02/15/22 [facility] ? ?Attempts:  3rd Attempt ? ?Reason for unsuccessful TCM follow-up call:  No answer/busy. No voicemail. Patient is scheduled to follow up with PCP 02/23/22 @ 11:00. Keep all scheduled appointments. This closes TCM follow up. Okay to schedule and code.  ? ?  ?

## 2022-02-23 ENCOUNTER — Encounter: Payer: Self-pay | Admitting: Internal Medicine

## 2022-02-23 ENCOUNTER — Ambulatory Visit (INDEPENDENT_AMBULATORY_CARE_PROVIDER_SITE_OTHER): Payer: Medicare Other | Admitting: Internal Medicine

## 2022-02-23 DIAGNOSIS — I2510 Atherosclerotic heart disease of native coronary artery without angina pectoris: Secondary | ICD-10-CM

## 2022-02-23 DIAGNOSIS — I10 Essential (primary) hypertension: Secondary | ICD-10-CM

## 2022-02-23 DIAGNOSIS — J479 Bronchiectasis, uncomplicated: Secondary | ICD-10-CM | POA: Diagnosis not present

## 2022-02-23 DIAGNOSIS — E039 Hypothyroidism, unspecified: Secondary | ICD-10-CM

## 2022-02-23 DIAGNOSIS — I700 Atherosclerosis of aorta: Secondary | ICD-10-CM | POA: Diagnosis not present

## 2022-02-23 DIAGNOSIS — E7800 Pure hypercholesterolemia, unspecified: Secondary | ICD-10-CM

## 2022-02-23 NOTE — Progress Notes (Unsigned)
[name], female   DOB: April 15, 1930, 88 y.o.   MRN: [id number]   Subjective:    [name], female    DOB: July 31, 1930, 88 y.o.   MRN: [id number]  This visit occurred during the SARS-CoV-2 public health emergency.  Safety protocols were in place, including screening questions prior to the visit, additional usage of staff PPE, and extensive cleaning of exam room while observing appropriate contact time as indicated for disinfecting solutions.   Patient here for hospital follow up.   Chief Complaint  Patient presents with   [facility] f/u - NSTEMI   .   HPI Admitted 02/13/22 - 02/15/22 - with chest pain - diagnosed with NSTEMI.  Cardiology consulted.  Started on heparin.  ECHO with no wall motion abnormality.  EF 60%.  Cardiology recommended no further intervention besides dual antiplatelet therapy and statin.  Discharged on toprol XL, aspirin, plavix and statin.  Avapro was increased to '225mg'$  q day.  Since her discharge, she has done well.  No further chest pressure.  Tries to stay active.  No sob.  Eating.  No nausea or vomiting.  No abdominal pain.  Bowels moving.     Past Medical History:  Diagnosis Date   Actinic keratosis 01/28/2020   R thumb proximal nail fold    Atypical mole 05/13/2018   R distal lat thigh   Atypical mole 09/14/2014   left ant mid thigh   Atypical mole 01/19/2014   left paraspinal mid to upper back   Atypical mole 01/19/2014   right back mid to inf scapula   Atypical mole 01/19/2014   left posterior deltoid medial   Basal cell carcinoma 01/28/2020   left of midline mid dorsum nose   Basal cell carcinoma 01/06/2018   left preauricular    Basal cell carcinoma 11/22/2016   left upper lip   Basal cell carcinoma 03/17/2015   left lateral elbow   Basal cell carcinoma 07/22/2014   R medial inferior knee   Basal cell carcinoma 07/21/2020   Left dorsum hand. Nodular pattern. Excised 08/09/2020, margins free.   Basal cell  carcinoma 02/23/2021   right dorsal hand (BCC/SCC), left dorsal hand, right chin - treated with EDC   Family history of breast cancer    History of breast cancer    History of pneumonia    Hypertension    Hypothyroidism    Macular degeneration    Squamous cell carcinoma of skin 01/06/2018   right dorsum hand at ring finger MCP   Squamous cell carcinoma of skin 07/08/2017   left distal dorsum forearm    Past Surgical History:  Procedure Laterality Date   ABDOMINAL HYSTERECTOMY     ABDOMINAL HYSTERECTOMY     APPENDECTOMY     BREAST LUMPECTOMY     CATARACT EXTRACTION, BILATERAL     CHOLECYSTECTOMY     CORONARY STENT INTERVENTION N/A 07/26/2020   Procedure: CORONARY STENT INTERVENTION;  Surgeon: Isaias Cowman, MD;  Location: [place] [facility];  Service: Cardiovascular;  Laterality: N/A;   LEFT HEART CATH AND CORONARY ANGIOGRAPHY Left 07/26/2020   Procedure: LEFT HEART CATH AND CORONARY ANGIOGRAPHY;  Surgeon: Corey Skains, MD;  Location: [place] [facility];  Service: Cardiovascular;  Laterality: Left;   REPLACEMENT TOTAL KNEE     TONSILLECTOMY AND ADENOIDECTOMY     Family History  Problem Relation Age of Onset   CAD Mother    Cancer Maternal Aunt  unk type   Leukemia Maternal Grandmother    Breast cancer Half-Sister        dx 54s   Social History   Socioeconomic History   Marital status: Widowed    Spouse name: Not on file   Number of children: Not on file   Years of education: Not on file   Highest education level: Not on file  Occupational History   Not on file  Tobacco Use   Smoking status: Former    Packs/day: 1.50    Years: 6.00    Pack years: 9.00    Types: Cigarettes    Quit date: 46    Years since quitting: 48.4   Smokeless tobacco: Never  Substance and Sexual Activity   Alcohol use: No    Alcohol/week: 0.0 standard drinks   Drug use: No   Sexual activity: Not on file  Other Topics Concern   Not on file  Social History  Narrative   Married    Social Determinants of Health   Financial Resource Strain: Low Risk    Difficulty of Paying Living Expenses: Not hard at all  Food Insecurity: No Food Insecurity   Worried About Charity fundraiser in the Last Year: Never true   Arboriculturist in the Last Year: Never true  Transportation Needs: No Transportation Needs   Lack of Transportation (Medical): No   Lack of Transportation (Non-Medical): No  Physical Activity: Not on file  Stress: No Stress Concern Present   Feeling of Stress : Not at all  Social Connections: Moderately Integrated   Frequency of Communication with Friends and Family: More than three times a week   Frequency of Social Gatherings with Friends and Family: More than three times a week   Attends Religious Services: More than 4 times per year   Active Member of [employer] or Organizations: Yes   Attends Archivist Meetings: More than 4 times per year   Marital Status: Widowed     Review of Systems  Constitutional:  Negative for appetite change and unexpected weight change.  HENT:  Negative for congestion and sinus pressure.   Respiratory:  Negative for cough, chest tightness and shortness of breath.   Cardiovascular:  Negative for chest pain, palpitations and leg swelling.  Gastrointestinal:  Negative for abdominal pain, diarrhea, nausea and vomiting.  Genitourinary:  Negative for difficulty urinating and dysuria.  Musculoskeletal:  Negative for joint swelling and myalgias.  Skin:  Negative for color change and rash.  Neurological:  Negative for dizziness, light-headedness and headaches.  Psychiatric/Behavioral:  Negative for agitation and dysphoric mood.       Objective:     BP 122/68 (BP Location: Left Arm, Patient Position: Sitting, Cuff Size: Small)   Pulse 61   Temp 98.3 F (36.8 C) (Temporal)   Resp 12   Ht '5\' 6"'$  (1.676 m)   Wt 108 lb 9.6 oz (49.3 kg)   SpO2 98%   BMI 17.53 kg/m  Wt Readings from Last 3  Encounters:  02/23/22 108 lb 9.6 oz (49.3 kg)  02/14/22 112 lb 12.8 oz (51.2 kg)  02/12/22 112 lb 12.8 oz (51.2 kg)    Physical Exam Vitals reviewed.  Constitutional:      General: She is not in acute distress.    Appearance: Normal appearance.  HENT:     Head: Normocephalic and atraumatic.     Right Ear: External ear normal.     Left Ear: External ear normal.  Eyes:     General: No scleral icterus.       Right eye: No discharge.        Left eye: No discharge.     Conjunctiva/sclera: Conjunctivae normal.  Neck:     Thyroid: No thyromegaly.  Cardiovascular:     Rate and Rhythm: Normal rate and regular rhythm.  Pulmonary:     Effort: No respiratory distress.     Breath sounds: Normal breath sounds. No wheezing.  Abdominal:     General: Bowel sounds are normal.     Palpations: Abdomen is soft.     Tenderness: There is no abdominal tenderness.  Musculoskeletal:        General: No swelling or tenderness.     Cervical back: Neck supple. No tenderness.  Lymphadenopathy:     Cervical: No cervical adenopathy.  Skin:    Findings: No erythema or rash.  Neurological:     Mental Status: She is alert.  Psychiatric:        Mood and Affect: Mood normal.        Behavior: Behavior normal.     Outpatient Encounter Medications as of 02/23/2022  Medication Sig   amLODipine (NORVASC) 10 MG tablet TAKE 1 TABLET(10 MG) BY MOUTH DAILY   aspirin EC 81 MG EC tablet Take 1 tablet (81 mg total) by mouth daily. Swallow whole.   atorvastatin (LIPITOR) 40 MG tablet TAKE 1 TABLET(40 MG) BY MOUTH DAILY   calcium citrate-vitamin D (CITRACAL+D) 315-200 MG-UNIT tablet Take 1 tablet by mouth 2 (two) times daily.   clopidogrel (PLAVIX) 75 MG tablet Take 1 tablet (75 mg total) by mouth daily.   ibandronate (BONIVA) 150 MG tablet Take 1 tablet (150 mg total) by mouth every 30 (thirty) days. Take in the morning with a full glass of water, on an empty stomach, and do not take anything else by mouth or lie  down for the next 30 min.   irbesartan (AVAPRO) 75 MG tablet Take 3 tablets (225 mg total) by mouth daily.   levothyroxine (SYNTHROID) 137 MCG tablet Take 1 tablet (137 mcg total) by mouth daily before breakfast.   metoprolol succinate (TOPROL-XL) 50 MG 24 hr tablet TAKE 1 TABLET(50 MG) BY MOUTH EVERY DAY WITH OR IMMEDIATELY FOLLOWING A MEAL   Multiple Vitamin (MULTIVITAMIN) capsule Take 1 capsule by mouth daily.   Multiple Vitamins-Minerals (PRESERVISION AREDS 2 PO) Take 1 tablet by mouth 2 (two) times daily.    Polyethyl Glycol-Propyl Glycol (SYSTANE) 0.4-0.3 % SOLN Place 1 drop into both eyes in the morning, at noon, and at bedtime.   Vibegron (GEMTESA) 75 MG TABS Take 75 mg by mouth daily.   No facility-administered encounter medications on file as of 02/23/2022.     Lab Results  Component Value Date   WBC 5.8 02/15/2022   HGB 13.7 02/15/2022   HCT 41.9 02/15/2022   PLT 163 02/15/2022   GLUCOSE 88 02/15/2022   CHOL 126 02/14/2022   TRIG 28 02/14/2022   HDL 63 02/14/2022   LDLCALC 57 02/14/2022   ALT 18 01/02/2022   AST 33 01/02/2022   NA 140 02/15/2022   K 3.6 02/15/2022   CL 103 02/15/2022   CREATININE 0.79 02/15/2022   BUN 18 02/15/2022   CO2 33 (H) 02/15/2022   TSH 0.943 02/14/2022   INR 1.1 02/13/2022   HGBA1C 5.6 02/13/2022    ECHOCARDIOGRAM COMPLETE  Result Date: 02/14/2022    ECHOCARDIOGRAM REPORT   Patient Name:   NABEEHA  [place] Date of Exam: 02/14/2022 Medical Rec #:  [id number]    Height:       66.0 in Accession #:    [id number]   Weight:       112.8 lb Date of Birth:  11/24/1929    BSA:          1.568 m Patient Age:    79 years     BP:           146/71 mmHg Patient Gender: F            HR:           58 bpm. Exam Location:  [facility] Procedure: 2D Echo, Cardiac Doppler and Color Doppler Indications:     NSTEMI I21.4  History:         Patient has no prior history of Echocardiogram examinations.                  Risk Factors:Hypertension.  Sonographer:     Sherrie Sport Referring  Phys:  [id number] [place] [facility] Diagnosing Phys: Serafina Royals MD IMPRESSIONS  1. Left ventricular ejection fraction, by estimation, is 55 to 60%. The left ventricle has normal function. The left ventricle has no regional wall motion abnormalities. There is mild left ventricular hypertrophy. Left ventricular diastolic parameters were normal.  2. Right ventricular systolic function is normal. The right ventricular size is normal.  3. The mitral valve is normal in structure. Mild mitral valve regurgitation.  4. The aortic valve is normal in structure. Aortic valve regurgitation is mild. FINDINGS  Left Ventricle: Left ventricular ejection fraction, by estimation, is 55 to 60%. The left ventricle has normal function. The left ventricle has no regional wall motion abnormalities. The left ventricular internal cavity size was normal in size. There is  mild left ventricular hypertrophy. Left ventricular diastolic parameters were normal. Right Ventricle: The right ventricular size is normal. No increase in right ventricular wall thickness. Right ventricular systolic function is normal. Left Atrium: Left atrial size was normal in size. Right Atrium: Right atrial size was normal in size. Pericardium: There is no evidence of pericardial effusion. Mitral Valve: The mitral valve is normal in structure. Mild mitral valve regurgitation. MV peak gradient, 3.7 mmHg. The mean mitral valve gradient is 1.0 mmHg. Tricuspid Valve: The tricuspid valve is normal in structure. Tricuspid valve regurgitation is mild. Aortic Valve: The aortic valve is normal in structure. Aortic valve regurgitation is mild. Aortic regurgitation PHT measures 547 msec. Aortic valve mean gradient measures 4.5 mmHg. Aortic valve peak gradient measures 8.4 mmHg. Aortic valve area, by VTI measures 2.13 cm. Pulmonic Valve: The pulmonic valve was normal in structure. Pulmonic valve regurgitation is trivial. Aorta: The aortic root and ascending aorta are  structurally normal, with no evidence of dilitation. IAS/Shunts: No atrial level shunt detected by color flow Doppler.  LEFT VENTRICLE PLAX 2D LVIDd:         4.60 cm   Diastology LVIDs:         2.90 cm   LV e' medial:    4.57 cm/s LV PW:         1.20 cm   LV E/e' medial:  16.4 LV IVS:        0.90 cm   LV e' lateral:   5.98 cm/s LVOT diam:     2.00 cm   LV E/e' lateral: 12.5 LV SV:         64 LV SV Index:   41 LVOT  Area:     3.14 cm  RIGHT VENTRICLE RV Basal diam:  3.20 cm RV S prime:     11.00 cm/s TAPSE (M-mode): 2.6 cm LEFT ATRIUM             Index        RIGHT ATRIUM           Index LA diam:        2.60 cm 1.66 cm/m   RA Area:     13.60 cm LA Vol (A2C):   23.8 ml 15.18 ml/m  RA Volume:   40.20 ml  25.64 ml/m LA Vol (A4C):   33.4 ml 21.30 ml/m LA Biplane Vol: 30.7 ml 19.58 ml/m  AORTIC VALVE                    PULMONIC VALVE AV Area (Vmax):    1.84 cm     PV Vmax:        0.77 m/s AV Area (Vmean):   1.77 cm     PV Vmean:       57.900 cm/s AV Area (VTI):     2.13 cm     PV VTI:         0.163 m AV Vmax:           145.00 cm/s  PV Peak grad:   2.4 mmHg AV Vmean:          97.150 cm/s  PV Mean grad:   2.0 mmHg AV VTI:            0.300 m      RVOT Peak grad: 4 mmHg AV Peak Grad:      8.4 mmHg AV Mean Grad:      4.5 mmHg LVOT Vmax:         84.90 cm/s LVOT Vmean:        54.600 cm/s LVOT VTI:          0.204 m LVOT/AV VTI ratio: 0.68 AI PHT:            547 msec  AORTA Ao Root diam: 2.90 cm MITRAL VALVE               TRICUSPID VALVE MV Area (PHT): 3.68 cm    TR Peak grad:   22.3 mmHg MV Area VTI:   2.34 cm    TR Vmax:        236.00 cm/s MV Peak grad:  3.7 mmHg MV Mean grad:  1.0 mmHg    SHUNTS MV Vmax:       0.96 m/s    Systemic VTI:  0.20 m MV Vmean:      54.3 cm/s   Systemic Diam: 2.00 cm MV Decel Time: 206 msec    Pulmonic VTI:  0.149 m MV E velocity: 75.00 cm/s MV A velocity: 87.40 cm/s MV E/A ratio:  0.86 Serafina Royals MD Electronically signed by Serafina Royals MD Signature Date/Time: 02/14/2022/9:12:34 AM     Final        Assessment & Plan:   Problem List Items Addressed This Visit     Aortic atherosclerosis ([place])    On lipitor now.         Bronchiectasis without complication (HCC)    No increased sob.  Breathing stable.        CAD (coronary artery disease)    Admitted with chest pain - diagnosed with NSTEMI.  Cardiology consulted.  Started on heparin.  ECHO with no wall  motion abnormality.  EF 60%.  Cardiology recommended no further intervention besides dual antiplatelet therapy and statin. No further chest pressure.  Per discharge summary, cardiology f/u recommended.  Discussed.  Request to f/u with Dr Nehemiah Massed.  Will arrange appt.       Relevant Orders   Ambulatory referral to Cardiology   Hypercholesterolemia    On lipitor now.  Follow lipid panel and liver function tests.  Since just started, will need liver panel checked in 6 weeks.        Hypertension    Continues avapro, amlodipine and metoprolol.  Follow pressures. Follow metabolic panel.        Hypothyroidism    On thyroid replacement.  Follow tsh.          Einar Pheasant, MD

## 2022-02-24 ENCOUNTER — Encounter: Payer: Self-pay | Admitting: Internal Medicine

## 2022-02-24 NOTE — Assessment & Plan Note (Signed)
No increased sob.  Breathing stable.  

## 2022-02-24 NOTE — Assessment & Plan Note (Signed)
On lipitor now.

## 2022-02-24 NOTE — Assessment & Plan Note (Addendum)
On lipitor now.  Follow lipid panel and liver function tests.  Since just started, will need liver panel checked in 6 weeks.

## 2022-02-24 NOTE — Assessment & Plan Note (Signed)
On thyroid replacement.  Follow tsh.  

## 2022-02-24 NOTE — Assessment & Plan Note (Addendum)
Admitted with chest pain - diagnosed with NSTEMI.  Cardiology consulted.  Started on heparin.  ECHO with no wall motion abnormality.  EF 60%.  Cardiology recommended no further intervention besides dual antiplatelet therapy and statin. No further chest pressure.  Per discharge summary, cardiology f/u recommended.  Discussed.  Request to f/u with Dr Nehemiah Massed.  Will arrange appt.

## 2022-02-24 NOTE — Assessment & Plan Note (Signed)
Continues avapro, amlodipine and metoprolol.  Follow pressures. Follow metabolic panel.

## 2022-02-26 ENCOUNTER — Encounter: Payer: Self-pay | Admitting: Dermatology

## 2022-03-08 ENCOUNTER — Telehealth: Payer: Self-pay

## 2022-03-08 ENCOUNTER — Other Ambulatory Visit: Payer: Self-pay

## 2022-03-08 MED ORDER — IRBESARTAN 75 MG PO TABS: 225.0000 mg | ORAL_TABLET | Freq: Every day | ORAL | 3 refills | Status: DC

## 2022-03-08 NOTE — Telephone Encounter (Signed)
Patient states she would like to request a refill of the irbesartan (AVAPRO) 75 MG tablet.  Patient states she would like to start taking this two times per day, so she needs more of the 75 MG tablets.  *Patient states her preferred pharmacy is [facility] on [address] near [place].

## 2022-03-08 NOTE — Telephone Encounter (Signed)
sent 

## 2022-04-02 ENCOUNTER — Other Ambulatory Visit: Payer: Self-pay | Admitting: Family

## 2022-04-13 ENCOUNTER — Ambulatory Visit (INDEPENDENT_AMBULATORY_CARE_PROVIDER_SITE_OTHER): Payer: Medicare Other | Admitting: Internal Medicine

## 2022-04-13 ENCOUNTER — Telehealth: Payer: Self-pay | Admitting: Internal Medicine

## 2022-04-13 ENCOUNTER — Encounter: Payer: Self-pay | Admitting: Internal Medicine

## 2022-04-13 VITALS — BP 134/80 | HR 75 | Temp 97.9°F | Resp 15 | Ht 66.0 in | Wt 113.0 lb

## 2022-04-13 DIAGNOSIS — Z853 Personal history of malignant neoplasm of breast: Secondary | ICD-10-CM

## 2022-04-13 DIAGNOSIS — I10 Essential (primary) hypertension: Secondary | ICD-10-CM

## 2022-04-13 DIAGNOSIS — E7800 Pure hypercholesterolemia, unspecified: Secondary | ICD-10-CM | POA: Diagnosis not present

## 2022-04-13 DIAGNOSIS — I2510 Atherosclerotic heart disease of native coronary artery without angina pectoris: Secondary | ICD-10-CM

## 2022-04-13 DIAGNOSIS — I700 Atherosclerosis of aorta: Secondary | ICD-10-CM | POA: Diagnosis not present

## 2022-04-13 DIAGNOSIS — J479 Bronchiectasis, uncomplicated: Secondary | ICD-10-CM

## 2022-04-13 DIAGNOSIS — N2889 Other specified disorders of kidney and ureter: Secondary | ICD-10-CM

## 2022-04-13 DIAGNOSIS — E039 Hypothyroidism, unspecified: Secondary | ICD-10-CM

## 2022-04-13 NOTE — Telephone Encounter (Signed)
Pt called in stating that Dr. Nicki Reaper requested her to callback with a list of her medication that she is taking... Pt stated that she is taking medication (amLODipine (NORVASC) 10 MG tablet), (aspirin EC 81 MG EC tablet), (atorvastatin (LIPITOR) 40 MG tablet), (calcium citrate-vitamin D (CITRACAL+D) 315-200 MG-UNIT tablet), (ibandronate (BONIVA) 150 MG tablet), (irbesartan (AVAPRO) 75 MG tablet), (levothyroxine (SYNTHROID) 137 MCG tablet), (Multiple Vitamin (MULTIVITAMIN) capsule), (clopidogrel (PLAVIX) 75 MG tablet), (Multiple Vitamins-Minerals (PRESERVISION AREDS 2 PO), and (Vibegron (GEMTESA) 75 MG TABS)...

## 2022-04-13 NOTE — Progress Notes (Signed)
Patient ID: Correna Meacham, female   DOB: Apr 27, 1930, 88 y.o.   MRN: [id number]   Subjective:    Patient ID: [name], female    DOB: 02/26/1930, 88 y.o.   MRN: [id number]  This visit occurred during the SARS-CoV-2 public health emergency.  Safety protocols were in place, including screening questions prior to the visit, additional usage of staff PPE, and extensive cleaning of exam room while observing appropriate contact time as indicated for disinfecting solutions.   Patient here for  Chief Complaint  Patient presents with   Hypertension   .   HPI    Past Medical History:  Diagnosis Date   Actinic keratosis 01/28/2020   R thumb proximal nail fold    Atypical mole 05/13/2018   R distal lat thigh   Atypical mole 09/14/2014   left ant mid thigh   Atypical mole 01/19/2014   left paraspinal mid to upper back   Atypical mole 01/19/2014   right back mid to inf scapula   Atypical mole 01/19/2014   left posterior deltoid medial   Basal cell carcinoma 01/28/2020   left of midline mid dorsum nose   Basal cell carcinoma 01/06/2018   left preauricular    Basal cell carcinoma 11/22/2016   left upper lip   Basal cell carcinoma 03/17/2015   left lateral elbow   Basal cell carcinoma 07/22/2014   R medial inferior knee   Basal cell carcinoma 07/21/2020   Left dorsum hand. Nodular pattern. Excised 08/09/2020, margins free.   Basal cell carcinoma 02/23/2021   right dorsal hand (BCC/SCC), left dorsal hand, right chin - treated with EDC   Family history of breast cancer    History of breast cancer    History of pneumonia    Hypertension    Hypothyroidism    Macular degeneration    Squamous cell carcinoma of skin 01/06/2018   right dorsum hand at ring finger MCP   Squamous cell carcinoma of skin 07/08/2017   left distal dorsum forearm    Past Surgical History:  Procedure Laterality Date   ABDOMINAL HYSTERECTOMY     ABDOMINAL HYSTERECTOMY     APPENDECTOMY     BREAST LUMPECTOMY      CATARACT EXTRACTION, BILATERAL     CHOLECYSTECTOMY     CORONARY STENT INTERVENTION N/A 07/26/2020   Procedure: CORONARY STENT INTERVENTION;  Surgeon: Isaias Cowman, MD;  Location: [facility];  Service: Cardiovascular;  Laterality: N/A;   LEFT HEART CATH AND CORONARY ANGIOGRAPHY Left 07/26/2020   Procedure: LEFT HEART CATH AND CORONARY ANGIOGRAPHY;  Surgeon: Corey Skains, MD;  Location: [facility];  Service: Cardiovascular;  Laterality: Left;   REPLACEMENT TOTAL KNEE     TONSILLECTOMY AND ADENOIDECTOMY     Family History  Problem Relation Age of Onset   CAD Mother    Cancer Maternal Aunt        unk type   Leukemia Maternal Grandmother    Breast cancer Half-Sister        dx 44s   Social History   Socioeconomic History   Marital status: Widowed    Spouse name: Not on file   Number of children: Not on file   Years of education: Not on file   Highest education level: Not on file  Occupational History   Not on file  Tobacco Use   Smoking status: Former    Packs/day: 1.50    Years: 6.00    Total pack years: 9.00  Types: Cigarettes    Quit date: 76    Years since quitting: 48.5   Smokeless tobacco: Never  Substance and Sexual Activity   Alcohol use: No    Alcohol/week: 0.0 standard drinks of alcohol   Drug use: No   Sexual activity: Not on file  Other Topics Concern   Not on file  Social History Narrative   Married    Social Determinants of Health   Financial Resource Strain: Low Risk  (10/10/2021)   Overall Financial Resource Strain (CARDIA)    Difficulty of Paying Living Expenses: Not hard at all  Food Insecurity: No Food Insecurity (10/10/2021)   Hunger Vital Sign    Worried About Running Out of Food in the Last Year: Never true    Ran Out of Food in the Last Year: Never true  Transportation Needs: No Transportation Needs (10/10/2021)   PRAPARE - Hydrologist (Medical): No    Lack of Transportation  (Non-Medical): No  Physical Activity: Unknown (08/22/2020)   Exercise Vital Sign    Days of Exercise per Week: 0 days    Minutes of Exercise per Session: Not on file  Stress: No Stress Concern Present (10/10/2021)   [place]    Feeling of Stress : Not at all  Social Connections: Moderately Integrated (10/10/2021)   Social Connection and Isolation Panel [NHANES]    Frequency of Communication with Friends and Family: More than three times a week    Frequency of Social Gatherings with Friends and Family: More than three times a week    Attends Religious Services: More than 4 times per year    Active Member of [employer] or Organizations: Yes    Attends Archivist Meetings: More than 4 times per year    Marital Status: Widowed     Review of Systems     Objective:     BP 134/80 (BP Location: Left Arm, Patient Position: Sitting, Cuff Size: Small)   Pulse 75   Temp 97.9 F (36.6 C) (Temporal)   Resp 15   Ht '5\' 6"'$  (1.676 m)   Wt 113 lb (51.3 kg)   SpO2 96%   BMI 18.24 kg/m  Wt Readings from Last 3 Encounters:  04/13/22 113 lb (51.3 kg)  02/23/22 108 lb 9.6 oz (49.3 kg)  02/14/22 112 lb 12.8 oz (51.2 kg)    Physical Exam   Outpatient Encounter Medications as of 04/13/2022  Medication Sig   amLODipine (NORVASC) 10 MG tablet TAKE 1 TABLET(10 MG) BY MOUTH DAILY   aspirin EC 81 MG EC tablet Take 1 tablet (81 mg total) by mouth daily. Swallow whole.   atorvastatin (LIPITOR) 40 MG tablet TAKE 1 TABLET(40 MG) BY MOUTH DAILY   calcium citrate-vitamin D (CITRACAL+D) 315-200 MG-UNIT tablet Take 1 tablet by mouth 2 (two) times daily.   clopidogrel (PLAVIX) 75 MG tablet Take 1 tablet (75 mg total) by mouth daily.   ibandronate (BONIVA) 150 MG tablet Take 1 tablet (150 mg total) by mouth every 30 (thirty) days. Take in the morning with a full glass of water, on an empty stomach, and do not take anything else by mouth or  lie down for the next 30 min.   irbesartan (AVAPRO) 75 MG tablet Take 3 tablets (225 mg total) by mouth daily.   levothyroxine (SYNTHROID) 137 MCG tablet Take 1 tablet (137 mcg total) by mouth daily before breakfast.   metoprolol succinate (  TOPROL-XL) 50 MG 24 hr tablet TAKE 1 TABLET(50 MG) BY MOUTH EVERY DAY WITH OR IMMEDIATELY FOLLOWING A MEAL   Multiple Vitamin (MULTIVITAMIN) capsule Take 1 capsule by mouth daily.   Multiple Vitamins-Minerals (PRESERVISION AREDS 2 PO) Take 1 tablet by mouth 2 (two) times daily.    Polyethyl Glycol-Propyl Glycol (SYSTANE) 0.4-0.3 % SOLN Place 1 drop into both eyes in the morning, at noon, and at bedtime.   Vibegron (GEMTESA) 75 MG TABS Take 75 mg by mouth daily.   No facility-administered encounter medications on file as of 04/13/2022.     Lab Results  Component Value Date   WBC 5.8 02/15/2022   HGB 13.7 02/15/2022   HCT 41.9 02/15/2022   PLT 163 02/15/2022   GLUCOSE 88 02/15/2022   CHOL 126 02/14/2022   TRIG 28 02/14/2022   HDL 63 02/14/2022   LDLCALC 57 02/14/2022   ALT 18 01/02/2022   AST 33 01/02/2022   NA 140 02/15/2022   K 3.6 02/15/2022   CL 103 02/15/2022   CREATININE 0.79 02/15/2022   BUN 18 02/15/2022   CO2 33 (H) 02/15/2022   TSH 0.943 02/14/2022   INR 1.1 02/13/2022   HGBA1C 5.6 02/13/2022    ECHOCARDIOGRAM COMPLETE  Result Date: 02/14/2022    ECHOCARDIOGRAM REPORT   Patient Name:   DARRELL LEONHARDT Date of Exam: 02/14/2022 Medical Rec #:  [id number]    Height:       66.0 in Accession #:    [id number]   Weight:       112.8 lb Date of Birth:  1929-12-29    BSA:          1.568 m Patient Age:    7 years     BP:           146/71 mmHg Patient Gender: F            HR:           58 bpm. Exam Location:  [facility] Procedure: 2D Echo, Cardiac Doppler and Color Doppler Indications:     NSTEMI I21.4  History:         Patient has no prior history of Echocardiogram examinations.                  Risk Factors:Hypertension.  Sonographer:     Sherrie Sport  Referring Phys:  [id number] [place] TANG Diagnosing Phys: Serafina Royals MD IMPRESSIONS  1. Left ventricular ejection fraction, by estimation, is 55 to 60%. The left ventricle has normal function. The left ventricle has no regional wall motion abnormalities. There is mild left ventricular hypertrophy. Left ventricular diastolic parameters were normal.  2. Right ventricular systolic function is normal. The right ventricular size is normal.  3. The mitral valve is normal in structure. Mild mitral valve regurgitation.  4. The aortic valve is normal in structure. Aortic valve regurgitation is mild. FINDINGS  Left Ventricle: Left ventricular ejection fraction, by estimation, is 55 to 60%. The left ventricle has normal function. The left ventricle has no regional wall motion abnormalities. The left ventricular internal cavity size was normal in size. There is  mild left ventricular hypertrophy. Left ventricular diastolic parameters were normal. Right Ventricle: The right ventricular size is normal. No increase in right ventricular wall thickness. Right ventricular systolic function is normal. Left Atrium: Left atrial size was normal in size. Right Atrium: Right atrial size was normal in size. Pericardium: There is no evidence of pericardial effusion. Mitral Valve: The mitral  valve is normal in structure. Mild mitral valve regurgitation. MV peak gradient, 3.7 mmHg. The mean mitral valve gradient is 1.0 mmHg. Tricuspid Valve: The tricuspid valve is normal in structure. Tricuspid valve regurgitation is mild. Aortic Valve: The aortic valve is normal in structure. Aortic valve regurgitation is mild. Aortic regurgitation PHT measures 547 msec. Aortic valve mean gradient measures 4.5 mmHg. Aortic valve peak gradient measures 8.4 mmHg. Aortic valve area, by VTI measures 2.13 cm. Pulmonic Valve: The pulmonic valve was normal in structure. Pulmonic valve regurgitation is trivial. Aorta: The aortic root and ascending aorta are  structurally normal, with no evidence of dilitation. IAS/Shunts: No atrial level shunt detected by color flow Doppler.  LEFT VENTRICLE PLAX 2D LVIDd:         4.60 cm   Diastology LVIDs:         2.90 cm   LV e' medial:    4.57 cm/s LV PW:         1.20 cm   LV E/e' medial:  16.4 LV IVS:        0.90 cm   LV e' lateral:   5.98 cm/s LVOT diam:     2.00 cm   LV E/e' lateral: 12.5 LV SV:         64 LV SV Index:   41 LVOT Area:     3.14 cm  RIGHT VENTRICLE RV Basal diam:  3.20 cm RV S prime:     11.00 cm/s TAPSE (M-mode): 2.6 cm LEFT ATRIUM             Index        RIGHT ATRIUM           Index LA diam:        2.60 cm 1.66 cm/m   RA Area:     13.60 cm LA Vol (A2C):   23.8 ml 15.18 ml/m  RA Volume:   40.20 ml  25.64 ml/m LA Vol (A4C):   33.4 ml 21.30 ml/m LA Biplane Vol: 30.7 ml 19.58 ml/m  AORTIC VALVE                    PULMONIC VALVE AV Area (Vmax):    1.84 cm     PV Vmax:        0.77 m/s AV Area (Vmean):   1.77 cm     PV Vmean:       57.900 cm/s AV Area (VTI):     2.13 cm     PV VTI:         0.163 m AV Vmax:           145.00 cm/s  PV Peak grad:   2.4 mmHg AV Vmean:          97.150 cm/s  PV Mean grad:   2.0 mmHg AV VTI:            0.300 m      RVOT Peak grad: 4 mmHg AV Peak Grad:      8.4 mmHg AV Mean Grad:      4.5 mmHg LVOT Vmax:         84.90 cm/s LVOT Vmean:        54.600 cm/s LVOT VTI:          0.204 m LVOT/AV VTI ratio: 0.68 AI PHT:            547 msec  AORTA Ao Root diam: 2.90 cm MITRAL VALVE  TRICUSPID VALVE MV Area (PHT): 3.68 cm    TR Peak grad:   22.3 mmHg MV Area VTI:   2.34 cm    TR Vmax:        236.00 cm/s MV Peak grad:  3.7 mmHg MV Mean grad:  1.0 mmHg    SHUNTS MV Vmax:       0.96 m/s    Systemic VTI:  0.20 m MV Vmean:      54.3 cm/s   Systemic Diam: 2.00 cm MV Decel Time: 206 msec    Pulmonic VTI:  0.149 m MV E velocity: 75.00 cm/s MV A velocity: 87.40 cm/s MV E/A ratio:  0.86 Serafina Royals MD Electronically signed by Serafina Royals MD Signature Date/Time: 02/14/2022/9:12:34 AM     Final        Assessment & Plan:   Problem List Items Addressed This Visit     Hypercholesterolemia - Primary     Einar Pheasant, MD

## 2022-04-15 ENCOUNTER — Telehealth: Payer: Self-pay | Admitting: Internal Medicine

## 2022-04-15 ENCOUNTER — Encounter: Payer: Self-pay | Admitting: Internal Medicine

## 2022-04-15 NOTE — Assessment & Plan Note (Signed)
Should be on lipitor now. Need to confirm taking.  Follow lipid panel and liver function tests. Call back with medication list.

## 2022-04-15 NOTE — Assessment & Plan Note (Signed)
Per review, should be on lipitor.  Need to confirm taking.

## 2022-04-15 NOTE — Assessment & Plan Note (Signed)
No increased sob.  Breathing stable.  Actually reports her breathing has improved.

## 2022-04-15 NOTE — Assessment & Plan Note (Signed)
Saw Dr Brandon 01/2022.   CT scan is unchanged from previous scan, 6 months ago which is reassuring with no more likely indolent malignancy. Continue to recommend active surveillance Plan for repeat scan in 9 months  

## 2022-04-15 NOTE — Assessment & Plan Note (Signed)
S/p lumpectomy.  Declines mammogram.

## 2022-04-15 NOTE — Assessment & Plan Note (Addendum)
Continue avapro, amlodipine and restart metoprolol.  Has been off this medication.  Not able to get at the pharmacy.  Apparently refill request being sent to a different provider.  Corrected and refill sent in. Follow pressures. Follow metabolic panel.

## 2022-04-15 NOTE — Assessment & Plan Note (Signed)
On thyroid replacement.  Follow tsh.  

## 2022-04-15 NOTE — Assessment & Plan Note (Signed)
Recent admission for NSTEMI.  ECHO with no wall motion abnormality.  EF 60%.  Recommended dual antiplatelet therapy and statin medication.  Need to confirm medications currently taking.  No chest pain.  Follow. Continue risk factor modification.  F/u with Dr Nehemiah Massed.

## 2022-04-15 NOTE — Telephone Encounter (Signed)
Recently admitted with non ST elevation MI.  Sees Dr Nehemiah Massed.  Needs a f/u appt scheduled with him [facility] cardiology).  She is doing well, just needs f/u scheduled.  Also, she was instructed to call back with medications. In reviewing, she has not had a new rx for atorvastatin since 11/2020.  Was trying to confirm what medication she is actually taking.  May need to get her to read her actual bottles taking and how taking.

## 2022-04-16 NOTE — Telephone Encounter (Signed)
See my message on [employer] in your box.  She reports taking lipitor, but I need to confirm.  Per review of records, she has not have refilled since 2022.  Please see previous note and have her clarify her medications.

## 2022-04-19 ENCOUNTER — Other Ambulatory Visit: Payer: Self-pay

## 2022-04-19 MED ORDER — IRBESARTAN 75 MG PO TABS: 75.0000 mg | ORAL_TABLET | Freq: Every day | ORAL | 3 refills | Status: DC

## 2022-04-19 NOTE — Telephone Encounter (Signed)
Noted.  Needs to restart plavix given recent admission with non ST MI.

## 2022-04-19 NOTE — Telephone Encounter (Signed)
S/w Mrs Mclarty - had her gather bottles of medication and read off the labels to me.  Patients reported medications are as follows :  Irbesartan '150mg'$  1 in the morning Irbesartan '75mg'$  1 in the evening Aspirin 81 mg 1 QD Atorvastatin '40mg'$  1 QD Levothyroxine '137mg'$  1 QD Amlodipine '10mg'$  1 QD Gemtesa '75mg'$  1 QD Metoprolol '50mg'$  1 QD Preservision 1 QD Multivitamin 1 QD Calcium + D 1 BID Ibandronate - 1 per month  I also called [facility] to confirm medications with them. I asked specifically about Atorvastatin - looks like it was being filled for pt by a Dr Petra Kuba ? Last picked up 5/6 for 90 day supply , previous to that picked up on 2/3 for 90 day supply. Also confirmed that pt does not have pending rx for Plavix as she did not have that at home - per pharmacist stated no , only rx called in was in May for a 30 day supply. No refills, no scripts on hold or waiting to be picked up.   Medication list updated in system.

## 2022-04-20 ENCOUNTER — Other Ambulatory Visit: Payer: Self-pay

## 2022-04-20 MED ORDER — CLOPIDOGREL BISULFATE 75 MG PO TABS: 75.0000 mg | ORAL_TABLET | Freq: Every day | ORAL | 1 refills | Status: DC

## 2022-04-20 NOTE — Telephone Encounter (Signed)
Pt advised re-start plavix - rx sent to [facility] at pt request

## 2022-05-28 ENCOUNTER — Other Ambulatory Visit (INDEPENDENT_AMBULATORY_CARE_PROVIDER_SITE_OTHER): Payer: Medicare Other

## 2022-05-28 DIAGNOSIS — I10 Essential (primary) hypertension: Secondary | ICD-10-CM | POA: Diagnosis not present

## 2022-05-28 DIAGNOSIS — E7800 Pure hypercholesterolemia, unspecified: Secondary | ICD-10-CM

## 2022-05-28 DIAGNOSIS — E039 Hypothyroidism, unspecified: Secondary | ICD-10-CM

## 2022-05-28 LAB — LIPID PANEL
Cholesterol: 138 mg/dL (ref 0–200)
HDL: 65.8 mg/dL (ref >39.00)
LDL Cholesterol: 60 mg/dL (ref 0–99)
NonHDL: 72.36
Total CHOL/HDL Ratio: 2
Triglycerides: 61 mg/dL (ref 0.0–149.0)
VLDL: 12.2 mg/dL (ref 0.0–40.0)

## 2022-05-28 LAB — BASIC METABOLIC PANEL
BUN: 18 mg/dL (ref 6–23)
CO2: 36 mEq/L — ABNORMAL HIGH (ref 19–32)
Calcium: 8.8 mg/dL (ref 8.4–10.5)
Chloride: 100 mEq/L (ref 96–112)
Creatinine, Ser: 0.83 mg/dL (ref 0.40–1.20)
GFR: 61.99 mL/min (ref >60.00)
Glucose, Bld: 88 mg/dL (ref 70–99)
Potassium: 4 mEq/L (ref 3.5–5.1)
Sodium: 144 mEq/L (ref 135–145)

## 2022-05-28 LAB — HEPATIC FUNCTION PANEL
ALT: 24 U/L (ref 0–35)
AST: 40 U/L — ABNORMAL HIGH (ref 0–37)
Albumin: 3.7 g/dL (ref 3.5–5.2)
Alkaline Phosphatase: 79 U/L (ref 39–117)
Bilirubin, Direct: 0.2 mg/dL (ref 0.0–0.3)
Total Bilirubin: 1 mg/dL (ref 0.2–1.2)
Total Protein: 6.3 g/dL (ref 6.0–8.3)

## 2022-05-28 LAB — TSH: TSH: 4.96 u[IU]/mL (ref 0.35–5.50)

## 2022-05-31 ENCOUNTER — Encounter: Payer: Self-pay | Admitting: Internal Medicine

## 2022-05-31 ENCOUNTER — Ambulatory Visit (INDEPENDENT_AMBULATORY_CARE_PROVIDER_SITE_OTHER): Payer: Medicare Other | Admitting: Internal Medicine

## 2022-05-31 DIAGNOSIS — I700 Atherosclerosis of aorta: Secondary | ICD-10-CM

## 2022-05-31 DIAGNOSIS — Z853 Personal history of malignant neoplasm of breast: Secondary | ICD-10-CM

## 2022-05-31 DIAGNOSIS — I10 Essential (primary) hypertension: Secondary | ICD-10-CM

## 2022-05-31 DIAGNOSIS — Z0000 Encounter for general adult medical examination without abnormal findings: Secondary | ICD-10-CM

## 2022-05-31 DIAGNOSIS — R7989 Other specified abnormal findings of blood chemistry: Secondary | ICD-10-CM

## 2022-05-31 DIAGNOSIS — I2510 Atherosclerotic heart disease of native coronary artery without angina pectoris: Secondary | ICD-10-CM | POA: Diagnosis not present

## 2022-05-31 DIAGNOSIS — I4949 Other premature depolarization: Secondary | ICD-10-CM

## 2022-05-31 DIAGNOSIS — J479 Bronchiectasis, uncomplicated: Secondary | ICD-10-CM

## 2022-05-31 DIAGNOSIS — E039 Hypothyroidism, unspecified: Secondary | ICD-10-CM

## 2022-05-31 DIAGNOSIS — E7800 Pure hypercholesterolemia, unspecified: Secondary | ICD-10-CM

## 2022-05-31 DIAGNOSIS — R351 Nocturia: Secondary | ICD-10-CM

## 2022-05-31 DIAGNOSIS — R5383 Other fatigue: Secondary | ICD-10-CM

## 2022-05-31 NOTE — Assessment & Plan Note (Signed)
Physical today 05/31/22.

## 2022-05-31 NOTE — Progress Notes (Addendum)
[name], female   DOB: [birth year], 88 y.o.   MRN: [id number]   Subjective:    [name], female    DOB: [birth year], 88 y.o.   MRN: [id number]   HPI With past history of hypertension and hypercholesterolemia.  She comes in today to follow up on these issues as well as for a complete physical exam.  Saw cardiology in follow up 05/21/22 - f/u CAD (NSTEMI) - previous echo - normal EF.  Recommended continuing dual antiplatelet therapy with aspirin and plavix.  States she is doing relatively well.  No chest pain reported.  Breathing overall stable.  Does report some fatigue.  Also nocturia (3x).  Unsure if snores.  Discussed sleep apnea.  Agreeable for check.  No increased urination through the day.  Blood pressures on outside checks - <462 systolic.  Eating.  States has a good appetite.  No abdominal pain or bowel change reported.     Past Medical History:  Diagnosis Date   Actinic keratosis 01/28/2020   R thumb proximal nail fold    Atypical mole 05/13/2018   R distal lat thigh   Atypical mole 09/14/2014   left ant mid thigh   Atypical mole 01/19/2014   left paraspinal mid to upper back   Atypical mole 01/19/2014   right back mid to inf scapula   Atypical mole 01/19/2014   left posterior deltoid medial   Basal cell carcinoma 01/28/2020   left of midline mid dorsum nose   Basal cell carcinoma 01/06/2018   left preauricular    Basal cell carcinoma 11/22/2016   left upper lip   Basal cell carcinoma 03/17/2015   left lateral elbow   Basal cell carcinoma 07/22/2014   R medial inferior knee   Basal cell carcinoma 07/21/2020   Left dorsum hand. Nodular pattern. Excised 08/09/2020, margins free.   Basal cell carcinoma 02/23/2021   right dorsal hand (BCC/SCC), left dorsal hand, right chin - treated with EDC   Family history of breast cancer    History of breast cancer    History of pneumonia    Hypertension    Hypothyroidism    Macular degeneration     Squamous cell carcinoma of skin 01/06/2018   right dorsum hand at ring finger MCP   Squamous cell carcinoma of skin 07/08/2017   left distal dorsum forearm    Past Surgical History:  Procedure Laterality Date   ABDOMINAL HYSTERECTOMY     ABDOMINAL HYSTERECTOMY     APPENDECTOMY     BREAST LUMPECTOMY     CATARACT EXTRACTION, BILATERAL     CHOLECYSTECTOMY     CORONARY STENT INTERVENTION N/A 07/26/2020   Procedure: CORONARY STENT INTERVENTION;  Surgeon: Isaias Cowman, MD;  Location: [facility];  Service: Cardiovascular;  Laterality: N/A;   LEFT HEART CATH AND CORONARY ANGIOGRAPHY Left 07/26/2020   Procedure: LEFT HEART CATH AND CORONARY ANGIOGRAPHY;  Surgeon: Corey Skains, MD;  Location: [facility];  Service: Cardiovascular;  Laterality: Left;   REPLACEMENT TOTAL KNEE     TONSILLECTOMY AND ADENOIDECTOMY     Family History  Problem Relation Age of Onset   CAD Mother    Cancer Maternal Aunt        unk type   Leukemia Maternal Grandmother    Breast cancer Half-Sister        dx 35s   Social History   Socioeconomic History   Marital status: Widowed    Spouse name:  Not on file   Number of children: Not on file   Years of education: Not on file   Highest education level: Not on file  Occupational History   Not on file  Tobacco Use   Smoking status: Former    Packs/day: 1.50    Years: 6.00    Total pack years: 9.00    Types: Cigarettes    Quit date: 81    Years since quitting: 48.6   Smokeless tobacco: Never  Substance and Sexual Activity   Alcohol use: No    Alcohol/week: 0.0 standard drinks of alcohol   Drug use: No   Sexual activity: Not on file  Other Topics Concern   Not on file  Social History Narrative   Married    Social Determinants of Health   Financial Resource Strain: Low Risk  (10/10/2021)   Overall Financial Resource Strain (CARDIA)    Difficulty of Paying Living Expenses: Not hard at all  Food Insecurity: No Food  Insecurity (10/10/2021)   Hunger Vital Sign    Worried About Running Out of Food in the Last Year: Never true    [place] in the Last Year: Never true  Transportation Needs: No Transportation Needs (10/10/2021)   PRAPARE - Hydrologist (Medical): No    Lack of Transportation (Non-Medical): No  Physical Activity: Unknown (08/22/2020)   Exercise Vital Sign    Days of Exercise per Week: 0 days    Minutes of Exercise per Session: Not on file  Stress: No Stress Concern Present (10/10/2021)   [place]    Feeling of Stress : Not at all  Social Connections: Moderately Integrated (10/10/2021)   Social Connection and Isolation Panel [NHANES]    Frequency of Communication with Friends and Family: More than three times a week    Frequency of Social Gatherings with Friends and Family: More than three times a week    Attends Religious Services: More than 4 times per year    Active Member of [employer] or Organizations: Yes    Attends Archivist Meetings: More than 4 times per year    Marital Status: Widowed     Review of Systems  Constitutional:  Positive for fatigue. Negative for appetite change.  HENT:  Negative for congestion, sinus pressure and sore throat.   Eyes:  Negative for pain and visual disturbance.  Respiratory:  Negative for cough and chest tightness.        Breathing stable.   Cardiovascular:  Negative for chest pain, palpitations and leg swelling.  Gastrointestinal:  Negative for abdominal pain, diarrhea, nausea and vomiting.  Genitourinary:  Negative for difficulty urinating and dysuria.       Nocturia  Musculoskeletal:  Negative for joint swelling and myalgias.  Skin:  Negative for color change and rash.  Neurological:  Negative for dizziness, light-headedness and headaches.  Hematological:  Negative for adenopathy. Does not bruise/bleed easily.  Psychiatric/Behavioral:   Negative for agitation and dysphoric mood.        Objective:     BP 134/72   Pulse 70   Temp 97.7 F (36.5 C) (Temporal)   Resp 17   Ht '5\' 6"'$  (1.676 m)   Wt 111 lb 6.4 oz (50.5 kg)   SpO2 95%   BMI 17.98 kg/m  Wt Readings from Last 3 Encounters:  05/31/22 111 lb 6.4 oz (50.5 kg)  04/13/22 113 lb (51.3 kg)  02/23/22 108 lb 9.6 oz (49.3 kg)    Physical Exam Vitals reviewed.  Constitutional:      General: She is not in acute distress.    Appearance: Normal appearance. She is well-developed.  HENT:     Head: Normocephalic and atraumatic.     Right Ear: External ear normal.     Left Ear: External ear normal.  Eyes:     General: No scleral icterus.       Right eye: No discharge.        Left eye: No discharge.     Conjunctiva/sclera: Conjunctivae normal.  Neck:     Thyroid: No thyromegaly.  Cardiovascular:     Rate and Rhythm: Normal rate and regular rhythm.     Comments: Appears to have regular rhythm with premature beats.  Pulmonary:     Effort: No tachypnea, accessory muscle usage or respiratory distress.     Breath sounds: Normal breath sounds. No decreased breath sounds or wheezing.  Chest:  Breasts:    Right: No inverted nipple, mass, nipple discharge or tenderness (no axillary adenopathy).     Left: No inverted nipple, mass, nipple discharge or tenderness (no axilarry adenopathy).  Abdominal:     General: Bowel sounds are normal.     Palpations: Abdomen is soft.     Tenderness: There is no abdominal tenderness.  Musculoskeletal:        General: No swelling or tenderness.     Cervical back: Neck supple.  Lymphadenopathy:     Cervical: No cervical adenopathy.  Skin:    Findings: No erythema or rash.  Neurological:     Mental Status: She is alert and oriented to person, place, and time.  Psychiatric:        Mood and Affect: Mood normal.        Behavior: Behavior normal.      Outpatient Encounter Medications as of 05/31/2022  Medication Sig    amLODipine (NORVASC) 10 MG tablet TAKE 1 TABLET(10 MG) BY MOUTH DAILY   aspirin EC 81 MG EC tablet Take 1 tablet (81 mg total) by mouth daily. Swallow whole.   atorvastatin (LIPITOR) 40 MG tablet TAKE 1 TABLET(40 MG) BY MOUTH DAILY   calcium citrate-vitamin D (CITRACAL+D) 315-200 MG-UNIT tablet Take 1 tablet by mouth 2 (two) times daily.   clopidogrel (PLAVIX) 75 MG tablet Take 1 tablet (75 mg total) by mouth daily.   ibandronate (BONIVA) 150 MG tablet Take 1 tablet (150 mg total) by mouth every 30 (thirty) days. Take in the morning with a full glass of water, on an empty stomach, and do not take anything else by mouth or lie down for the next 30 min.   irbesartan (AVAPRO) 150 MG tablet Take 150 mg by mouth daily.   irbesartan (AVAPRO) 75 MG tablet Take 1 tablet (75 mg total) by mouth daily.   levothyroxine (SYNTHROID) 137 MCG tablet Take 1 tablet (137 mcg total) by mouth daily before breakfast.   metoprolol succinate (TOPROL-XL) 50 MG 24 hr tablet TAKE 1 TABLET(50 MG) BY MOUTH EVERY DAY WITH OR IMMEDIATELY FOLLOWING A MEAL   Multiple Vitamin (MULTIVITAMIN) capsule Take 1 capsule by mouth daily.   Multiple Vitamins-Minerals (PRESERVISION AREDS 2 PO) Take 1 tablet by mouth 2 (two) times daily.    Vibegron (GEMTESA) 75 MG TABS Take 75 mg by mouth daily.   No facility-administered encounter medications on file as of 05/31/2022.     Lab Results  Component Value Date   WBC  5.8 02/15/2022   HGB 13.7 02/15/2022   HCT 41.9 02/15/2022   PLT 163 02/15/2022   GLUCOSE 88 05/28/2022   CHOL 138 05/28/2022   TRIG 61.0 05/28/2022   HDL 65.80 05/28/2022   LDLCALC 60 05/28/2022   ALT 24 05/28/2022   AST 40 (H) 05/28/2022   NA 144 05/28/2022   K 4.0 05/28/2022   CL 100 05/28/2022   CREATININE 0.83 05/28/2022   BUN 18 05/28/2022   CO2 36 (H) 05/28/2022   TSH 4.96 05/28/2022   INR 1.1 02/13/2022   HGBA1C 5.6 02/13/2022    ECHOCARDIOGRAM COMPLETE  Result Date: 02/14/2022    ECHOCARDIOGRAM REPORT    Patient Name:   DELYNN OLVERA Date of Exam: 02/14/2022 Medical Rec #:  [id number]    Height:       66.0 in Accession #:    [id number]   Weight:       112.8 lb Date of Birth:  July 16, 1930    BSA:          1.568 m Patient Age:    27 years     BP:           146/71 mmHg Patient Gender: F            HR:           58 bpm. Exam Location:  [facility] Procedure: 2D Echo, Cardiac Doppler and Color Doppler Indications:     NSTEMI I21.4  History:         Patient has no prior history of Echocardiogram examinations.                  Risk Factors:Hypertension.  Sonographer:     Sherrie Sport Referring Phys:  [id number] Herbst TANG Diagnosing Phys: Serafina Royals MD IMPRESSIONS  1. Left ventricular ejection fraction, by estimation, is 55 to 60%. The left ventricle has normal function. The left ventricle has no regional wall motion abnormalities. There is mild left ventricular hypertrophy. Left ventricular diastolic parameters were normal.  2. Right ventricular systolic function is normal. The right ventricular size is normal.  3. The mitral valve is normal in structure. Mild mitral valve regurgitation.  4. The aortic valve is normal in structure. Aortic valve regurgitation is mild. FINDINGS  Left Ventricle: Left ventricular ejection fraction, by estimation, is 55 to 60%. The left ventricle has normal function. The left ventricle has no regional wall motion abnormalities. The left ventricular internal cavity size was normal in size. There is  mild left ventricular hypertrophy. Left ventricular diastolic parameters were normal. Right Ventricle: The right ventricular size is normal. No increase in right ventricular wall thickness. Right ventricular systolic function is normal. Left Atrium: Left atrial size was normal in size. Right Atrium: Right atrial size was normal in size. Pericardium: There is no evidence of pericardial effusion. Mitral Valve: The mitral valve is normal in structure. Mild mitral valve regurgitation. MV peak gradient, 3.7  mmHg. The mean mitral valve gradient is 1.0 mmHg. Tricuspid Valve: The tricuspid valve is normal in structure. Tricuspid valve regurgitation is mild. Aortic Valve: The aortic valve is normal in structure. Aortic valve regurgitation is mild. Aortic regurgitation PHT measures 547 msec. Aortic valve mean gradient measures 4.5 mmHg. Aortic valve peak gradient measures 8.4 mmHg. Aortic valve area, by VTI measures 2.13 cm. Pulmonic Valve: The pulmonic valve was normal in structure. Pulmonic valve regurgitation is trivial. Aorta: The aortic root and ascending aorta are structurally normal, with no evidence of dilitation. IAS/Shunts:  No atrial level shunt detected by color flow Doppler.  LEFT VENTRICLE PLAX 2D LVIDd:         4.60 cm   Diastology LVIDs:         2.90 cm   LV e' medial:    4.57 cm/s LV PW:         1.20 cm   LV E/e' medial:  16.4 LV IVS:        0.90 cm   LV e' lateral:   5.98 cm/s LVOT diam:     2.00 cm   LV E/e' lateral: 12.5 LV SV:         64 LV SV Index:   41 LVOT Area:     3.14 cm  RIGHT VENTRICLE RV Basal diam:  3.20 cm RV S prime:     11.00 cm/s TAPSE (M-mode): 2.6 cm LEFT ATRIUM             Index        RIGHT ATRIUM           Index LA diam:        2.60 cm 1.66 cm/m   RA Area:     13.60 cm LA Vol (A2C):   23.8 ml 15.18 ml/m  RA Volume:   40.20 ml  25.64 ml/m LA Vol (A4C):   33.4 ml 21.30 ml/m LA Biplane Vol: 30.7 ml 19.58 ml/m  AORTIC VALVE                    PULMONIC VALVE AV Area (Vmax):    1.84 cm     PV Vmax:        0.77 m/s AV Area (Vmean):   1.77 cm     PV Vmean:       57.900 cm/s AV Area (VTI):     2.13 cm     PV VTI:         0.163 m AV Vmax:           145.00 cm/s  PV Peak grad:   2.4 mmHg AV Vmean:          97.150 cm/s  PV Mean grad:   2.0 mmHg AV VTI:            0.300 m      RVOT Peak grad: 4 mmHg AV Peak Grad:      8.4 mmHg AV Mean Grad:      4.5 mmHg LVOT Vmax:         84.90 cm/s LVOT Vmean:        54.600 cm/s LVOT VTI:          0.204 m LVOT/AV VTI ratio: 0.68 AI PHT:            547  msec  AORTA Ao Root diam: 2.90 cm MITRAL VALVE               TRICUSPID VALVE MV Area (PHT): 3.68 cm    TR Peak grad:   22.3 mmHg MV Area VTI:   2.34 cm    TR Vmax:        236.00 cm/s MV Peak grad:  3.7 mmHg MV Mean grad:  1.0 mmHg    SHUNTS MV Vmax:       0.96 m/s    Systemic VTI:  0.20 m MV Vmean:      54.3 cm/s   Systemic Diam: 2.00 cm MV Decel Time: 206 msec    Pulmonic VTI:  0.149 m MV E velocity: 75.00 cm/s MV A velocity: 87.40 cm/s MV E/A ratio:  0.86 Serafina Royals MD Electronically signed by Serafina Royals MD Signature Date/Time: 02/14/2022/9:12:34 AM    Final        Assessment & Plan:   Problem List Items Addressed This Visit     Abnormal liver function test    Slight increased liver test.  Recheck liver panel soon to confirm wnl.       Relevant Orders   Hepatic function panel   Aortic atherosclerosis (HCC)    Continue lipitor.       Bronchiectasis without complication (HCC)    No increased sob.  Breathing stable.       CAD (coronary artery disease)    Recent admission for NSTEMI.  ECHO with no wall motion abnormality.  EF 60%.  Recommended dual antiplatelet therapy and statin medication.  No chest pain.  Follow. Continue risk factor modification.  Request f/u with [facility] cardiology.       Relevant Orders   Ambulatory referral to Cardiology   Fatigue    Reports increased fatigue with increased nocturia.  Known hypertension and CAD.  Discussed evaluation for possible sleep apnea.  She is agreeable.  Refer to pulmonary for evaluation.       Health care maintenance    Physical today 05/31/22.       History of breast cancer    S/p lumpectomy.  Declines mammogram.       Hypercholesterolemia    On lipitor. Follow lipid panel and liver function tests.       Hypertension    Continues avapro, amlodipine and metoprolol. (she is taking '150mg'$  + '75mg'$  of avapro - since discharge).  Will hold on making changes. Follow pressures. Follow metabolic panel.       Hypothyroidism     On thyroid replacement.  Follow tsh.       Nocturia    Reports increased fatigue with increased nocturia.  Known hypertension and CAD.  Discussed evaluation for possible sleep apnea.  She is agreeable.  Refer to pulmonary for evaluation.       Premature beats    On exam, appeared to have regular rhythm with premature beats. EKG obtained to confirm no irregular rhythm.  Pt denies any increased heart rate or palpitations.  EKG - SR with PAC.        Relevant Orders   EKG 12-Lead (Completed)     Einar Pheasant, MD

## 2022-06-03 ENCOUNTER — Encounter: Payer: Self-pay | Admitting: Internal Medicine

## 2022-06-03 DIAGNOSIS — R351 Nocturia: Secondary | ICD-10-CM | POA: Insufficient documentation

## 2022-06-03 DIAGNOSIS — R5383 Other fatigue: Secondary | ICD-10-CM | POA: Insufficient documentation

## 2022-06-03 NOTE — Assessment & Plan Note (Signed)
On exam, appeared to have regular rhythm with premature beats. EKG obtained to confirm no irregular rhythm.  Pt denies any increased heart rate or palpitations.  EKG - SR with PAC.

## 2022-06-03 NOTE — Assessment & Plan Note (Signed)
On lipitor.  Follow lipid panel and liver function tests.   

## 2022-06-03 NOTE — Assessment & Plan Note (Signed)
Reports increased fatigue with increased nocturia.  Known hypertension and CAD.  Discussed evaluation for possible sleep apnea.  She is agreeable.  Refer to pulmonary for evaluation.

## 2022-06-03 NOTE — Assessment & Plan Note (Signed)
Slight increased liver test.  Recheck liver panel soon to confirm wnl.

## 2022-06-03 NOTE — Assessment & Plan Note (Signed)
Continues avapro, amlodipine and metoprolol. (she is taking '150mg'$  + '75mg'$  of avapro - since discharge).  Will hold on making changes. Follow pressures. Follow metabolic panel.

## 2022-06-03 NOTE — Assessment & Plan Note (Signed)
On thyroid replacement.  Follow tsh.  

## 2022-06-03 NOTE — Assessment & Plan Note (Signed)
Continue lipitor  ?

## 2022-06-03 NOTE — Assessment & Plan Note (Signed)
S/p lumpectomy.  Declines mammogram.

## 2022-06-03 NOTE — Assessment & Plan Note (Signed)
Recent admission for NSTEMI.  ECHO with no wall motion abnormality.  EF 60%.  Recommended dual antiplatelet therapy and statin medication.  No chest pain.  Follow. Continue risk factor modification.  Request f/u with [facility] cardiology.

## 2022-06-03 NOTE — Assessment & Plan Note (Signed)
No increased sob.  Breathing stable.  

## 2022-06-05 ENCOUNTER — Telehealth: Payer: Self-pay | Admitting: Internal Medicine

## 2022-06-05 NOTE — Telephone Encounter (Signed)
Received this message from Apolonio Schneiders - Dr. Nicki Reaper, I just had a visit with Alexa Pham. She has a productive cough some dyspnea with exertion and her SpO2 is 88-92%. No fever or other symptoms. Was wondering if she can get in for an in person evaluation today. Please call and see how she is doing.  Let her know that I received information with request for evaluation today.   Please see if available openings.

## 2022-06-05 NOTE — Telephone Encounter (Signed)
I spoke with patient to express concern & she was willing to go to [facility] walk-in today. Directions given on how to find. I asked that she update [place] on how she is doing & let her know we would call back as well.

## 2022-06-14 ENCOUNTER — Ambulatory Visit (INDEPENDENT_AMBULATORY_CARE_PROVIDER_SITE_OTHER): Payer: Medicare Other | Admitting: Internal Medicine

## 2022-06-14 ENCOUNTER — Encounter: Payer: Self-pay | Admitting: Internal Medicine

## 2022-06-14 DIAGNOSIS — R059 Cough, unspecified: Secondary | ICD-10-CM | POA: Diagnosis not present

## 2022-06-14 DIAGNOSIS — I2510 Atherosclerotic heart disease of native coronary artery without angina pectoris: Secondary | ICD-10-CM | POA: Diagnosis not present

## 2022-06-14 DIAGNOSIS — I10 Essential (primary) hypertension: Secondary | ICD-10-CM | POA: Diagnosis not present

## 2022-06-14 MED ORDER — ALBUTEROL SULFATE HFA 108 (90 BASE) MCG/ACT IN AERS: 2.0000 | INHALATION_SPRAY | Freq: Four times a day (QID) | RESPIRATORY_TRACT | 0 refills | Status: DC | PRN

## 2022-06-14 MED ORDER — PREDNISONE 10 MG PO TABS: ORAL_TABLET | ORAL | 0 refills | Status: DC

## 2022-06-14 MED ORDER — AZITHROMYCIN 250 MG PO TABS: ORAL_TABLET | ORAL | 0 refills | Status: AC

## 2022-06-14 NOTE — Patient Instructions (Addendum)
Robitussin DM - twice a day as needed for cough and congestion.   Examples of probiotics - culturelle, align or florastor  Take a probiotic daily while on antibiotics and for two weeks after completing antibiotics.   Take the antibiotic and prednisone as directed

## 2022-06-14 NOTE — Progress Notes (Signed)
Walked patient up and down the hall a couple times first time it did not register and I let her sit down and relax a min and her O2 went up to 94 so we went for another walk up the hall and back and it got up to 91 this time.

## 2022-06-14 NOTE — Progress Notes (Signed)
[name], female   DOB: [birth year], 88 y.o.   MRN: [id number]   Subjective:    [name], female    DOB: [birth year], 88 y.o.   MRN: [id number]   Patient here for work in appt Chief Complaint  Patient presents with   Cough   .   HPI Work in for persistent cough.  Started a few weeks ago. Was seen acute care 06/05/22 - cough. Was treated with avelox, prednisone and tessalon perles.  Reports cxr ok.  Comes in today accompanied by her son.  History obtained from both of them.  Persistent increased cough.  Fatigue.  Not sleeping well.  Eating.  No vomiting.  Pulse ox 92-94%.  No chest pain.  She reports breathing is stable.     Past Medical History:  Diagnosis Date   Actinic keratosis 01/28/2020   R thumb proximal nail fold    Atypical mole 05/13/2018   R distal lat thigh   Atypical mole 09/14/2014   left ant mid thigh   Atypical mole 01/19/2014   left paraspinal mid to upper back   Atypical mole 01/19/2014   right back mid to inf scapula   Atypical mole 01/19/2014   left posterior deltoid medial   Basal cell carcinoma 01/28/2020   left of midline mid dorsum nose   Basal cell carcinoma 01/06/2018   left preauricular    Basal cell carcinoma 11/22/2016   left upper lip   Basal cell carcinoma 03/17/2015   left lateral elbow   Basal cell carcinoma 07/22/2014   R medial inferior knee   Basal cell carcinoma 07/21/2020   Left dorsum hand. Nodular pattern. Excised 08/09/2020, margins free.   Basal cell carcinoma 02/23/2021   right dorsal hand (BCC/SCC), left dorsal hand, right chin - treated with EDC   Family history of breast cancer    History of breast cancer    History of pneumonia    Hypertension    Hypothyroidism    Macular degeneration    Squamous cell carcinoma of skin 01/06/2018   right dorsum hand at ring finger MCP   Squamous cell carcinoma of skin 07/08/2017   left distal dorsum forearm    Past Surgical History:  Procedure Laterality  Date   ABDOMINAL HYSTERECTOMY     ABDOMINAL HYSTERECTOMY     APPENDECTOMY     BREAST LUMPECTOMY     CATARACT EXTRACTION, BILATERAL     CHOLECYSTECTOMY     CORONARY STENT INTERVENTION N/A 07/26/2020   Procedure: CORONARY STENT INTERVENTION;  Surgeon: Isaias Cowman, MD;  Location: [facility];  Service: Cardiovascular;  Laterality: N/A;   LEFT HEART CATH AND CORONARY ANGIOGRAPHY Left 07/26/2020   Procedure: LEFT HEART CATH AND CORONARY ANGIOGRAPHY;  Surgeon: Corey Skains, MD;  Location: [facility];  Service: Cardiovascular;  Laterality: Left;   REPLACEMENT TOTAL KNEE     TONSILLECTOMY AND ADENOIDECTOMY     Family History  Problem Relation Age of Onset   CAD Mother    Cancer Maternal Aunt        unk type   Leukemia Maternal Grandmother    Breast cancer Half-Sister        dx 75s   Social History   Socioeconomic History   Marital status: Widowed    Spouse name: Not on file   Number of children: Not on file   Years of education: Not on file   Highest education level: Not on file  Occupational History   Not on file  Tobacco Use   Smoking status: Former    Packs/day: 1.50    Years: 6.00    Total pack years: 9.00    Types: Cigarettes    Quit date: 29    Years since quitting: 48.7   Smokeless tobacco: Never  Substance and Sexual Activity   Alcohol use: No    Alcohol/week: 0.0 standard drinks of alcohol   Drug use: No   Sexual activity: Not on file  Other Topics Concern   Not on file  Social History Narrative   Married    Social Determinants of Health   Financial Resource Strain: Low Risk  (10/10/2021)   Overall Financial Resource Strain (CARDIA)    Difficulty of Paying Living Expenses: Not hard at all  Food Insecurity: No Food Insecurity (10/10/2021)   Hunger Vital Sign    Worried About Running Out of Food in the Last Year: Never true    Ran Out of Food in the Last Year: Never true  Transportation Needs: No Transportation Needs  (10/10/2021)   PRAPARE - Hydrologist (Medical): No    Lack of Transportation (Non-Medical): No  Physical Activity: Unknown (08/22/2020)   Exercise Vital Sign    Days of Exercise per Week: 0 days    Minutes of Exercise per Session: Not on file  Stress: No Stress Concern Present (10/10/2021)   [place]    Feeling of Stress : Not at all  Social Connections: Moderately Integrated (10/10/2021)   Social Connection and Isolation Panel [NHANES]    Frequency of Communication with Friends and Family: More than three times a week    Frequency of Social Gatherings with Friends and Family: More than three times a week    Attends Religious Services: More than 4 times per year    Active Member of [employer] or Organizations: Yes    Attends Archivist Meetings: More than 4 times per year    Marital Status: Widowed     Review of Systems  Constitutional:  Positive for fatigue. Negative for fever.  HENT:  Negative for sinus pressure and sore throat.   Respiratory:  Positive for cough. Negative for chest tightness.        Breathing stable.    Cardiovascular:  Negative for chest pain, palpitations and leg swelling.  Gastrointestinal:  Negative for abdominal pain, diarrhea, nausea and vomiting.  Genitourinary:  Negative for difficulty urinating and dysuria.  Musculoskeletal:  Negative for joint swelling and myalgias.  Neurological:  Negative for dizziness and headaches.  Psychiatric/Behavioral:  Negative for agitation and dysphoric mood.        Objective:     BP 116/62 (BP Location: Left Arm, Patient Position: Sitting, Cuff Size: Small)   Pulse 82   Temp 98 F (36.7 C) (Oral)   Ht '5\' 6"'$  (1.676 m)   Wt 110 lb 12.8 oz (50.3 kg)   SpO2 92%   BMI 17.88 kg/m  Wt Readings from Last 3 Encounters:  06/14/22 110 lb 12.8 oz (50.3 kg)  05/31/22 111 lb 6.4 oz (50.5 kg)  04/13/22 113 lb (51.3 kg)     Physical Exam Vitals reviewed.  Constitutional:      General: She is not in acute distress.    Appearance: Normal appearance.  HENT:     Head: Normocephalic and atraumatic.     Right Ear: External ear normal.  Left Ear: External ear normal.  Eyes:     General: No scleral icterus.       Right eye: No discharge.        Left eye: No discharge.     Conjunctiva/sclera: Conjunctivae normal.  Neck:     Thyroid: No thyromegaly.  Cardiovascular:     Rate and Rhythm: Normal rate and regular rhythm.  Pulmonary:     Effort: No respiratory distress.     Breath sounds: No wheezing.     Comments: Increased cough with expiration.  Abdominal:     General: Bowel sounds are normal.     Palpations: Abdomen is soft.     Tenderness: There is no abdominal tenderness.  Musculoskeletal:        General: No swelling or tenderness.     Cervical back: Neck supple. No tenderness.  Lymphadenopathy:     Cervical: No cervical adenopathy.  Skin:    Findings: No erythema or rash.  Neurological:     Mental Status: She is alert.  Psychiatric:        Mood and Affect: Mood normal.        Behavior: Behavior normal.      Outpatient Encounter Medications as of 06/14/2022  Medication Sig   albuterol (VENTOLIN HFA) 108 (90 Base) MCG/ACT inhaler Inhale 2 puffs into the lungs every 6 (six) hours as needed for wheezing or shortness of breath.   amLODipine (NORVASC) 10 MG tablet TAKE 1 TABLET(10 MG) BY MOUTH DAILY   aspirin EC 81 MG EC tablet Take 1 tablet (81 mg total) by mouth daily. Swallow whole.   atorvastatin (LIPITOR) 40 MG tablet TAKE 1 TABLET(40 MG) BY MOUTH DAILY   [EXPIRED] azithromycin (ZITHROMAX) 250 MG tablet Take 2 tablets on day 1, then 1 tablet daily on days 2 through 5   calcium citrate-vitamin D (CITRACAL+D) 315-200 MG-UNIT tablet Take 1 tablet by mouth 2 (two) times daily.   clopidogrel (PLAVIX) 75 MG tablet Take 1 tablet (75 mg total) by mouth daily.   ibandronate (BONIVA) 150 MG  tablet Take 1 tablet (150 mg total) by mouth every 30 (thirty) days. Take in the morning with a full glass of water, on an empty stomach, and do not take anything else by mouth or lie down for the next 30 min.   irbesartan (AVAPRO) 150 MG tablet Take 150 mg by mouth daily.   irbesartan (AVAPRO) 75 MG tablet Take 1 tablet (75 mg total) by mouth daily.   levothyroxine (SYNTHROID) 137 MCG tablet Take 1 tablet (137 mcg total) by mouth daily before breakfast.   metoprolol succinate (TOPROL-XL) 50 MG 24 hr tablet TAKE 1 TABLET(50 MG) BY MOUTH EVERY DAY WITH OR IMMEDIATELY FOLLOWING A MEAL   Multiple Vitamin (MULTIVITAMIN) capsule Take 1 capsule by mouth daily.   Multiple Vitamins-Minerals (PRESERVISION AREDS 2 PO) Take 1 tablet by mouth 2 (two) times daily.    predniSONE (DELTASONE) 10 MG tablet Take 4 tablets x 1 day and then decrease by 1/2 tablet per day until down to zero mg.   Vibegron (GEMTESA) 75 MG TABS Take 75 mg by mouth daily.   No facility-administered encounter medications on file as of 06/14/2022.     Lab Results  Component Value Date   WBC 5.8 02/15/2022   HGB 13.7 02/15/2022   HCT 41.9 02/15/2022   PLT 163 02/15/2022   GLUCOSE 88 05/28/2022   CHOL 138 05/28/2022   TRIG 61.0 05/28/2022   HDL 65.80 05/28/2022  LDLCALC 60 05/28/2022   ALT 39 (H) 06/21/2022   AST 45 (H) 06/21/2022   NA 144 05/28/2022   K 4.0 05/28/2022   CL 100 05/28/2022   CREATININE 0.83 05/28/2022   BUN 18 05/28/2022   CO2 36 (H) 05/28/2022   TSH 4.96 05/28/2022   INR 1.1 02/13/2022   HGBA1C 5.6 02/13/2022    ECHOCARDIOGRAM COMPLETE  Result Date: 02/14/2022    ECHOCARDIOGRAM REPORT   Patient Name:   GENIENE LIST Date of Exam: 02/14/2022 Medical Rec #:  [id number]    Height:       66.0 in Accession #:    [id number]   Weight:       112.8 lb Date of Birth:  04/11/1930    BSA:          1.568 m Patient Age:    89 years     BP:           146/71 mmHg Patient Gender: F            HR:           58 bpm. Exam  Location:  [facility] Procedure: 2D Echo, Cardiac Doppler and Color Doppler Indications:     NSTEMI I21.4  History:         Patient has no prior history of Echocardiogram examinations.                  Risk Factors:Hypertension.  Sonographer:     Sherrie Sport Referring Phys:  [id number] Clinton TANG Diagnosing Phys: Serafina Royals MD IMPRESSIONS  1. Left ventricular ejection fraction, by estimation, is 55 to 60%. The left ventricle has normal function. The left ventricle has no regional wall motion abnormalities. There is mild left ventricular hypertrophy. Left ventricular diastolic parameters were normal.  2. Right ventricular systolic function is normal. The right ventricular size is normal.  3. The mitral valve is normal in structure. Mild mitral valve regurgitation.  4. The aortic valve is normal in structure. Aortic valve regurgitation is mild. FINDINGS  Left Ventricle: Left ventricular ejection fraction, by estimation, is 55 to 60%. The left ventricle has normal function. The left ventricle has no regional wall motion abnormalities. The left ventricular internal cavity size was normal in size. There is  mild left ventricular hypertrophy. Left ventricular diastolic parameters were normal. Right Ventricle: The right ventricular size is normal. No increase in right ventricular wall thickness. Right ventricular systolic function is normal. Left Atrium: Left atrial size was normal in size. Right Atrium: Right atrial size was normal in size. Pericardium: There is no evidence of pericardial effusion. Mitral Valve: The mitral valve is normal in structure. Mild mitral valve regurgitation. MV peak gradient, 3.7 mmHg. The mean mitral valve gradient is 1.0 mmHg. Tricuspid Valve: The tricuspid valve is normal in structure. Tricuspid valve regurgitation is mild. Aortic Valve: The aortic valve is normal in structure. Aortic valve regurgitation is mild. Aortic regurgitation PHT measures 547 msec. Aortic valve mean gradient  measures 4.5 mmHg. Aortic valve peak gradient measures 8.4 mmHg. Aortic valve area, by VTI measures 2.13 cm. Pulmonic Valve: The pulmonic valve was normal in structure. Pulmonic valve regurgitation is trivial. Aorta: The aortic root and ascending aorta are structurally normal, with no evidence of dilitation. IAS/Shunts: No atrial level shunt detected by color flow Doppler.  LEFT VENTRICLE PLAX 2D LVIDd:         4.60 cm   Diastology LVIDs:         2.90  cm   LV e' medial:    4.57 cm/s LV PW:         1.20 cm   LV E/e' medial:  16.4 LV IVS:        0.90 cm   LV e' lateral:   5.98 cm/s LVOT diam:     2.00 cm   LV E/e' lateral: 12.5 LV SV:         64 LV SV Index:   41 LVOT Area:     3.14 cm  RIGHT VENTRICLE RV Basal diam:  3.20 cm RV S prime:     11.00 cm/s TAPSE (M-mode): 2.6 cm LEFT ATRIUM             Index        RIGHT ATRIUM           Index LA diam:        2.60 cm 1.66 cm/m   RA Area:     13.60 cm LA Vol (A2C):   23.8 ml 15.18 ml/m  RA Volume:   40.20 ml  25.64 ml/m LA Vol (A4C):   33.4 ml 21.30 ml/m LA Biplane Vol: 30.7 ml 19.58 ml/m  AORTIC VALVE                    PULMONIC VALVE AV Area (Vmax):    1.84 cm     PV Vmax:        0.77 m/s AV Area (Vmean):   1.77 cm     PV Vmean:       57.900 cm/s AV Area (VTI):     2.13 cm     PV VTI:         0.163 m AV Vmax:           145.00 cm/s  PV Peak grad:   2.4 mmHg AV Vmean:          97.150 cm/s  PV Mean grad:   2.0 mmHg AV VTI:            0.300 m      RVOT Peak grad: 4 mmHg AV Peak Grad:      8.4 mmHg AV Mean Grad:      4.5 mmHg LVOT Vmax:         84.90 cm/s LVOT Vmean:        54.600 cm/s LVOT VTI:          0.204 m LVOT/AV VTI ratio: 0.68 AI PHT:            547 msec  AORTA Ao Root diam: 2.90 cm MITRAL VALVE               TRICUSPID VALVE MV Area (PHT): 3.68 cm    TR Peak grad:   22.3 mmHg MV Area VTI:   2.34 cm    TR Vmax:        236.00 cm/s MV Peak grad:  3.7 mmHg MV Mean grad:  1.0 mmHg    SHUNTS MV Vmax:       0.96 m/s    Systemic VTI:  0.20 m MV Vmean:      54.3  cm/s   Systemic Diam: 2.00 cm MV Decel Time: 206 msec    Pulmonic VTI:  0.149 m MV E velocity: 75.00 cm/s MV A velocity: 87.40 cm/s MV E/A ratio:  0.86 Serafina Royals MD Electronically signed by Serafina Royals MD Signature Date/Time: 02/14/2022/9:12:34 AM    Final  Assessment & Plan:   Problem List Items Addressed This Visit     Cough    Persistent cough as outlined. Just completed abx.  cxr per report ok.  Hold on repeat cxr.  Treat with prednisone taper as directed.  Albuterol inhaler as directed.  Follow.  Call with update.        Hypertension    Continues avapro, amlodipine and metoprolol. (she is taking '150mg'$  + '75mg'$  of avapro - since discharge).  Will hold on making changes. Follow pressures. Follow metabolic panel.         Einar Pheasant, MD

## 2022-06-21 ENCOUNTER — Other Ambulatory Visit (INDEPENDENT_AMBULATORY_CARE_PROVIDER_SITE_OTHER): Payer: Medicare Other

## 2022-06-21 DIAGNOSIS — R7989 Other specified abnormal findings of blood chemistry: Secondary | ICD-10-CM

## 2022-06-21 LAB — HEPATIC FUNCTION PANEL
ALT: 39 U/L — ABNORMAL HIGH (ref 0–35)
AST: 45 U/L — ABNORMAL HIGH (ref 0–37)
Albumin: 3.9 g/dL (ref 3.5–5.2)
Alkaline Phosphatase: 76 U/L (ref 39–117)
Bilirubin, Direct: 0.3 mg/dL (ref 0.0–0.3)
Total Bilirubin: 1.3 mg/dL — ABNORMAL HIGH (ref 0.2–1.2)
Total Protein: 6.8 g/dL (ref 6.0–8.3)

## 2022-06-22 ENCOUNTER — Other Ambulatory Visit: Payer: Self-pay | Admitting: Internal Medicine

## 2022-06-22 DIAGNOSIS — R7989 Other specified abnormal findings of blood chemistry: Secondary | ICD-10-CM

## 2022-06-22 NOTE — Progress Notes (Signed)
Order placed for abdominal ultrasound.   

## 2022-06-24 ENCOUNTER — Encounter: Payer: Self-pay | Admitting: Internal Medicine

## 2022-06-24 DIAGNOSIS — R059 Cough, unspecified: Secondary | ICD-10-CM | POA: Insufficient documentation

## 2022-06-24 NOTE — Assessment & Plan Note (Signed)
Continues avapro, amlodipine and metoprolol. (she is taking '150mg'$  + '75mg'$  of avapro - since discharge).  Will hold on making changes. Follow pressures. Follow metabolic panel.

## 2022-06-24 NOTE — Assessment & Plan Note (Signed)
Persistent cough as outlined. Just completed abx.  cxr per report ok.  Hold on repeat cxr.  Treat with prednisone taper as directed.  Albuterol inhaler as directed.  Follow.  Call with update.

## 2022-06-29 ENCOUNTER — Ambulatory Visit
Admission: RE | Admit: 2022-06-29 | Discharge: 2022-06-29 | Disposition: A | Discharge status: Home / Self Care | Payer: Medicare Other | Source: Ambulatory Visit | Attending: Internal Medicine | Admitting: Internal Medicine

## 2022-06-29 DIAGNOSIS — R7989 Other specified abnormal findings of blood chemistry: Secondary | ICD-10-CM | POA: Diagnosis not present

## 2022-06-30 ENCOUNTER — Other Ambulatory Visit: Payer: Self-pay | Admitting: Internal Medicine

## 2022-07-02 ENCOUNTER — Other Ambulatory Visit: Payer: Self-pay | Admitting: Internal Medicine

## 2022-07-02 DIAGNOSIS — R7989 Other specified abnormal findings of blood chemistry: Secondary | ICD-10-CM

## 2022-07-02 NOTE — Progress Notes (Signed)
Order placed for GI referral.   

## 2022-07-07 ENCOUNTER — Other Ambulatory Visit: Payer: Self-pay | Admitting: Internal Medicine

## 2022-07-09 ENCOUNTER — Telehealth: Payer: Self-pay | Admitting: Urology

## 2022-07-09 MED ORDER — GEMTESA 75 MG PO TABS: 75.0000 mg | ORAL_TABLET | Freq: Every day | ORAL | 2 refills | Status: DC

## 2022-07-09 NOTE — Telephone Encounter (Signed)
Patient called the office today requesting a new prescription for Gemtesa to be sent to [facility] on [address].

## 2022-07-09 NOTE — Telephone Encounter (Signed)
Gemtesa refill sent to pharmacy.  Patient aware.

## 2022-08-16 ENCOUNTER — Ambulatory Visit: Payer: Medicare Other | Admitting: Dermatology

## 2022-09-13 ENCOUNTER — Telehealth: Payer: Self-pay | Admitting: Internal Medicine

## 2022-09-13 ENCOUNTER — Other Ambulatory Visit: Payer: Self-pay

## 2022-09-13 DIAGNOSIS — E039 Hypothyroidism, unspecified: Secondary | ICD-10-CM

## 2022-09-13 DIAGNOSIS — I10 Essential (primary) hypertension: Secondary | ICD-10-CM

## 2022-09-13 DIAGNOSIS — E7800 Pure hypercholesterolemia, unspecified: Secondary | ICD-10-CM

## 2022-09-13 DIAGNOSIS — R7989 Other specified abnormal findings of blood chemistry: Secondary | ICD-10-CM

## 2022-09-13 NOTE — Telephone Encounter (Signed)
ORDERED

## 2022-09-13 NOTE — Telephone Encounter (Signed)
Orders placed for labs

## 2022-09-13 NOTE — Addendum Note (Signed)
Addended by: Alisa Graff on: 09/13/2022 09:43 PM   Modules accepted: Orders

## 2022-09-13 NOTE — Telephone Encounter (Signed)
Patient has a lab appt 09/18/2022, there are no orders in.

## 2022-09-18 ENCOUNTER — Other Ambulatory Visit (INDEPENDENT_AMBULATORY_CARE_PROVIDER_SITE_OTHER): Payer: Medicare Other

## 2022-09-18 DIAGNOSIS — E039 Hypothyroidism, unspecified: Secondary | ICD-10-CM | POA: Diagnosis not present

## 2022-09-18 DIAGNOSIS — I10 Essential (primary) hypertension: Secondary | ICD-10-CM

## 2022-09-18 DIAGNOSIS — E7800 Pure hypercholesterolemia, unspecified: Secondary | ICD-10-CM

## 2022-09-18 LAB — LIPID PANEL
Cholesterol: 170 mg/dL (ref 0–200)
HDL: 73.5 mg/dL (ref >39.00)
LDL Cholesterol: 82 mg/dL (ref 0–99)
NonHDL: 96.44
Total CHOL/HDL Ratio: 2
Triglycerides: 70 mg/dL (ref 0.0–149.0)
VLDL: 14 mg/dL (ref 0.0–40.0)

## 2022-09-18 LAB — BASIC METABOLIC PANEL
BUN: 18 mg/dL (ref 6–23)
CO2: 39 mEq/L — ABNORMAL HIGH (ref 19–32)
Calcium: 9.2 mg/dL (ref 8.4–10.5)
Chloride: 95 mEq/L — ABNORMAL LOW (ref 96–112)
Creatinine, Ser: 0.89 mg/dL (ref 0.40–1.20)
GFR: 56.88 mL/min — ABNORMAL LOW (ref >60.00)
Glucose, Bld: 92 mg/dL (ref 70–99)
Potassium: 3.7 mEq/L (ref 3.5–5.1)
Sodium: 141 mEq/L (ref 135–145)

## 2022-09-18 LAB — HEPATIC FUNCTION PANEL
ALT: 12 U/L (ref 0–35)
AST: 26 U/L (ref 0–37)
Albumin: 3.8 g/dL (ref 3.5–5.2)
Alkaline Phosphatase: 70 U/L (ref 39–117)
Bilirubin, Direct: 0.3 mg/dL (ref 0.0–0.3)
Total Bilirubin: 1.4 mg/dL — ABNORMAL HIGH (ref 0.2–1.2)
Total Protein: 6.2 g/dL (ref 6.0–8.3)

## 2022-09-18 LAB — TSH: TSH: 7.06 u[IU]/mL — ABNORMAL HIGH (ref 0.35–5.50)

## 2022-09-21 ENCOUNTER — Ambulatory Visit (INDEPENDENT_AMBULATORY_CARE_PROVIDER_SITE_OTHER): Payer: Medicare Other | Admitting: Internal Medicine

## 2022-09-21 ENCOUNTER — Telehealth: Payer: Self-pay | Admitting: Internal Medicine

## 2022-09-21 ENCOUNTER — Encounter: Payer: Self-pay | Admitting: Internal Medicine

## 2022-09-21 VITALS — BP 124/70 | HR 76 | Temp 97.7°F | Resp 15 | Ht 66.0 in | Wt 119.4 lb

## 2022-09-21 DIAGNOSIS — I2510 Atherosclerotic heart disease of native coronary artery without angina pectoris: Secondary | ICD-10-CM

## 2022-09-21 DIAGNOSIS — E039 Hypothyroidism, unspecified: Secondary | ICD-10-CM

## 2022-09-21 DIAGNOSIS — I10 Essential (primary) hypertension: Secondary | ICD-10-CM | POA: Diagnosis not present

## 2022-09-21 DIAGNOSIS — I700 Atherosclerosis of aorta: Secondary | ICD-10-CM | POA: Diagnosis not present

## 2022-09-21 DIAGNOSIS — J479 Bronchiectasis, uncomplicated: Secondary | ICD-10-CM | POA: Diagnosis not present

## 2022-09-21 DIAGNOSIS — F439 Reaction to severe stress, unspecified: Secondary | ICD-10-CM

## 2022-09-21 DIAGNOSIS — N2889 Other specified disorders of kidney and ureter: Secondary | ICD-10-CM

## 2022-09-21 DIAGNOSIS — E7800 Pure hypercholesterolemia, unspecified: Secondary | ICD-10-CM

## 2022-09-21 LAB — BASIC METABOLIC PANEL
BUN: 25 mg/dL — ABNORMAL HIGH (ref 6–23)
CO2: 38 mEq/L — ABNORMAL HIGH (ref 19–32)
Calcium: 9.4 mg/dL (ref 8.4–10.5)
Chloride: 93 mEq/L — ABNORMAL LOW (ref 96–112)
Creatinine, Ser: 0.92 mg/dL (ref 0.40–1.20)
GFR: 54.66 mL/min — ABNORMAL LOW (ref >60.00)
Glucose, Bld: 80 mg/dL (ref 70–99)
Potassium: 3.7 mEq/L (ref 3.5–5.1)
Sodium: 140 mEq/L (ref 135–145)

## 2022-09-21 MED ORDER — CLOPIDOGREL BISULFATE 75 MG PO TABS: 75.0000 mg | ORAL_TABLET | Freq: Every day | ORAL | 1 refills | Status: DC

## 2022-09-21 MED ORDER — METOPROLOL SUCCINATE ER 50 MG PO TB24: ORAL_TABLET | ORAL | 2 refills | Status: DC

## 2022-09-21 MED ORDER — IRBESARTAN 150 MG PO TABS: 150.0000 mg | ORAL_TABLET | Freq: Two times a day (BID) | ORAL | 1 refills | Status: DC

## 2022-09-21 MED ORDER — ATORVASTATIN CALCIUM 40 MG PO TABS: ORAL_TABLET | ORAL | 1 refills | Status: DC

## 2022-09-21 NOTE — Telephone Encounter (Signed)
Pt advised Meds sent

## 2022-09-21 NOTE — Patient Instructions (Signed)
Call with thyroid dose currently taking.

## 2022-09-21 NOTE — Telephone Encounter (Signed)
Ok to send in 132mg q day.  Make sure she knows to stop 1351m and start 15087m  Needs f/u tsh in 6-8 weeks.

## 2022-09-21 NOTE — Telephone Encounter (Signed)
Patient is taking 137 mg of Levothyroxine.

## 2022-09-21 NOTE — Progress Notes (Signed)
[name], female   DOB: 1930/08/10, 88 y.o.   MRN: [id number]   Subjective:    [name], female    DOB: 04-May-1930, 88 y.o.   MRN: [id number]   Patient here for scheduled follow up.  Chief Complaint  Patient presents with   Medical Management of Chronic Issues   Hyperlipidemia   Hypertension   .   HPI Here to follow up regarding her blood pressure and cholesterol.  Was evaluated by Dr Sheppard Coil (cardiology)_ 08/01/22 - recommended increase avapro to '150mg'$  bid and start hctz.  States blood pressures averaging 120s/70s.  Feeling well.  Going to the gym.  Working with a Clinical research associate.  Breathing stable.  Eating.  No vomiting reported.  No abdominal pain or bowel change reported.     Past Medical History:  Diagnosis Date   Actinic keratosis 01/28/2020   R thumb proximal nail fold    Atypical mole 05/13/2018   R distal lat thigh   Atypical mole 09/14/2014   left ant mid thigh   Atypical mole 01/19/2014   left paraspinal mid to upper back   Atypical mole 01/19/2014   right back mid to inf scapula   Atypical mole 01/19/2014   left posterior deltoid medial   Basal cell carcinoma 01/28/2020   left of midline mid dorsum nose   Basal cell carcinoma 01/06/2018   left preauricular    Basal cell carcinoma 11/22/2016   left upper lip   Basal cell carcinoma 03/17/2015   left lateral elbow   Basal cell carcinoma 07/22/2014   R medial inferior knee   Basal cell carcinoma 07/21/2020   Left dorsum hand. Nodular pattern. Excised 08/09/2020, margins free.   Basal cell carcinoma 02/23/2021   right dorsal hand (BCC/SCC), left dorsal hand, right chin - treated with EDC   Family history of breast cancer    History of breast cancer    History of pneumonia    Hypertension    Hypothyroidism    Macular degeneration    Squamous cell carcinoma of skin 01/06/2018   right dorsum hand at ring finger MCP   Squamous cell carcinoma of skin 07/08/2017   left distal dorsum  forearm    Past Surgical History:  Procedure Laterality Date   ABDOMINAL HYSTERECTOMY     ABDOMINAL HYSTERECTOMY     APPENDECTOMY     BREAST LUMPECTOMY     CATARACT EXTRACTION, BILATERAL     CHOLECYSTECTOMY     CORONARY STENT INTERVENTION N/A 07/26/2020   Procedure: CORONARY STENT INTERVENTION;  Surgeon: Isaias Cowman, MD;  Location: [facility];  Service: Cardiovascular;  Laterality: N/A;   LEFT HEART CATH AND CORONARY ANGIOGRAPHY Left 07/26/2020   Procedure: LEFT HEART CATH AND CORONARY ANGIOGRAPHY;  Surgeon: Corey Skains, MD;  Location: [facility];  Service: Cardiovascular;  Laterality: Left;   REPLACEMENT TOTAL KNEE     TONSILLECTOMY AND ADENOIDECTOMY     Family History  Problem Relation Age of Onset   CAD Mother    Cancer Maternal Aunt        unk type   Leukemia Maternal Grandmother    Breast cancer Half-Sister        dx 83s   Social History   Socioeconomic History   Marital status: Widowed    Spouse name: Not on file   Number of children: Not on file   Years of education: Not on file   Highest education level: Not on file  Occupational History   Not on file  Tobacco Use   Smoking status: Former    Packs/day: 1.50    Years: 6.00    Total pack years: 9.00    Types: Cigarettes    Quit date: 38    Years since quitting: 49.0   Smokeless tobacco: Never  Substance and Sexual Activity   Alcohol use: No    Alcohol/week: 0.0 standard drinks of alcohol   Drug use: No   Sexual activity: Not on file  Other Topics Concern   Not on file  Social History Narrative   Married    Social Determinants of Health   Financial Resource Strain: Low Risk  (10/10/2021)   Overall Financial Resource Strain (CARDIA)    Difficulty of Paying Living Expenses: Not hard at all  Food Insecurity: No Food Insecurity (10/10/2021)   Hunger Vital Sign    Worried About Running Out of Food in the Last Year: Never true    Ran Out of Food in the Last Year: Never  true  Transportation Needs: No Transportation Needs (10/10/2021)   PRAPARE - Hydrologist (Medical): No    Lack of Transportation (Non-Medical): No  Physical Activity: Unknown (08/22/2020)   Exercise Vital Sign    Days of Exercise per Week: 0 days    Minutes of Exercise per Session: Not on file  Stress: No Stress Concern Present (10/10/2021)   [place]    Feeling of Stress : Not at all  Social Connections: Moderately Integrated (10/10/2021)   Social Connection and Isolation Panel [NHANES]    Frequency of Communication with Friends and Family: More than three times a week    Frequency of Social Gatherings with Friends and Family: More than three times a week    Attends Religious Services: More than 4 times per year    Active Member of [employer] or Organizations: Yes    Attends Archivist Meetings: More than 4 times per year    Marital Status: Widowed     Review of Systems  Constitutional:  Negative for appetite change and fever.  HENT:  Negative for congestion, sinus pressure and sore throat.   Respiratory:  Negative for cough and chest tightness.        Breathing stable.    Cardiovascular:  Negative for chest pain, palpitations and leg swelling.  Gastrointestinal:  Negative for abdominal pain, diarrhea, nausea and vomiting.  Genitourinary:  Negative for difficulty urinating and dysuria.  Musculoskeletal:  Negative for joint swelling and myalgias.  Skin:  Negative for color change and rash.  Neurological:  Negative for dizziness and headaches.  Psychiatric/Behavioral:  Negative for agitation and dysphoric mood.        Objective:     BP 124/70 (BP Location: Left Arm, Patient Position: Sitting, Cuff Size: Small)   Pulse 76   Temp 97.7 F (36.5 C) (Temporal)   Resp 15   Ht '5\' 6"'$  (1.676 m)   Wt 119 lb 6.4 oz (54.2 kg)   SpO2 95%   BMI 19.27 kg/m  Wt Readings from Last 3  Encounters:  09/21/22 119 lb 6.4 oz (54.2 kg)  06/14/22 110 lb 12.8 oz (50.3 kg)  05/31/22 111 lb 6.4 oz (50.5 kg)    Physical Exam Vitals reviewed.  Constitutional:      General: She is not in acute distress.    Appearance: Normal appearance.  HENT:     Head: Normocephalic  and atraumatic.     Right Ear: External ear normal.     Left Ear: External ear normal.  Eyes:     General: No scleral icterus.       Right eye: No discharge.        Left eye: No discharge.     Conjunctiva/sclera: Conjunctivae normal.  Neck:     Thyroid: No thyromegaly.  Cardiovascular:     Rate and Rhythm: Normal rate and regular rhythm.  Pulmonary:     Effort: No respiratory distress.     Breath sounds: No wheezing.     Comments: Increased cough with expiration.  Abdominal:     General: Bowel sounds are normal.     Palpations: Abdomen is soft.     Tenderness: There is no abdominal tenderness.  Musculoskeletal:        General: No swelling or tenderness.     Cervical back: Neck supple. No tenderness.  Lymphadenopathy:     Cervical: No cervical adenopathy.  Skin:    Findings: No erythema or rash.  Neurological:     Mental Status: She is alert.  Psychiatric:        Mood and Affect: Mood normal.        Behavior: Behavior normal.      Outpatient Encounter Medications as of 09/21/2022  Medication Sig   albuterol (VENTOLIN HFA) 108 (90 Base) MCG/ACT inhaler Inhale 2 puffs into the lungs every 6 (six) hours as needed for wheezing or shortness of breath.   amLODipine (NORVASC) 10 MG tablet TAKE 1 TABLET(10 MG) BY MOUTH DAILY   aspirin EC 81 MG EC tablet Take 1 tablet (81 mg total) by mouth daily. Swallow whole.   calcium citrate-vitamin D (CITRACAL+D) 315-200 MG-UNIT tablet Take 1 tablet by mouth 2 (two) times daily.   ibandronate (BONIVA) 150 MG tablet Take 1 tablet (150 mg total) by mouth every 30 (thirty) days. Take in the morning with a full glass of water, on an empty stomach, and do not take  anything else by mouth or lie down for the next 30 min.   Multiple Vitamin (MULTIVITAMIN) capsule Take 1 capsule by mouth daily.   Multiple Vitamins-Minerals (PRESERVISION AREDS 2 PO) Take 1 tablet by mouth 2 (two) times daily.    predniSONE (DELTASONE) 10 MG tablet Take 4 tablets x 1 day and then decrease by 1/2 tablet per day until down to zero mg.   Vibegron (GEMTESA) 75 MG TABS Take 75 mg by mouth daily.   [DISCONTINUED] atorvastatin (LIPITOR) 40 MG tablet TAKE 1 TABLET(40 MG) BY MOUTH DAILY   [DISCONTINUED] clopidogrel (PLAVIX) 75 MG tablet Take 1 tablet (75 mg total) by mouth daily.   [DISCONTINUED] irbesartan (AVAPRO) 150 MG tablet Take 150 mg by mouth daily.   [DISCONTINUED] irbesartan (AVAPRO) 75 MG tablet Take 1 tablet (75 mg total) by mouth daily.   [DISCONTINUED] metoprolol succinate (TOPROL-XL) 50 MG 24 hr tablet TAKE 1 TABLET(50 MG) BY MOUTH EVERY DAY WITH OR IMMEDIATELY FOLLOWING A MEAL   atorvastatin (LIPITOR) 40 MG tablet TAKE 1 TABLET(40 MG) BY MOUTH DAILY   clopidogrel (PLAVIX) 75 MG tablet Take 1 tablet (75 mg total) by mouth daily.   irbesartan (AVAPRO) 150 MG tablet Take 1 tablet (150 mg total) by mouth 2 (two) times daily.   metoprolol succinate (TOPROL-XL) 50 MG 24 hr tablet TAKE 1 TABLET(50 MG) BY MOUTH EVERY DAY WITH OR IMMEDIATELY FOLLOWING A MEAL   [DISCONTINUED] levothyroxine (SYNTHROID) 137 MCG tablet TAKE 1 TABLET(137  MCG) BY MOUTH DAILY BEFORE BREAKFAST   No facility-administered encounter medications on file as of 09/21/2022.     Lab Results  Component Value Date   WBC 5.8 02/15/2022   HGB 13.7 02/15/2022   HCT 41.9 02/15/2022   PLT 163 02/15/2022   GLUCOSE 80 09/21/2022   CHOL 170 09/18/2022   TRIG 70.0 09/18/2022   HDL 73.50 09/18/2022   LDLCALC 82 09/18/2022   ALT 12 09/18/2022   AST 26 09/18/2022   NA 140 09/21/2022   K 3.7 09/21/2022   CL 93 (L) 09/21/2022   CREATININE 0.92 09/21/2022   BUN 25 (H) 09/21/2022   CO2 38 (H) 09/21/2022   TSH  7.06 (H) 09/18/2022   INR 1.1 02/13/2022   HGBA1C 5.6 02/13/2022    ECHOCARDIOGRAM COMPLETE  Result Date: 02/14/2022    ECHOCARDIOGRAM REPORT   Patient Name:   LAYANI FORONDA Date of Exam: 02/14/2022 Medical Rec #:  [id number]    Height:       66.0 in Accession #:    [id number]   Weight:       112.8 lb Date of Birth:  12-14-1929    BSA:          1.568 m Patient Age:    9 years     BP:           146/71 mmHg Patient Gender: F            HR:           58 bpm. Exam Location:  [facility] Procedure: 2D Echo, Cardiac Doppler and Color Doppler Indications:     NSTEMI I21.4  History:         Patient has no prior history of Echocardiogram examinations.                  Risk Factors:Hypertension.  Sonographer:     Sherrie Sport Referring Phys:  [id number] Durand TANG Diagnosing Phys: Serafina Royals MD IMPRESSIONS  1. Left ventricular ejection fraction, by estimation, is 55 to 60%. The left ventricle has normal function. The left ventricle has no regional wall motion abnormalities. There is mild left ventricular hypertrophy. Left ventricular diastolic parameters were normal.  2. Right ventricular systolic function is normal. The right ventricular size is normal.  3. The mitral valve is normal in structure. Mild mitral valve regurgitation.  4. The aortic valve is normal in structure. Aortic valve regurgitation is mild. FINDINGS  Left Ventricle: Left ventricular ejection fraction, by estimation, is 55 to 60%. The left ventricle has normal function. The left ventricle has no regional wall motion abnormalities. The left ventricular internal cavity size was normal in size. There is  mild left ventricular hypertrophy. Left ventricular diastolic parameters were normal. Right Ventricle: The right ventricular size is normal. No increase in right ventricular wall thickness. Right ventricular systolic function is normal. Left Atrium: Left atrial size was normal in size. Right Atrium: Right atrial size was normal in size. Pericardium: There  is no evidence of pericardial effusion. Mitral Valve: The mitral valve is normal in structure. Mild mitral valve regurgitation. MV peak gradient, 3.7 mmHg. The mean mitral valve gradient is 1.0 mmHg. Tricuspid Valve: The tricuspid valve is normal in structure. Tricuspid valve regurgitation is mild. Aortic Valve: The aortic valve is normal in structure. Aortic valve regurgitation is mild. Aortic regurgitation PHT measures 547 msec. Aortic valve mean gradient measures 4.5 mmHg. Aortic valve peak gradient measures 8.4 mmHg. Aortic valve area, by VTI measures  2.13 cm. Pulmonic Valve: The pulmonic valve was normal in structure. Pulmonic valve regurgitation is trivial. Aorta: The aortic root and ascending aorta are structurally normal, with no evidence of dilitation. IAS/Shunts: No atrial level shunt detected by color flow Doppler.  LEFT VENTRICLE PLAX 2D LVIDd:         4.60 cm   Diastology LVIDs:         2.90 cm   LV e' medial:    4.57 cm/s LV PW:         1.20 cm   LV E/e' medial:  16.4 LV IVS:        0.90 cm   LV e' lateral:   5.98 cm/s LVOT diam:     2.00 cm   LV E/e' lateral: 12.5 LV SV:         64 LV SV Index:   41 LVOT Area:     3.14 cm  RIGHT VENTRICLE RV Basal diam:  3.20 cm RV S prime:     11.00 cm/s TAPSE (M-mode): 2.6 cm LEFT ATRIUM             Index        RIGHT ATRIUM           Index LA diam:        2.60 cm 1.66 cm/m   RA Area:     13.60 cm LA Vol (A2C):   23.8 ml 15.18 ml/m  RA Volume:   40.20 ml  25.64 ml/m LA Vol (A4C):   33.4 ml 21.30 ml/m LA Biplane Vol: 30.7 ml 19.58 ml/m  AORTIC VALVE                    PULMONIC VALVE AV Area (Vmax):    1.84 cm     PV Vmax:        0.77 m/s AV Area (Vmean):   1.77 cm     PV Vmean:       57.900 cm/s AV Area (VTI):     2.13 cm     PV VTI:         0.163 m AV Vmax:           145.00 cm/s  PV Peak grad:   2.4 mmHg AV Vmean:          97.150 cm/s  PV Mean grad:   2.0 mmHg AV VTI:            0.300 m      RVOT Peak grad: 4 mmHg AV Peak Grad:      8.4 mmHg AV Mean Grad:       4.5 mmHg LVOT Vmax:         84.90 cm/s LVOT Vmean:        54.600 cm/s LVOT VTI:          0.204 m LVOT/AV VTI ratio: 0.68 AI PHT:            547 msec  AORTA Ao Root diam: 2.90 cm MITRAL VALVE               TRICUSPID VALVE MV Area (PHT): 3.68 cm    TR Peak grad:   22.3 mmHg MV Area VTI:   2.34 cm    TR Vmax:        236.00 cm/s MV Peak grad:  3.7 mmHg MV Mean grad:  1.0 mmHg    SHUNTS MV Vmax:       0.96 m/s  Systemic VTI:  0.20 m MV Vmean:      54.3 cm/s   Systemic Diam: 2.00 cm MV Decel Time: 206 msec    Pulmonic VTI:  0.149 m MV E velocity: 75.00 cm/s MV A velocity: 87.40 cm/s MV E/A ratio:  0.86 Serafina Royals MD Electronically signed by Serafina Royals MD Signature Date/Time: 02/14/2022/9:12:34 AM    Final        Assessment & Plan:   Problem List Items Addressed This Visit     Aortic atherosclerosis ([place])    Continue lipitor.       Relevant Medications   atorvastatin (LIPITOR) 40 MG tablet   metoprolol succinate (TOPROL-XL) 50 MG 24 hr tablet   irbesartan (AVAPRO) 150 MG tablet   Bronchiectasis without complication (HCC)    No increased sob.  Breathing stable.       CAD (coronary artery disease)    Recent admission for NSTEMI.  ECHO with no wall motion abnormality.  EF 60%.  Recommended dual antiplatelet therapy and statin medication.  No chest pain.  Follow. Continue risk factor modification.  Seeing Dr Sheppard Coil.  Avapro increased to '150mg'$  bid.  Also per note - hctz added.  Follow pressures.       Relevant Medications   atorvastatin (LIPITOR) 40 MG tablet   metoprolol succinate (TOPROL-XL) 50 MG 24 hr tablet   irbesartan (AVAPRO) 150 MG tablet   Hypercholesterolemia    On lipitor. Follow lipid panel and liver function tests.       Relevant Medications   atorvastatin (LIPITOR) 40 MG tablet   metoprolol succinate (TOPROL-XL) 50 MG 24 hr tablet   irbesartan (AVAPRO) 150 MG tablet   Hypertension - Primary    Continues avapro, amlodipine and metoprolol. Recently saw  cardiology.  Avapro increased to '150mg'$  bid.  Also, per note - started on hctz 12.'5mg'$  q day. Follow pressures. Follow metabolic panel.       Relevant Medications   atorvastatin (LIPITOR) 40 MG tablet   metoprolol succinate (TOPROL-XL) 50 MG 24 hr tablet   irbesartan (AVAPRO) 150 MG tablet   Other Relevant Orders   Basic metabolic panel (Completed)   Hypothyroidism    On thyroid replacement.  Follow tsh.       Relevant Medications   metoprolol succinate (TOPROL-XL) 50 MG 24 hr tablet   Kidney mass    Saw Dr Erlene Quan 01/2022.   CT scan is unchanged from previous scan, 6 months ago which is reassuring with no more likely indolent malignancy. Continue to recommend active surveillance Plan for repeat scan in 9 months       Stress    Overall appears to be handling stress relatively well.         Einar Pheasant, MD

## 2022-09-24 ENCOUNTER — Other Ambulatory Visit: Payer: Self-pay | Admitting: *Deleted

## 2022-09-24 DIAGNOSIS — N289 Disorder of kidney and ureter, unspecified: Secondary | ICD-10-CM

## 2022-09-24 MED ORDER — LEVOTHYROXINE SODIUM 150 MCG PO TABS: 150.0000 ug | ORAL_TABLET | Freq: Every day | ORAL | 3 refills | Status: DC

## 2022-09-24 NOTE — Telephone Encounter (Signed)
Prescription Request  09/24/2022  Is this a "Controlled Substance" medicine? No  LOV: 09/21/2022  What is the name of the medication or equipment? levothyroxine  150 MCG  Have you contacted your pharmacy to request a refill? No   Which pharmacy would you like this sent to?  [id number] - [place], [place] - [address] [place] [address] Phone: [phone] Fax: [phone]    Patient notified that their request is being sent to the clinical staff for review and that they should receive a response within 2 business days.   Please advise at Mobile [phone] (mobile)

## 2022-09-24 NOTE — Telephone Encounter (Signed)
Rx was not sent in on 09/21/22. I spoke with patient & notified her that I sent in the 150 mcg to [facility] today.

## 2022-10-02 ENCOUNTER — Encounter: Payer: Self-pay | Admitting: Internal Medicine

## 2022-10-02 ENCOUNTER — Telehealth: Payer: Self-pay

## 2022-10-02 ENCOUNTER — Telehealth: Payer: Self-pay | Admitting: Internal Medicine

## 2022-10-02 NOTE — Assessment & Plan Note (Signed)
Continue lipitor  ?

## 2022-10-02 NOTE — Telephone Encounter (Signed)
Agree with need for evaluation given history.

## 2022-10-02 NOTE — Assessment & Plan Note (Signed)
Recent admission for NSTEMI.  ECHO with no wall motion abnormality.  EF 60%.  Recommended dual antiplatelet therapy and statin medication.  No chest pain.  Follow. Continue risk factor modification.  Seeing Dr Sheppard Coil.  Avapro increased to '150mg'$  bid.  Also per note - hctz added.  Follow pressures.

## 2022-10-02 NOTE — Assessment & Plan Note (Signed)
Overall appears to be handling stress relatively well.

## 2022-10-02 NOTE — Assessment & Plan Note (Signed)
Saw Dr Erlene Quan 01/2022.   CT scan is unchanged from previous scan, 6 months ago which is reassuring with no more likely indolent malignancy. Continue to recommend active surveillance Plan for repeat scan in 9 months

## 2022-10-02 NOTE — Assessment & Plan Note (Signed)
No increased sob.  Breathing stable.

## 2022-10-02 NOTE — Assessment & Plan Note (Signed)
On lipitor.  Follow lipid panel and liver function tests.   

## 2022-10-02 NOTE — Assessment & Plan Note (Signed)
Continues avapro, amlodipine and metoprolol. Recently saw cardiology.  Avapro increased to '150mg'$  bid.  Also, per note - started on hctz 12.'5mg'$  q day. Follow pressures. Follow metabolic panel.

## 2022-10-02 NOTE — Telephone Encounter (Signed)
S/w pt - stated BP is not high - this morning was 128/68 But pt is weak and having shortness of breathe - stated worse than it has been in a while. Pt was advised to go to the ED - pt adamantly refused. Stated she is not sick, she is not going.  Pt advised we are concerned due to her history of heart disease. She needs to be evaluated to make sure nothing acute going on.   Pt was agreeable to go to urgent care to be evaluated.

## 2022-10-02 NOTE — Assessment & Plan Note (Signed)
On thyroid replacement.  Follow tsh.  

## 2022-10-02 NOTE — Telephone Encounter (Signed)
The patient's sister called stating the patient has been having elevated BP, weakness and SOB. Sent patient to Access Nurse. Scheduled visit for 10/04/22 with Wynetta Emery.

## 2022-10-04 ENCOUNTER — Ambulatory Visit (INDEPENDENT_AMBULATORY_CARE_PROVIDER_SITE_OTHER): Payer: Medicare Other | Admitting: Nurse Practitioner

## 2022-10-04 ENCOUNTER — Other Ambulatory Visit
Admission: RE | Admit: 2022-10-04 | Discharge: 2022-10-04 | Disposition: A | Discharge status: Home / Self Care | Payer: Medicare Other | Source: Ambulatory Visit | Attending: Nurse Practitioner | Admitting: Nurse Practitioner

## 2022-10-04 ENCOUNTER — Encounter: Payer: Self-pay | Admitting: Nurse Practitioner

## 2022-10-04 VITALS — BP 136/74 | HR 66 | Temp 98.2°F | Ht 66.0 in | Wt 119.2 lb

## 2022-10-04 DIAGNOSIS — R5383 Other fatigue: Secondary | ICD-10-CM | POA: Diagnosis present

## 2022-10-04 DIAGNOSIS — R0602 Shortness of breath: Secondary | ICD-10-CM | POA: Insufficient documentation

## 2022-10-04 DIAGNOSIS — I10 Essential (primary) hypertension: Secondary | ICD-10-CM | POA: Diagnosis present

## 2022-10-04 LAB — CBC WITH DIFFERENTIAL/PLATELET
Abs Immature Granulocytes: 0.02 10*3/uL (ref 0.00–0.07)
Basophils Absolute: 0 10*3/uL (ref 0.0–0.1)
Basophils Relative: 1 %
Eosinophils Absolute: 0.1 10*3/uL (ref 0.0–0.5)
Eosinophils Relative: 1 %
HCT: 41.7 % (ref 36.0–46.0)
Hemoglobin: 14 g/dL (ref 12.0–15.0)
Immature Granulocytes: 0 %
Lymphocytes Relative: 20 %
Lymphs Abs: 1.5 10*3/uL (ref 0.7–4.0)
MCH: 31.7 pg (ref 26.0–34.0)
MCHC: 33.6 g/dL (ref 30.0–36.0)
MCV: 94.3 fL (ref 80.0–100.0)
Monocytes Absolute: 0.9 10*3/uL (ref 0.1–1.0)
Monocytes Relative: 12 %
Neutro Abs: 5 10*3/uL (ref 1.7–7.7)
Neutrophils Relative %: 66 %
Platelets: 365 10*3/uL (ref 150–400)
RBC: 4.42 MIL/uL (ref 3.87–5.11)
RDW: 13.2 % (ref 11.5–15.5)
WBC: 7.6 10*3/uL (ref 4.0–10.5)
nRBC: 0 % (ref 0.0–0.2)

## 2022-10-04 LAB — BASIC METABOLIC PANEL
Anion gap: 9 (ref 5–15)
BUN: 25 mg/dL — ABNORMAL HIGH (ref 8–23)
CO2: 33 mmol/L — ABNORMAL HIGH (ref 22–32)
Calcium: 9.1 mg/dL (ref 8.9–10.3)
Chloride: 93 mmol/L — ABNORMAL LOW (ref 98–111)
Creatinine, Ser: 1.08 mg/dL — ABNORMAL HIGH (ref 0.44–1.00)
GFR, Estimated: 49 mL/min — ABNORMAL LOW (ref >60)
Glucose, Bld: 93 mg/dL (ref 70–99)
Potassium: 4 mmol/L (ref 3.5–5.1)
Sodium: 135 mmol/L (ref 135–145)

## 2022-10-04 LAB — TROPONIN I (HIGH SENSITIVITY): Troponin I (High Sensitivity): 10 ng/L (ref <18)

## 2022-10-04 NOTE — Assessment & Plan Note (Addendum)
Vital signs, exam and EKG- WNL. Will contact patient with results from lab work and respiratory swab. Encouraged patient to seek care if shortness of breath worsens or chest pain develops.

## 2022-10-04 NOTE — Assessment & Plan Note (Signed)
Mostly resolved. Patient is feeling less fatigued today.

## 2022-10-04 NOTE — Progress Notes (Signed)
Alexa Morrow, NP-C Phone: [phone]  Alexa Pham is a 88 y.o. female who presents today for increased shortness of breath and weakness.   Patient reports increased shortness of breath over the last 2-3 days. She also reports increased fatigue and feeling very weak the last 2 days. She does report feeling better today in terms of weakness and fatigue however she is still experiencing shortness of breath. Denies any other respiratory symptoms, such as cough, congestion or fever.  She states that she has only been taking her Irbesartan '150mg'$  once daily in the mornings instead of twice daily. She reports this has made her feel better, however her blood pressures have been slightly elevated.   HYPERTENSION Disease Monitoring Home BP Monitoring Yes, stable. Chest pain- No    Dyspnea- Yes Lightheadedness-  No  Edema- No BMET    Component Value Date/Time   NA 135 10/04/2022 1441   K 4.0 10/04/2022 1441   CL 93 (L) 10/04/2022 1441   CO2 33 (H) 10/04/2022 1441   GLUCOSE 93 10/04/2022 1441   BUN 25 (H) 10/04/2022 1441   CREATININE 1.08 (H) 10/04/2022 1441   CREATININE 0.81 01/02/2019 1646   CALCIUM 9.1 10/04/2022 1441   GFRNONAA 49 (L) 10/04/2022 1441   GFRAA >60 05/05/2020 0433     Social History   Tobacco Use  Smoking Status Former   Packs/day: 1.50   Years: 6.00   Total pack years: 9.00   Types: Cigarettes   Quit date: 1975   Years since quitting: 49.0  Smokeless Tobacco Never    Current Outpatient Medications on File Prior to Visit  Medication Sig Dispense Refill   albuterol (VENTOLIN HFA) 108 (90 Base) MCG/ACT inhaler Inhale 2 puffs into the lungs every 6 (six) hours as needed for wheezing or shortness of breath. 18 g 0   amLODipine (NORVASC) 10 MG tablet TAKE 1 TABLET(10 MG) BY MOUTH DAILY 90 tablet 1   aspirin EC 81 MG EC tablet Take 1 tablet (81 mg total) by mouth daily. Swallow whole. 30 tablet 0   atorvastatin (LIPITOR) 40 MG tablet TAKE 1 TABLET(40 MG) BY MOUTH  DAILY 90 tablet 1   calcium citrate-vitamin D (CITRACAL+D) 315-200 MG-UNIT tablet Take 1 tablet by mouth 2 (two) times daily.     clopidogrel (PLAVIX) 75 MG tablet Take 1 tablet (75 mg total) by mouth daily. 90 tablet 1   ibandronate (BONIVA) 150 MG tablet Take 1 tablet (150 mg total) by mouth every 30 (thirty) days. Take in the morning with a full glass of water, on an empty stomach, and do not take anything else by mouth or lie down for the next 30 min. 12 tablet 1   irbesartan (AVAPRO) 150 MG tablet Take 1 tablet (150 mg total) by mouth 2 (two) times daily. 180 tablet 1   levothyroxine (SYNTHROID) 150 MCG tablet Take 1 tablet (150 mcg total) by mouth daily. 90 tablet 3   metoprolol succinate (TOPROL-XL) 50 MG 24 hr tablet TAKE 1 TABLET(50 MG) BY MOUTH EVERY DAY WITH OR IMMEDIATELY FOLLOWING A MEAL 90 tablet 2   Multiple Vitamin (MULTIVITAMIN) capsule Take 1 capsule by mouth daily.     Multiple Vitamins-Minerals (PRESERVISION AREDS 2 PO) Take 1 tablet by mouth 2 (two) times daily.      predniSONE (DELTASONE) 10 MG tablet Take 4 tablets x 1 day and then decrease by 1/2 tablet per day until down to zero mg. 18 tablet 0   Vibegron (GEMTESA) 75 MG TABS Take  75 mg by mouth daily. 90 tablet 2   No current facility-administered medications on file prior to visit.     ROS see history of present illness  Objective  Physical Exam Vitals:   10/04/22 1319  BP: 136/74  Pulse: 66  Temp: 98.2 F (36.8 C)  SpO2: 95%    BP Readings from Last 3 Encounters:  10/04/22 136/74  09/21/22 124/70  06/14/22 116/62   Wt Readings from Last 3 Encounters:  10/04/22 119 lb 3.2 oz (54.1 kg)  09/21/22 119 lb 6.4 oz (54.2 kg)  06/14/22 110 lb 12.8 oz (50.3 kg)    Physical Exam Constitutional:      General: She is not in acute distress.    Appearance: Normal appearance.  HENT:     Head: Normocephalic.  Cardiovascular:     Rate and Rhythm: Normal rate and regular rhythm.     Heart sounds: Normal heart  sounds.  Pulmonary:     Effort: Pulmonary effort is normal. No respiratory distress.     Breath sounds: Normal breath sounds.  Skin:    General: Skin is warm and dry.  Neurological:     General: No focal deficit present.     Mental Status: She is alert.  Psychiatric:        Mood and Affect: Mood normal.        Behavior: Behavior normal.        Thought Content: Thought content normal.    Assessment/Plan: Please see individual problem list.  Shortness of breath Assessment & Plan: Vital signs, exam and EKG- WNL. Will contact patient with results from lab work and respiratory swab. Encouraged patient to seek care if shortness of breath worsens or chest pain develops.  Orders: -     EKG 12-Lead -     CBC with Differential/Platelet; Future -     Troponin I (High Sensitivity); Future -     COVID-19, Flu A+B and RSV  Primary hypertension Assessment & Plan: BP stable today. Continue Norvasc '10mg'$  daily, Avapro '150mg'$  BID and Toprol XL '50mg'$  QD. Follow up with Cardiology on 01/08 as scheduled.   Orders: -     EKG 12-Lead -     Basic metabolic panel; Future -     Troponin I (High Sensitivity); Future  Other fatigue Assessment & Plan: Mostly resolved. Patient is feeling less fatigued today.  Orders: -     EKG 12-Lead -     CBC with Differential/Platelet; Future -     COVID-19, Flu A+B and RSV    Return if symptoms worsen or fail to improve.   Alexa Morrow, NP-C [place]

## 2022-10-04 NOTE — Assessment & Plan Note (Signed)
BP stable today. Continue Norvasc '10mg'$  daily, Avapro '150mg'$  BID and Toprol XL '50mg'$  QD. Follow up with Cardiology on 01/08 as scheduled.

## 2022-10-06 LAB — COVID-19, FLU A+B AND RSV
Influenza A, NAA: NOT DETECTED
Influenza B, NAA: NOT DETECTED
RSV, NAA: NOT DETECTED
SARS-CoV-2, NAA: NOT DETECTED

## 2022-10-11 ENCOUNTER — Ambulatory Visit (INDEPENDENT_AMBULATORY_CARE_PROVIDER_SITE_OTHER): Payer: Medicare Other | Admitting: Internal Medicine

## 2022-10-11 ENCOUNTER — Ambulatory Visit: Payer: Medicare Other

## 2022-10-11 ENCOUNTER — Telehealth: Payer: Self-pay

## 2022-10-11 VITALS — BP 122/60 | HR 64 | Temp 97.6°F | Ht 66.0 in

## 2022-10-11 VITALS — BP 122/60 | HR 64 | Temp 97.6°F | Resp 17 | Ht 66.0 in

## 2022-10-11 DIAGNOSIS — R0602 Shortness of breath: Secondary | ICD-10-CM

## 2022-10-11 DIAGNOSIS — I700 Atherosclerosis of aorta: Secondary | ICD-10-CM

## 2022-10-11 DIAGNOSIS — R7989 Other specified abnormal findings of blood chemistry: Secondary | ICD-10-CM

## 2022-10-11 DIAGNOSIS — I10 Essential (primary) hypertension: Secondary | ICD-10-CM

## 2022-10-11 DIAGNOSIS — E7800 Pure hypercholesterolemia, unspecified: Secondary | ICD-10-CM

## 2022-10-11 DIAGNOSIS — J479 Bronchiectasis, uncomplicated: Secondary | ICD-10-CM

## 2022-10-11 DIAGNOSIS — I951 Orthostatic hypotension: Secondary | ICD-10-CM | POA: Diagnosis not present

## 2022-10-11 DIAGNOSIS — E039 Hypothyroidism, unspecified: Secondary | ICD-10-CM

## 2022-10-11 DIAGNOSIS — N2889 Other specified disorders of kidney and ureter: Secondary | ICD-10-CM | POA: Diagnosis not present

## 2022-10-11 DIAGNOSIS — I2510 Atherosclerotic heart disease of native coronary artery without angina pectoris: Secondary | ICD-10-CM

## 2022-10-11 LAB — BASIC METABOLIC PANEL
BUN: 20 mg/dL (ref 6–23)
CO2: 38 mEq/L — ABNORMAL HIGH (ref 19–32)
Calcium: 9.1 mg/dL (ref 8.4–10.5)
Chloride: 92 mEq/L — ABNORMAL LOW (ref 96–112)
Creatinine, Ser: 0.88 mg/dL (ref 0.40–1.20)
GFR: 57.64 mL/min — ABNORMAL LOW (ref >60.00)
Glucose, Bld: 99 mg/dL (ref 70–99)
Potassium: 4.3 mEq/L (ref 3.5–5.1)
Sodium: 135 mEq/L (ref 135–145)

## 2022-10-11 NOTE — Telephone Encounter (Signed)
Patient walked in for telephone scheduled wellness appointment. Patient having symptoms of shortness of breath. AWV rescheduled for another day. Patient cared for by Team Lead Clinician and scheduled with PCP.

## 2022-10-11 NOTE — Progress Notes (Signed)
Subjective:    [name], female    DOB: 1930/09/02, 89 y.o.   MRN: [id number]  Patient here for acute visit.   HPI Work in for [employer]. Per Ms Eide - she thought she had an appt this am.  Came in and mentioned she has noticed more sob over the last two weeks. She was seen yesterday by cardiology ([facility]).  Reported to them - two weeks of exertional dyspnea and fatigue.  No chest pain.  EKG performed yesterday - sinus arrhythmia.  They instructed her to stop her metoprolol and start carvedilol.  She continues on amlodipine and hctz.  Only taking avapro '150mg'$  q day.  She adjusted this on her own.  She has a f/u appt with Dr Sheppard Coil Monday 10/15/22. On questioning her today, she reports that no acute change in symptoms today.  Has noticed a persistent sob and weakness over the last couple of weeks.  She was recently started on hctz 12.'5mg'$  q day.  No syncope.  No headache.  No fever, cough or congestion.  Eating.  No vomiting or diarrhea.     Past Medical History:  Diagnosis Date   Actinic keratosis 01/28/2020   R thumb proximal nail fold    Atypical mole 05/13/2018   R distal lat thigh   Atypical mole 09/14/2014   left ant mid thigh   Atypical mole 01/19/2014   left paraspinal mid to upper back   Atypical mole 01/19/2014   right back mid to inf scapula   Atypical mole 01/19/2014   left posterior deltoid medial   Basal cell carcinoma 01/28/2020   left of midline mid dorsum nose   Basal cell carcinoma 01/06/2018   left preauricular    Basal cell carcinoma 11/22/2016   left upper lip   Basal cell carcinoma 03/17/2015   left lateral elbow   Basal cell carcinoma 07/22/2014   R medial inferior knee   Basal cell carcinoma 07/21/2020   Left dorsum hand. Nodular pattern. Excised 08/09/2020, margins free.   Basal cell carcinoma 02/23/2021   right dorsal hand (BCC/SCC), left dorsal hand, right chin - treated with EDC   Family history of breast cancer    History of breast cancer     History of pneumonia    Hypertension    Hypothyroidism    Macular degeneration    Squamous cell carcinoma of skin 01/06/2018   right dorsum hand at ring finger MCP   Squamous cell carcinoma of skin 07/08/2017   left distal dorsum forearm    Past Surgical History:  Procedure Laterality Date   ABDOMINAL HYSTERECTOMY     ABDOMINAL HYSTERECTOMY     APPENDECTOMY     BREAST LUMPECTOMY     CATARACT EXTRACTION, BILATERAL     CHOLECYSTECTOMY     CORONARY STENT INTERVENTION N/A 07/26/2020   Procedure: CORONARY STENT INTERVENTION;  Surgeon: Isaias Cowman, MD;  Location: [place] [facility];  Service: Cardiovascular;  Laterality: N/A;   LEFT HEART CATH AND CORONARY ANGIOGRAPHY Left 07/26/2020   Procedure: LEFT HEART CATH AND CORONARY ANGIOGRAPHY;  Surgeon: Corey Skains, MD;  Location: [place] [facility];  Service: Cardiovascular;  Laterality: Left;   REPLACEMENT TOTAL KNEE     TONSILLECTOMY AND ADENOIDECTOMY     Family History  Problem Relation Age of Onset   CAD Mother    Cancer Maternal Aunt        unk type   Leukemia Maternal Grandmother    Breast cancer  Half-Sister        dx 14s   Social History   Socioeconomic History   Marital status: Widowed    Spouse name: Not on file   Number of children: Not on file   Years of education: Not on file   Highest education level: Not on file  Occupational History   Not on file  Tobacco Use   Smoking status: Former    Packs/day: 1.50    Years: 6.00    Total pack years: 9.00    Types: Cigarettes    Quit date: 86    Years since quitting: 49.0   Smokeless tobacco: Never  Substance and Sexual Activity   Alcohol use: No    Alcohol/week: 0.0 standard drinks of alcohol   Drug use: No   Sexual activity: Not on file  Other Topics Concern   Not on file  Social History Narrative   Married    Social Determinants of Health   Financial Resource Strain: Low Risk  (10/10/2021)   Overall Financial Resource Strain (CARDIA)     Difficulty of Paying Living Expenses: Not hard at all  Food Insecurity: No Food Insecurity (10/10/2021)   Hunger Vital Sign    Worried About Running Out of Food in the Last Year: Never true    [place] in the Last Year: Never true  Transportation Needs: No Transportation Needs (10/10/2021)   PRAPARE - Hydrologist (Medical): No    Lack of Transportation (Non-Medical): No  Physical Activity: Unknown (08/22/2020)   Exercise Vital Sign    Days of Exercise per Week: 0 days    Minutes of Exercise per Session: Not on file  Stress: No Stress Concern Present (10/10/2021)   [place]    Feeling of Stress : Not at all  Social Connections: Moderately Integrated (10/10/2021)   Social Connection and Isolation Panel [NHANES]    Frequency of Communication with Friends and Family: More than three times a week    Frequency of Social Gatherings with Friends and Family: More than three times a week    Attends Religious Services: More than 4 times per year    Active Member of [employer] or Organizations: Yes    Attends Archivist Meetings: More than 4 times per year    Marital Status: Widowed     Review of Systems  Constitutional:  Positive for fatigue. Negative for appetite change and unexpected weight change.  HENT:  Negative for congestion and sinus pressure.   Respiratory:  Positive for shortness of breath. Negative for cough and chest tightness.   Cardiovascular:  Negative for chest pain and palpitations.       Lower extremity swelling - stable.   Gastrointestinal:  Negative for abdominal pain, diarrhea, nausea and vomiting.  Genitourinary:  Negative for difficulty urinating and dysuria.  Musculoskeletal:  Negative for joint swelling and myalgias.  Skin:  Negative for color change and rash.  Neurological:  Negative for dizziness and headaches.  Psychiatric/Behavioral:  Negative for agitation  and dysphoric mood.        Objective:     BP 122/60   Pulse 64   Temp 97.6 F (36.4 C)   Ht '5\' 6"'$  (1.676 m)   SpO2 94%   BMI 19.24 kg/m  Wt Readings from Last 3 Encounters:  10/04/22 119 lb 3.2 oz (54.1 kg)  09/21/22 119 lb 6.4 oz (54.2 kg)  06/14/22 110  lb 12.8 oz (50.3 kg)    Physical Exam Vitals reviewed.  Constitutional:      General: She is not in acute distress.    Appearance: Normal appearance.  HENT:     Head: Normocephalic and atraumatic.     Right Ear: External ear normal.     Left Ear: External ear normal.  Eyes:     General: No scleral icterus.       Right eye: No discharge.        Left eye: No discharge.     Conjunctiva/sclera: Conjunctivae normal.  Neck:     Thyroid: No thyromegaly.  Cardiovascular:     Rate and Rhythm: Normal rate and regular rhythm.  Pulmonary:     Effort: No respiratory distress.     Breath sounds: Normal breath sounds. No wheezing.  Abdominal:     General: Bowel sounds are normal.     Palpations: Abdomen is soft.     Tenderness: There is no abdominal tenderness.  Musculoskeletal:        General: No tenderness.     Cervical back: Neck supple. No tenderness.     Comments: Lower extremity swelling - stable - compression socks.   Lymphadenopathy:     Cervical: No cervical adenopathy.  Skin:    Findings: No erythema or rash.  Neurological:     Mental Status: She is alert.  Psychiatric:        Mood and Affect: Mood normal.        Behavior: Behavior normal.      Outpatient Encounter Medications as of 10/11/2022  Medication Sig   albuterol (VENTOLIN HFA) 108 (90 Base) MCG/ACT inhaler Inhale 2 puffs into the lungs every 6 (six) hours as needed for wheezing or shortness of breath.   amLODipine (NORVASC) 10 MG tablet TAKE 1 TABLET(10 MG) BY MOUTH DAILY   aspirin EC 81 MG EC tablet Take 1 tablet (81 mg total) by mouth daily. Swallow whole.   atorvastatin (LIPITOR) 40 MG tablet TAKE 1 TABLET(40 MG) BY MOUTH DAILY   calcium  citrate-vitamin D (CITRACAL+D) 315-200 MG-UNIT tablet Take 1 tablet by mouth 2 (two) times daily.   carvedilol (COREG) 6.25 MG tablet Take 6.25 mg by mouth 2 (two) times daily with a meal.   clopidogrel (PLAVIX) 75 MG tablet Take 1 tablet (75 mg total) by mouth daily.   ibandronate (BONIVA) 150 MG tablet Take 1 tablet (150 mg total) by mouth every 30 (thirty) days. Take in the morning with a full glass of water, on an empty stomach, and do not take anything else by mouth or lie down for the next 30 min.   irbesartan (AVAPRO) 150 MG tablet Take 1 tablet (150 mg total) by mouth 2 (two) times daily.   irbesartan (AVAPRO) 150 MG tablet Take 150 mg by mouth daily.   levothyroxine (SYNTHROID) 150 MCG tablet Take 1 tablet (150 mcg total) by mouth daily.   Multiple Vitamin (MULTIVITAMIN) capsule Take 1 capsule by mouth daily.   Multiple Vitamins-Minerals (PRESERVISION AREDS 2 PO) Take 1 tablet by mouth 2 (two) times daily.    Vibegron (GEMTESA) 75 MG TABS Take 75 mg by mouth daily.   No facility-administered encounter medications on file as of 10/11/2022.     Lab Results  Component Value Date   WBC 7.6 10/04/2022   HGB 14.0 10/04/2022   HCT 41.7 10/04/2022   PLT 365 10/04/2022   GLUCOSE 99 10/11/2022   CHOL 170 09/18/2022   TRIG 70.0  09/18/2022   HDL 73.50 09/18/2022   LDLCALC 82 09/18/2022   ALT 12 09/18/2022   AST 26 09/18/2022   NA 135 10/11/2022   K 4.3 10/11/2022   CL 92 (L) 10/11/2022   CREATININE 0.88 10/11/2022   BUN 20 10/11/2022   CO2 38 (H) 10/11/2022   TSH 7.06 (H) 09/18/2022   INR 1.1 02/13/2022   HGBA1C 5.6 02/13/2022    No results found.     Assessment & Plan:  Primary hypertension Assessment & Plan: Has noticed over the last couple of weeks what she describes as sob with exertion.  Also reports increased fatigue and weakness.  If stands for a period of time, feels weak and feel like she needs to sit down.  No syncope.  No headache or dizziness.  No chest pain.  Just  saw yesterday as outlined.  Concern regarding elevated blood pressure.  Metoprolol was stopped.  Started carvediolol.  Instructed to continue avapro (she is taking daily), amlodipine and hctz.  Did not repeat EKG today given EKG yesterday by cardiology and no new symptoms.  Blood pressure today standing 106-108/60s and lying down pressure 124/60-70s.  Discussed the possibility of orthostatic hypotension.  Will have her hold hctz for now.  Follow pressures closely.  Call tomorrow with update.  Keep appt with cardiology on Monday.   Check metabolic panel today. Need to confirm electrolytes and kidney function - ok.    Shortness of breath Assessment & Plan: Has noticed over the last couple of weeks what she describes as sob with exertion.  Also reports increased fatigue and weakness.  If stands for a period of time, feels weak and feel like she needs to sit down.  No syncope.  No headache or dizziness.  No chest pain.  Just saw yesterday as outlined.  Concern regarding elevated blood pressure.  Metoprolol was stopped.  Started carvediolol.  Instructed to continue avapro (she is taking daily), amlodipine and hctz.  Did not repeat EKG today given EKG yesterday by cardiology and no new symptoms.  Blood pressure today standing 106-108/60s and lying down pressure 124/60-70s.  Discussed the possibility of orthostatic hypotension.  Will have her hold hctz for now.  Follow pressures closely.  Call tomorrow with update.  Keep appt with cardiology on Monday.    Orders: -     Basic metabolic panel  Orthostatic hypotension Assessment & Plan: Has noticed over the last couple of weeks what she describes as sob with exertion.  Also reports increased fatigue and weakness.  If stands for a period of time, feels weak and feel like she needs to sit down.  No syncope.  No headache or dizziness.  No chest pain.  Just saw yesterday as outlined.  Concern regarding elevated blood pressure.  Metoprolol was stopped.  Started  carvediolol.  Instructed to continue avapro (she is taking daily), amlodipine and hctz.  Did not repeat EKG today given EKG yesterday by cardiology and no new symptoms.  Blood pressure today standing 106-108/60s and lying down pressure 124/60-70s.  Discussed the possibility of orthostatic hypotension.  Will have her hold hctz for now.  Follow pressures closely.  Call tomorrow with update.  Keep appt with cardiology on Monday.    Orders: -     Basic metabolic panel  Kidney mass Assessment & Plan: Saw Dr Erlene Quan 01/2022.   CT scan is unchanged from previous scan, 6 months ago which is reassuring with no more likely indolent malignancy. Continue to recommend active surveillance  Plan for repeat scan in 9 months    Hypothyroidism, unspecified type Assessment & Plan: On thyroid replacement.  Follow tsh.    Hypercholesterolemia Assessment & Plan: On lipitor. Follow lipid panel and liver function tests.    Coronary artery disease involving native coronary artery of native heart without angina pectoris Assessment & Plan: Recent admission for NSTEMI.  ECHO with no wall motion abnormality.  EF 60%.  Recommended dual antiplatelet therapy and statin medication.  No chest pain. SOB as outlined.  Medication adjusted.  See below for details.     Bronchiectasis without complication ([place]) Assessment & Plan: Some sob with exertion.  No increased cough or congestion.  Changes in medication as outlined.  Pursue cardiac evaluation.  Follow.     Aortic atherosclerosis ([facility]CC) Assessment & Plan: Continue lipitor.    Abnormal liver function test Assessment & Plan: Recent liver panel wnl.  Abdominal ultrasound - liver ok.  Discussed with GI. Cancel appt.  Follow liver function tests.        Einar Pheasant, MD

## 2022-10-12 ENCOUNTER — Telehealth: Payer: Self-pay

## 2022-10-12 NOTE — Telephone Encounter (Signed)
Left message to call office back regarding Dr. Bary Leriche message.

## 2022-10-12 NOTE — Telephone Encounter (Signed)
-----   Message from Einar Pheasant, MD sent at 10/12/2022  4:59 AM EST ----- Notify - kidney function has improved from last check.  Sodium and potassium wnl.  I had her hold her hctz yesterday.  Please confirm how she is doing and how her blood pressure is doing.

## 2022-10-14 ENCOUNTER — Encounter: Payer: Self-pay | Admitting: Internal Medicine

## 2022-10-14 NOTE — Assessment & Plan Note (Signed)
On thyroid replacement.  Follow tsh.  

## 2022-10-14 NOTE — Assessment & Plan Note (Addendum)
Has noticed over the last couple of weeks what she describes as sob with exertion.  Also reports increased fatigue and weakness.  If stands for a period of time, feels weak and feel like she needs to sit down.  No syncope.  No headache or dizziness.  No chest pain.  Just saw yesterday as outlined.  Concern regarding elevated blood pressure.  Metoprolol was stopped.  Started carvediolol.  Instructed to continue avapro (she is taking daily), amlodipine and hctz.  Did not repeat EKG today given EKG yesterday by cardiology and no new symptoms.  Blood pressure today standing 106-108/60s and lying down pressure 124/60-70s.  Discussed the possibility of orthostatic hypotension.  Will have her hold hctz for now.  Follow pressures closely.  Call tomorrow with update.  Keep appt with cardiology on Monday.   Check metabolic panel today. Need to confirm electrolytes and kidney function - ok.

## 2022-10-14 NOTE — Assessment & Plan Note (Signed)
On lipitor.  Follow lipid panel and liver function tests.   

## 2022-10-14 NOTE — Assessment & Plan Note (Addendum)
Has noticed over the last couple of weeks what she describes as sob with exertion.  Also reports increased fatigue and weakness.  If stands for a period of time, feels weak and feel like she needs to sit down.  No syncope.  No headache or dizziness.  No chest pain.  Just saw yesterday as outlined.  Concern regarding elevated blood pressure.  Metoprolol was stopped.  Started carvediolol.  Instructed to continue avapro (she is taking daily), amlodipine and hctz.  Did not repeat EKG today given EKG yesterday by cardiology and no new symptoms.  Blood pressure today standing 106-108/60s and lying down pressure 124/60-70s.  Discussed the possibility of orthostatic hypotension.  Will have her hold hctz for now.  Follow pressures closely.  Call tomorrow with update.  Keep appt with cardiology on Monday.

## 2022-10-14 NOTE — Assessment & Plan Note (Signed)
Recent liver panel wnl.  Abdominal ultrasound - liver ok.  Discussed with GI. Cancel appt.  Follow liver function tests.

## 2022-10-14 NOTE — Assessment & Plan Note (Signed)
Some sob with exertion.  No increased cough or congestion.  Changes in medication as outlined.  Pursue cardiac evaluation.  Follow.

## 2022-10-14 NOTE — Assessment & Plan Note (Signed)
Has noticed over the last couple of weeks what she describes as sob with exertion.  Also reports increased fatigue and weakness.  If stands for a period of time, feels weak and feel like she needs to sit down.  No syncope.  No headache or dizziness.  No chest pain.  Just saw yesterday as outlined.  Concern regarding elevated blood pressure.  Metoprolol was stopped.  Started carvediolol.  Instructed to continue avapro (she is taking daily), amlodipine and hctz.  Did not repeat EKG today given EKG yesterday by cardiology and no new symptoms.  Blood pressure today standing 106-108/60s and lying down pressure 124/60-70s.  Discussed the possibility of orthostatic hypotension.  Will have her hold hctz for now.  Follow pressures closely.  Call tomorrow with update.  Keep appt with cardiology on Monday.

## 2022-10-14 NOTE — Assessment & Plan Note (Signed)
Recent admission for NSTEMI.  ECHO with no wall motion abnormality.  EF 60%.  Recommended dual antiplatelet therapy and statin medication.  No chest pain. SOB as outlined.  Medication adjusted.  See below for details.

## 2022-10-14 NOTE — Assessment & Plan Note (Signed)
Saw Dr Erlene Quan 01/2022.   CT scan is unchanged from previous scan, 6 months ago which is reassuring with no more likely indolent malignancy. Continue to recommend active surveillance Plan for repeat scan in 9 months

## 2022-10-14 NOTE — Assessment & Plan Note (Signed)
Continue lipitor  ?

## 2022-10-16 ENCOUNTER — Encounter: Payer: Self-pay | Admitting: Internal Medicine

## 2022-10-17 NOTE — Telephone Encounter (Signed)
Per pt - does NOT want alternative referral placed. Stated is on cancellation list with [facility] pulmo - will wait. Stated has cruise coming up that she missed last year due to her heart attack, and does not want to risk having to cancel it again. Will wait to see if a cancellation comes up.  Again told pt to continue as Dr Sheppard Coil had recommended - d/c amlodipine, continue HCTZ. Pt gave verbal understanding.  Has appt Monday to come in for f/u with you

## 2022-10-17 NOTE — Telephone Encounter (Signed)
Reviewed.  Tried to call Alexa Pham.  Left message on machine. Blood pressure appears to be doing ok on current regimen.  Will continue medications as directed last by Dr Sheppard Coil as outlined.  See if she would like for me to place order for pulmonary MD here and see if can get appt earlier than 01/2023.  If any acute issues, let us know.

## 2022-10-19 ENCOUNTER — Telehealth: Payer: Self-pay | Admitting: Internal Medicine

## 2022-10-19 ENCOUNTER — Other Ambulatory Visit: Payer: Self-pay

## 2022-10-19 DIAGNOSIS — N289 Disorder of kidney and ureter, unspecified: Secondary | ICD-10-CM

## 2022-10-19 DIAGNOSIS — E039 Hypothyroidism, unspecified: Secondary | ICD-10-CM

## 2022-10-19 NOTE — Telephone Encounter (Signed)
Orders placed

## 2022-10-19 NOTE — Telephone Encounter (Signed)
Patient has a lab appt 10/22/2022, there are no orders in.

## 2022-10-22 ENCOUNTER — Ambulatory Visit (INDEPENDENT_AMBULATORY_CARE_PROVIDER_SITE_OTHER): Payer: Medicare Other

## 2022-10-22 ENCOUNTER — Other Ambulatory Visit (INDEPENDENT_AMBULATORY_CARE_PROVIDER_SITE_OTHER): Payer: Medicare Other

## 2022-10-22 VITALS — Ht 66.0 in | Wt 119.0 lb

## 2022-10-22 DIAGNOSIS — Z0000 Encounter for general adult medical examination without abnormal findings: Secondary | ICD-10-CM | POA: Diagnosis not present

## 2022-10-22 DIAGNOSIS — N289 Disorder of kidney and ureter, unspecified: Secondary | ICD-10-CM

## 2022-10-22 LAB — BASIC METABOLIC PANEL
BUN: 21 mg/dL (ref 6–23)
CO2: 40 mEq/L — ABNORMAL HIGH (ref 19–32)
Calcium: 9.8 mg/dL (ref 8.4–10.5)
Chloride: 89 mEq/L — ABNORMAL LOW (ref 96–112)
Creatinine, Ser: 1 mg/dL (ref 0.40–1.20)
GFR: 49.43 mL/min — ABNORMAL LOW (ref >60.00)
Glucose, Bld: 91 mg/dL (ref 70–99)
Potassium: 3.6 mEq/L (ref 3.5–5.1)
Sodium: 137 mEq/L (ref 135–145)

## 2022-10-22 NOTE — Progress Notes (Signed)
Subjective:   Alexa Pham is a 89 y.o. female who presents for Medicare Annual (Subsequent) preventive examination.  Review of Systems    No ROS.  Medicare Wellness Virtual Visit.  Visual/audio telehealth visit, UTA vital signs.   See social history for additional risk factors.   Cardiac Risk Factors include: advanced age (>38mn, >>18women)     Objective:    Today's Vitals   10/22/22 1543  Weight: 119 lb (54 kg)  Height: '5\' 6"'$  (1.676 m)   Body mass index is 19.21 kg/m.     10/22/2022    3:51 PM 02/14/2022    9:27 AM 02/13/2022   10:38 AM 10/10/2021    9:07 AM 07/11/2021   11:20 AM 08/22/2020   11:10 AM 08/08/2020    9:52 AM  Advanced Directives  Does Patient Have a Medical Advance Directive? Yes  No Yes Yes Yes Yes  Type of [place]   [place] Healthcare Power of Attorney Living will Living will;Healthcare Power of Attorney  Does patient want to make changes to medical advance directive? No - Patient declined   No - Patient declined     Copy of HMartinezin Chart? Yes - validated most recent copy scanned in chart (See row information)   No - copy requested     Would patient like information on creating a medical advance directive?  No - Patient declined         Current Medications (verified) Outpatient Encounter Medications as of 10/22/2022  Medication Sig   albuterol (VENTOLIN HFA) 108 (90 Base) MCG/ACT inhaler Inhale 2 puffs into the lungs every 6 (six) hours as needed for wheezing or shortness of breath.   amLODipine (NORVASC) 10 MG tablet TAKE 1 TABLET(10 MG) BY MOUTH DAILY   aspirin EC 81 MG EC tablet Take 1 tablet (81 mg total) by mouth daily. Swallow whole.   atorvastatin (LIPITOR) 40 MG tablet TAKE 1 TABLET(40 MG) BY MOUTH DAILY   calcium citrate-vitamin D (CITRACAL+D) 315-200 MG-UNIT tablet Take 1 tablet by mouth 2 (two) times daily.   carvedilol (COREG) 6.25 MG  tablet Take 6.25 mg by mouth 2 (two) times daily with a meal.   clopidogrel (PLAVIX) 75 MG tablet Take 1 tablet (75 mg total) by mouth daily.   ibandronate (BONIVA) 150 MG tablet Take 1 tablet (150 mg total) by mouth every 30 (thirty) days. Take in the morning with a full glass of water, on an empty stomach, and do not take anything else by mouth or lie down for the next 30 min.   irbesartan (AVAPRO) 150 MG tablet Take 1 tablet (150 mg total) by mouth 2 (two) times daily.   irbesartan (AVAPRO) 150 MG tablet Take 150 mg by mouth daily.   levothyroxine (SYNTHROID) 150 MCG tablet Take 1 tablet (150 mcg total) by mouth daily.   Multiple Vitamin (MULTIVITAMIN) capsule Take 1 capsule by mouth daily.   Multiple Vitamins-Minerals (PRESERVISION AREDS 2 PO) Take 1 tablet by mouth 2 (two) times daily.    Vibegron (GEMTESA) 75 MG TABS Take 75 mg by mouth daily.   No facility-administered encounter medications on file as of 10/22/2022.    Allergies (verified) Ticagrelor   History: Past Medical History:  Diagnosis Date   Actinic keratosis 01/28/2020   R thumb proximal nail fold    Atypical mole 05/13/2018   R distal lat thigh   Atypical mole 09/14/2014   left  ant mid thigh   Atypical mole 01/19/2014   left paraspinal mid to upper back   Atypical mole 01/19/2014   right back mid to inf scapula   Atypical mole 01/19/2014   left posterior deltoid medial   Basal cell carcinoma 01/28/2020   left of midline mid dorsum nose   Basal cell carcinoma 01/06/2018   left preauricular    Basal cell carcinoma 11/22/2016   left upper lip   Basal cell carcinoma 03/17/2015   left lateral elbow   Basal cell carcinoma 07/22/2014   R medial inferior knee   Basal cell carcinoma 07/21/2020   Left dorsum hand. Nodular pattern. Excised 08/09/2020, margins free.   Basal cell carcinoma 02/23/2021   right dorsal hand (BCC/SCC), left dorsal hand, right chin - treated with EDC   Family history of breast cancer     History of breast cancer    History of pneumonia    Hypertension    Hypothyroidism    Macular degeneration    Squamous cell carcinoma of skin 01/06/2018   right dorsum hand at ring finger MCP   Squamous cell carcinoma of skin 07/08/2017   left distal dorsum forearm    Past Surgical History:  Procedure Laterality Date   ABDOMINAL HYSTERECTOMY     ABDOMINAL HYSTERECTOMY     APPENDECTOMY     BREAST LUMPECTOMY     CATARACT EXTRACTION, BILATERAL     CHOLECYSTECTOMY     CORONARY STENT INTERVENTION N/A 07/26/2020   Procedure: CORONARY STENT INTERVENTION;  Surgeon: Isaias Cowman, MD;  Location: [facility];  Service: Cardiovascular;  Laterality: N/A;   LEFT HEART CATH AND CORONARY ANGIOGRAPHY Left 07/26/2020   Procedure: LEFT HEART CATH AND CORONARY ANGIOGRAPHY;  Surgeon: Corey Skains, MD;  Location: [facility];  Service: Cardiovascular;  Laterality: Left;   REPLACEMENT TOTAL KNEE     TONSILLECTOMY AND ADENOIDECTOMY     Family History  Problem Relation Age of Onset   CAD Mother    Cancer Maternal Aunt        unk type   Leukemia Maternal Grandmother    Breast cancer Half-Sister        dx 37s   Social History   Socioeconomic History   Marital status: Widowed    Spouse name: Not on file   Number of children: Not on file   Years of education: Not on file   Highest education level: Not on file  Occupational History   Not on file  Tobacco Use   Smoking status: Former    Packs/day: 1.50    Years: 6.00    Total pack years: 9.00    Types: Cigarettes    Quit date: 41    Years since quitting: 49.0   Smokeless tobacco: Never  Substance and Sexual Activity   Alcohol use: No    Alcohol/week: 0.0 standard drinks of alcohol   Drug use: No   Sexual activity: Not on file  Other Topics Concern   Not on file  Social History Narrative   Married    Social Determinants of Health   Financial Resource Strain: Low Risk  (10/22/2022)   Overall Financial  Resource Strain (CARDIA)    Difficulty of Paying Living Expenses: Not hard at all  Food Insecurity: No Food Insecurity (10/22/2022)   Hunger Vital Sign    Worried About Running Out of Food in the Last Year: Never true    [place] in the Last Year:  Never true  Transportation Needs: No Transportation Needs (10/22/2022)   PRAPARE - Hydrologist (Medical): No    Lack of Transportation (Non-Medical): No  Physical Activity: Insufficiently Active (10/22/2022)   Exercise Vital Sign    Days of Exercise per Week: 2 days    Minutes of Exercise per Session: 40 min  Stress: No Stress Concern Present (10/22/2022)   [place]    Feeling of Stress : Not at all  Social Connections: Moderately Integrated (10/22/2022)   Social Connection and Isolation Panel [NHANES]    Frequency of Communication with Friends and Family: More than three times a week    Frequency of Social Gatherings with Friends and Family: More than three times a week    Attends Religious Services: More than 4 times per year    Active Member of [employer] or Organizations: Yes    Attends Archivist Meetings: More than 4 times per year    Marital Status: Widowed    Tobacco Counseling Counseling given: Not Answered   Clinical Intake:  Pre-visit preparation completed: Yes        Diabetes: No  How often do you need to have someone help you when you read instructions, pamphlets, or other written materials from your doctor or pharmacy?: 3 - Sometimes    Interpreter Needed?: No      Activities of Daily Living    10/22/2022    3:44 PM 02/14/2022    9:00 AM  In your present state of health, do you have any difficulty performing the following activities:  Hearing? 1 0  Comment Hearing aids   Vision? 0 0  Difficulty concentrating or making decisions? 0 0  Walking or climbing stairs? 0 0  Dressing or bathing? 0 0  Doing  errands, shopping? 0 0  Preparing Food and eating ? N   Using the Toilet? N   In the past six months, have you accidently leaked urine? Y   Comment Followed by pcp and Urology. Managed with daily depend brief.   Do you have problems with loss of bowel control? N   Managing your Medications? N   Managing your Finances? N   Comment Son assist as needed   Housekeeping or managing your Housekeeping? Y   Comment Maid assist     Patient Care Team: Einar Pheasant, MD as PCP - General (Internal Medicine)  Indicate any recent Medical Services you may have received from other than [facility] providers in the past year (date may be approximate).     Assessment:   This is a routine wellness examination for [place].  I connected with  [name] on 10/22/22 by a audio enabled telemedicine application and verified that I am speaking with the correct person using two identifiers.  Patient Location: Home  Provider Location: Office/Clinic  I discussed the limitations of evaluation and management by telemedicine. The patient expressed understanding and agreed to proceed.   Hearing/Vision screen Hearing Screening - Comments:: Hearing aid, bilateral  Vision Screening - Comments:: Wears corrective lenses  Macular- age related; drops in use Visits every 6 months    Dietary issues and exercise activities discussed: Current Exercise Habits: Home exercise routine, Time (Minutes): 45, Frequency (Times/Week): 2, Weekly Exercise (Minutes/Week): 90, Intensity: Mild   Goals Addressed               This Visit's Progress     Patient Stated  Maintain Healthy Lifestyle (pt-stated)        Healthy diet Stay active; walk for exercise as tolerated       Depression Screen    10/22/2022    3:47 PM 10/04/2022    1:21 PM 09/21/2022   10:04 AM 06/14/2022    2:25 PM 05/31/2022    8:58 AM 04/13/2022    1:57 PM 02/23/2022   11:08 AM  PHQ 2/9 Scores  PHQ - 2 Score 0 2 0 0 0 0 0  PHQ- 9 Score 0 9          Fall Risk    10/22/2022    3:52 PM 09/21/2022   10:04 AM 06/14/2022    2:25 PM 05/31/2022    8:58 AM 04/13/2022    1:57 PM  Fall Risk   Falls in the past year? 0 0 0 0 0  Number falls in past yr: 0 0 0 0 0  Injury with Fall? 0 0 0 0 0  Risk for fall due to :  No Fall Risks No Fall Risks No Fall Risks No Fall Risks  Follow up Falls evaluation completed;Falls prevention discussed Falls evaluation completed Falls evaluation completed Falls evaluation completed Falls evaluation completed    FALL RISK PREVENTION PERTAINING TO THE HOME: Home free of loose throw rugs in walkways, pet beds, electrical cords, etc? Yes  Adequate lighting in your home to reduce risk of falls? Yes   ASSISTIVE DEVICES UTILIZED TO PREVENT FALLS: Life alert? No  Use of a cane, walker or w/c? No   TIMED UP AND GO: Was the test performed? No .   Cognitive Function:        10/22/2022    3:52 PM 10/10/2021    9:18 AM 08/22/2020   11:27 AM 08/20/2019   11:16 AM  6CIT Screen  What Year? 0 points 0 points 0 points 0 points  What month? 0 points 0 points 0 points 0 points  What time? 0 points 0 points 0 points 0 points  Count back from 20  0 points 0 points 0 points  Months in reverse  0 points 0 points 0 points  Repeat phrase  0 points 0 points 0 points  Total Score  0 points 0 points 0 points    Immunizations Immunization History  Administered Date(s) Administered   Fluad Quad(high Dose 65+) 06/14/2019, 06/10/2020, 09/21/2021   Influenza, High Dose Seasonal PF 07/04/2015, 07/02/2017, 07/03/2018   Influenza-Unspecified 06/22/2016   PFIZER Comirnaty(Gray Top)Covid-19 Tri-Sucrose Vaccine 11/06/2019, 11/27/2019, 07/11/2020   PFIZER(Purple Top)SARS-COV-2 Vaccination 11/06/2019, 11/27/2019   Pneumococcal Conjugate-13 04/21/2016   Pneumococcal Polysaccharide-23 05/05/2012   Tdap 11/09/2014   Screening Tests Health Maintenance  Topic Date Due   COVID-19 Vaccine (6 - 2023-24 season) 11/07/2022 (Originally  06/08/2022)   Zoster Vaccines- Shingrix (1 of 2) 12/21/2022 (Originally 10/23/1948)   INFLUENZA VACCINE  01/06/2023 (Originally 05/08/2022)   Medicare Annual Wellness (AWV)  10/23/2023   DTaP/Tdap/Td (2 - Td or Tdap) 11/09/2024   Pneumonia Vaccine 36+ Years old  Completed   DEXA SCAN  Completed   HPV VACCINES  Aged Out   Health Maintenance There are no preventive care reminders to display for this patient.  Lung Cancer Screening: (Low Dose CT Chest recommended if Age 45-80 years, 30 pack-year currently smoking OR have quit w/in 15years.) does not qualify.   Hepatitis C Screening: does not qualify.  Vision Screening: Recommended annual ophthalmology exams for early detection of glaucoma and other disorders of the  eye.  Dental Screening: Recommended annual dental exams for proper oral hygiene  Community Resource Referral / Chronic Care Management: CRR required this visit?  No   CCM required this visit?  No      Plan:     I have personally reviewed and noted the following in the patient's chart:   Medical and social history Use of alcohol, tobacco or illicit drugs  Current medications and supplements including opioid prescriptions. Patient is not currently taking opioid prescriptions. Functional ability and status Nutritional status Physical activity Advanced directives List of other physicians Hospitalizations, surgeries, and ER visits in previous 12 months Vitals Screenings to include cognitive, depression, and falls Referrals and appointments  In addition, I have reviewed and discussed with patient certain preventive protocols, quality metrics, and best practice recommendations. A written personalized care plan for preventive services as well as general preventive health recommendations were provided to patient.     Leta Jungling, LPN   12/03/3333

## 2022-10-22 NOTE — Patient Instructions (Addendum)
Alexa Pham , Thank you for taking time to come for your Medicare Wellness Visit. I appreciate your ongoing commitment to your health goals. Please review the following plan we discussed and let me know if I can assist you in the future.   These are the goals we discussed:  Goals       Patient Stated     Maintain Healthy Lifestyle (pt-stated)      Healthy diet Stay active; walk for exercise as tolerated        This is a list of the screening recommended for you and due dates:  Health Maintenance  Topic Date Due   COVID-19 Vaccine (6 - 2023-24 season) 11/07/2022*   Zoster (Shingles) Vaccine (1 of 2) 12/21/2022*   Flu Shot  01/06/2023*   Medicare Annual Wellness Visit  10/23/2023   DTaP/Tdap/Td vaccine (2 - Td or Tdap) 11/09/2024   Pneumonia Vaccine  Completed   DEXA scan (bone density measurement)  Completed   HPV Vaccine  Aged Out  *Topic was postponed. The date shown is not the original due date.    Advanced directives: End of life planning; Advance aging; Advanced directives discussed.  Copy of current HCPOA/Living Will requested.    Conditions/risks identified: none new  Next appointment: Follow up in one year for your annual wellness visit    Preventive Care 65 Years and Older, Female Preventive care refers to lifestyle choices and visits with your health care provider that can promote health and wellness. What does preventive care include? A yearly physical exam. This is also called an annual well check. Dental exams once or twice a year. Routine eye exams. Ask your health care provider how often you should have your eyes checked. Personal lifestyle choices, including: Daily care of your teeth and gums. Regular physical activity. Eating a healthy diet. Avoiding tobacco and drug use. Limiting alcohol use. Practicing safe sex. Taking low-dose aspirin every day. Taking vitamin and mineral supplements as recommended by your health care provider. What happens during  an annual well check? The services and screenings done by your health care provider during your annual well check will depend on your age, overall health, lifestyle risk factors, and family history of disease. Counseling  Your health care provider may ask you questions about your: Alcohol use. Tobacco use. Drug use. Emotional well-being. Home and relationship well-being. Sexual activity. Eating habits. History of falls. Memory and ability to understand (cognition). Work and work Statistician. Reproductive health. Screening  You may have the following tests or measurements: Height, weight, and BMI. Blood pressure. Lipid and cholesterol levels. These may be checked every 5 years, or more frequently if you are over 53 years old. Skin check. Lung cancer screening. You may have this screening every year starting at age 10 if you have a 30-pack-year history of smoking and currently smoke or have quit within the past 15 years. Fecal occult blood test (FOBT) of the stool. You may have this test every year starting at age 84. Flexible sigmoidoscopy or colonoscopy. You may have a sigmoidoscopy every 5 years or a colonoscopy every 10 years starting at age 77. Hepatitis C blood test. Hepatitis B blood test. Sexually transmitted disease (STD) testing. Diabetes screening. This is done by checking your blood sugar (glucose) after you have not eaten for a while (fasting). You may have this done every 1-3 years. Bone density scan. This is done to screen for osteoporosis. You may have this done starting at age 33. Mammogram. This may  be done every 1-2 years. Talk to your health care provider about how often you should have regular mammograms. Talk with your health care provider about your test results, treatment options, and if necessary, the need for more tests. Vaccines  Your health care provider may recommend certain vaccines, such as: Influenza vaccine. This is recommended every year. Tetanus,  diphtheria, and acellular pertussis (Tdap, Td) vaccine. You may need a Td booster every 10 years. Zoster vaccine. You may need this after age 68. Pneumococcal 13-valent conjugate (PCV13) vaccine. One dose is recommended after age 77. Pneumococcal polysaccharide (PPSV23) vaccine. One dose is recommended after age 85. Talk to your health care provider about which screenings and vaccines you need and how often you need them. This information is not intended to replace advice given to you by your health care provider. Make sure you discuss any questions you have with your health care provider. Document Released: 10/21/2015 Document Revised: 06/13/2016 Document Reviewed: 07/26/2015 Elsevier Interactive Patient Education  2017 [place] Prevention in the Home Falls can cause injuries. They can happen to people of all ages. There are many things you can do to make your home safe and to help prevent falls. What can I do on the outside of my home? Regularly fix the edges of walkways and driveways and fix any cracks. Remove anything that might make you trip as you walk through a door, such as a raised step or threshold. Trim any bushes or trees on the path to your home. Use bright outdoor lighting. Clear any walking paths of anything that might make someone trip, such as rocks or tools. Regularly check to see if handrails are loose or broken. Make sure that both sides of any steps have handrails. Any raised decks and porches should have guardrails on the edges. Have any leaves, snow, or ice cleared regularly. Use sand or salt on walking paths during winter. Clean up any spills in your garage right away. This includes oil or grease spills. What can I do in the bathroom? Use night lights. Install grab bars by the toilet and in the tub and shower. Do not use towel bars as grab bars. Use non-skid mats or decals in the tub or shower. If you need to sit down in the shower, use a plastic,  non-slip stool. Keep the floor dry. Clean up any water that spills on the floor as soon as it happens. Remove soap buildup in the tub or shower regularly. Attach bath mats securely with double-sided non-slip rug tape. Do not have throw rugs and other things on the floor that can make you trip. What can I do in the bedroom? Use night lights. Make sure that you have a light by your bed that is easy to reach. Do not use any sheets or blankets that are too big for your bed. They should not hang down onto the floor. Have a firm chair that has side arms. You can use this for support while you get dressed. Do not have throw rugs and other things on the floor that can make you trip. What can I do in the kitchen? Clean up any spills right away. Avoid walking on wet floors. Keep items that you use a lot in easy-to-reach places. If you need to reach something above you, use a strong step stool that has a grab bar. Keep electrical cords out of the way. Do not use floor polish or wax that makes floors slippery. If you must use  wax, use non-skid floor wax. Do not have throw rugs and other things on the floor that can make you trip. What can I do with my stairs? Do not leave any items on the stairs. Make sure that there are handrails on both sides of the stairs and use them. Fix handrails that are broken or loose. Make sure that handrails are as long as the stairways. Check any carpeting to make sure that it is firmly attached to the stairs. Fix any carpet that is loose or worn. Avoid having throw rugs at the top or bottom of the stairs. If you do have throw rugs, attach them to the floor with carpet tape. Make sure that you have a light switch at the top of the stairs and the bottom of the stairs. If you do not have them, ask someone to add them for you. What else can I do to help prevent falls? Wear shoes that: Do not have high heels. Have rubber bottoms. Are comfortable and fit you well. Are closed  at the toe. Do not wear sandals. If you use a stepladder: Make sure that it is fully opened. Do not climb a closed stepladder. Make sure that both sides of the stepladder are locked into place. Ask someone to hold it for you, if possible. Clearly mark and make sure that you can see: Any grab bars or handrails. First and last steps. Where the edge of each step is. Use tools that help you move around (mobility aids) if they are needed. These include: Canes. Walkers. Scooters. Crutches. Turn on the lights when you go into a dark area. Replace any light bulbs as soon as they burn out. Set up your furniture so you have a clear path. Avoid moving your furniture around. If any of your floors are uneven, fix them. If there are any pets around you, be aware of where they are. Review your medicines with your doctor. Some medicines can make you feel dizzy. This can increase your chance of falling. Ask your doctor what other things that you can do to help prevent falls. This information is not intended to replace advice given to you by your health care provider. Make sure you discuss any questions you have with your health care provider. Document Released: 07/21/2009 Document Revised: 03/01/2016 Document Reviewed: 10/29/2014 Elsevier Interactive Patient Education  2017 [employer].

## 2022-10-31 ENCOUNTER — Ambulatory Visit: Payer: Medicare Other | Admitting: Urology

## 2022-11-02 ENCOUNTER — Ambulatory Visit
Admission: RE | Admit: 2022-11-02 | Discharge: 2022-11-02 | Disposition: A | Discharge status: Home / Self Care | Payer: Medicare Other | Source: Ambulatory Visit | Attending: Urology | Admitting: Urology

## 2022-11-02 DIAGNOSIS — N2889 Other specified disorders of kidney and ureter: Secondary | ICD-10-CM | POA: Diagnosis present

## 2022-11-02 MED ORDER — IOHEXOL 300 MG/ML  SOLN
100.0000 mL | Freq: Once | INTRAMUSCULAR | Status: AC | PRN
  Administered 2022-11-02: 80 mL via INTRAVENOUS

## 2022-11-07 ENCOUNTER — Encounter: Payer: Self-pay | Admitting: Urology

## 2022-11-07 ENCOUNTER — Ambulatory Visit (INDEPENDENT_AMBULATORY_CARE_PROVIDER_SITE_OTHER): Payer: Medicare Other | Admitting: Urology

## 2022-11-07 VITALS — BP 183/75 | HR 91 | Ht 62.0 in | Wt 110.0 lb

## 2022-11-07 DIAGNOSIS — I2510 Atherosclerotic heart disease of native coronary artery without angina pectoris: Secondary | ICD-10-CM

## 2022-11-07 DIAGNOSIS — N3941 Urge incontinence: Secondary | ICD-10-CM

## 2022-11-07 DIAGNOSIS — N2889 Other specified disorders of kidney and ureter: Secondary | ICD-10-CM

## 2022-11-07 NOTE — Progress Notes (Signed)
Haze Rushing Plume,acting as a scribe for Hollice Espy, MD.,have documented all relevant documentation on the behalf of Hollice Espy, MD,as directed by  Hollice Espy, MD while in the presence of Hollice Espy, MD.  11/07/2022 8:29 AM   Alexa Pham 09-12-1930 [id number]  Referring provider: Einar Pheasant, MD [address] [place],  [place] [address]  Chief Complaint  Patient presents with   Results    HPI: 89 year-old female with a left renal mass who returns today for a 9 month follow-up with a CT abdomen.   She was initially diagnosed with an incidental renal mass 1 year and 10 months ago on CT scan with interim growth of a lesion. The growth of the lesion is very slow with a slight interval increase over the last 9 months. Surveillance review of the very initial CT scan versus today indicates an average growth rate of ~ 3 mm, which is sufficient.   Today, she reports nocturia and wears Depends nightly with the addition of a large pad. She states that she wakes up every hour to urinate. She took [place] for this but it only helped to alleviate her symptoms for a month. She is interested to trying this medication again.     PMH: Past Medical History:  Diagnosis Date   Actinic keratosis 01/28/2020   R thumb proximal nail fold    Atypical mole 05/13/2018   R distal lat thigh   Atypical mole 09/14/2014   left ant mid thigh   Atypical mole 01/19/2014   left paraspinal mid to upper back   Atypical mole 01/19/2014   right back mid to inf scapula   Atypical mole 01/19/2014   left posterior deltoid medial   Basal cell carcinoma 01/28/2020   left of midline mid dorsum nose   Basal cell carcinoma 01/06/2018   left preauricular    Basal cell carcinoma 11/22/2016   left upper lip   Basal cell carcinoma 03/17/2015   left lateral elbow   Basal cell carcinoma 07/22/2014   R medial inferior knee   Basal cell carcinoma 07/21/2020   Left dorsum hand.  Nodular pattern. Excised 08/09/2020, margins free.   Basal cell carcinoma 02/23/2021   right dorsal hand (BCC/SCC), left dorsal hand, right chin - treated with EDC   Family history of breast cancer    History of breast cancer    History of pneumonia    Hypertension    Hypothyroidism    Macular degeneration    Squamous cell carcinoma of skin 01/06/2018   right dorsum hand at ring finger MCP   Squamous cell carcinoma of skin 07/08/2017   left distal dorsum forearm     Surgical History: Past Surgical History:  Procedure Laterality Date   ABDOMINAL HYSTERECTOMY     ABDOMINAL HYSTERECTOMY     APPENDECTOMY     BREAST LUMPECTOMY     CATARACT EXTRACTION, BILATERAL     CHOLECYSTECTOMY     CORONARY STENT INTERVENTION N/A 07/26/2020   Procedure: CORONARY STENT INTERVENTION;  Surgeon: Isaias Cowman, MD;  Location: [facility];  Service: Cardiovascular;  Laterality: N/A;   LEFT HEART CATH AND CORONARY ANGIOGRAPHY Left 07/26/2020   Procedure: LEFT HEART CATH AND CORONARY ANGIOGRAPHY;  Surgeon: Corey Skains, MD;  Location: [facility];  Service: Cardiovascular;  Laterality: Left;   REPLACEMENT TOTAL KNEE     TONSILLECTOMY AND ADENOIDECTOMY      Home Medications:  Allergies as of 11/07/2022  Reactions   Ticagrelor Shortness Of Breath   Extreme         Medication List        Accurate as of November 07, 2022 11:59 PM. If you have any questions, ask your nurse or doctor.          albuterol 108 (90 Base) MCG/ACT inhaler Commonly known as: VENTOLIN HFA Inhale 2 puffs into the lungs every 6 (six) hours as needed for wheezing or shortness of breath.   amLODipine 10 MG tablet Commonly known as: NORVASC TAKE 1 TABLET(10 MG) BY MOUTH DAILY   aspirin EC 81 MG tablet Take 1 tablet (81 mg total) by mouth daily. Swallow whole.   atorvastatin 40 MG tablet Commonly known as: LIPITOR TAKE 1 TABLET(40 MG) BY MOUTH DAILY   calcium citrate-vitamin D  315-200 MG-UNIT tablet Commonly known as: CITRACAL+D Take 1 tablet by mouth 2 (two) times daily.   carvedilol 6.25 MG tablet Commonly known as: COREG Take 6.25 mg by mouth 2 (two) times daily with a meal.   clopidogrel 75 MG tablet Commonly known as: PLAVIX Take 1 tablet (75 mg total) by mouth daily.   Gemtesa 75 MG Tabs Generic drug: Vibegron Take 75 mg by mouth daily.   hydrochlorothiazide 12.5 MG tablet Commonly known as: HYDRODIURIL Take by mouth.   ibandronate 150 MG tablet Commonly known as: Boniva Take 1 tablet (150 mg total) by mouth every 30 (thirty) days. Take in the morning with a full glass of water, on an empty stomach, and do not take anything else by mouth or lie down for the next 30 min.   irbesartan 150 MG tablet Commonly known as: AVAPRO Take 1 tablet (150 mg total) by mouth 2 (two) times daily.   irbesartan 150 MG tablet Commonly known as: AVAPRO Take 150 mg by mouth daily.   levothyroxine 150 MCG tablet Commonly known as: SYNTHROID Take 1 tablet (150 mcg total) by mouth daily.   multivitamin capsule Take 1 capsule by mouth daily.   PRESERVISION AREDS 2 PO Take 1 tablet by mouth 2 (two) times daily.        Allergies:  Allergies  Allergen Reactions   Ticagrelor Shortness Of Breath    Extreme     Family History: Family History  Problem Relation Age of Onset   CAD Mother    Cancer Maternal Aunt        unk type   Leukemia Maternal Grandmother    Breast cancer Half-Sister        dx 66s    Social History:  reports that she quit smoking about 49 years ago. Her smoking use included cigarettes. She has a 9.00 pack-year smoking history. She has never used smokeless tobacco. She reports that she does not drink alcohol and does not use drugs.   Physical Exam: BP (!) 183/75   Pulse 91   Ht '5\' 2"'$  (1.575 m)   Wt 110 lb (49.9 kg)   BMI 20.12 kg/m   Constitutional:  Alert and oriented, No acute distress. HEENT: [place] AT, moist mucus membranes.   Trachea midline, no masses. Neurologic: Grossly intact, no focal deficits, moving all 4 extremities. Psychiatric: Normal mood and affect.  Imaging: CLINICAL DATA:  Left renal mass.  Follow-up. * Tracking Code: BO * CT ABDOMEN WITH CONTRAST   TECHNIQUE: Multidetector CT imaging of the abdomen was performed using the standard protocol following bolus administration of intravenous contrast.   RADIATION DOSE REDUCTION: This exam was performed according to  the departmental dose-optimization program which includes automated exposure control, adjustment of the mA and/or kV according to patient size and/or use of iterative reconstruction technique.   CONTRAST:  42m OMNIPAQUE IOHEXOL 300 MG/ML  SOLN   COMPARISON:  Ultrasound 06/29/2022 and CT of 01/12/2022   FINDINGS: Lower chest: Right middle lobe volume loss and presumably postinfectious/inflammatory scarring with bronchiectasis. Cardiomegaly, without pericardial or pleural effusion. Lad coronary artery calcification.   Hepatobiliary: Normal liver. Cholecystectomy, without biliary ductal dilatation.   Pancreas: Normal, without mass or ductal dilatation.   Spleen: Normal in size, without focal abnormality.   Adrenals/Urinary Tract: Mild left adrenal thickening. Normal right adrenal gland and right kidney. Left renal too small to characterize lesions.   Exophytic off the posterolateral interpolar left kidney is a heterogeneous solid mass which measures 3.7 x 3.2 cm on 21/7. Compare 3.3 x 3.1 cm on the prior exam when remeasured in a similar fashion. 4.2 cm craniocaudal on coronal image 49/5 versus 3.8 cm on the prior exam when remeasured in a similar fashion.   No hydronephrosis.   Stomach/Bowel: Proximal gastric underdistention. Normal abdominal bowel loops.   Vascular/Lymphatic: Aortic atherosclerosis. Patent renal veins. Left periaortic index node is similar at 1.0 cm in 30/2.   Other: No ascites. No free  intraperitoneal air. Anterior abdominal wall subcutaneous nodularity is nonspecific but may be iatrogenic.   Musculoskeletal: Disc bulges throughout the lumbar spine.   IMPRESSION: 1. Since 01/12/2022, mild enlargement of a 4.2 cm left renal cell carcinoma. 2. No renal vein involvement. 3. Similar borderline abdominal retroperitoneal adenopathy. 4. Incidental findings, including: Coronary artery atherosclerosis. Aortic Atherosclerosis (ICD10-I70.0).    Electronically Signed   By: KAbigail MiyamotoM.D.   On: 11/05/2022 11:16  The imaging was personally reviewed and I agree with the radiologic interpretation.  Assessment & Plan:    1. Left renal mass The left renal mass is in the region somewhat, overall growth rate is fairly minimal dating back to 22 months ago when first identified, on average 3 mm annually  Given her age and comorbidities, continue to recommend active surveillance.  She is otherwise asymptomatic and is agreeable this plan.  She understands the risks.  Will plan to repeat imaging in a year. - CT Abd Wo & W Cm; Future  2. Urge incontinence Worsening urge incontinence, previously did well with Gemtesa for about a month and then felt that it quit working.  She was given samples again today for another month, Gemtesa 75 mg and let u[place]ore[place]now if would like to fill this prescription. - CT Abd Wo & W Cm; Future    [facility] [address] [place] [phone]

## 2022-11-08 ENCOUNTER — Telehealth: Payer: Self-pay | Admitting: Internal Medicine

## 2022-11-08 ENCOUNTER — Encounter: Payer: Self-pay | Admitting: Internal Medicine

## 2022-11-08 DIAGNOSIS — N3941 Urge incontinence: Secondary | ICD-10-CM | POA: Insufficient documentation

## 2022-11-08 DIAGNOSIS — N289 Disorder of kidney and ureter, unspecified: Secondary | ICD-10-CM

## 2022-11-08 NOTE — Telephone Encounter (Signed)
Patient has a lab appt 11/14/2022, there are No orders in.

## 2022-11-08 NOTE — Addendum Note (Signed)
Addended by: Roetta Sessions D on: 11/08/2022 02:01 PM   Modules accepted: Orders

## 2022-11-08 NOTE — Telephone Encounter (Signed)
BMP ordered

## 2022-11-14 ENCOUNTER — Other Ambulatory Visit (INDEPENDENT_AMBULATORY_CARE_PROVIDER_SITE_OTHER): Payer: Medicare Other

## 2022-11-14 ENCOUNTER — Other Ambulatory Visit: Payer: Medicare Other

## 2022-11-14 DIAGNOSIS — N289 Disorder of kidney and ureter, unspecified: Secondary | ICD-10-CM | POA: Diagnosis not present

## 2022-11-15 ENCOUNTER — Encounter: Payer: Self-pay | Admitting: Pulmonary Disease

## 2022-11-15 ENCOUNTER — Ambulatory Visit (INDEPENDENT_AMBULATORY_CARE_PROVIDER_SITE_OTHER): Payer: Medicare Other | Admitting: Pulmonary Disease

## 2022-11-15 VITALS — BP 126/72 | HR 68 | Temp 97.7°F | Ht 62.0 in | Wt 119.6 lb

## 2022-11-15 DIAGNOSIS — I214 Non-ST elevation (NSTEMI) myocardial infarction: Secondary | ICD-10-CM | POA: Diagnosis not present

## 2022-11-15 DIAGNOSIS — R0600 Dyspnea, unspecified: Secondary | ICD-10-CM | POA: Diagnosis not present

## 2022-11-15 DIAGNOSIS — J479 Bronchiectasis, uncomplicated: Secondary | ICD-10-CM | POA: Diagnosis not present

## 2022-11-15 DIAGNOSIS — I272 Pulmonary hypertension, unspecified: Secondary | ICD-10-CM

## 2022-11-15 DIAGNOSIS — N2889 Other specified disorders of kidney and ureter: Secondary | ICD-10-CM

## 2022-11-15 LAB — BASIC METABOLIC PANEL
BUN: 18 mg/dL (ref 6–23)
CO2: 35 mEq/L — ABNORMAL HIGH (ref 19–32)
Calcium: 8.5 mg/dL (ref 8.4–10.5)
Chloride: 95 mEq/L — ABNORMAL LOW (ref 96–112)
Creatinine, Ser: 0.92 mg/dL (ref 0.40–1.20)
GFR: 54.6 mL/min — ABNORMAL LOW (ref >60.00)
Glucose, Bld: 99 mg/dL (ref 70–99)
Potassium: 3.8 mEq/L (ref 3.5–5.1)
Sodium: 139 mEq/L (ref 135–145)

## 2022-11-15 NOTE — Progress Notes (Signed)
Subjective:    [name], female    DOB: [birth year], 89 y.o.   MRN: [id number] Patient Care Team: Einar Pheasant, MD as PCP - General (Internal Medicine) Tyler Pita, MD as Consulting Physician (Pulmonary Disease) Jettie Pagan, MD as Referring Physician (Cardiology)  Chief Complaint  Patient presents with   Follow-up    DOE for years. Has got worse in the last 6 month. No wheezing. No cough.     HPI Alexa Pham is a 89 year old very remote/light smoker, who presents for evaluation of shortness of breath that has gotten worse over the last 6 months.  She is kindly referred by Dr. Einar Pheasant.  We had last seen Jamillah on 08 August 2020 to follow-up on findings on her CT that were consistent with bronchiectasis.. Recall that she was initially seen on January 2020 at time she had had an issue with pneumonia and films were also positive for bronchiectasis and tree-in-bud type peribronchial nodular opacities.  This is a pattern seen with MAC.  Subsequently we saw her again in June 2020, her CT at that time had improved.  She remained asymptomatic from the MAC standpoint.  She was supposed to see [place] in January 2021 however canceled that appointment and then eventually was seen in November 2021 as noted above.  At that time she was complaining of shortness of breath that had been worse since her cardiac catheterization in late October 2021 she had been placed on Brillinta.  It appeared that Brillinta was worsening symptoms of dyspnea this medication was switched and the patient actually did better after that.  Actually resolved and therefore she never followed up with [place]. She has had orthopnea with paroxysmal nocturnal dyspnea on several occasions for the last month.  She notices increasing fatigue and dyspnea on exertion. No wheezing.  No sputum production no hemoptysis.  No fevers, chills or sweats.  She coughs only when she lays flat.  She has not had any gastroesophageal  reflux symptoms.  Previously she was being followed at [facility] for her cardiac issues since then she has switched her care to the cardiology division at [facility].  She is followed by Dr. Tomma Rakers.  Dr. Sheppard Coil has switched her metoprolol to Coreg which improved Ms. Hagner's dyspnea some.  She had an echocardiogram performed on 16 August 2023 at her last visit with Dr. Sheppard Coil this showed: LVEF of over 55%.  Main PA was dilated right ventricle mildly enlarged there was abnormal respiratory collapse of the inferior vena cava.  There was mild AR, no AAS trivial MR, mild TR.  Estimated RVSP was 50 mmHg.   She presented today with her son (DPOA).   Review of Systems A 10 point review of systems was performed and it is as noted above otherwise negative.  Past Medical History:  Diagnosis Date   Actinic keratosis 01/28/2020   R thumb proximal nail fold    Atypical mole 05/13/2018   R distal lat thigh   Atypical mole 09/14/2014   left ant mid thigh   Atypical mole 01/19/2014   left paraspinal mid to upper back   Atypical mole 01/19/2014   right back mid to inf scapula   Atypical mole 01/19/2014   left posterior deltoid medial   Basal cell carcinoma 01/28/2020   left of midline mid dorsum nose   Basal cell carcinoma 01/06/2018   left preauricular    Basal cell carcinoma 11/22/2016   left upper lip   Basal  cell carcinoma 03/17/2015   left lateral elbow   Basal cell carcinoma 07/22/2014   R medial inferior knee   Basal cell carcinoma 07/21/2020   Left dorsum hand. Nodular pattern. Excised 08/09/2020, margins free.   Basal cell carcinoma 02/23/2021   right dorsal hand (BCC/SCC), left dorsal hand, right chin - treated with EDC   Family history of breast cancer    History of breast cancer    History of pneumonia    Hypertension    Hypothyroidism    Macular degeneration    Squamous cell carcinoma of skin 01/06/2018   right dorsum hand at ring finger MCP   Squamous cell  carcinoma of skin 07/08/2017   left distal dorsum forearm    Past Surgical History:  Procedure Laterality Date   ABDOMINAL HYSTERECTOMY     ABDOMINAL HYSTERECTOMY     APPENDECTOMY     BREAST LUMPECTOMY     CATARACT EXTRACTION, BILATERAL     CHOLECYSTECTOMY     CORONARY STENT INTERVENTION N/A 07/26/2020   Procedure: CORONARY STENT INTERVENTION;  Surgeon: Isaias Cowman, MD;  Location: [facility];  Service: Cardiovascular;  Laterality: N/A;   LEFT HEART CATH AND CORONARY ANGIOGRAPHY Left 07/26/2020   Procedure: LEFT HEART CATH AND CORONARY ANGIOGRAPHY;  Surgeon: Corey Skains, MD;  Location: [facility];  Service: Cardiovascular;  Laterality: Left;   REPLACEMENT TOTAL KNEE     TONSILLECTOMY AND ADENOIDECTOMY     Patient Active Problem List   Diagnosis Date Noted   Urge incontinence 11/08/2022   Shortness of breath 10/04/2022   Cough 06/24/2022   Fatigue 06/03/2022   Nocturia 06/03/2022   Premature beats 05/31/2022   Osteopenia 02/18/2022   NSTEMI (non-ST elevated myocardial infarction) ([place]) 02/13/2022   Basal cell carcinoma 02/23/2021   Genetic testing 08/31/2020   Family history of breast cancer    Kidney mass 08/08/2020   CAD (coronary artery disease) 07/26/2020   Angina pectoris ([place]) 07/04/2020   Abnormal liver function test 06/15/2020   Dizziness 05/04/2020   Elevated troponin 05/04/2020   Hypomagnesemia 05/04/2020   Orthostatic hypotension 05/04/2020   Shingles 01/10/2020   Weight loss 05/10/2019   Stress 04/19/2019   Aortic atherosclerosis ([place]) 11/20/2018   Abnormal CT lung screening 11/07/2018   Bronchiectasis without complication ([place]) [id number]   Abnormal chest CT 10/19/2018   Health care maintenance 07/06/2018   History of breast cancer 12/01/2017   Macular degeneration 12/01/2017   Hypertension 11/28/2017   Hypothyroidism 11/28/2017   Hypercholesterolemia 11/28/2017   Family History  Problem Relation Age of Onset   CAD  Mother    Cancer Maternal Aunt        unk type   Leukemia Maternal Grandmother    Breast cancer Half-Sister        dx 73s   Social History   Tobacco Use   Smoking status: Former    Packs/day: 1.50    Years: 6.00    Total pack years: 9.00    Types: Cigarettes    Quit date: 1975    Years since quitting: 49.1   Smokeless tobacco: Never  Substance Use Topics   Alcohol use: No    Alcohol/week: 0.0 standard drinks of alcohol   Allergies  Allergen Reactions   Ticagrelor Shortness Of Breath    Extreme    Current Meds  Medication Sig   albuterol (VENTOLIN HFA) 108 (90 Base) MCG/ACT inhaler Inhale 2 puffs into the lungs every 6 (six) hours as needed  for wheezing or shortness of breath.   amLODipine (NORVASC) 10 MG tablet TAKE 1 TABLET(10 MG) BY MOUTH DAILY   aspirin EC 81 MG EC tablet Take 1 tablet (81 mg total) by mouth daily. Swallow whole.   atorvastatin (LIPITOR) 40 MG tablet TAKE 1 TABLET(40 MG) BY MOUTH DAILY   calcium citrate-vitamin D (CITRACAL+D) 315-200 MG-UNIT tablet Take 1 tablet by mouth 2 (two) times daily.   carvedilol (COREG) 6.25 MG tablet Take 6.25 mg by mouth 2 (two) times daily with a meal.   clopidogrel (PLAVIX) 75 MG tablet Take 1 tablet (75 mg total) by mouth daily.   ibandronate (BONIVA) 150 MG tablet Take 1 tablet (150 mg total) by mouth every 30 (thirty) days. Take in the morning with a full glass of water, on an empty stomach, and do not take anything else by mouth or lie down for the next 30 min.   irbesartan (AVAPRO) 150 MG tablet Take 150 mg by mouth daily.   levothyroxine (SYNTHROID) 137 MCG tablet Take 137 mcg by mouth daily.   Multiple Vitamin (MULTIVITAMIN) capsule Take 1 capsule by mouth daily.   Multiple Vitamins-Minerals (PRESERVISION AREDS 2 PO) Take 1 tablet by mouth 2 (two) times daily.    Vibegron (GEMTESA) 75 MG TABS Take 75 mg by mouth daily.   Immunization History  Administered Date(s) Administered   Fluad Quad(high Dose 65+) 06/14/2019,  06/10/2020, 09/21/2021   Influenza, High Dose Seasonal PF 07/04/2015, 07/02/2017, 07/03/2018   Influenza-Unspecified 06/22/2016, 07/08/2022   PFIZER Comirnaty(Gray Top)Covid-19 Tri-Sucrose Vaccine 11/06/2019, 11/27/2019, 07/11/2020   PFIZER(Purple Top)SARS-COV-2 Vaccination 11/06/2019, 11/27/2019   Pneumococcal Conjugate-13 04/21/2016   Pneumococcal Polysaccharide-23 05/05/2012   Tdap 11/09/2014       Objective:   Physical Exam BP 126/72 (BP Location: Left Arm, Cuff Size: Normal)   Pulse 68   Temp 97.7 F (36.5 C)   Ht 5' 2"$  (1.575 m)   Wt 119 lb 9.6 oz (54.3 kg)   SpO2 97%   BMI 21.88 kg/m   SpO2: 97 % O2 Device: None (Room air)  GENERAL: Slender, well-developed elderly woman in no acute respiratory distress.  Fully ambulatory. HEAD: Normocephalic, atraumatic.  EYES: Pupils equal, round, reactive to light.  No scleral icterus.  MOUTH: Dentition intact, some chipped teeth, oral mucosa moist.  No thrush. NECK: Supple. No thyromegaly. Trachea midline. No JVD.  No adenopathy. PULMONARY: Good air entry bilaterally.  Coarse, otherwise no adventitious sounds. CARDIOVASCULAR: S1 and S2. Regular rate and rhythm patient with occasional extrasystoles noted.  No rubs, murmurs or gallops heard. ABDOMEN: Benign. MUSCULOSKELETAL: No joint deformity, no clubbing, no edema.  NEUROLOGIC: No overt focal deficit.  No gait disturbance.  Speech is fluent. SKIN: Intact,warm,dry. PSYCH: Mood and behavior normal.  Ambulatory oximetry was performed today: At rest on room air heart rate was 68 bpm, O2 sat 96%, patient ambulated 550 feet oxygen saturations remained at 95% throughout.  Maximum BPM 80     Assessment & Plan:     ICD-10-CM   1. Dyspnea, unspecified type  R06.00 Pulmonary Function Test [facility] Only    Pulse oximetry, overnight   Will obtain PFTs Check overnight oximetry No evidence of oxygen desaturations with ambulation Concern?  PAH May need right heart cath    2. Bronchiectasis  without complication (HCC)  [id number] Pulmonary Function Test [facility] Only    Pulse oximetry, overnight   No change in character of cough PFTs ordered    3. Pulmonary hypertension (HCC) - R/O  I27.20 Pulmonary  Function Test [facility] Only    Pulse oximetry, overnight   May need right heart cath to exclude completely Echocardiographic estimation of RVSP may underestimate    4. NSTEMI (non-ST elevated myocardial infarction) ([place])  I21.4 Pulse oximetry, overnight   Significant CAD This issue adds complexity to her management Follows with cardiology    5. Left renal mass  N28.89    On observational protocol Follows with urology     Orders Placed This Encounter  Procedures   Pulmonary Function Test [facility] Only    Standing Status:   Future    Standing Expiration Date:   05/16/2023    Order Specific Question:   Full PFT: includes the following: basic spirometry, spirometry pre & post bronchodilator, diffusion capacity (DLCO), lung volumes    Answer:   Full PFT    Order Specific Question:   This test can only be performed at    Answer:   [facility]   Pulse oximetry, overnight    [facility]    Standing Status:   Future    Standing Expiration Date:   11/16/2023   We will see the patient in follow-up in 4 to 6 weeks time she is to contact us prior to that time should any new difficulties arise.  Renold Don, MD Advanced Bronchoscopy [facility]    *This note was dictated using voice recognition software/Dragon.  Despite best efforts to proofread, errors can occur which can change the meaning. Any transcriptional errors that result from this process are unintentional and may not be fully corrected at the time of dictation.

## 2022-11-15 NOTE — Patient Instructions (Signed)
We are arranging for breathing test.  We have also sent a request for oxygen determination while you sleep.  I think your problems are due to a condition called pulmonary hypertension.  It may be worthwhile to get a right side heart catheterization but we will await these other studies and determine.  This would have to be done by Dr. Sheppard Coil or her colleagues at [facility].  We will see you in follow-up in 4 to 6 weeks time call sooner should any new problems arise.

## 2022-11-19 ENCOUNTER — Encounter: Payer: Self-pay | Admitting: Pulmonary Disease

## 2022-11-30 ENCOUNTER — Encounter (INDEPENDENT_AMBULATORY_CARE_PROVIDER_SITE_OTHER): Payer: Medicare Other | Admitting: Ophthalmology

## 2022-11-30 DIAGNOSIS — H35372 Puckering of macula, left eye: Secondary | ICD-10-CM | POA: Diagnosis not present

## 2022-11-30 DIAGNOSIS — H353111 Nonexudative age-related macular degeneration, right eye, early dry stage: Secondary | ICD-10-CM | POA: Diagnosis not present

## 2022-11-30 DIAGNOSIS — I10 Essential (primary) hypertension: Secondary | ICD-10-CM

## 2022-11-30 DIAGNOSIS — H35033 Hypertensive retinopathy, bilateral: Secondary | ICD-10-CM

## 2022-11-30 DIAGNOSIS — H353122 Nonexudative age-related macular degeneration, left eye, intermediate dry stage: Secondary | ICD-10-CM | POA: Diagnosis not present

## 2022-11-30 DIAGNOSIS — H43813 Vitreous degeneration, bilateral: Secondary | ICD-10-CM

## 2022-12-13 ENCOUNTER — Telehealth: Payer: Self-pay | Admitting: Internal Medicine

## 2022-12-13 NOTE — Telephone Encounter (Signed)
Prescription Request  12/13/2022  LOV: 10/11/2022  What is the name of the medication or equipment? irbesartan (AVAPRO) 150 MG tablet  Have you contacted your pharmacy to request a refill? No   Which pharmacy would you like this sent to?  [id number] - [place], [place] - [address] [place] [address] Phone: [phone] Fax: [phone]    Patient notified that their request is being sent to the clinical staff for review and that they should receive a response within 2 business days.   Please advise at [facility] [phone]

## 2022-12-14 ENCOUNTER — Telehealth: Payer: Self-pay

## 2022-12-14 DIAGNOSIS — J479 Bronchiectasis, uncomplicated: Secondary | ICD-10-CM

## 2022-12-14 MED ORDER — IRBESARTAN 150 MG PO TABS: 150.0000 mg | ORAL_TABLET | Freq: Every day | ORAL | 11 refills | Status: DC

## 2022-12-14 NOTE — Telephone Encounter (Signed)
Per PCP 10/11/2022 note to continue Irbesartan 150 mg script sent to pharmacy

## 2022-12-14 NOTE — Addendum Note (Signed)
Addended by: Nanci Pina on: 12/14/2022 03:17 PM   Modules accepted: Orders

## 2022-12-14 NOTE — Telephone Encounter (Signed)
ONO reviewed by Dr. Patsey Berthold- recommend 2L QHS.   Patient is aware of results and voiced her understanding. Order placed.  Nothing further needed.

## 2022-12-20 ENCOUNTER — Ambulatory Visit: Payer: Medicare Other | Attending: Pulmonary Disease

## 2022-12-20 DIAGNOSIS — I272 Pulmonary hypertension, unspecified: Secondary | ICD-10-CM | POA: Diagnosis not present

## 2022-12-20 DIAGNOSIS — J479 Bronchiectasis, uncomplicated: Secondary | ICD-10-CM

## 2022-12-20 DIAGNOSIS — Z87891 Personal history of nicotine dependence: Secondary | ICD-10-CM | POA: Diagnosis not present

## 2022-12-20 DIAGNOSIS — R0600 Dyspnea, unspecified: Secondary | ICD-10-CM | POA: Diagnosis present

## 2022-12-20 LAB — PULMONARY FUNCTION TEST ARMC ONLY
DL/VA: 3.92 ml/min/mmHg/L
DLCO unc: 9.85 ml/min/mmHg
FEF 25-75 Post: 1.34 L/sec
FEF 25-75 Pre: 0.67 L/sec
FEF2575-%Change-Post: 98 %
FEF2575-%Pred-Post: 151 %
FEF2575-%Pred-Pre: 76 %
FEV1-%Change-Post: 32 %
FEV1-%Pred-Post: 71 %
FEV1-%Pred-Pre: 53 %
FEV1-Post: 1.17 L
FEV1-Pre: 0.88 L
FEV1FVC-%Change-Post: 23 %
FEV1FVC-%Pred-Pre: 89 %
FEV6-%Change-Post: 11 %
FEV6-%Pred-Post: 70 %
FEV6-%Pred-Pre: 63 %
FEV6-Post: 1.47 L
FEV6-Pre: 1.31 L
FEV6FVC-%Pred-Post: 107 %
FEV6FVC-%Pred-Pre: 107 %
FVC-%Change-Post: 7 %
FVC-%Pred-Post: 66 %
FVC-%Pred-Pre: 61 %
FVC-Post: 1.47 L
FVC-Pre: 1.37 L
Post FEV1/FVC ratio: 79 %
Post FEV6/FVC ratio: 100 %
Pre FEV1/FVC ratio: 64 %
Pre FEV6/FVC Ratio: 100 %
RV % pred: 84 %
RV: 2.25 L
TLC % pred: 70 %
TLC: 3.67 L

## 2022-12-20 MED ORDER — ALBUTEROL SULFATE (2.5 MG/3ML) 0.083% IN NEBU
2.5000 mg | INHALATION_SOLUTION | Freq: Once | RESPIRATORY_TRACT | Status: AC
  Administered 2022-12-20: 2.5 mg via RESPIRATORY_TRACT

## 2022-12-21 ENCOUNTER — Ambulatory Visit: Payer: Medicare Other | Admitting: Internal Medicine

## 2022-12-25 ENCOUNTER — Encounter: Payer: Self-pay | Admitting: Pulmonary Disease

## 2022-12-25 ENCOUNTER — Telehealth: Payer: Self-pay | Admitting: Pulmonary Disease

## 2022-12-25 ENCOUNTER — Ambulatory Visit (INDEPENDENT_AMBULATORY_CARE_PROVIDER_SITE_OTHER): Payer: Medicare Other | Admitting: Pulmonary Disease

## 2022-12-25 VITALS — BP 134/80 | HR 67 | Temp 97.5°F | Ht 62.0 in | Wt 116.4 lb

## 2022-12-25 DIAGNOSIS — J4530 Mild persistent asthma, uncomplicated: Secondary | ICD-10-CM

## 2022-12-25 DIAGNOSIS — J45909 Unspecified asthma, uncomplicated: Secondary | ICD-10-CM

## 2022-12-25 DIAGNOSIS — R0600 Dyspnea, unspecified: Secondary | ICD-10-CM | POA: Diagnosis not present

## 2022-12-25 DIAGNOSIS — J479 Bronchiectasis, uncomplicated: Secondary | ICD-10-CM

## 2022-12-25 DIAGNOSIS — R0902 Hypoxemia: Secondary | ICD-10-CM | POA: Diagnosis not present

## 2022-12-25 MED ORDER — TRELEGY ELLIPTA 100-62.5-25 MCG/ACT IN AEPB: 1.0000 | INHALATION_SPRAY | Freq: Every day | RESPIRATORY_TRACT | 0 refills | Status: DC

## 2022-12-25 NOTE — Telephone Encounter (Addendum)
I spoke [employer]. They do not need an order because she is not needing a POC. The patient needs to call back and tell them she needs travel oxygen.  She will have to answer multiple questions regarding her trip and where she would like the O2 delivered to.  I left a message for the patient to return my call.

## 2022-12-25 NOTE — Patient Instructions (Signed)
Your breathing test that shows some evidence of asthma.  We are giving you a trial of an inhaler called Trelegy, this is 1 puff daily, make sure you rinse your mouth well after you use it.  Let us know how you are doing with the inhaler.  We will see you in follow-up in 2 to 3 months time call sooner should any new problems arise.

## 2022-12-25 NOTE — Telephone Encounter (Signed)
Pt is asking for a POC RX to be faxed to [phone]. She states she needs it for a trip she is taking at the end of April

## 2022-12-25 NOTE — Progress Notes (Signed)
Subjective:    [name], female    DOB: October 15, 1929, 89 y.o.   MRN: [id number] Patient Care Team: Einar Pheasant, MD as PCP - General (Internal Medicine) Tyler Pita, MD as Consulting Physician (Pulmonary Disease) Jettie Pagan, MD as Referring Physician (Cardiology)  Chief Complaint  Patient presents with   Follow-up    Breathing is the same. SOB with exertion. No wheezing or cough.     HPI Alexa Pham is a 89 year old very remote/light smoker, who presents for follow-up of shortness of breath that has gotten worse over the last 6 months.  We last saw Alexa Pham on 15 November 2022 after a hiatus of 3 years.  Recall that she was initially seen on January 2020 at time she had had an issue with pneumonia and films were consistent with bronchiectasis and tree-in-bud type peribronchial nodular opacities. This is a pattern seen with MAC.  Subsequently we saw her again in June 2020, her CT at that time had improved.  She remained asymptomatic from the MAC standpoint.  She was supposed to see [place] in January 2021 however canceled that appointment and then eventually was seen in November 2021.  At that time she was complaining of shortness of breath that had been worse since her cardiac catheterization in late October 2021 she had been placed on Brillinta.  It appeared that Brillinta was worsening symptoms of dyspnea this medication was switched and the patient actually did better after that.  Because her dyspnea resolved she never followed up with [place] after that.  At her most recent visit in February, she complained of orthopnea with paroxysmal nocturnal dyspnea on several occasions during the month prior.  She had noted increasing fatigue and dyspnea on exertion. No wheezing.  No sputum production no hemoptysis.  No fevers, chills or sweats.  She coughs only when she lays flat.  She has not had any gastroesophageal reflux symptoms.  On today's visit she notes that she continues to have  shortness of breath with exertion, she has no wheezing or cough.  Previously she had qualified for home oxygen and she has been compliant with this and notes that this helps her get restful sleep.  She is on 2 L/min nocturnally.   Previously she was being followed at [facility] for her cardiac issues since then she has switched her care to the cardiology division at [facility].  She is followed by Dr. Tomma Rakers.  Dr. Sheppard Coil has switched her metoprolol to Coreg which improved Ms. [name]'s dyspnea some.   She had an echocardiogram performed on 16 August 2023 at her last visit with Dr. Sheppard Coil this showed: LVEF of over 55%.  Main PA was dilated right ventricle mildly enlarged there was abnormal respiratory collapse of the inferior vena cava.  There was mild AR, no AAS trivial MR, mild TR.  Estimated RVSP was 50 mmHg.  She had pulmonary function testing performed in March 2024 this shows an FEV1 of 0.88 L or 53% predicted, FVC of 1.37 L or 61% predicted, there is a very significant response to bronchodilators with an improvement of 32% on FEV1 postbronchodilator.  There is very minimal restriction noted.  Findings are consistent mostly with mild obstructive airways disease with reversible component as seen in asthma.  Discussed the findings of the PFTs with the patient and her son (DPOA) who is with her today.  She has not used any bronchodilators, she states that she would know how to use an inhaler.  Review of Systems A 10 point review of systems was performed and it is as noted above otherwise negative.  Patient Active Problem List   Diagnosis Date Noted   Urge incontinence 11/08/2022   Shortness of breath 10/04/2022   Cough 06/24/2022   Fatigue 06/03/2022   Nocturia 06/03/2022   Premature beats 05/31/2022   Osteopenia 02/18/2022   NSTEMI (non-ST elevated myocardial infarction) ([place]) 02/13/2022   Basal cell carcinoma 02/23/2021   Genetic testing 08/31/2020   Family history of  breast cancer    Kidney mass 08/08/2020   CAD (coronary artery disease) 07/26/2020   Angina pectoris ([place]) 07/04/2020   Abnormal liver function test 06/15/2020   Dizziness 05/04/2020   Elevated troponin 05/04/2020   Hypomagnesemia 05/04/2020   Orthostatic hypotension 05/04/2020   Shingles 01/10/2020   Weight loss 05/10/2019   Stress 04/19/2019   Aortic atherosclerosis ([place]) 11/20/2018   Abnormal CT lung screening 11/07/2018   Bronchiectasis without complication ([place]) [id number]   Abnormal chest CT 10/19/2018   Health care maintenance 07/06/2018   History of breast cancer 12/01/2017   Macular degeneration 12/01/2017   Hypertension 11/28/2017   Hypothyroidism 11/28/2017   Hypercholesterolemia 11/28/2017   Social History   Tobacco Use   Smoking status: Former    Packs/day: 1.50    Years: 6.00    Additional pack years: 0.00    Total pack years: 9.00    Types: Cigarettes    Quit date: 1975    Years since quitting: 49.2   Smokeless tobacco: Never  Substance Use Topics   Alcohol use: No    Alcohol/week: 0.0 standard drinks of alcohol   Allergies  Allergen Reactions   Ticagrelor Shortness Of Breath    Extreme    Current Meds  Medication Sig   albuterol (VENTOLIN HFA) 108 (90 Base) MCG/ACT inhaler Inhale 2 puffs into the lungs every 6 (six) hours as needed for wheezing or shortness of breath.   amLODipine (NORVASC) 10 MG tablet TAKE 1 TABLET(10 MG) BY MOUTH DAILY   aspirin EC 81 MG EC tablet Take 1 tablet (81 mg total) by mouth daily. Swallow whole.   atorvastatin (LIPITOR) 40 MG tablet TAKE 1 TABLET(40 MG) BY MOUTH DAILY   calcium citrate-vitamin D (CITRACAL+D) 315-200 MG-UNIT tablet Take 1 tablet by mouth 2 (two) times daily.   carvedilol (COREG) 6.25 MG tablet Take 6.25 mg by mouth 2 (two) times daily with a meal.   clopidogrel (PLAVIX) 75 MG tablet Take 1 tablet (75 mg total) by mouth daily.   ibandronate (BONIVA) 150 MG tablet Take 1 tablet (150 mg total) by mouth  every 30 (thirty) days. Take in the morning with a full glass of water, on an empty stomach, and do not take anything else by mouth or lie down for the next 30 min.   irbesartan (AVAPRO) 150 MG tablet Take 1 tablet (150 mg total) by mouth daily.   levothyroxine (SYNTHROID) 137 MCG tablet Take 137 mcg by mouth daily.   Multiple Vitamin (MULTIVITAMIN) capsule Take 1 capsule by mouth daily.   Multiple Vitamins-Minerals (PRESERVISION AREDS 2 PO) Take 1 tablet by mouth 2 (two) times daily.    Vibegron (GEMTESA) 75 MG TABS Take 75 mg by mouth daily.   Immunization History  Administered Date(s) Administered   Fluad Quad(high Dose 65+) 06/14/2019, 06/10/2020, 09/21/2021   Influenza, High Dose Seasonal PF 07/04/2015, 07/02/2017, 07/03/2018   Influenza-Unspecified 06/22/2016, 07/08/2022   PFIZER Comirnaty(Gray Top)Covid-19 Tri-Sucrose Vaccine 11/06/2019, 11/27/2019, 07/11/2020   PFIZER(Purple Top)SARS-COV-2  Vaccination 11/06/2019, 11/27/2019   Pneumococcal Conjugate-13 04/21/2016   Pneumococcal Polysaccharide-23 05/05/2012   Tdap 11/09/2014       Objective:   Physical Exam BP 134/80 (BP Location: Left Arm, Cuff Size: Normal)   Pulse 67   Temp (!) 97.5 F (36.4 C)   Ht 5\' 2"  (1.575 m)   Wt 116 lb 6.4 oz (52.8 kg)   SpO2 94%   BMI 21.29 kg/m   SpO2: 94 % O2 Device: None (Room air)  GENERAL: Slender, well-developed elderly woman in no acute respiratory distress.  Fully ambulatory. HEAD: Normocephalic, atraumatic.  EYES: Pupils equal, round, reactive to light.  No scleral icterus.  MOUTH: Dentition intact, some chipped teeth, oral mucosa moist.  No thrush. NECK: Supple. No thyromegaly. Trachea midline. No JVD.  No adenopathy. PULMONARY: Good air entry bilaterally.  Coarse, otherwise no adventitious sounds. CARDIOVASCULAR: S1 and S2. Regular rate and rhythm patient with occasional extrasystoles noted.  No rubs, murmurs or gallops heard. ABDOMEN: Benign. MUSCULOSKELETAL: No joint  deformity, no clubbing, no edema.  NEUROLOGIC: No overt focal deficit.  No gait disturbance.  Speech is fluent. SKIN: Intact,warm,dry. PSYCH: Mood and behavior normal.       Assessment & Plan:     ICD-10-CM   1. Mild persistent asthma without complication  [id number]    Will give the patient a trial of Trelegy Ellipta 100 Patient was instructed on the proper use of the DPI    2. Bronchiectasis without complication (HCC)  A999333    Currently quiescent No recent exacerbation symptoms    3. Dyspnea, unspecified type  R06.00    Hopefully will improve with management of asthma Trial of Trelegy as above    4. Nocturnal hypoxemia due to asthma  R09.02    J45.909    Continue oxygen at 2 L/min     Meds ordered this encounter  Medications   Fluticasone-Umeclidin-Vilant (TRELEGY ELLIPTA) 100-62.5-25 MCG/ACT AEPB    Sig: Inhale 1 puff into the lungs daily.    Dispense:  28 each    Refill:  0    Order Specific Question:   Lot Number?    Answer:   [id number]    Order Specific Question:   Expiration Date?    Answer:   03/08/2024    Order Specific Question:   Quantity    Answer:   2   We have provided the patient with a trial of Trelegy Ellipta 100, 1 inhalation daily.  She is to let us know how this works for her.  We offered also a rescue inhaler however the patient was somewhat overwhelmed and would like to hold off on this for now.  We will reassess on follow-up.  We will see the patient in follow-up in 2 to 3 months time she is to call sooner should any new problems arise.  Renold Don, MD Advanced Bronchoscopy PCCM [facility]    *This note was dictated using voice recognition software/Dragon.  Despite best efforts to proofread, errors can occur which can change the meaning. Any transcriptional errors that result from this process are unintentional and may not be fully corrected at the time of dictation.

## 2022-12-26 NOTE — Telephone Encounter (Signed)
Patient is aware of below message and voiced her understanding. She will contact [employer]. Nothing further needed.

## 2022-12-27 ENCOUNTER — Encounter: Payer: Self-pay | Admitting: Internal Medicine

## 2022-12-27 ENCOUNTER — Ambulatory Visit (INDEPENDENT_AMBULATORY_CARE_PROVIDER_SITE_OTHER): Payer: Medicare Other | Admitting: Internal Medicine

## 2022-12-27 VITALS — BP 136/86 | HR 66 | Temp 97.6°F | Ht 66.0 in | Wt 115.0 lb

## 2022-12-27 DIAGNOSIS — I2510 Atherosclerotic heart disease of native coronary artery without angina pectoris: Secondary | ICD-10-CM | POA: Diagnosis not present

## 2022-12-27 DIAGNOSIS — I700 Atherosclerosis of aorta: Secondary | ICD-10-CM

## 2022-12-27 DIAGNOSIS — R634 Abnormal weight loss: Secondary | ICD-10-CM

## 2022-12-27 DIAGNOSIS — J479 Bronchiectasis, uncomplicated: Secondary | ICD-10-CM | POA: Diagnosis not present

## 2022-12-27 DIAGNOSIS — E7800 Pure hypercholesterolemia, unspecified: Secondary | ICD-10-CM

## 2022-12-27 DIAGNOSIS — E039 Hypothyroidism, unspecified: Secondary | ICD-10-CM

## 2022-12-27 DIAGNOSIS — N2889 Other specified disorders of kidney and ureter: Secondary | ICD-10-CM

## 2022-12-27 DIAGNOSIS — I10 Essential (primary) hypertension: Secondary | ICD-10-CM | POA: Diagnosis not present

## 2022-12-27 DIAGNOSIS — Z853 Personal history of malignant neoplasm of breast: Secondary | ICD-10-CM

## 2022-12-27 NOTE — Progress Notes (Signed)
Subjective:    [name], female    DOB: 11-Jul-1930, 89 y.o.   MRN: [id number]  Patient here for  Chief Complaint  Patient presents with   Medical Management of Chronic Issues    HPI Here to follow up regarding hypertension and sob.  Saw cardiology 10/15/22.  ECHO - normal LV systolic function, mild AR, trivial MR, moderate PR, mild TR. She is off hctz.  Taking amlodipine and on coreg and avapro.  Blood pressure varies.  Saw pulmonary 11/15/22 (Dr Patsey Berthold) - recommended PFTs and checking overnight oximetry.  Overnight oximetry results  reviewed - recommendation for [facility].  She has been compliant with home oxygen.  Feels she sleeps better.  Had pulmonary function tests 12/2022 - findings are consistent mostly with mild obstructive airways disease with reversible component as seen in asthma. Was started on trelegy and instructed on how to use.  Reports she feels she is doing better.  No chest pain reported.  Eating.  No vomiting.  No abdominal pain or bowel change reported.    Past Medical History:  Diagnosis Date   Actinic keratosis 01/28/2020   R thumb proximal nail fold    Atypical mole 05/13/2018   R distal lat thigh   Atypical mole 09/14/2014   left ant mid thigh   Atypical mole 01/19/2014   left paraspinal mid to upper back   Atypical mole 01/19/2014   right back mid to inf scapula   Atypical mole 01/19/2014   left posterior deltoid medial   Basal cell carcinoma 01/28/2020   left of midline mid dorsum nose   Basal cell carcinoma 01/06/2018   left preauricular    Basal cell carcinoma 11/22/2016   left upper lip   Basal cell carcinoma 03/17/2015   left lateral elbow   Basal cell carcinoma 07/22/2014   R medial inferior knee   Basal cell carcinoma 07/21/2020   Left dorsum hand. Nodular pattern. Excised 08/09/2020, margins free.   Basal cell carcinoma 02/23/2021   right dorsal hand (BCC/SCC), left dorsal hand, right chin - treated with EDC   Family history of  breast cancer    History of breast cancer    History of pneumonia    Hypertension    Hypothyroidism    Macular degeneration    Squamous cell carcinoma of skin 01/06/2018   right dorsum hand at ring finger MCP   Squamous cell carcinoma of skin 07/08/2017   left distal dorsum forearm    Past Surgical History:  Procedure Laterality Date   ABDOMINAL HYSTERECTOMY     ABDOMINAL HYSTERECTOMY     APPENDECTOMY     BREAST LUMPECTOMY     CATARACT EXTRACTION, BILATERAL     CHOLECYSTECTOMY     CORONARY STENT INTERVENTION N/A 07/26/2020   Procedure: CORONARY STENT INTERVENTION;  Surgeon: Isaias Cowman, MD;  Location: [place] [facility];  Service: Cardiovascular;  Laterality: N/A;   LEFT HEART CATH AND CORONARY ANGIOGRAPHY Left 07/26/2020   Procedure: LEFT HEART CATH AND CORONARY ANGIOGRAPHY;  Surgeon: Corey Skains, MD;  Location: [place] [facility];  Service: Cardiovascular;  Laterality: Left;   REPLACEMENT TOTAL KNEE     TONSILLECTOMY AND ADENOIDECTOMY     Family History  Problem Relation Age of Onset   CAD Mother    Cancer Maternal Aunt        unk type   Leukemia Maternal Grandmother    Breast cancer Half-Sister        dx 34s  Social History   Socioeconomic History   Marital status: Widowed    Spouse name: Not on file   Number of children: Not on file   Years of education: Not on file   Highest education level: Not on file  Occupational History   Not on file  Tobacco Use   Smoking status: Former    Packs/day: 1.50    Years: 6.00    Additional pack years: 0.00    Total pack years: 9.00    Types: Cigarettes    Quit date: 76    Years since quitting: 49.2   Smokeless tobacco: Never  Substance and Sexual Activity   Alcohol use: No    Alcohol/week: 0.0 standard drinks of alcohol   Drug use: No   Sexual activity: Not on file  Other Topics Concern   Not on file  Social History Narrative   Married    Social Determinants of Health   Financial Resource  Strain: Low Risk  (10/22/2022)   Overall Financial Resource Strain (CARDIA)    Difficulty of Paying Living Expenses: Not hard at all  Food Insecurity: No Food Insecurity (10/22/2022)   Hunger Vital Sign    Worried About Running Out of Food in the Last Year: Never true    [place] in the Last Year: Never true  Transportation Needs: No Transportation Needs (10/22/2022)   PRAPARE - Hydrologist (Medical): No    Lack of Transportation (Non-Medical): No  Physical Activity: Insufficiently Active (10/22/2022)   Exercise Vital Sign    Days of Exercise per Week: 2 days    Minutes of Exercise per Session: 40 min  Stress: No Stress Concern Present (10/22/2022)   [place]    Feeling of Stress : Not at all  Social Connections: Moderately Integrated (10/22/2022)   Social Connection and Isolation Panel [NHANES]    Frequency of Communication with Friends and Family: More than three times a week    Frequency of Social Gatherings with Friends and Family: More than three times a week    Attends Religious Services: More than 4 times per year    Active Member of [employer] or Organizations: Yes    Attends Archivist Meetings: More than 4 times per year    Marital Status: Widowed     Review of Systems  Constitutional:  Negative for appetite change and unexpected weight change.  HENT:  Negative for congestion and sinus pressure.   Respiratory:  Negative for cough and chest tightness.        Breathing - stable.   Cardiovascular:  Negative for chest pain and palpitations.  Gastrointestinal:  Negative for abdominal pain, diarrhea, nausea and vomiting.  Genitourinary:  Negative for difficulty urinating and dysuria.  Musculoskeletal:  Negative for joint swelling and myalgias.  Skin:  Negative for color change and rash.  Neurological:  Negative for dizziness and headaches.  Psychiatric/Behavioral:   Negative for agitation and dysphoric mood.        Objective:     BP 136/86   Pulse 66   Temp 97.6 F (36.4 C) (Oral)   Ht 5\' 6"  (1.676 m)   Wt 115 lb (52.2 kg)   SpO2 96%   BMI 18.56 kg/m  Wt Readings from Last 3 Encounters:  12/27/22 115 lb (52.2 kg)  12/25/22 116 lb 6.4 oz (52.8 kg)  11/15/22 119 lb 9.6 oz (54.3 kg)    Physical  Exam Vitals reviewed.  Constitutional:      General: She is not in acute distress.    Appearance: Normal appearance.  HENT:     Head: Normocephalic and atraumatic.     Right Ear: External ear normal.     Left Ear: External ear normal.  Eyes:     General: No scleral icterus.       Right eye: No discharge.        Left eye: No discharge.     Conjunctiva/sclera: Conjunctivae normal.  Neck:     Thyroid: No thyromegaly.  Cardiovascular:     Rate and Rhythm: Normal rate and regular rhythm.  Pulmonary:     Effort: No respiratory distress.     Breath sounds: Normal breath sounds. No wheezing.  Abdominal:     General: Bowel sounds are normal.     Palpations: Abdomen is soft.     Tenderness: There is no abdominal tenderness.  Musculoskeletal:        General: No swelling or tenderness.     Cervical back: Neck supple. No tenderness.  Lymphadenopathy:     Cervical: No cervical adenopathy.  Skin:    Findings: No erythema or rash.  Neurological:     Mental Status: She is alert.  Psychiatric:        Mood and Affect: Mood normal.        Behavior: Behavior normal.      Outpatient Encounter Medications as of 12/27/2022  Medication Sig   albuterol (VENTOLIN HFA) 108 (90 Base) MCG/ACT inhaler Inhale 2 puffs into the lungs every 6 (six) hours as needed for wheezing or shortness of breath.   amLODipine (NORVASC) 10 MG tablet TAKE 1 TABLET(10 MG) BY MOUTH DAILY   aspirin EC 81 MG EC tablet Take 1 tablet (81 mg total) by mouth daily. Swallow whole.   atorvastatin (LIPITOR) 40 MG tablet TAKE 1 TABLET(40 MG) BY MOUTH DAILY   calcium citrate-vitamin D  (CITRACAL+D) 315-200 MG-UNIT tablet Take 1 tablet by mouth 2 (two) times daily.   carvedilol (COREG) 6.25 MG tablet Take 6.25 mg by mouth 2 (two) times daily with a meal.   clopidogrel (PLAVIX) 75 MG tablet Take 1 tablet (75 mg total) by mouth daily.   Fluticasone-Umeclidin-Vilant (TRELEGY ELLIPTA) 100-62.5-25 MCG/ACT AEPB Inhale 1 puff into the lungs daily.   ibandronate (BONIVA) 150 MG tablet Take 1 tablet (150 mg total) by mouth every 30 (thirty) days. Take in the morning with a full glass of water, on an empty stomach, and do not take anything else by mouth or lie down for the next 30 min.   irbesartan (AVAPRO) 150 MG tablet Take 1 tablet (150 mg total) by mouth daily.   levothyroxine (SYNTHROID) 137 MCG tablet Take 137 mcg by mouth daily.   Multiple Vitamin (MULTIVITAMIN) capsule Take 1 capsule by mouth daily.   Multiple Vitamins-Minerals (PRESERVISION AREDS 2 PO) Take 1 tablet by mouth 2 (two) times daily.    Vibegron (GEMTESA) 75 MG TABS Take 75 mg by mouth daily.   [DISCONTINUED] hydrochlorothiazide (HYDRODIURIL) 12.5 MG tablet Take by mouth. (Patient not taking: Reported on 12/27/2022)   No facility-administered encounter medications on file as of 12/27/2022.     Lab Results  Component Value Date   WBC 7.6 10/04/2022   HGB 14.0 10/04/2022   HCT 41.7 10/04/2022   PLT 365 10/04/2022   GLUCOSE 99 11/14/2022   CHOL 170 09/18/2022   TRIG 70.0 09/18/2022   HDL 73.50 09/18/2022  LDLCALC 82 09/18/2022   ALT 12 09/18/2022   AST 26 09/18/2022   NA 139 11/14/2022   K 3.8 11/14/2022   CL 95 (L) 11/14/2022   CREATININE 0.92 11/14/2022   BUN 18 11/14/2022   CO2 35 (H) 11/14/2022   TSH 7.06 (H) 09/18/2022   INR 1.1 02/13/2022   HGBA1C 5.6 02/13/2022    CT Abdomen W Contrast  Result Date: 11/05/2022 CLINICAL DATA:  Left renal mass.  Follow-up. * Tracking Code: BO * EXAM: CT ABDOMEN WITH CONTRAST TECHNIQUE: Multidetector CT imaging of the abdomen was performed using the standard  protocol following bolus administration of intravenous contrast. RADIATION DOSE REDUCTION: This exam was performed according to the departmental dose-optimization program which includes automated exposure control, adjustment of the mA and/or kV according to patient size and/or use of iterative reconstruction technique. CONTRAST:  56mL OMNIPAQUE IOHEXOL 300 MG/ML  SOLN COMPARISON:  Ultrasound 06/29/2022 and CT of 01/12/2022 FINDINGS: Lower chest: Right middle lobe volume loss and presumably postinfectious/inflammatory scarring with bronchiectasis. Cardiomegaly, without pericardial or pleural effusion. Lad coronary artery calcification. Hepatobiliary: Normal liver. Cholecystectomy, without biliary ductal dilatation. Pancreas: Normal, without mass or ductal dilatation. Spleen: Normal in size, without focal abnormality. Adrenals/Urinary Tract: Mild left adrenal thickening. Normal right adrenal gland and right kidney. Left renal too small to characterize lesions. Exophytic off the posterolateral interpolar left kidney is a heterogeneous solid mass which measures 3.7 x 3.2 cm on 21/7. Compare 3.3 x 3.1 cm on the prior exam when remeasured in a similar fashion. 4.2 cm craniocaudal on coronal image 49/5 versus 3.8 cm on the prior exam when remeasured in a similar fashion. No hydronephrosis. Stomach/Bowel: Proximal gastric underdistention. Normal abdominal bowel loops. Vascular/Lymphatic: Aortic atherosclerosis. Patent renal veins. Left periaortic index node is similar at 1.0 cm in 30/2. Other: No ascites. No free intraperitoneal air. Anterior abdominal wall subcutaneous nodularity is nonspecific but may be iatrogenic. Musculoskeletal: Disc bulges throughout the lumbar spine. IMPRESSION: 1. Since 01/12/2022, mild enlargement of a 4.2 cm left renal cell carcinoma. 2. No renal vein involvement. 3. Similar borderline abdominal retroperitoneal adenopathy. 4. Incidental findings, including: Coronary artery atherosclerosis. Aortic  Atherosclerosis (ICD10-I70.0). Electronically Signed   By: Abigail Miyamoto M.D.   On: 11/05/2022 11:16       Assessment & Plan:  Primary hypertension Assessment & Plan: Currently on avapro, coreg and amlodipine.  Off hctz.  Blood pressure today as outlined.  Has varied.  Continue to spot check pressures.  Hold on making any changes.  Follow pressures.  Follow metabolic panel.   Orders: -     Basic metabolic panel; Future  Aortic atherosclerosis (HCC) Assessment & Plan: Continue lipitor.    Bronchiectasis without complication [facility]) Assessment & Plan: Recent evaluation by pulmonary.  Had pulmonary function tests 12/2022 - findings are consistent mostly with mild obstructive airways disease with reversible component as seen in asthma. Was started on trelegy and instructed on how to use. Also started on overnight oxygen - 2LNC.  Sleeping better.  Feels breathing overall is stable.  Follow.    Coronary artery disease involving native coronary artery of native heart without angina pectoris Assessment & Plan: Previous admission for NSTEMI.  ECHO with no wall motion abnormality.  EF 60%.  Recommended dual antiplatelet therapy and statin medication.  No chest pain. Breathing stable.  Continue f/u with Dr Sheppard Coil.  Now on coreg.    History of breast cancer Assessment & Plan: S/p lumpectomy.  Declines mammogram.    Hypercholesterolemia Assessment &  Plan: On lipitor. Follow lipid panel and liver function tests.   Orders: -     Hepatic function panel; Future -     Lipid panel; Future  Hypothyroidism, unspecified type Assessment & Plan: On thyroid replacement.  Follow tsh.   Orders: -     TSH; Future  Kidney mass Assessment & Plan: 10/2022 - Dr Erlene Quan - The left renal mass is in the region somewhat, overall growth rate is fairly minimal dating back to 22 months ago when first identified, on average 3 mm annually.  Recommended f/u scan one year.    Weight loss Assessment &  Plan: Reports she is eating well.  Weight relatively stable from last check.  Follow.       Einar Pheasant, MD

## 2022-12-31 ENCOUNTER — Encounter: Payer: Self-pay | Admitting: Internal Medicine

## 2022-12-31 NOTE — Assessment & Plan Note (Signed)
On thyroid replacement.  Follow tsh.  

## 2022-12-31 NOTE — Assessment & Plan Note (Signed)
Recent evaluation by pulmonary.  Had pulmonary function tests 12/2022 - findings are consistent mostly with mild obstructive airways disease with reversible component as seen in asthma. Was started on trelegy and instructed on how to use. Also started on overnight oxygen - 2LNC.  Sleeping better.  Feels breathing overall is stable.  Follow.

## 2022-12-31 NOTE — Assessment & Plan Note (Signed)
S/p lumpectomy.  Declines mammogram.  

## 2022-12-31 NOTE — Assessment & Plan Note (Signed)
Reports she is eating well.  Weight relatively stable from last check.  Follow.

## 2022-12-31 NOTE — Assessment & Plan Note (Signed)
Previous admission for NSTEMI.  ECHO with no wall motion abnormality.  EF 60%.  Recommended dual antiplatelet therapy and statin medication.  No chest pain. Breathing stable.  Continue f/u with Dr Sheppard Coil.  Now on coreg.

## 2022-12-31 NOTE — Assessment & Plan Note (Signed)
Continue lipitor  ?

## 2022-12-31 NOTE — Assessment & Plan Note (Signed)
On lipitor.  Follow lipid panel and liver function tests.   

## 2022-12-31 NOTE — Assessment & Plan Note (Signed)
10/2022 - Dr Erlene Quan - The left renal mass is in the region somewhat, overall growth rate is fairly minimal dating back to 22 months ago when first identified, on average 3 mm annually.  Recommended f/u scan one year.

## 2022-12-31 NOTE — Assessment & Plan Note (Signed)
Currently on avapro, coreg and amlodipine.  Off hctz.  Blood pressure today as outlined.  Has varied.  Continue to spot check pressures.  Hold on making any changes.  Follow pressures.  Follow metabolic panel.

## 2023-01-11 ENCOUNTER — Telehealth: Payer: Self-pay | Admitting: Pulmonary Disease

## 2023-01-11 NOTE — Telephone Encounter (Addendum)
I have tried to contact Alexa Pham again but did not reach her.

## 2023-01-11 NOTE — Telephone Encounter (Signed)
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 [employer] said no RX sent over for them to assist. PT going on a cruise  April 28th and no large tanks avail.   Please call @ [phone]

## 2023-01-11 NOTE — Telephone Encounter (Addendum)
I spoke with the patient and she gave me a verbal ok to speak with her son regarding her oxygen. [name])  I spoke with the patient's son. He said [employer] is wanting to give her a POC to use while they are on vacation but they need an order.  I told him we had spoke with [employer] and the patient a month ago and she is not needing a POC she is needing Travel O2. I told him I would contact [employer] to see what they need to get her the travel O2.  I have left a message for Melissa from [employer] to return my call.

## 2023-01-14 NOTE — Telephone Encounter (Signed)
I have spoke with Melissa. She will reach out to [employer]'s travel O2 team and have them contact the patient's son.  I notified Bob(patient's son).  Nothing further needed.

## 2023-01-17 ENCOUNTER — Other Ambulatory Visit (INDEPENDENT_AMBULATORY_CARE_PROVIDER_SITE_OTHER): Payer: Medicare Other

## 2023-01-17 DIAGNOSIS — E039 Hypothyroidism, unspecified: Secondary | ICD-10-CM | POA: Diagnosis not present

## 2023-01-17 DIAGNOSIS — I10 Essential (primary) hypertension: Secondary | ICD-10-CM

## 2023-01-17 DIAGNOSIS — E7800 Pure hypercholesterolemia, unspecified: Secondary | ICD-10-CM

## 2023-01-17 LAB — HEPATIC FUNCTION PANEL
ALT: 25 U/L (ref 0–35)
AST: 48 U/L — ABNORMAL HIGH (ref 0–37)
Albumin: 3.8 g/dL (ref 3.5–5.2)
Alkaline Phosphatase: 77 U/L (ref 39–117)
Bilirubin, Direct: 0.2 mg/dL (ref 0.0–0.3)
Total Bilirubin: 0.9 mg/dL (ref 0.2–1.2)
Total Protein: 6.4 g/dL (ref 6.0–8.3)

## 2023-01-17 LAB — BASIC METABOLIC PANEL
BUN: 17 mg/dL (ref 6–23)
CO2: 38 mEq/L — ABNORMAL HIGH (ref 19–32)
Calcium: 9.5 mg/dL (ref 8.4–10.5)
Chloride: 96 mEq/L (ref 96–112)
Creatinine, Ser: 0.91 mg/dL (ref 0.40–1.20)
GFR: 55.26 mL/min — ABNORMAL LOW (ref >60.00)
Glucose, Bld: 88 mg/dL (ref 70–99)
Potassium: 3.6 mEq/L (ref 3.5–5.1)
Sodium: 143 mEq/L (ref 135–145)

## 2023-01-17 LAB — TSH: TSH: 6.61 u[IU]/mL — ABNORMAL HIGH (ref 0.35–5.50)

## 2023-01-17 LAB — LIPID PANEL
Cholesterol: 135 mg/dL (ref 0–200)
HDL: 59 mg/dL (ref >39.00)
LDL Cholesterol: 64 mg/dL (ref 0–99)
NonHDL: 75.65
Total CHOL/HDL Ratio: 2
Triglycerides: 59 mg/dL (ref 0.0–149.0)
VLDL: 11.8 mg/dL (ref 0.0–40.0)

## 2023-01-21 ENCOUNTER — Telehealth: Payer: Self-pay

## 2023-01-21 ENCOUNTER — Telehealth: Payer: Self-pay | Admitting: Pulmonary Disease

## 2023-01-21 ENCOUNTER — Other Ambulatory Visit: Payer: Self-pay

## 2023-01-21 DIAGNOSIS — E039 Hypothyroidism, unspecified: Secondary | ICD-10-CM

## 2023-01-21 DIAGNOSIS — R7989 Other specified abnormal findings of blood chemistry: Secondary | ICD-10-CM

## 2023-01-21 MED ORDER — LEVOTHYROXINE SODIUM 150 MCG PO TABS: 150.0000 ug | ORAL_TABLET | Freq: Every day | ORAL | 0 refills | Status: DC

## 2023-01-21 MED ORDER — TRELEGY ELLIPTA 100-62.5-25 MCG/ACT IN AEPB: 1.0000 | INHALATION_SPRAY | Freq: Every day | RESPIRATORY_TRACT | 5 refills | Status: DC

## 2023-01-21 NOTE — Telephone Encounter (Signed)
I spoke with the patient. The Trelegy really helped her and she would like a prescription sent to her pharmacy.  I have sent in the prescription and the patient is aware.  Nothing further needed.

## 2023-01-21 NOTE — Telephone Encounter (Signed)
Phone call was disconnected in the middle of going over labs. Unable to reach when I called back. I have sent in new dose of synthroid. Needs non fasting lab appt in 6 weeks.

## 2023-01-21 NOTE — Telephone Encounter (Signed)
-----   Message from [place] Holiday, MD sent at 01/18/2023  4:55 AM EDT ----- Please notify - TSH is slightly elevated.  Please confirm dose of synthroid she is currently taking.  If on q day, then have her increase to (synthroid).  Will need tsh check in 6 weeks.  Kidney function stable.  One liver test slightly elevated.  Remainder of liver panel. Wnl.  Will follow.  Recheck liver panel in 6 weeks (with tsh). Cholesterol levels look good.

## 2023-01-21 NOTE — Telephone Encounter (Signed)
PT calling saying Dr. Reece Agar gave her a sample inhaler and she really likes it. She would like a RX called in to her [facility] on near [address] or [place] [address].   Her # is [phone]

## 2023-01-28 ENCOUNTER — Encounter: Payer: Self-pay | Admitting: Internal Medicine

## 2023-01-28 ENCOUNTER — Telehealth: Payer: Self-pay | Admitting: Internal Medicine

## 2023-01-28 ENCOUNTER — Ambulatory Visit (INDEPENDENT_AMBULATORY_CARE_PROVIDER_SITE_OTHER): Payer: Medicare Other | Admitting: Internal Medicine

## 2023-01-28 VITALS — BP 138/78 | HR 73 | Temp 98.3°F | Ht 66.0 in | Wt 132.6 lb

## 2023-01-28 DIAGNOSIS — I10 Essential (primary) hypertension: Secondary | ICD-10-CM | POA: Diagnosis not present

## 2023-01-28 DIAGNOSIS — E039 Hypothyroidism, unspecified: Secondary | ICD-10-CM | POA: Diagnosis not present

## 2023-01-28 DIAGNOSIS — I517 Cardiomegaly: Secondary | ICD-10-CM | POA: Diagnosis present

## 2023-01-28 DIAGNOSIS — I2510 Atherosclerotic heart disease of native coronary artery without angina pectoris: Secondary | ICD-10-CM | POA: Diagnosis not present

## 2023-01-28 DIAGNOSIS — N816 Rectocele: Secondary | ICD-10-CM | POA: Diagnosis not present

## 2023-01-28 MED ORDER — IBANDRONATE SODIUM 150 MG PO TABS: 150.0000 mg | ORAL_TABLET | ORAL | 0 refills | Status: DC

## 2023-01-28 MED ORDER — AMLODIPINE BESYLATE 10 MG PO TABS: ORAL_TABLET | ORAL | 1 refills | Status: DC

## 2023-01-28 MED ORDER — ATORVASTATIN CALCIUM 40 MG PO TABS: ORAL_TABLET | ORAL | 1 refills | Status: DC

## 2023-01-28 NOTE — Progress Notes (Signed)
Subjective:    [name], female    DOB: [birth year], 89 y.o.   MRN: [id number]  Patient here for  Chief Complaint  Patient presents with   Rectal Problems    HPI Work in appt.  Work in with concerns regarding - "rectal sphincter muscle not staying in".  She has some issues recently with constipation.  Noticed felt like sphincter muscle not staying in.  Discussed bowels.  Better now.  Discussed benefiber.  Eating.  No nausea or vomiting.  Breathing overall stable.  No chest pain.  No blood in stool. Discussed recent lab.  Recently changed dose of thyroid medication.   Past Medical History:  Diagnosis Date   Actinic keratosis 01/28/2020   R thumb proximal nail fold    Atypical mole 05/13/2018   R distal lat thigh   Atypical mole 09/14/2014   left ant mid thigh   Atypical mole 01/19/2014   left paraspinal mid to upper back   Atypical mole 01/19/2014   right back mid to inf scapula   Atypical mole 01/19/2014   left posterior deltoid medial   Basal cell carcinoma 01/28/2020   left of midline mid dorsum nose   Basal cell carcinoma 01/06/2018   left preauricular    Basal cell carcinoma 11/22/2016   left upper lip   Basal cell carcinoma 03/17/2015   left lateral elbow   Basal cell carcinoma 07/22/2014   R medial inferior knee   Basal cell carcinoma 07/21/2020   Left dorsum hand. Nodular pattern. Excised 08/09/2020, margins free.   Basal cell carcinoma 02/23/2021   right dorsal hand (BCC/SCC), left dorsal hand, right chin - treated with EDC   Family history of breast cancer    History of breast cancer    History of pneumonia    Hypertension    Hypothyroidism    Macular degeneration    Squamous cell carcinoma of skin 01/06/2018   right dorsum hand at ring finger MCP   Squamous cell carcinoma of skin 07/08/2017   left distal dorsum forearm    Past Surgical History:  Procedure Laterality Date   ABDOMINAL HYSTERECTOMY     ABDOMINAL HYSTERECTOMY      APPENDECTOMY     BREAST LUMPECTOMY     CATARACT EXTRACTION, BILATERAL     CHOLECYSTECTOMY     CORONARY STENT INTERVENTION N/A 07/26/2020   Procedure: CORONARY STENT INTERVENTION;  Surgeon: Marcina Millard, MD;  Location: [facility]IVE [facility];  Service: Cardiovascular;  Laterality: N/A;   LEFT HEART CATH AND CORONARY ANGIOGRAPHY Left 07/26/2020   Procedure: LEFT HEART CATH AND CORONARY ANGIOGRAPHY;  Surgeon: Lamar Blinks, MD;  Location: [facility];  Service: Cardiovascular;  Laterality: Left;   REPLACEMENT TOTAL KNEE     TONSILLECTOMY AND ADENOIDECTOMY     Family History  Problem Relation Age of Onset   CAD Mother    Cancer Maternal Aunt        unk type   Leukemia Maternal Grandmother    Breast cancer Half-Sister        dx 1s   Social History   Socioeconomic History   Marital status: Widowed    Spouse name: Not on file   Number of children: Not on file   Years of education: Not on file   Highest education level: Not on file  Occupational History   Not on file  Tobacco Use   Smoking status: Former    Packs/day: 1.50    Years:  6.00    Additional pack years: 0.00    Total pack years: 9.00    Types: Cigarettes    Quit date: 71    Years since quitting: 49.3   Smokeless tobacco: Never  Substance and Sexual Activity   Alcohol use: No    Alcohol/week: 0.0 standard drinks of alcohol   Drug use: No   Sexual activity: Not on file  Other Topics Concern   Not on file  Social History Narrative   Married    Social Determinants of Health   Financial Resource Strain: Low Risk  (10/22/2022)   Overall Financial Resource Strain (CARDIA)    Difficulty of Paying Living Expenses: Not hard at all  Food Insecurity: No Food Insecurity (10/22/2022)   Hunger Vital Sign    Worried About Running Out of Food in the Last Year: Never true    Ran Out of Food in the Last Year: Never true  Transportation Needs: No Transportation Needs (10/22/2022)   PRAPARE - Therapist, art (Medical): No    Lack of Transportation (Non-Medical): No  Physical Activity: Insufficiently Active (10/22/2022)   Exercise Vital Sign    Days of Exercise per Week: 2 days    Minutes of Exercise per Session: 40 min  Stress: No Stress Concern Present (10/22/2022)   [employer] of Occupational Health - Occupational Stress Questionnaire    Feeling of Stress : Not at all  Social Connections: Moderately Integrated (10/22/2022)   Social Connection and Isolation Panel [NHANES]    Frequency of Communication with Friends and Family: More than three times a week    Frequency of Social Gatherings with Friends and Family: More than three times a week    Attends Religious Services: More than 4 times per year    Active Member of [employer] or Organizations: Yes    Attends Banker Meetings: More than 4 times per year    Marital Status: Widowed     Review of Systems  Constitutional:  Negative for appetite change and unexpected weight change.  HENT:  Negative for congestion and sinus pressure.   Respiratory:  Negative for cough, chest tightness and shortness of breath.   Cardiovascular:  Negative for chest pain and palpitations.  Gastrointestinal:  Positive for constipation. Negative for abdominal pain, diarrhea, nausea and vomiting.  Genitourinary:  Negative for difficulty urinating and dysuria.  Musculoskeletal:  Negative for joint swelling and myalgias.  Skin:  Negative for color change and rash.  Neurological:  Negative for dizziness and headaches.  Psychiatric/Behavioral:  Negative for agitation and dysphoric mood.        Objective:     BP 138/78   Pulse 73   Temp 98.3 F (36.8 C) (Oral)   Ht 5\' 6"  (1.676 m)   Wt 132 lb 9.6 oz (60.1 kg)   SpO2 93%   BMI 21.40 kg/m  Wt Readings from Last 3 Encounters:  01/28/23 132 lb 9.6 oz (60.1 kg)  12/27/22 115 lb (52.2 kg)  12/25/22 116 lb 6.4 oz (52.8 kg)    Physical Exam Vitals reviewed.   Constitutional:      General: She is not in acute distress.    Appearance: Normal appearance.  HENT:     Head: Normocephalic and atraumatic.     Right Ear: External ear normal.     Left Ear: External ear normal.  Eyes:     General: No scleral icterus.       Right eye: No  discharge.        Left eye: No discharge.     Conjunctiva/sclera: Conjunctivae normal.  Neck:     Thyroid: No thyromegaly.  Cardiovascular:     Rate and Rhythm: Normal rate and regular rhythm.  Pulmonary:     Effort: No respiratory distress.     Breath sounds: Normal breath sounds. No wheezing.  Abdominal:     General: Bowel sounds are normal.     Palpations: Abdomen is soft.     Tenderness: There is no abdominal tenderness.  Genitourinary:    Comments: Rectum - no palpable mass.  With bearing down - noticed - protrusion of tissue - c/w rectocele.  Musculoskeletal:        General: No swelling or tenderness.     Cervical back: Neck supple. No tenderness.  Lymphadenopathy:     Cervical: No cervical adenopathy.  Skin:    Findings: No erythema or rash.  Neurological:     Mental Status: She is alert.  Psychiatric:        Mood and Affect: Mood normal.        Behavior: Behavior normal.      Outpatient Encounter Medications as of 01/28/2023  Medication Sig   albuterol (VENTOLIN HFA) 108 (90 Base) MCG/ACT inhaler Inhale 2 puffs into the lungs every 6 (six) hours as needed for wheezing or shortness of breath.   aspirin EC 81 MG EC tablet Take 1 tablet (81 mg total) by mouth daily. Swallow whole.   calcium citrate-vitamin D (CITRACAL+D) 315-200 MG-UNIT tablet Take 1 tablet by mouth 2 (two) times daily.   carvedilol (COREG) 6.25 MG tablet Take 6.25 mg by mouth 2 (two) times daily with a meal.   Fluticasone-Umeclidin-Vilant (TRELEGY ELLIPTA) 100-62.5-25 MCG/ACT AEPB Inhale 1 puff into the lungs daily.   irbesartan (AVAPRO) 150 MG tablet Take 1 tablet (150 mg total) by mouth daily.   levothyroxine (SYNTHROID) 150  MCG tablet Take 1 tablet (150 mcg total) by mouth daily.   Multiple Vitamin (MULTIVITAMIN) capsule Take 1 capsule by mouth daily.   Multiple Vitamins-Minerals (PRESERVISION AREDS 2 PO) Take 1 tablet by mouth 2 (two) times daily.    Vibegron (GEMTESA) 75 MG TABS Take 75 mg by mouth daily.   [DISCONTINUED] amLODipine (NORVASC) 10 MG tablet TAKE 1 TABLET(10 MG) BY MOUTH DAILY   [DISCONTINUED] atorvastatin (LIPITOR) 40 MG tablet TAKE 1 TABLET(40 MG) BY MOUTH DAILY   [DISCONTINUED] ibandronate (BONIVA) 150 MG tablet Take 1 tablet (150 mg total) by mouth every 30 (thirty) days. Take in the morning with a full glass of water, on an empty stomach, and do not take anything else by mouth or lie down for the next 30 min.   amLODipine (NORVASC) 10 MG tablet TAKE 1 TABLET(10 MG) BY MOUTH DAILY   atorvastatin (LIPITOR) 40 MG tablet TAKE 1 TABLET(40 MG) BY MOUTH DAILY   clopidogrel (PLAVIX) 75 MG tablet Take 1 tablet (75 mg total) by mouth daily. (Patient not taking: Reported on 01/28/2023)   ibandronate (BONIVA) 150 MG tablet Take 1 tablet (150 mg total) by mouth every 30 (thirty) days. Take in the morning with a full glass of water, on an empty stomach, and do not take anything else by mouth or lie down for the next 30 min.   [DISCONTINUED] levothyroxine (SYNTHROID) 137 MCG tablet Take 137 mcg by mouth daily. (Patient not taking: Reported on 01/28/2023)   No facility-administered encounter medications on file as of 01/28/2023.     Lab Results  Component Value Date   WBC 7.6 10/04/2022   HGB 14.0 10/04/2022   HCT 41.7 10/04/2022   PLT 365 10/04/2022   GLUCOSE 88 01/17/2023   CHOL 135 01/17/2023   TRIG 59.0 01/17/2023   HDL 59.00 01/17/2023   LDLCALC 64 01/17/2023   ALT 25 01/17/2023   AST 48 (H) 01/17/2023   NA 143 01/17/2023   K 3.6 01/17/2023   CL 96 01/17/2023   CREATININE 0.91 01/17/2023   BUN 17 01/17/2023   CO2 38 (H) 01/17/2023   TSH 6.61 (H) 01/17/2023   INR 1.1 02/13/2022   HGBA1C 5.6  02/13/2022    CT Abdomen W Contrast  Result Date: 11/05/2022 CLINICAL DATA:  Left renal mass.  Follow-up. * Tracking Code: BO * EXAM: CT ABDOMEN WITH CONTRAST TECHNIQUE: Multidetector CT imaging of the abdomen was performed using the standard protocol following bolus administration of intravenous contrast. RADIATION DOSE REDUCTION: This exam was performed according to the departmental dose-optimization program which includes automated exposure control, adjustment of the mA and/or kV according to patient size and/or use of iterative reconstruction technique. CONTRAST:  80mL OMNIPAQUE IOHEXOL 300 MG/ML  SOLN COMPARISON:  Ultrasound 06/29/2022 and CT of 01/12/2022 FINDINGS: Lower chest: Right middle lobe volume loss and presumably postinfectious/inflammatory scarring with bronchiectasis. Cardiomegaly, without pericardial or pleural effusion. Lad coronary artery calcification. Hepatobiliary: Normal liver. Cholecystectomy, without biliary ductal dilatation. Pancreas: Normal, without mass or ductal dilatation. Spleen: Normal in size, without focal abnormality. Adrenals/Urinary Tract: Mild left adrenal thickening. Normal right adrenal gland and right kidney. Left renal too small to characterize lesions. Exophytic off the posterolateral interpolar left kidney is a heterogeneous solid mass which measures 3.7 x 3.2 cm on 21/7. Compare 3.3 x 3.1 cm on the prior exam when remeasured in a similar fashion. 4.2 cm craniocaudal on coronal image 49/5 versus 3.8 cm on the prior exam when remeasured in a similar fashion. No hydronephrosis. Stomach/Bowel: Proximal gastric underdistention. Normal abdominal bowel loops. Vascular/Lymphatic: Aortic atherosclerosis. Patent renal veins. Left periaortic index node is similar at 1.0 cm in 30/2. Other: No ascites. No free intraperitoneal air. Anterior abdominal wall subcutaneous nodularity is nonspecific but may be iatrogenic. Musculoskeletal: Disc bulges throughout the lumbar spine.  IMPRESSION: 1. Since 01/12/2022, mild enlargement of a 4.2 cm left renal cell carcinoma. 2. No renal vein involvement. 3. Similar borderline abdominal retroperitoneal adenopathy. 4. Incidental findings, including: Coronary artery atherosclerosis. Aortic Atherosclerosis (ICD10-I70.0). Electronically Signed   By: Jeronimo Greaves M.D.   On: 11/05/2022 11:16       Assessment & Plan:  Rectocele Assessment & Plan: Exam appears to be c/w rectocele.  Discussed importance of keeping bowels moving.  Discussed benefiber.  Discussed further evaluation of rectocele.  Desires no further intervention at this time.  Follow.    Hypothyroidism, unspecified type Assessment & Plan: Just changed synthroid to q day.  Schedule f/u tsh as outlined.    Primary hypertension Assessment & Plan: Currently on avapro, coreg and amlodipine.  Off hctz.  Blood pressure today as outlined.  Has varied.  Continue to spot check pressures.  Hold on making any changes.  Follow pressures.  Follow metabolic panel.    Coronary artery disease involving native coronary artery of native heart without angina pectoris Assessment & Plan: Previous admission for NSTEMI.  ECHO with no wall motion abnormality.  EF 60%.  Recommended dual antiplatelet therapy and statin medication.  No chest pain. Breathing stable.  Continue f/u with Dr Lyn Hollingshead.    Other orders -  amLODIPine Besylate; TAKE 1 TABLET(10 MG) BY MOUTH DAILY  Dispense: 90 tablet; Refill: 1 -     Atorvastatin Calcium; TAKE 1 TABLET(40 MG) BY MOUTH DAILY  Dispense: 90 tablet; Refill: 1 -     Ibandronate Sodium; Take 1 tablet (150 mg total) by mouth every 30 (thirty) days. Take in the morning with a full glass of water, on an empty stomach, and do not take anything else by mouth or lie down for the next 30 min.  Dispense: 12 tablet; Refill: 0     Dale Vienna, MD

## 2023-01-28 NOTE — Telephone Encounter (Signed)
Pt called stating she has a vial problem. Pt stated it might be hemorrhoids and pt is not bleeding, in pain or itching

## 2023-01-28 NOTE — Patient Instructions (Signed)
Benefiber - daily 

## 2023-01-28 NOTE — Telephone Encounter (Signed)
Patient says she is having trouble "controlling her bowels" and described that her sphincter muscle is not staying in. No pain, no bleeding, etc. Pt has been scheduled with Dr Lorin Picket for evaluation

## 2023-02-04 ENCOUNTER — Encounter: Payer: Self-pay | Admitting: Internal Medicine

## 2023-02-04 DIAGNOSIS — K623 Rectal prolapse: Secondary | ICD-10-CM | POA: Insufficient documentation

## 2023-02-04 DIAGNOSIS — N816 Rectocele: Secondary | ICD-10-CM | POA: Insufficient documentation

## 2023-02-04 NOTE — Assessment & Plan Note (Signed)
Exam appears to be c/w rectocele.  Discussed importance of keeping bowels moving.  Discussed benefiber.  Discussed further evaluation of rectocele.  Desires no further intervention at this time.  Follow.

## 2023-02-04 NOTE — Assessment & Plan Note (Signed)
Currently on avapro, coreg and amlodipine.  Off hctz.  Blood pressure today as outlined.  Has varied.  Continue to spot check pressures.  Hold on making any changes.  Follow pressures.  Follow metabolic panel.  

## 2023-02-04 NOTE — Assessment & Plan Note (Signed)
Previous admission for NSTEMI.  ECHO with no wall motion abnormality.  EF 60%.  Recommended dual antiplatelet therapy and statin medication.  No chest pain. Breathing stable.  Continue f/u with Dr Lyn Hollingshead.

## 2023-02-04 NOTE — Assessment & Plan Note (Signed)
Just changed synthroid to q day.  Schedule f/u tsh as outlined.

## 2023-02-17 ENCOUNTER — Encounter: Payer: Self-pay | Admitting: Internal Medicine

## 2023-02-18 NOTE — Telephone Encounter (Signed)
She is being treated by pulmonary and supposed to be getting worked in with cardiology. Just wanted you to see this and let me know if we need to do anything.

## 2023-02-18 NOTE — Telephone Encounter (Signed)
Yes

## 2023-02-18 NOTE — Telephone Encounter (Signed)
See other message

## 2023-02-18 NOTE — Telephone Encounter (Signed)
First 2 days lost 4 lbs. Down 1.6 more pounds today. Breathing is stable. Swelling getting better slowly with lasix. Still not able to get stockings on yet. Cardiology called and scheduled her for 5/31. She would like to see you. Do you want to work her in this week?

## 2023-02-18 NOTE — Telephone Encounter (Signed)
Pt scheduled  

## 2023-02-18 NOTE — Telephone Encounter (Signed)
Please call and confirm if she is having any acute symptoms.  Any improvement with the lasix?  Breathing stable?  Has she heard from cardiology?  I do agree with cardiology follow up.  Will need met b checked - given started lasix.  If does not have a f/u scheduled with cardiology, will need to work in here.

## 2023-02-19 ENCOUNTER — Ambulatory Visit (INDEPENDENT_AMBULATORY_CARE_PROVIDER_SITE_OTHER): Payer: Medicare Other | Admitting: Internal Medicine

## 2023-02-19 VITALS — BP 120/70 | HR 60 | Temp 98.0°F | Resp 16 | Ht 66.0 in | Wt 125.8 lb

## 2023-02-19 DIAGNOSIS — M7989 Other specified soft tissue disorders: Secondary | ICD-10-CM

## 2023-02-19 DIAGNOSIS — I10 Essential (primary) hypertension: Secondary | ICD-10-CM | POA: Diagnosis not present

## 2023-02-19 DIAGNOSIS — J479 Bronchiectasis, uncomplicated: Secondary | ICD-10-CM | POA: Diagnosis not present

## 2023-02-19 DIAGNOSIS — R600 Localized edema: Secondary | ICD-10-CM

## 2023-02-19 DIAGNOSIS — E039 Hypothyroidism, unspecified: Secondary | ICD-10-CM

## 2023-02-19 DIAGNOSIS — E7800 Pure hypercholesterolemia, unspecified: Secondary | ICD-10-CM

## 2023-02-19 DIAGNOSIS — I2510 Atherosclerotic heart disease of native coronary artery without angina pectoris: Secondary | ICD-10-CM

## 2023-02-19 DIAGNOSIS — N2889 Other specified disorders of kidney and ureter: Secondary | ICD-10-CM

## 2023-02-19 DIAGNOSIS — I700 Atherosclerosis of aorta: Secondary | ICD-10-CM

## 2023-02-19 NOTE — Progress Notes (Unsigned)
Subjective:    [name], female    DOB: March 08, 1930, 89 y.o.   MRN: [id number]  Patient here for work in appt.   HPI Work in appt for increased lower extremity swelling and weight gain. Went on a [employer] cruise recently.  Has noticed some leg swelling prior to the trip, but swelling worsened significantly - on her trip.  She reports gained 25 pounds recently.  She saw Dr Zachery Conch ([facility]) 02/15/23. F/u of sob (possible asthma and pulmonary hypertension.  Was noted to have increased lower extremity swelling and weight gain.  Was started on lasix and scheduled a f/u appt with her cardiologist tomorrow.  She comes in today for work in - lower extremity swelling.  Weight is down - 125 pounds (our scale).  Has lost 10 pounds since starting lasix.  She reports her breathing is at her baseline.  No increased cough or congestion.  No chest pain reported.  Eating. Did eat differently on her trip.  Increased sodium.  Also, was not as active - due to worsening leg swelling.  Bowels stable.    Past Medical History:  Diagnosis Date   Actinic keratosis 01/28/2020   R thumb proximal nail fold    Atypical mole 05/13/2018   R distal lat thigh   Atypical mole 09/14/2014   left ant mid thigh   Atypical mole 01/19/2014   left paraspinal mid to upper back   Atypical mole 01/19/2014   right back mid to inf scapula   Atypical mole 01/19/2014   left posterior deltoid medial   Basal cell carcinoma 01/28/2020   left of midline mid dorsum nose   Basal cell carcinoma 01/06/2018   left preauricular    Basal cell carcinoma 11/22/2016   left upper lip   Basal cell carcinoma 03/17/2015   left lateral elbow   Basal cell carcinoma 07/22/2014   R medial inferior knee   Basal cell carcinoma 07/21/2020   Left dorsum hand. Nodular pattern. Excised 08/09/2020, margins free.   Basal cell carcinoma 02/23/2021   right dorsal hand (BCC/SCC), left dorsal hand, right chin - treated with  EDC   Family history of breast cancer    History of breast cancer    History of pneumonia    Hypertension    Hypothyroidism    Macular degeneration    Squamous cell carcinoma of skin 01/06/2018   right dorsum hand at ring finger MCP   Squamous cell carcinoma of skin 07/08/2017   left distal dorsum forearm    Past Surgical History:  Procedure Laterality Date   ABDOMINAL HYSTERECTOMY     ABDOMINAL HYSTERECTOMY     APPENDECTOMY     BREAST LUMPECTOMY     CATARACT EXTRACTION, BILATERAL     CHOLECYSTECTOMY     CORONARY STENT INTERVENTION N/A 07/26/2020   Procedure: CORONARY STENT INTERVENTION;  Surgeon: Marcina Millard, MD;  Location: [facility];  Service: Cardiovascular;  Laterality: N/A;   LEFT HEART CATH AND CORONARY ANGIOGRAPHY Left 07/26/2020   Procedure: LEFT HEART CATH AND CORONARY ANGIOGRAPHY;  Surgeon: Lamar Blinks, MD;  Location: [facility];  Service: Cardiovascular;  Laterality: Left;   REPLACEMENT TOTAL KNEE     TONSILLECTOMY AND ADENOIDECTOMY     Family History  Problem Relation Age of Onset   CAD Mother    Cancer Maternal Aunt        unk type   Leukemia Maternal Grandmother    Breast cancer  Half-Sister        dx 43s   Social History   Socioeconomic History   Marital status: Widowed    Spouse name: Not on file   Number of children: Not on file   Years of education: Not on file   Highest education level: Not on file  Occupational History   Not on file  Tobacco Use   Smoking status: Former    Packs/day: 1.50    Years: 6.00    Additional pack years: 0.00    Total pack years: 9.00    Types: Cigarettes    Quit date: 53    Years since quitting: 49.4   Smokeless tobacco: Never  Substance and Sexual Activity   Alcohol use: No    Alcohol/week: 0.0 standard drinks of alcohol   Drug use: No   Sexual activity: Not on file  Other Topics Concern   Not on file  Social History Narrative   Married    Social Determinants of Health    Financial Resource Strain: Low Risk  (10/22/2022)   Overall Financial Resource Strain (CARDIA)    Difficulty of Paying Living Expenses: Not hard at all  Food Insecurity: No Food Insecurity (10/22/2022)   Hunger Vital Sign    Worried About Running Out of Food in the Last Year: Never true    Ran Out of Food in the Last Year: Never true  Transportation Needs: No Transportation Needs (10/22/2022)   PRAPARE - Administrator, Civil Service (Medical): No    Lack of Transportation (Non-Medical): No  Physical Activity: Insufficiently Active (10/22/2022)   Exercise Vital Sign    Days of Exercise per Week: 2 days    Minutes of Exercise per Session: 40 min  Stress: No Stress Concern Present (10/22/2022)   [employer] of Occupational Health - Occupational Stress Questionnaire    Feeling of Stress : Not at all  Social Connections: Moderately Integrated (10/22/2022)   Social Connection and Isolation Panel [NHANES]    Frequency of Communication with Friends and Family: More than three times a week    Frequency of Social Gatherings with Friends and Family: More than three times a week    Attends Religious Services: More than 4 times per year    Active Member of [employer] or Organizations: Yes    Attends Banker Meetings: More than 4 times per year    Marital Status: Widowed     Review of Systems  Constitutional:  Negative for appetite change and fever.       Weight gain as outlined.   HENT:  Negative for congestion and sinus pressure.   Respiratory:  Negative for cough and chest tightness.        Breathing at baseline.   Cardiovascular:  Positive for leg swelling. Negative for chest pain and palpitations.  Gastrointestinal:  Negative for abdominal pain, nausea and vomiting.  Genitourinary:  Negative for difficulty urinating and dysuria.  Musculoskeletal:  Negative for joint swelling and myalgias.  Skin:  Negative for color change and rash.  Neurological:  Negative for  dizziness and headaches.  Psychiatric/Behavioral:  Negative for agitation and dysphoric mood.        Objective:     BP 120/70   Pulse 60   Temp 98 F (36.7 C)   Resp 16   Ht 5\' 6"  (1.676 m)   Wt 125 lb 12.8 oz (57.1 kg)   SpO2 93%   BMI 20.30 kg/m  Wt Readings from  Last 3 Encounters:  02/19/23 125 lb 12.8 oz (57.1 kg)  01/28/23 132 lb 9.6 oz (60.1 kg)  12/27/22 115 lb (52.2 kg)    Physical Exam Vitals reviewed.  Constitutional:      General: She is not in acute distress.    Appearance: Normal appearance.  HENT:     Head: Normocephalic and atraumatic.     Right Ear: External ear normal.     Left Ear: External ear normal.  Eyes:     General: No scleral icterus.       Right eye: No discharge.        Left eye: No discharge.     Conjunctiva/sclera: Conjunctivae normal.  Neck:     Thyroid: No thyromegaly.  Cardiovascular:     Rate and Rhythm: Normal rate and regular rhythm.  Pulmonary:     Effort: No respiratory distress.     Breath sounds: Normal breath sounds. No wheezing.  Abdominal:     General: Bowel sounds are normal.     Palpations: Abdomen is soft.     Tenderness: There is no abdominal tenderness.  Musculoskeletal:     Cervical back: Neck supple. No tenderness.     Comments: Bilateral pitting lower extremity edema.   Lymphadenopathy:     Cervical: No cervical adenopathy.  Skin:    Findings: No erythema or rash.  Neurological:     Mental Status: She is alert.  Psychiatric:        Mood and Affect: Mood normal.        Behavior: Behavior normal.      Outpatient Encounter Medications as of 02/19/2023  Medication Sig   albuterol (VENTOLIN HFA) 108 (90 Base) MCG/ACT inhaler Inhale 2 puffs into the lungs every 6 (six) hours as needed for wheezing or shortness of breath.   amLODipine (NORVASC) 10 MG tablet TAKE 1 TABLET(10 MG) BY MOUTH DAILY   aspirin EC 81 MG EC tablet Take 1 tablet (81 mg total) by mouth daily. Swallow whole.   atorvastatin (LIPITOR) 40  MG tablet TAKE 1 TABLET(40 MG) BY MOUTH DAILY   calcium citrate-vitamin D (CITRACAL+D) 315-200 MG-UNIT tablet Take 1 tablet by mouth 2 (two) times daily.   carvedilol (COREG) 6.25 MG tablet Take 6.25 mg by mouth 2 (two) times daily with a meal.   clopidogrel (PLAVIX) 75 MG tablet Take 1 tablet (75 mg total) by mouth daily. (Patient not taking: Reported on 01/28/2023)   Fluticasone-Umeclidin-Vilant (TRELEGY ELLIPTA) 100-62.5-25 MCG/ACT AEPB Inhale 1 puff into the lungs daily.   ibandronate (BONIVA) 150 MG tablet Take 1 tablet (150 mg total) by mouth every 30 (thirty) days. Take in the morning with a full glass of water, on an empty stomach, and do not take anything else by mouth or lie down for the next 30 min.   irbesartan (AVAPRO) 150 MG tablet Take 1 tablet (150 mg total) by mouth daily.   levothyroxine (SYNTHROID) 150 MCG tablet Take 1 tablet (150 mcg total) by mouth daily.   Multiple Vitamin (MULTIVITAMIN) capsule Take 1 capsule by mouth daily.   Multiple Vitamins-Minerals (PRESERVISION AREDS 2 PO) Take 1 tablet by mouth 2 (two) times daily.    Vibegron (GEMTESA) 75 MG TABS Take 75 mg by mouth daily.   No facility-administered encounter medications on file as of 02/19/2023.     Lab Results  Component Value Date   WBC 7.6 10/04/2022   HGB 14.0 10/04/2022   HCT 41.7 10/04/2022   PLT 365 10/04/2022  GLUCOSE 88 01/17/2023   CHOL 135 01/17/2023   TRIG 59.0 01/17/2023   HDL 59.00 01/17/2023   LDLCALC 64 01/17/2023   ALT 25 01/17/2023   AST 48 (H) 01/17/2023   NA 143 01/17/2023   K 3.6 01/17/2023   CL 96 01/17/2023   CREATININE 0.91 01/17/2023   BUN 17 01/17/2023   CO2 38 (H) 01/17/2023   TSH 6.61 (H) 01/17/2023   INR 1.1 02/13/2022   HGBA1C 5.6 02/13/2022    CT Abdomen W Contrast  Result Date: 11/05/2022 CLINICAL DATA:  Left renal mass.  Follow-up. * Tracking Code: BO * EXAM: CT ABDOMEN WITH CONTRAST TECHNIQUE: Multidetector CT imaging of the abdomen was performed using the  standard protocol following bolus administration of intravenous contrast. RADIATION DOSE REDUCTION: This exam was performed according to the departmental dose-optimization program which includes automated exposure control, adjustment of the mA and/or kV according to patient size and/or use of iterative reconstruction technique. CONTRAST:  80mL OMNIPAQUE IOHEXOL 300 MG/ML  SOLN COMPARISON:  Ultrasound 06/29/2022 and CT of 01/12/2022 FINDINGS: Lower chest: Right middle lobe volume loss and presumably postinfectious/inflammatory scarring with bronchiectasis. Cardiomegaly, without pericardial or pleural effusion. Lad coronary artery calcification. Hepatobiliary: Normal liver. Cholecystectomy, without biliary ductal dilatation. Pancreas: Normal, without mass or ductal dilatation. Spleen: Normal in size, without focal abnormality. Adrenals/Urinary Tract: Mild left adrenal thickening. Normal right adrenal gland and right kidney. Left renal too small to characterize lesions. Exophytic off the posterolateral interpolar left kidney is a heterogeneous solid mass which measures 3.7 x 3.2 cm on 21/7. Compare 3.3 x 3.1 cm on the prior exam when remeasured in a similar fashion. 4.2 cm craniocaudal on coronal image 49/5 versus 3.8 cm on the prior exam when remeasured in a similar fashion. No hydronephrosis. Stomach/Bowel: Proximal gastric underdistention. Normal abdominal bowel loops. Vascular/Lymphatic: Aortic atherosclerosis. Patent renal veins. Left periaortic index node is similar at 1.0 cm in 30/2. Other: No ascites. No free intraperitoneal air. Anterior abdominal wall subcutaneous nodularity is nonspecific but may be iatrogenic. Musculoskeletal: Disc bulges throughout the lumbar spine. IMPRESSION: 1. Since 01/12/2022, mild enlargement of a 4.2 cm left renal cell carcinoma. 2. No renal vein involvement. 3. Similar borderline abdominal retroperitoneal adenopathy. 4. Incidental findings, including: Coronary artery  atherosclerosis. Aortic Atherosclerosis (ICD10-I70.0). Electronically Signed   By: Jeronimo Greaves M.D.   On: 11/05/2022 11:16       Assessment & Plan:  Primary hypertension Assessment & Plan: Currently on avapro, coreg and amlodipine.  Off hctz.  Blood pressure has been better  discussed amlodipine can contribute to lower extremity swelling.  Plans to discuss with cardiology tomorrow.    Bilateral lower extremity edema Assessment & Plan: Increased swelling and weight gain as outlined.  Significant swelling.  Discussed probably multifactorial.  Reports no change in her breathing.  Hard to walk due to the swelling.  Swelling increased - recent cruise as outlined.  Activity level change - more sitting with legs down.  Increased sodium intake.  Unable to wear compression hose due to leg size now.  Was started on lasix as outlined.  Has had 8-10 pound weight loss since starting.  Reviewed outside labs yesterday.  BUN 21/Cr 1.1 - increased from check 02/15/23.  Discussed venostasis. Unable to wear hose as outlined.  Discussed referral to [facility] for further evaluation/treatment.  Discussed wraps.  Has f/u with cardiology tomorrow.  Hold on increasing lasix, given response. Discussed possible need to decrease to daily dose.  Will need close monitoring of kidney  function and electrolytes.  Refer to [facility].  Elevate legs.  Discussed amlodipine could be aggravating.  Plans to discuss with cardiology tomorrow.    Orders: -     Ambulatory referral to Vascular Surgery  Swelling of lower extremity Assessment & Plan: Increased swelling and weight gain as outlined.  Significant swelling.  Discussed probably multifactorial.  Reports no change in her breathing.  Hard to walk due to the swelling.  Swelling increased - recent cruise as outlined.  Activity level change - more sitting with legs down.  Increased sodium intake.  Unable to wear compression hose due to leg size now.  Was started on lasix as outlined.  Has had 8-10  pound weight loss since starting.  Reviewed outside labs yesterday.  BUN 21/Cr 1.1 - increased from check 02/15/23.  Discussed venostasis. Unable to wear hose as outlined.  Discussed referral to [facility] for further evaluation/treatment.  Discussed wraps.  Has f/u with cardiology tomorrow.  Hold on increasing lasix, given response. Discussed possible need to decrease to daily dose.  Will need close monitoring of kidney function and electrolytes.  Refer to [facility].  Elevate legs.  Discussed amlodipine could be aggravating.  Plans to discuss with cardiology tomorrow.     Bronchiectasis without complication [facility]) Assessment & Plan: Recent evaluation by pulmonary.  Had pulmonary function tests 12/2022 - findings are consistent mostly with mild obstructive airways disease with reversible component as seen in asthma. Was started on trelegy and instructed on how to use. Also started on overnight oxygen - 2LNC.  Saw [facility] Friday as outlined.  Overall she feels breathing is stable.    Kidney mass Assessment & Plan: 10/2022 - Dr Apolinar Junes - The left renal mass is in the region somewhat, overall growth rate is fairly minimal dating back to 22 months ago when first identified, on average 3 mm annually.  Recommended f/u scan one year.    Hypothyroidism, unspecified type Assessment & Plan: Just changed synthroid dosing due to slightly elevated TSH 01/17/23.  Schedule f/u tsh.    Hypercholesterolemia Assessment & Plan: On lipitor. Follow lipid panel and liver function tests.    Coronary artery disease involving native coronary artery of native heart without angina pectoris Assessment & Plan: Previous admission for NSTEMI.  ECHO with no wall motion abnormality.  EF 60%.  Recommended dual antiplatelet therapy and statin medication.  No chest pain. Breathing stable. Recent increased lower extremity edema.  Lasix added by pulmonary. Discussed with her today as outlined.  Has f/u planned tomorrow with cardiology.     Aortic atherosclerosis (HCC) Assessment & Plan: Continue lipitor.       Dale Mead, MD

## 2023-02-20 ENCOUNTER — Encounter: Payer: Self-pay | Admitting: Internal Medicine

## 2023-02-20 DIAGNOSIS — R600 Localized edema: Secondary | ICD-10-CM | POA: Insufficient documentation

## 2023-02-20 NOTE — Assessment & Plan Note (Signed)
10/2022 - Dr Brandon - The left renal mass is in the region somewhat, overall growth rate is fairly minimal dating back to 22 months ago when first identified, on average 3 mm annually.  Recommended f/u scan one year.  

## 2023-02-20 NOTE — Assessment & Plan Note (Signed)
Just changed synthroid dosing due to slightly elevated TSH 01/17/23.  Schedule f/u tsh.

## 2023-02-20 NOTE — Assessment & Plan Note (Signed)
On lipitor.  Follow lipid panel and liver function tests.   

## 2023-02-20 NOTE — Assessment & Plan Note (Signed)
Increased swelling and weight gain as outlined.  Significant swelling.  Discussed probably multifactorial.  Reports no change in her breathing.  Hard to walk due to the swelling.  Swelling increased - recent cruise as outlined.  Activity level change - more sitting with legs down.  Increased sodium intake.  Unable to wear compression hose due to leg size now.  Was started on lasix as outlined.  Has had 8-10 pound weight loss since starting.  Reviewed outside labs yesterday.  BUN 21/Cr 1.1 - increased from check 02/15/23.  Discussed venostasis. Unable to wear hose as outlined.  Discussed referral to [facility] for further evaluation/treatment.  Discussed wraps.  Has f/u with cardiology tomorrow.  Hold on increasing lasix, given response. Discussed possible need to decrease to daily dose.  Will need close monitoring of kidney function and electrolytes.  Refer to [facility].  Elevate legs.  Discussed amlodipine could be aggravating.  Plans to discuss with cardiology tomorrow.

## 2023-02-20 NOTE — Assessment & Plan Note (Addendum)
Recent evaluation by pulmonary.  Had pulmonary function tests 12/2022 - findings are consistent mostly with mild obstructive airways disease with reversible component as seen in asthma. Was started on trelegy and instructed on how to use. Also started on overnight oxygen - 2LNC.  Saw [facility] Friday as outlined.  Overall she feels breathing is stable.

## 2023-02-20 NOTE — Assessment & Plan Note (Signed)
Continue lipitor  ?

## 2023-02-20 NOTE — Assessment & Plan Note (Signed)
Previous admission for NSTEMI.  ECHO with no wall motion abnormality.  EF 60%.  Recommended dual antiplatelet therapy and statin medication.  No chest pain. Breathing stable. Recent increased lower extremity edema.  Lasix added by pulmonary. Discussed with her today as outlined.  Has f/u planned tomorrow with cardiology.

## 2023-02-20 NOTE — Assessment & Plan Note (Signed)
Currently on avapro, coreg and amlodipine.  Off hctz.  Blood pressure has been better  discussed amlodipine can contribute to lower extremity swelling.  Plans to discuss with cardiology tomorrow.

## 2023-02-21 ENCOUNTER — Ambulatory Visit (INDEPENDENT_AMBULATORY_CARE_PROVIDER_SITE_OTHER): Payer: Medicare Other | Admitting: Nurse Practitioner

## 2023-02-21 VITALS — BP 152/69 | HR 68 | Resp 17 | Ht 66.0 in | Wt 124.0 lb

## 2023-02-21 DIAGNOSIS — E7800 Pure hypercholesterolemia, unspecified: Secondary | ICD-10-CM

## 2023-02-21 DIAGNOSIS — I2510 Atherosclerotic heart disease of native coronary artery without angina pectoris: Secondary | ICD-10-CM | POA: Diagnosis not present

## 2023-02-21 DIAGNOSIS — M7989 Other specified soft tissue disorders: Secondary | ICD-10-CM | POA: Diagnosis not present

## 2023-02-22 ENCOUNTER — Encounter (INDEPENDENT_AMBULATORY_CARE_PROVIDER_SITE_OTHER): Payer: Self-pay | Admitting: Nurse Practitioner

## 2023-02-22 NOTE — Progress Notes (Signed)
Subjective:    [name], female    DOB: 08/25/1930, 89 y.o.   MRN: [id number] Chief Complaint  Patient presents with   Establish Care    The patient is a 89 year old female who presents today as a referral from Dr. Lorin Picket in regards to bilateral lower extremity edema.  The patient notes that the swelling became drastically worse after a recent cruise.  She notes that during this timeframe she was doing extensive amounts of sitting and waiting in the airport terminals.  Since she returned she seen her pulmonologist and cardiologist who have instituted medication changes including introduction of diuretics.  The patient initially had swelling up to her thighs and she had about a noted 35 pound weight gain.  She has lost most of the weight but still retains some.  She denies any open wounds or ulcerations.  She denies any weeping of the lower extremities.  She notes that prior to this incident she wore medical grade compression stockings daily but due to the extreme swelling she has been unable to wear them.  Today there is still some notable swelling but the swelling is confined below the knee area.    Review of Systems  Cardiovascular:  Positive for leg swelling.  All other systems reviewed and are negative.      Objective:   Physical Exam Vitals reviewed.  HENT:     Head: Normocephalic.  Cardiovascular:     Rate and Rhythm: Normal rate.  Pulmonary:     Effort: Pulmonary effort is normal.  Musculoskeletal:     Right lower leg: Pitting Edema present.     Left lower leg: 2+ Pitting Edema present.  Skin:    General: Skin is warm and dry.  Neurological:     Mental Status: She is alert and oriented to person, place, and time.  Psychiatric:        Mood and Affect: Mood normal.        Behavior: Behavior normal.        Thought Content: Thought content normal.        Judgment: Judgment normal.     BP (!) 152/69 (BP Location: Left Arm)   Pulse 68   Resp 17   Ht 5'  6" (1.676 m)   Wt 124 lb (56.2 kg)   BMI 20.01 kg/m   Past Medical History:  Diagnosis Date   Actinic keratosis 01/28/2020   R thumb proximal nail fold    Atypical mole 05/13/2018   R distal lat thigh   Atypical mole 09/14/2014   left ant mid thigh   Atypical mole 01/19/2014   left paraspinal mid to upper back   Atypical mole 01/19/2014   right back mid to inf scapula   Atypical mole 01/19/2014   left posterior deltoid medial   Basal cell carcinoma 01/28/2020   left of midline mid dorsum nose   Basal cell carcinoma 01/06/2018   left preauricular    Basal cell carcinoma 11/22/2016   left upper lip   Basal cell carcinoma 03/17/2015   left lateral elbow   Basal cell carcinoma 07/22/2014   R medial inferior knee   Basal cell carcinoma 07/21/2020   Left dorsum hand. Nodular pattern. Excised 08/09/2020, margins free.   Basal cell carcinoma 02/23/2021   right dorsal hand (BCC/SCC), left dorsal hand, right chin - treated with EDC   Family history of breast cancer    History of breast cancer    History of pneumonia  Hypertension    Hypothyroidism    Macular degeneration    Squamous cell carcinoma of skin 01/06/2018   right dorsum hand at ring finger MCP   Squamous cell carcinoma of skin 07/08/2017   left distal dorsum forearm     Social History   Socioeconomic History   Marital status: Widowed    Spouse name: Not on file   Number of children: Not on file   Years of education: Not on file   Highest education level: Not on file  Occupational History   Not on file  Tobacco Use   Smoking status: Former    Packs/day: 1.50    Years: 6.00    Additional pack years: 0.00    Total pack years: 9.00    Types: Cigarettes    Quit date: 18    Years since quitting: 49.4   Smokeless tobacco: Never  Substance and Sexual Activity   Alcohol use: No    Alcohol/week: 0.0 standard drinks of alcohol   Drug use: No   Sexual activity: Not on file  Other Topics Concern   Not on  file  Social History Narrative   Married    Social Determinants of Health   Financial Resource Strain: Low Risk  (10/22/2022)   Overall Financial Resource Strain (CARDIA)    Difficulty of Paying Living Expenses: Not hard at all  Food Insecurity: No Food Insecurity (10/22/2022)   Hunger Vital Sign    Worried About Running Out of Food in the Last Year: Never true    Ran Out of Food in the Last Year: Never true  Transportation Needs: No Transportation Needs (10/22/2022)   PRAPARE - Administrator, Civil Service (Medical): No    Lack of Transportation (Non-Medical): No  Physical Activity: Insufficiently Active (10/22/2022)   Exercise Vital Sign    Days of Exercise per Week: 2 days    Minutes of Exercise per Session: 40 min  Stress: No Stress Concern Present (10/22/2022)   [employer] of Occupational Health - Occupational Stress Questionnaire    Feeling of Stress : Not at all  Social Connections: Moderately Integrated (10/22/2022)   Social Connection and Isolation Panel [NHANES]    Frequency of Communication with Friends and Family: More than three times a week    Frequency of Social Gatherings with Friends and Family: More than three times a week    Attends Religious Services: More than 4 times per year    Active Member of [employer] or Organizations: Yes    Attends Banker Meetings: More than 4 times per year    Marital Status: Widowed  Intimate Partner Violence: Not At Risk (10/22/2022)   Humiliation, Afraid, Rape, and Kick questionnaire    Fear of Current or Ex-Partner: No    Emotionally Abused: No    Physically Abused: No    Sexually Abused: No    Past Surgical History:  Procedure Laterality Date   ABDOMINAL HYSTERECTOMY     ABDOMINAL HYSTERECTOMY     APPENDECTOMY     BREAST LUMPECTOMY     CATARACT EXTRACTION, BILATERAL     CHOLECYSTECTOMY     CORONARY STENT INTERVENTION N/A 07/26/2020   Procedure: CORONARY STENT INTERVENTION;  Surgeon: Marcina Millard, MD;  Location: [facility] INVASIVE CV LAB;  Service: Cardiovascular;  Laterality: N/A;   LEFT HEART CATH AND CORONARY ANGIOGRAPHY Left 07/26/2020   Procedure: LEFT HEART CATH AND CORONARY ANGIOGRAPHY;  Surgeon: Lamar Blinks, MD;  Location: [facility] INVASIVE CV  LAB;  Service: Cardiovascular;  Laterality: Left;   REPLACEMENT TOTAL KNEE     TONSILLECTOMY AND ADENOIDECTOMY      Family History  Problem Relation Age of Onset   CAD Mother    Cancer Maternal Aunt        unk type   Leukemia Maternal Grandmother    Breast cancer Half-Sister        dx 45s    Allergies  Allergen Reactions   Ticagrelor Shortness Of Breath    Extreme        Latest Ref Rng & Units 10/04/2022    2:41 PM 02/15/2022    5:37 AM 02/14/2022    3:48 AM  CBC  WBC 4.0 - 10.5 K/uL 7.6  5.8  5.7   Hemoglobin 12.0 - 15.0 g/dL 16.1  09.6  04.5   Hematocrit 36.0 - 46.0 % 41.7  41.9  40.1   Platelets 150 - 400 K/uL 365  163  170       CMP     Component Value Date/Time   NA 143 01/17/2023 0900   K 3.6 01/17/2023 0900   CL 96 01/17/2023 0900   CO2 38 (H) 01/17/2023 0900   GLUCOSE 88 01/17/2023 0900   BUN 17 01/17/2023 0900   CREATININE 0.91 01/17/2023 0900   CREATININE 0.81 01/02/2019 1646   CALCIUM 9.5 01/17/2023 0900   PROT 6.4 01/17/2023 0900   ALBUMIN 3.8 01/17/2023 0900   AST 48 (H) 01/17/2023 0900   ALT 25 01/17/2023 0900   ALKPHOS 77 01/17/2023 0900   BILITOT 0.9 01/17/2023 0900   GFRNONAA 49 (L) 10/04/2022 1441   GFRAA >60 05/05/2020 0433     No results found.     Assessment & Plan:   1. Leg swelling I suspect that the patient's severe lower extremity swelling is multifactorial.  I believe his lower extremity be injured by her cardiac function she has some HFpEF but also suspect that she may have some underlying venous insufficiency or lymphedema.  Will place the patient in Unna boots today in order to gain control the swelling so that she can return using her compression socks.  Will  plan on wrapping over the next 4 weeks.  She is advised to continue to elevate her lower extremities when not engaging in activity.  When the patient returns we will have noninvasive studies done in order to evaluate for possible venous insufficiency.  2. Hypercholesterolemia Continue statin as ordered and reviewed, no changes at this time  3. Coronary artery disease involving native coronary artery of native heart without angina pectoris Believe that the patient has some issues contributing to her lower extremity edema and this is likely multifactorial cause.  The patient's coronary artery disease likely plays a factor.   Current Outpatient Medications on File Prior to Visit  Medication Sig Dispense Refill   albuterol (VENTOLIN HFA) 108 (90 Base) MCG/ACT inhaler Inhale 2 puffs into the lungs every 6 (six) hours as needed for wheezing or shortness of breath. 18 g 0   amLODipine (NORVASC) 10 MG tablet TAKE 1 TABLET(10 MG) BY MOUTH DAILY 90 tablet 1   aspirin EC 81 MG EC tablet Take 1 tablet (81 mg total) by mouth daily. Swallow whole. 30 tablet 0   atorvastatin (LIPITOR) 40 MG tablet TAKE 1 TABLET(40 MG) BY MOUTH DAILY 90 tablet 1   calcium citrate-vitamin D (CITRACAL+D) 315-200 MG-UNIT tablet Take 1 tablet by mouth 2 (two) times daily.     carvedilol (COREG)  6.25 MG tablet Take 6.25 mg by mouth 2 (two) times daily with a meal.     clopidogrel (PLAVIX) 75 MG tablet Take 1 tablet (75 mg total) by mouth daily. 90 tablet 1   Fluticasone-Umeclidin-Vilant (TRELEGY ELLIPTA) 100-62.5-25 MCG/ACT AEPB Inhale 1 puff into the lungs daily. 60 each 5   furosemide (LASIX) 20 MG tablet Take by mouth.     ibandronate (BONIVA) 150 MG tablet Take 1 tablet (150 mg total) by mouth every 30 (thirty) days. Take in the morning with a full glass of water, on an empty stomach, and do not take anything else by mouth or lie down for the next 30 min. 12 tablet 0   irbesartan (AVAPRO) 150 MG tablet Take 1 tablet (150 mg  total) by mouth daily. 30 tablet 11   levothyroxine (SYNTHROID) 150 MCG tablet Take 1 tablet (150 mcg total) by mouth daily. 90 tablet 0   Multiple Vitamin (MULTIVITAMIN) capsule Take 1 capsule by mouth daily.     Multiple Vitamins-Minerals (PRESERVISION AREDS 2 PO) Take 1 tablet by mouth 2 (two) times daily.      Vibegron (GEMTESA) 75 MG TABS Take 75 mg by mouth daily. 90 tablet 2   No current facility-administered medications on file prior to visit.    There are no Patient Instructions on file for this visit. No follow-ups on file.   Georgiana Spinner, NP

## 2023-02-25 ENCOUNTER — Ambulatory Visit (INDEPENDENT_AMBULATORY_CARE_PROVIDER_SITE_OTHER): Payer: Medicare Other | Admitting: Pulmonary Disease

## 2023-02-25 ENCOUNTER — Encounter: Payer: Self-pay | Admitting: Pulmonary Disease

## 2023-02-25 VITALS — BP 122/78 | HR 68 | Temp 97.4°F | Ht 66.0 in | Wt 118.2 lb

## 2023-02-25 DIAGNOSIS — R0600 Dyspnea, unspecified: Secondary | ICD-10-CM

## 2023-02-25 DIAGNOSIS — G4734 Idiopathic sleep related nonobstructive alveolar hypoventilation: Secondary | ICD-10-CM

## 2023-02-25 DIAGNOSIS — I272 Pulmonary hypertension, unspecified: Secondary | ICD-10-CM

## 2023-02-25 DIAGNOSIS — J479 Bronchiectasis, uncomplicated: Secondary | ICD-10-CM

## 2023-02-25 DIAGNOSIS — J4530 Mild persistent asthma, uncomplicated: Secondary | ICD-10-CM | POA: Diagnosis not present

## 2023-02-25 DIAGNOSIS — G4736 Sleep related hypoventilation in conditions classified elsewhere: Secondary | ICD-10-CM

## 2023-02-25 MED ORDER — TRELEGY ELLIPTA 100-62.5-25 MCG/ACT IN AEPB: 1.0000 | INHALATION_SPRAY | Freq: Every day | RESPIRATORY_TRACT | 0 refills | Status: DC

## 2023-02-25 NOTE — Patient Instructions (Signed)
Continue Trelegy 1 puff daily.  Sent a request to adapt for your travel concentrator.  Requested the smaller size.  As we discussed back in February, I still think that there is a degree of pulmonary hypertension causing your shortness of breath.  You may need a right heart catheterization to determine.  Continue using your oxygen at nighttime.  We will see you in follow-up in 6 to 8 weeks time call sooner should any new problems arise.

## 2023-02-25 NOTE — Progress Notes (Unsigned)
Subjective:    [name], female    DOB: 07/21/1930, 89 y.o.   MRN: [id number] Patient Care Team: Dale Aspers, MD as PCP - General (Internal Medicine) Salena Saner, MD as Consulting Physician (Pulmonary Disease) Elige Radon, MD as Referring Physician (Cardiology)  Chief Complaint  Patient presents with   Follow-up    SOB with exertion. No cough. No wheezing.   HPI Alexa Pham is a 89 year old very remote/light smoker, who presents for follow-up of shortness of breath that has gotten worse over the last 6-8  months.  We last saw Alexa Pham on 25 December 2022, this is a scheduled visit.  Recall that she was initially seen on January 2020 at time she had had an issue with pneumonia and films were consistent with bronchiectasis and tree-in-bud type peribronchial nodular opacities. This is a pattern seen with MAC.  Subsequently we saw her again in June 2020, her CT at that time had improved.  She remained asymptomatic from the MAC standpoint.  She was supposed to see [place] in January 2021 however canceled that appointment and then eventually was seen in November 2021.  At that time she was complaining of shortness of breath that had been worse since her cardiac catheterization in late October 2021 she had been placed on Brillinta.  It appeared that Brillinta was worsening symptoms of dyspnea this medication was switched and the patient actually did better after that.  Because her dyspnea resolved she never followed up with [place] after that.  She reconnected with [place] on 15 November 2022, after a hiatus of 3 years.  She complained of orthopnea with paroxysmal nocturnal dyspnea on several occasions during the month prior.  She had noted increasing fatigue and dyspnea on exertion. No wheezing.  No sputum production no hemoptysis.  No fevers, chills or sweats.  She coughed only when she laid flat.  She has not had any gastroesophageal reflux symptoms.  At her visit on 8 February pulmonary  function testing was ordered as well as overnight oximetry.  Patient had ambulatory oximetry performed during the visit that did not show significant O2 desaturations.  During that visit it was postulated that there was a possibility for pulmonary hypertension and a recommendation for consideration of right heart cath was done.  Her PFTs were finally performed on 14 March which were consistent with moderate obstructive defect with bronchodilator response.  She was started on Trelegy Ellipta.  She has difficulty understanding metered-dose inhalers but was able to understand the use of the Trelegy.  She also was noted to have significant nocturnal hypoxemia and is now on 2 L/min nocturnally.  She is compliant with the therapy and notes benefit from the same.  She saw pulmonary at [facility] on 15 Feb 2023 and at that time was noted to have significant lower extremity edema, which had occurred over the course of 6 to 8 weeks, and was placed on Lasix.  She did not note much improvement on her shortness of breath with the Lasix.  She now has her lower extremities wrapped in Unna boots.  Her BNP was elevated at 1340 at that visit.  Subsequently she saw cardiology at [facility] on 20 Feb 2023 and after that she saw the vein clinic and had her legs wrapped.  She states that she is scheduled for "a lot of tests" at [facility].  On today's visit she notes that she continues to have shortness of breath with exertion, she has no wheezing or cough.  She has not noted significant improvement in her shortness of breath with much of anything tried.  I suspect her shortness of breath is multifactorial.  Still there is a concern for pulmonary hypertension.  This was discussed with the patient and her son who accompanies her today.  She states that she is to travel again in August and would like for [place] to send request to her DME company for a portable concentrator for that travel.   DATA 10/15/2022 echocardiogram [facility]): Normal LVEF  (calculation 54%), normal RV systolic function mild AR, trivial MR, moderate PR, mild TR.  Estimated RVSP 50 mmHg. 12/14/2022 overnight oximetry on room air: Patient exhibited desaturations as low as 68%.  Please note that the study states that it was done on 06 September 2008, this is erroneous.  Testing was done in March 2024. 12/20/2022 PFTs: FEV1 0.88 L or 53% predicted, FVC 1.37 L or 61% of predicted, FEV1/FVC 64%, there is a 32% net change post bronchodilator with FEV1 improving to 1.17 L or 71% predicted after bronchodilator.  Lung volumes were mildly reduced.  Could not perform diffusion maneuvers.  Consistent with obstructive airways disease with reversibility, likely concomitant restriction.  Diffusion capacity could not be obtained due to patient's inability to perform the maneuvers.  Review of Systems A 10 point review of systems was performed and it is as noted above otherwise negative.  Patient Active Problem List   Diagnosis Date Noted   Bilateral lower extremity edema 02/20/2023   Rectocele 02/04/2023   Urge incontinence 11/08/2022   Shortness of breath 10/04/2022   Cough 06/24/2022   Fatigue 06/03/2022   Nocturia 06/03/2022   Premature beats 05/31/2022   Osteopenia 02/18/2022   NSTEMI (non-ST elevated myocardial infarction) (HCC) 02/13/2022   Basal cell carcinoma 02/23/2021   Genetic testing 08/31/2020   Family history of breast cancer    Kidney mass 08/08/2020   CAD (coronary artery disease) 07/26/2020   Angina pectoris (HCC) 07/04/2020   Abnormal liver function test 06/15/2020   Dizziness 05/04/2020   Elevated troponin 05/04/2020   Hypomagnesemia 05/04/2020   Orthostatic hypotension 05/04/2020   Shingles 01/10/2020   Weight loss 05/10/2019   Stress 04/19/2019   Aortic atherosclerosis (HCC) 11/20/2018   Abnormal CT lung screening 11/07/2018   Bronchiectasis without complication (HCC) 11/07/2018   Abnormal chest CT 10/19/2018   Health care maintenance 07/06/2018    History of breast cancer 12/01/2017   Macular degeneration 12/01/2017   Hypertension 11/28/2017   Hypothyroidism 11/28/2017   Hypercholesterolemia 11/28/2017   Social History   Tobacco Use   Smoking status: Former    Packs/day: 1.50    Years: 6.00    Additional pack years: 0.00    Total pack years: 9.00    Types: Cigarettes    Quit date: 1975    Years since quitting: 49.4   Smokeless tobacco: Never  Substance Use Topics   Alcohol use: No    Alcohol/week: 0.0 standard drinks of alcohol   Allergies  Allergen Reactions   Ticagrelor Shortness Of Breath    Extreme    Immunization History  Administered Date(s) Administered   Fluad Quad(high Dose 65+) 06/14/2019, 06/10/2020, 09/21/2021   Influenza, High Dose Seasonal PF 07/04/2015, 07/02/2017, 07/03/2018   Influenza-Unspecified 06/22/2016, 07/08/2022   PFIZER Comirnaty(Gray Top)Covid-19 Tri-Sucrose Vaccine 11/06/2019, 11/27/2019, 07/11/2020   PFIZER(Purple Top)SARS-COV-2 Vaccination 11/06/2019, 11/27/2019   Pneumococcal Conjugate-13 04/21/2016   Pneumococcal Polysaccharide-23 05/05/2012   Tdap 11/09/2014       Objective:   Physical  Exam BP 122/78 (BP Location: Left Arm, Cuff Size: Normal)   Pulse 68   Temp (!) 97.4 F (36.3 C)   Ht 5\' 6"  (1.676 m)   Wt 118 lb 3.2 oz (53.6 kg)   SpO2 94%   BMI 19.08 kg/m   SpO2: 94 % O2 Device: None (Room air)  GENERAL: Slender, well-developed elderly woman in no acute respiratory distress.  Fully ambulatory. HEAD: Normocephalic, atraumatic.  EYES: Pupils equal, round, reactive to light.  No scleral icterus.  MOUTH: Dentition intact, some chipped teeth, oral mucosa moist.  No thrush. NECK: Supple. No thyromegaly. Trachea midline. No JVD.  No adenopathy. PULMONARY: Good air entry bilaterally.  Coarse, otherwise no adventitious sounds. CARDIOVASCULAR: S1 and S2. Regular rate and rhythm patient with occasional extrasystoles noted.  No rubs, murmurs or gallops heard. ABDOMEN:  Benign. MUSCULOSKELETAL: No joint deformity, no clubbing, LE's in [employer].+2 edema.  NEUROLOGIC: No overt focal deficit.  No gait disturbance.  Speech is fluent. SKIN: Intact,warm,dry. PSYCH: Mood and behavior normal.     Assessment & Plan:     ICD-10-CM   1. Mild persistent asthma without complication  J45.30    Well compensated with Trelegy Ellipta Does not wish to have rescue inhaler Continue Trelegy    2. Pulmonary hypertension (HCC)  I27.20    See my note from 8 February Suspect this is significant Recommend right heart cath Defer to cardiology Multifactorial likely Continue nocturnal O2    3. Bronchiectasis without complication (HCC)  J47.9    Compensated No recent exacerbation    4. Dyspnea, unspecified type  R06.00    Minimal relief with Trelegy Multifactorial etiology Suspect pulmonary hypertension playing a role    5. Nocturnal hypoxemia  G47.34 AMB REFERRAL FOR DME   Suspect due to pulmonary hypertension Patient compliant with oxygen at 2 L/min Patient notes benefit of therapy     Orders Placed This Encounter  Procedures   AMB REFERRAL FOR DME    Referral Priority:   Routine    Referral Type:   Durable Medical Equipment Purchase    Number of Visits Requested:   1   Meds ordered this encounter  Medications   Fluticasone-Umeclidin-Vilant (TRELEGY ELLIPTA) 100-62.5-25 MCG/ACT AEPB    Sig: Inhale 1 puff into the lungs daily.    Dispense:  28 each    Refill:  0    Order Specific Question:   Lot Number?    Answer:   [id number]    Order Specific Question:   Expiration Date?    Answer:   05/08/2024    Order Specific Question:   Quantity    Answer:   1   Will see the patient in follow-up in 6 to 8 weeks time she is to contact us prior to that time should any new difficulties arise.  Gailen Shelter, MD Advanced Bronchoscopy PCCM [facility]    *This note was dictated using voice recognition software/Dragon.  Despite best efforts to  proofread, errors can occur which can change the meaning. Any transcriptional errors that result from this process are unintentional and may not be fully corrected at the time of dictation.

## 2023-02-28 ENCOUNTER — Ambulatory Visit (INDEPENDENT_AMBULATORY_CARE_PROVIDER_SITE_OTHER): Payer: Medicare Other | Admitting: Nurse Practitioner

## 2023-02-28 ENCOUNTER — Encounter (INDEPENDENT_AMBULATORY_CARE_PROVIDER_SITE_OTHER): Payer: Self-pay

## 2023-02-28 VITALS — BP 150/60 | HR 70 | Resp 15 | Wt 110.6 lb

## 2023-02-28 DIAGNOSIS — M7989 Other specified soft tissue disorders: Secondary | ICD-10-CM

## 2023-02-28 NOTE — Progress Notes (Signed)
History of Present Illness  There is no documented history at this time  Assessments & Plan   There are no diagnoses linked to this encounter.    Additional instructions  Subjective:  Patient presents with venous ulcer of the Bilateral lower extremity.    Procedure:  3 layer unna wrap was placed Bilateral lower extremity.   Plan:   Follow up in one week.  

## 2023-03-01 ENCOUNTER — Encounter (INDEPENDENT_AMBULATORY_CARE_PROVIDER_SITE_OTHER): Payer: Self-pay | Admitting: Nurse Practitioner

## 2023-03-06 ENCOUNTER — Encounter (INDEPENDENT_AMBULATORY_CARE_PROVIDER_SITE_OTHER): Payer: Self-pay

## 2023-03-06 ENCOUNTER — Ambulatory Visit (INDEPENDENT_AMBULATORY_CARE_PROVIDER_SITE_OTHER): Payer: Medicare Other | Admitting: Nurse Practitioner

## 2023-03-06 VITALS — BP 135/68 | HR 70 | Resp 16 | Wt 109.6 lb

## 2023-03-06 DIAGNOSIS — M7989 Other specified soft tissue disorders: Secondary | ICD-10-CM

## 2023-03-06 NOTE — Progress Notes (Unsigned)
Patient came in today for bilateral unna wraps. Patient wraps were discontinue for no swelling or no open wounds. Patient was recommended to continue with compression stockings daily and remove at night. Patient will follow up in six weeks with no studies.

## 2023-03-07 ENCOUNTER — Encounter (INDEPENDENT_AMBULATORY_CARE_PROVIDER_SITE_OTHER): Payer: Self-pay | Admitting: Nurse Practitioner

## 2023-03-11 ENCOUNTER — Other Ambulatory Visit (INDEPENDENT_AMBULATORY_CARE_PROVIDER_SITE_OTHER): Payer: Medicare Other

## 2023-03-11 DIAGNOSIS — R7989 Other specified abnormal findings of blood chemistry: Secondary | ICD-10-CM | POA: Diagnosis not present

## 2023-03-11 DIAGNOSIS — E039 Hypothyroidism, unspecified: Secondary | ICD-10-CM

## 2023-03-11 LAB — HEPATIC FUNCTION PANEL
ALT: 17 U/L (ref 0–35)
AST: 34 U/L (ref 0–37)
Albumin: 3.7 g/dL (ref 3.5–5.2)
Alkaline Phosphatase: 57 U/L (ref 39–117)
Bilirubin, Direct: 0.2 mg/dL (ref 0.0–0.3)
Total Bilirubin: 0.9 mg/dL (ref 0.2–1.2)
Total Protein: 6.8 g/dL (ref 6.0–8.3)

## 2023-03-11 LAB — TSH: TSH: 2.56 u[IU]/mL (ref 0.35–5.50)

## 2023-03-13 ENCOUNTER — Encounter (INDEPENDENT_AMBULATORY_CARE_PROVIDER_SITE_OTHER): Payer: Medicare Other

## 2023-03-20 ENCOUNTER — Telehealth: Payer: Self-pay | Admitting: Pulmonary Disease

## 2023-03-20 ENCOUNTER — Encounter (INDEPENDENT_AMBULATORY_CARE_PROVIDER_SITE_OTHER): Payer: Medicare Other

## 2023-03-20 NOTE — Telephone Encounter (Signed)
Dr. Jayme Cloud sent her in a script on 01/21/2023 with 5 refills. She should still have refills at her pharmacy.  ATC the patient. LVM for the patient to return my call.

## 2023-03-20 NOTE — Telephone Encounter (Signed)
Pt calling for a RX of Trelegy. She liked the sample given to her by Dr. Marisa Sprinkles is: [facility] [address] [employer]

## 2023-03-21 NOTE — Telephone Encounter (Signed)
I spoke with the patient. She said she got her refill today.  Nothing further needed.

## 2023-03-26 ENCOUNTER — Ambulatory Visit (INDEPENDENT_AMBULATORY_CARE_PROVIDER_SITE_OTHER): Payer: Medicare Other | Admitting: Dermatology

## 2023-03-26 ENCOUNTER — Other Ambulatory Visit (INDEPENDENT_AMBULATORY_CARE_PROVIDER_SITE_OTHER): Payer: Self-pay | Admitting: Nurse Practitioner

## 2023-03-26 VITALS — BP 169/82 | HR 78

## 2023-03-26 DIAGNOSIS — B078 Other viral warts: Secondary | ICD-10-CM

## 2023-03-26 DIAGNOSIS — Z8589 Personal history of malignant neoplasm of other organs and systems: Secondary | ICD-10-CM

## 2023-03-26 DIAGNOSIS — L820 Inflamed seborrheic keratosis: Secondary | ICD-10-CM | POA: Diagnosis not present

## 2023-03-26 DIAGNOSIS — M7989 Other specified soft tissue disorders: Secondary | ICD-10-CM

## 2023-03-26 DIAGNOSIS — L570 Actinic keratosis: Secondary | ICD-10-CM

## 2023-03-26 DIAGNOSIS — L821 Other seborrheic keratosis: Secondary | ICD-10-CM

## 2023-03-26 DIAGNOSIS — L578 Other skin changes due to chronic exposure to nonionizing radiation: Secondary | ICD-10-CM | POA: Diagnosis not present

## 2023-03-26 DIAGNOSIS — Z85828 Personal history of other malignant neoplasm of skin: Secondary | ICD-10-CM

## 2023-03-26 DIAGNOSIS — W908XXA Exposure to other nonionizing radiation, initial encounter: Secondary | ICD-10-CM | POA: Diagnosis not present

## 2023-03-26 NOTE — Progress Notes (Unsigned)
Follow-Up Visit   Subjective  Alexa Pham is a 89 y.o. female who presents for the following: The patient has spots, moles and lesions to be evaluated, some may be new or changing and the patient has concerns that these could be cancer.  The following portions of the chart were reviewed this encounter and updated as appropriate: medications, allergies, medical history  Review of Systems:  No other skin or systemic complaints except as noted in HPI or Assessment and Plan.  Objective  Well appearing patient in no apparent distress; mood and affect are within normal limits.  A focused examination was performed of the following areas:face,arms,hands   Relevant physical exam findings are noted in the Assessment and Plan.  left thumb x 1, right index finger PIP joint x 1 (2) Verrucous papules -- Discussed viral etiology and contagion.   suprasternal notch x 1, right forearm x 1 (2) Stuck-on, waxy, tan-brown papules --Discussed benign etiology and prognosis.   left forehead x 1, left temple x 1, right cheek x 2 (4) Erythematous thin papules/macules with gritty scale.    Assessment & Plan   Other viral warts (2) left thumb x 1, right index finger PIP joint x 1  Verrucous papules -- Discussed viral etiology and contagion.   Destruction of lesion - left thumb x 1, right index finger PIP joint x 1 Complexity: simple   Destruction method: cryotherapy   Informed consent: discussed and consent obtained   Timeout:  patient name, date of birth, surgical site, and procedure verified Lesion destroyed using liquid nitrogen: Yes   Region frozen until ice ball extended beyond lesion: Yes   Outcome: patient tolerated procedure well with no complications   Post-procedure details: wound care instructions given    Inflamed seborrheic keratosis (2) suprasternal notch x 1, right forearm x 1  Symptomatic, irritating, patient would like treated.   Destruction of lesion - suprasternal notch x  1, right forearm x 1 Complexity: simple   Destruction method: cryotherapy   Informed consent: discussed and consent obtained   Timeout:  patient name, date of birth, surgical site, and procedure verified Lesion destroyed using liquid nitrogen: Yes   Region frozen until ice ball extended beyond lesion: Yes   Outcome: patient tolerated procedure well with no complications   Post-procedure details: wound care instructions given    AK (actinic keratosis) (4) left forehead x 1, left temple x 1, right cheek x 2  Actinic keratoses are precancerous spots that appear secondary to cumulative UV radiation exposure/sun exposure over time. They are chronic with expected duration over 1 year. A portion of actinic keratoses will progress to squamous cell carcinoma of the skin. It is not possible to reliably predict which spots will progress to skin cancer and so treatment is recommended to prevent development of skin cancer.  Recommend daily broad spectrum sunscreen SPF 30+ to sun-exposed areas, reapply every 2 hours as needed.  Recommend staying in the shade or wearing long sleeves, sun glasses (UVA+UVB protection) and wide brim hats (4-inch brim around the entire circumference of the hat). Call for new or changing lesions.   Destruction of lesion - left forehead x 1, left temple x 1, right cheek x 2 Complexity: simple   Destruction method: cryotherapy   Informed consent: discussed and consent obtained   Timeout:  patient name, date of birth, surgical site, and procedure verified Lesion destroyed using liquid nitrogen: Yes   Region frozen until ice ball extended beyond lesion: Yes  Outcome: patient tolerated procedure well with no complications   Post-procedure details: wound care instructions given    SEBORRHEIC KERATOSIS - Stuck-on, waxy, tan-brown papules and/or plaques  - Benign-appearing - Discussed benign etiology and prognosis. - Observe - Call for any changes  ACTINIC DAMAGE -  chronic, secondary to cumulative UV radiation exposure/sun exposure over time - diffuse scaly erythematous macules with underlying dyspigmentation - Recommend daily broad spectrum sunscreen SPF 30+ to sun-exposed areas, reapply every 2 hours as needed.  - Recommend staying in the shade or wearing long sleeves, sun glasses (UVA+UVB protection) and wide brim hats (4-inch brim around the entire circumference of the hat). - Call for new or changing lesions.   HISTORY OF BASAL CELL CARCINOMA OF THE SKIN - No evidence of recurrence today - Recommend regular full body skin exams - Recommend daily broad spectrum sunscreen SPF 30+ to sun-exposed areas, reapply every 2 hours as needed.  - Call if any new or changing lesions are noted between office visits   HISTORY OF SQUAMOUS CELL CARCINOMA OF THE SKIN - No evidence of recurrence today - No lymphadenopathy - Recommend regular full body skin exams - Recommend daily broad spectrum sunscreen SPF 30+ to sun-exposed areas, reapply every 2 hours as needed.  - Call if any new or changing lesions are noted between office visits  Return in about 8 months (around 11/26/2023) for Aks, ISks, hx of skin cancer .  IAngelique Holm, CMA, am acting as scribe for Armida Sans, MD .   Documentation: I have reviewed the above documentation for accuracy and completeness, and I agree with the above.  Armida Sans, MD

## 2023-03-26 NOTE — Patient Instructions (Addendum)
Cryotherapy Aftercare  Wash gently with soap and water everyday.   Apply Vaseline and Band-Aid daily until healed.     Due to recent changes in healthcare laws, you may see results of your pathology and/or laboratory studies on MyChart before the doctors have had a chance to review them. We understand that in some cases there may be results that are confusing or concerning to you. Please understand that not all results are received at the same time and often the doctors may need to interpret multiple results in order to provide you with the best plan of care or course of treatment. Therefore, we ask that you please give us 2 business days to thoroughly review all your results before contacting the office for clarification. Should we see a critical lab result, you will be contacted sooner.   If You Need Anything After Your Visit  If you have any questions or concerns for your doctor, please call our main line at [phone] and press option 4 to reach your doctor's medical assistant. If no one answers, please leave a voicemail as directed and we will return your call as soon as possible. Messages left after 4 pm will be answered the following business day.   You may also send us a message via MyChart. We typically respond to MyChart messages within 1-2 business days.  For prescription refills, please ask your pharmacy to contact our office. Our fax number is [phone].  If you have an urgent issue when the clinic is closed that cannot wait until the next business day, you can page your doctor at the number below.    Please note that while we do our best to be available for urgent issues outside of office hours, we are not available 24/7.   If you have an urgent issue and are unable to reach us, you may choose to seek medical care at your doctor's office, retail clinic, urgent care center, or emergency room.  If you have a medical emergency, please immediately call 911 or go to the  emergency department.  Pager Numbers  - Dr. Kowalski: [phone]  - Dr. Moye: [phone]  - Dr. Stewart: [phone]  In the event of inclement weather, please call our main line at [phone] for an update on the status of any delays or closures.  Dermatology Medication Tips: Please keep the boxes that topical medications come in in order to help keep track of the instructions about where and how to use these. Pharmacies typically print the medication instructions only on the boxes and not directly on the medication tubes.   If your medication is too expensive, please contact our office at [phone] option 4 or send us a message through MyChart.   We are unable to tell what your co-pay for medications will be in advance as this is different depending on your insurance coverage. However, we may be able to find a substitute medication at lower cost or fill out paperwork to get insurance to cover a needed medication.   If a prior authorization is required to get your medication covered by your insurance company, please allow us 1-2 business days to complete this process.  Drug prices often vary depending on where the prescription is filled and some pharmacies may offer cheaper prices.  The website www.goodrx.com contains coupons for medications through different pharmacies. The prices here do not account for what the cost may be with help from insurance (it may be cheaper with your insurance), but the website can   give you the price if you did not use any insurance.  - You can print the associated coupon and take it with your prescription to the pharmacy.  - You may also stop by our office during regular business hours and pick up a GoodRx coupon card.  - If you need your prescription sent electronically to a different pharmacy, notify our office through [facility] MyChart or by phone at [phone] option 4.     Si Usted Necesita Algo Despus de Su Visita  Tambin puede  enviarnos un mensaje a travs de MyChart. Por lo general respondemos a los mensajes de MyChart en el transcurso de 1 a 2 das hbiles.  Para renovar recetas, por favor pida a su farmacia que se ponga en contacto con nuestra oficina. Nuestro nmero de fax es el [phone].  Si tiene un asunto urgente cuando la clnica est cerrada y que no puede esperar hasta el siguiente da hbil, puede llamar/localizar a su doctor(a) al nmero que aparece a continuacin.   Por favor, tenga en cuenta que aunque hacemos todo lo posible para estar disponibles para asuntos urgentes fuera del horario de oficina, no estamos disponibles las 24 horas del da, los 7 das de la semana.   Si tiene un problema urgente y no puede comunicarse con nosotros, puede optar por buscar atencin mdica  en el consultorio de su doctor(a), en una clnica privada, en un centro de atencin urgente o en una sala de emergencias.  Si tiene una emergencia mdica, por favor llame inmediatamente al 911 o vaya a la sala de emergencias.  Nmeros de bper  - Dr. Kowalski: [phone]  - Dra. Moye: [phone]  - Dra. Stewart: [phone]  En caso de inclemencias del tiempo, por favor llame a nuestra lnea principal al [phone] para una actualizacin sobre el estado de cualquier retraso o cierre.  Consejos para la medicacin en dermatologa: Por favor, guarde las cajas en las que vienen los medicamentos de uso tpico para ayudarle a seguir las instrucciones sobre dnde y cmo usarlos. Las farmacias generalmente imprimen las instrucciones del medicamento slo en las cajas y no directamente en los tubos del medicamento.   Si su medicamento es muy caro, por favor, pngase en contacto con nuestra oficina llamando al [phone] y presione la opcin 4 o envenos un mensaje a travs de MyChart.   No podemos decirle cul ser su copago por los medicamentos por adelantado ya que esto es diferente dependiendo de la cobertura de su seguro.  Sin embargo, es posible que podamos encontrar un medicamento sustituto a menor costo o llenar un formulario para que el seguro cubra el medicamento que se considera necesario.   Si se requiere una autorizacin previa para que su compaa de seguros cubra su medicamento, por favor permtanos de 1 a 2 das hbiles para completar este proceso.  Los precios de los medicamentos varan con frecuencia dependiendo del lugar de dnde se surte la receta y alguna farmacias pueden ofrecer precios ms baratos.  El sitio web www.goodrx.com tiene cupones para medicamentos de diferentes farmacias. Los precios aqu no tienen en cuenta lo que podra costar con la ayuda del seguro (puede ser ms barato con su seguro), pero el sitio web puede darle el precio si no utiliz ningn seguro.  - Puede imprimir el cupn correspondiente y llevarlo con su receta a la farmacia.  - Tambin puede pasar por nuestra oficina durante el horario de atencin regular y recoger una tarjeta de cupones de GoodRx.  -   Si necesita que su receta se enve electrnicamente a una farmacia diferente, informe a nuestra oficina a travs de MyChart de [facility] o por telfono llamando al [phone] y presione la opcin 4.  

## 2023-03-27 ENCOUNTER — Encounter: Payer: Self-pay | Admitting: Dermatology

## 2023-03-27 ENCOUNTER — Encounter (INDEPENDENT_AMBULATORY_CARE_PROVIDER_SITE_OTHER): Payer: Medicare Other

## 2023-03-27 ENCOUNTER — Ambulatory Visit (INDEPENDENT_AMBULATORY_CARE_PROVIDER_SITE_OTHER): Payer: Medicare Other | Admitting: Nurse Practitioner

## 2023-03-29 ENCOUNTER — Ambulatory Visit: Payer: Medicare Other | Admitting: Internal Medicine

## 2023-04-04 ENCOUNTER — Ambulatory Visit (INDEPENDENT_AMBULATORY_CARE_PROVIDER_SITE_OTHER): Payer: Medicare Other | Admitting: Internal Medicine

## 2023-04-04 ENCOUNTER — Encounter: Payer: Self-pay | Admitting: Internal Medicine

## 2023-04-04 VITALS — BP 130/68 | HR 89 | Temp 98.5°F | Ht 66.0 in | Wt 112.0 lb

## 2023-04-04 DIAGNOSIS — I2510 Atherosclerotic heart disease of native coronary artery without angina pectoris: Secondary | ICD-10-CM

## 2023-04-04 DIAGNOSIS — I272 Pulmonary hypertension, unspecified: Secondary | ICD-10-CM

## 2023-04-04 DIAGNOSIS — F439 Reaction to severe stress, unspecified: Secondary | ICD-10-CM

## 2023-04-04 DIAGNOSIS — I10 Essential (primary) hypertension: Secondary | ICD-10-CM

## 2023-04-04 DIAGNOSIS — J479 Bronchiectasis, uncomplicated: Secondary | ICD-10-CM

## 2023-04-04 DIAGNOSIS — I700 Atherosclerosis of aorta: Secondary | ICD-10-CM

## 2023-04-04 DIAGNOSIS — E039 Hypothyroidism, unspecified: Secondary | ICD-10-CM

## 2023-04-04 DIAGNOSIS — E7800 Pure hypercholesterolemia, unspecified: Secondary | ICD-10-CM

## 2023-04-04 DIAGNOSIS — Z853 Personal history of malignant neoplasm of breast: Secondary | ICD-10-CM

## 2023-04-04 DIAGNOSIS — R600 Localized edema: Secondary | ICD-10-CM | POA: Diagnosis not present

## 2023-04-04 DIAGNOSIS — N2889 Other specified disorders of kidney and ureter: Secondary | ICD-10-CM

## 2023-04-04 MED ORDER — LEVOTHYROXINE SODIUM 150 MCG PO TABS: 150.0000 ug | ORAL_TABLET | Freq: Every day | ORAL | 0 refills | Status: DC

## 2023-04-04 MED ORDER — IBANDRONATE SODIUM 150 MG PO TABS: 150.0000 mg | ORAL_TABLET | ORAL | 0 refills | Status: DC

## 2023-04-04 MED ORDER — CLOPIDOGREL BISULFATE 75 MG PO TABS: 75.0000 mg | ORAL_TABLET | Freq: Every day | ORAL | 1 refills | Status: DC

## 2023-04-04 MED ORDER — ATORVASTATIN CALCIUM 40 MG PO TABS: ORAL_TABLET | ORAL | 1 refills | Status: DC

## 2023-04-04 NOTE — Progress Notes (Signed)
Subjective:    [name], female    DOB: Jun 29, 1930, 89 y.o.   MRN: [id number]  Patient here for  Chief Complaint  Patient presents with   Medical Management of Chronic Issues    3 month follow up     HPI Here to follow up regarding hypertension, lower extremity edema and bronchiectasis.  Recently had issues with lower extremity swelling.  Taking diuretic daily now. Unna wraps.. Swelling is better.  Using trelegy regularly.  Breathing is stable.  No increased cough or congestion.  No abdominal pain or bowel change.  Bowels regular and formed with fiber.     Past Medical History:  Diagnosis Date   Actinic keratosis 01/28/2020   R thumb proximal nail fold    Atypical mole 05/13/2018   R distal lat thigh   Atypical mole 09/14/2014   left ant mid thigh   Atypical mole 01/19/2014   left paraspinal mid to upper back   Atypical mole 01/19/2014   right back mid to inf scapula   Atypical mole 01/19/2014   left posterior deltoid medial   Basal cell carcinoma 01/28/2020   left of midline mid dorsum nose   Basal cell carcinoma 01/06/2018   left preauricular    Basal cell carcinoma 11/22/2016   left upper lip   Basal cell carcinoma 03/17/2015   left lateral elbow   Basal cell carcinoma 07/22/2014   R medial inferior knee   Basal cell carcinoma 07/21/2020   Left dorsum hand. Nodular pattern. Excised 08/09/2020, margins free.   Basal cell carcinoma 02/23/2021   right dorsal hand (BCC/SCC), left dorsal hand, right chin - treated with EDC   Family history of breast cancer    History of breast cancer    History of pneumonia    Hypertension    Hypothyroidism    Macular degeneration    Squamous cell carcinoma of skin 01/06/2018   right dorsum hand at ring finger MCP   Squamous cell carcinoma of skin 07/08/2017   left distal dorsum forearm    Past Surgical History:  Procedure Laterality Date   ABDOMINAL HYSTERECTOMY     ABDOMINAL HYSTERECTOMY     APPENDECTOMY      BREAST LUMPECTOMY     CATARACT EXTRACTION, BILATERAL     CHOLECYSTECTOMY     CORONARY STENT INTERVENTION N/A 07/26/2020   Procedure: CORONARY STENT INTERVENTION;  Surgeon: Marcina Millard, MD;  Location: [facility];  Service: Cardiovascular;  Laterality: N/A;   LEFT HEART CATH AND CORONARY ANGIOGRAPHY Left 07/26/2020   Procedure: LEFT HEART CATH AND CORONARY ANGIOGRAPHY;  Surgeon: Lamar Blinks, MD;  Location: [facility];  Service: Cardiovascular;  Laterality: Left;   REPLACEMENT TOTAL KNEE     TONSILLECTOMY AND ADENOIDECTOMY     Family History  Problem Relation Age of Onset   CAD Mother    Cancer Maternal Aunt        unk type   Leukemia Maternal Grandmother    Breast cancer Half-Sister        dx 54s   Social History   Socioeconomic History   Marital status: Widowed    Spouse name: Not on file   Number of children: Not on file   Years of education: Not on file   Highest education level: Not on file  Occupational History   Not on file  Tobacco Use   Smoking status: Former    Packs/day: 1.50    Years: 6.00  Additional pack years: 0.00    Total pack years: 9.00    Types: Cigarettes    Quit date: 72    Years since quitting: 49.5   Smokeless tobacco: Never  Substance and Sexual Activity   Alcohol use: No    Alcohol/week: 0.0 standard drinks of alcohol   Drug use: No   Sexual activity: Not on file  Other Topics Concern   Not on file  Social History Narrative   Married    Social Determinants of Health   Financial Resource Strain: Low Risk  (10/22/2022)   Overall Financial Resource Strain (CARDIA)    Difficulty of Paying Living Expenses: Not hard at all  Food Insecurity: No Food Insecurity (10/22/2022)   Hunger Vital Sign    Worried About Running Out of Food in the Last Year: Never true    Ran Out of Food in the Last Year: Never true  Transportation Needs: No Transportation Needs (10/22/2022)   PRAPARE - Scientist, research (physical sciences) (Medical): No    Lack of Transportation (Non-Medical): No  Physical Activity: Insufficiently Active (10/22/2022)   Exercise Vital Sign    Days of Exercise per Week: 2 days    Minutes of Exercise per Session: 40 min  Stress: No Stress Concern Present (10/22/2022)   [employer] of Occupational Health - Occupational Stress Questionnaire    Feeling of Stress : Not at all  Social Connections: Moderately Integrated (10/22/2022)   Social Connection and Isolation Panel [NHANES]    Frequency of Communication with Friends and Family: More than three times a week    Frequency of Social Gatherings with Friends and Family: More than three times a week    Attends Religious Services: More than 4 times per year    Active Member of [employer] or Organizations: Yes    Attends Banker Meetings: More than 4 times per year    Marital Status: Widowed     Review of Systems  Constitutional:  Negative for appetite change and unexpected weight change.  HENT:  Negative for congestion and sinus pressure.   Respiratory:  Negative for cough and chest tightness.        Breathing stable.   Cardiovascular:  Negative for chest pain and palpitations.       Leg swelling improved.    Gastrointestinal:  Negative for abdominal pain, diarrhea, nausea and vomiting.  Genitourinary:  Negative for difficulty urinating and dysuria.  Musculoskeletal:  Negative for joint swelling and myalgias.  Skin:  Negative for color change and rash.  Neurological:  Negative for dizziness and headaches.  Psychiatric/Behavioral:  Negative for agitation and dysphoric mood.        Objective:     BP 130/68   Pulse 89   Temp 98.5 F (36.9 C) (Oral)   Ht 5\' 6"  (1.676 m)   Wt 112 lb (50.8 kg)   SpO2 94%   BMI 18.08 kg/m  Wt Readings from Last 3 Encounters:  04/04/23 112 lb (50.8 kg)  03/06/23 109 lb 9.6 oz (49.7 kg)  02/28/23 110 lb 9.6 oz (50.2 kg)    Physical Exam Vitals reviewed.  Constitutional:       General: She is not in acute distress.    Appearance: Normal appearance.  HENT:     Head: Normocephalic and atraumatic.     Right Ear: External ear normal.     Left Ear: External ear normal.  Eyes:     General: No scleral icterus.  Right eye: No discharge.        Left eye: No discharge.     Conjunctiva/sclera: Conjunctivae normal.  Neck:     Thyroid: No thyromegaly.  Cardiovascular:     Rate and Rhythm: Normal rate and regular rhythm.  Pulmonary:     Effort: No respiratory distress.     Breath sounds: Normal breath sounds. No wheezing.  Abdominal:     General: Bowel sounds are normal.     Palpations: Abdomen is soft.     Tenderness: There is no abdominal tenderness.  Musculoskeletal:        General: No swelling or tenderness.     Cervical back: Neck supple. No tenderness.  Lymphadenopathy:     Cervical: No cervical adenopathy.  Skin:    Findings: No erythema or rash.  Neurological:     Mental Status: She is alert.  Psychiatric:        Mood and Affect: Mood normal.        Behavior: Behavior normal.      Outpatient Encounter Medications as of 04/04/2023  Medication Sig   amLODipine (NORVASC) 2.5 MG tablet Take 2.5 mg by mouth 2 (two) times daily.   aspirin EC 81 MG EC tablet Take 1 tablet (81 mg total) by mouth daily. Swallow whole.   calcium citrate-vitamin D (CITRACAL+D) 315-200 MG-UNIT tablet Take 1 tablet by mouth 2 (two) times daily.   carvedilol (COREG) 6.25 MG tablet Take 6.25 mg by mouth 2 (two) times daily with a meal.   Fluticasone-Umeclidin-Vilant (TRELEGY ELLIPTA) 100-62.5-25 MCG/ACT AEPB Inhale 1 puff into the lungs daily.   furosemide (LASIX) 20 MG tablet Take by mouth.   irbesartan (AVAPRO) 150 MG tablet Take 1 tablet (150 mg total) by mouth daily. (Patient taking differently: Take 150 mg by mouth in the morning and at bedtime.)   Multiple Vitamin (MULTIVITAMIN) capsule Take 1 capsule by mouth daily.   Multiple Vitamins-Minerals (PRESERVISION  AREDS 2 PO) Take 1 tablet by mouth 2 (two) times daily.    Vibegron (GEMTESA) 75 MG TABS Take 75 mg by mouth daily.   [DISCONTINUED] atorvastatin (LIPITOR) 40 MG tablet TAKE 1 TABLET(40 MG) BY MOUTH DAILY   [DISCONTINUED] clopidogrel (PLAVIX) 75 MG tablet Take 1 tablet (75 mg total) by mouth daily.   [DISCONTINUED] ibandronate (BONIVA) 150 MG tablet Take 1 tablet (150 mg total) by mouth every 30 (thirty) days. Take in the morning with a full glass of water, on an empty stomach, and do not take anything else by mouth or lie down for the next 30 min.   [DISCONTINUED] levothyroxine (SYNTHROID) 150 MCG tablet Take 1 tablet (150 mcg total) by mouth daily.   atorvastatin (LIPITOR) 40 MG tablet TAKE 1 TABLET(40 MG) BY MOUTH DAILY   clopidogrel (PLAVIX) 75 MG tablet Take 1 tablet (75 mg total) by mouth daily.   ibandronate (BONIVA) 150 MG tablet Take 1 tablet (150 mg total) by mouth every 30 (thirty) days. Take in the morning with a full glass of water, on an empty stomach, and do not take anything else by mouth or lie down for the next 30 min.   levothyroxine (SYNTHROID) 150 MCG tablet Take 1 tablet (150 mcg total) by mouth daily.   [DISCONTINUED] albuterol (VENTOLIN HFA) 108 (90 Base) MCG/ACT inhaler Inhale 2 puffs into the lungs every 6 (six) hours as needed for wheezing or shortness of breath. (Patient not taking: Reported on 04/04/2023)   [DISCONTINUED] Fluticasone-Umeclidin-Vilant (TRELEGY ELLIPTA) 100-62.5-25 MCG/ACT AEPB Inhale 1 puff  into the lungs daily. (Patient not taking: Reported on 04/04/2023)   No facility-administered encounter medications on file as of 04/04/2023.     Lab Results  Component Value Date   WBC 7.6 10/04/2022   HGB 14.0 10/04/2022   HCT 41.7 10/04/2022   PLT 365 10/04/2022   GLUCOSE 88 01/17/2023   CHOL 135 01/17/2023   TRIG 59.0 01/17/2023   HDL 59.00 01/17/2023   LDLCALC 64 01/17/2023   ALT 17 03/11/2023   AST 34 03/11/2023   NA 143 01/17/2023   K 3.6 01/17/2023    CL 96 01/17/2023   CREATININE 0.91 01/17/2023   BUN 17 01/17/2023   CO2 38 (H) 01/17/2023   TSH 2.56 03/11/2023   INR 1.1 02/13/2022   HGBA1C 5.6 02/13/2022    CT Abdomen W Contrast  Result Date: 11/05/2022 CLINICAL DATA:  Left renal mass.  Follow-up. * Tracking Code: BO * EXAM: CT ABDOMEN WITH CONTRAST TECHNIQUE: Multidetector CT imaging of the abdomen was performed using the standard protocol following bolus administration of intravenous contrast. RADIATION DOSE REDUCTION: This exam was performed according to the departmental dose-optimization program which includes automated exposure control, adjustment of the mA and/or kV according to patient size and/or use of iterative reconstruction technique. CONTRAST:  80mL OMNIPAQUE IOHEXOL 300 MG/ML  SOLN COMPARISON:  Ultrasound 06/29/2022 and CT of 01/12/2022 FINDINGS: Lower chest: Right middle lobe volume loss and presumably postinfectious/inflammatory scarring with bronchiectasis. Cardiomegaly, without pericardial or pleural effusion. Lad coronary artery calcification. Hepatobiliary: Normal liver. Cholecystectomy, without biliary ductal dilatation. Pancreas: Normal, without mass or ductal dilatation. Spleen: Normal in size, without focal abnormality. Adrenals/Urinary Tract: Mild left adrenal thickening. Normal right adrenal gland and right kidney. Left renal too small to characterize lesions. Exophytic off the posterolateral interpolar left kidney is a heterogeneous solid mass which measures 3.7 x 3.2 cm on 21/7. Compare 3.3 x 3.1 cm on the prior exam when remeasured in a similar fashion. 4.2 cm craniocaudal on coronal image 49/5 versus 3.8 cm on the prior exam when remeasured in a similar fashion. No hydronephrosis. Stomach/Bowel: Proximal gastric underdistention. Normal abdominal bowel loops. Vascular/Lymphatic: Aortic atherosclerosis. Patent renal veins. Left periaortic index node is similar at 1.0 cm in 30/2. Other: No ascites. No free intraperitoneal  air. Anterior abdominal wall subcutaneous nodularity is nonspecific but may be iatrogenic. Musculoskeletal: Disc bulges throughout the lumbar spine. IMPRESSION: 1. Since 01/12/2022, mild enlargement of a 4.2 cm left renal cell carcinoma. 2. No renal vein involvement. 3. Similar borderline abdominal retroperitoneal adenopathy. 4. Incidental findings, including: Coronary artery atherosclerosis. Aortic Atherosclerosis (ICD10-I70.0). Electronically Signed   By: Jeronimo Greaves M.D.   On: 11/05/2022 11:16       Assessment & Plan:  Aortic atherosclerosis (HCC) Assessment & Plan: Continue lipitor.    Bilateral lower extremity edema Assessment & Plan: Improved.  Continues monitoring daily weights and po fluid/sodium intake.  Unna wraps.  Follow    Bronchiectasis without complication [facility]) Assessment & Plan: Recent evaluation by pulmonary.  Had pulmonary function tests 12/2022 - findings are consistent mostly with mild obstructive airways disease with reversible component as seen in asthma. Was started on trelegy and instructed on how to use. Also started on overnight oxygen - 2LNC. Overall she feels breathing is stable.    Coronary artery disease involving native coronary artery of native heart without angina pectoris Assessment & Plan: Previous admission for NSTEMI.  ECHO with no wall motion abnormality.  EF 60%.  Recommended dual antiplatelet therapy and statin medication.  No chest pain. Breathing stable. Swelling improved.  Follow.    History of breast cancer Assessment & Plan: S/p lumpectomy.  Declines mammogram.    Hypercholesterolemia Assessment & Plan: On lipitor. Follow lipid panel and liver function tests.    Primary hypertension Assessment & Plan: Currently on avapro, coreg and amlodipine.  Monitors daily weight.  Taking lasix daily.  Follow.    Hypothyroidism, unspecified type Assessment & Plan: On synthroid.  Follow tsh.    Kidney mass Assessment & Plan: 10/2022 - Dr  Apolinar Junes - The left renal mass is in the region somewhat, overall growth rate is fairly minimal dating back to 22 months ago when first identified, on average 3 mm annually.  Recommended f/u scan one year.    Stress Assessment & Plan: Overall appears to be handling stress relatively well.     Pulmonary hypertension (HCC) Assessment & Plan: Follow up with cardiology.  Mention of right hear cath.  Breathing stable.    Other orders -     Atorvastatin Calcium; TAKE 1 TABLET(40 MG) BY MOUTH DAILY  Dispense: 90 tablet; Refill: 1 -     Clopidogrel Bisulfate; Take 1 tablet (75 mg total) by mouth daily.  Dispense: 90 tablet; Refill: 1 -     Ibandronate Sodium; Take 1 tablet (150 mg total) by mouth every 30 (thirty) days. Take in the morning with a full glass of water, on an empty stomach, and do not take anything else by mouth or lie down for the next 30 min.  Dispense: 12 tablet; Refill: 0 -     Levothyroxine Sodium; Take 1 tablet (150 mcg total) by mouth daily.  Dispense: 90 tablet; Refill: 0     Dale [place], MD

## 2023-04-07 ENCOUNTER — Encounter: Payer: Self-pay | Admitting: Internal Medicine

## 2023-04-07 DIAGNOSIS — I272 Pulmonary hypertension, unspecified: Secondary | ICD-10-CM | POA: Insufficient documentation

## 2023-04-07 NOTE — Assessment & Plan Note (Signed)
Follow up with cardiology.  Mention of right hear cath.  Breathing stable.

## 2023-04-07 NOTE — Assessment & Plan Note (Signed)
On synthroid.  Follow tsh.   

## 2023-04-07 NOTE — Assessment & Plan Note (Signed)
Currently on avapro, coreg and amlodipine.  Monitors daily weight.  Taking lasix daily.  Follow.

## 2023-04-07 NOTE — Assessment & Plan Note (Signed)
10/2022 - Dr Brandon - The left renal mass is in the region somewhat, overall growth rate is fairly minimal dating back to 22 months ago when first identified, on average 3 mm annually.  Recommended f/u scan one year.  

## 2023-04-07 NOTE — Assessment & Plan Note (Signed)
On lipitor.  Follow lipid panel and liver function tests.   

## 2023-04-07 NOTE — Assessment & Plan Note (Signed)
S/p lumpectomy.  Declines mammogram.  

## 2023-04-07 NOTE — Assessment & Plan Note (Signed)
Overall appears to be handling stress relatively well.   

## 2023-04-07 NOTE — Assessment & Plan Note (Signed)
Recent evaluation by pulmonary.  Had pulmonary function tests 12/2022 - findings are consistent mostly with mild obstructive airways disease with reversible component as seen in asthma. Was started on trelegy and instructed on how to use. Also started on overnight oxygen - 2LNC. Overall she feels breathing is stable.

## 2023-04-07 NOTE — Assessment & Plan Note (Addendum)
Improved.  Continues monitoring daily weights and po fluid/sodium intake.  Unna wraps.  Follow

## 2023-04-07 NOTE — Assessment & Plan Note (Signed)
Previous admission for NSTEMI.  ECHO with no wall motion abnormality.  EF 60%.  Recommended dual antiplatelet therapy and statin medication.  No chest pain. Breathing stable. Swelling improved.  Follow.

## 2023-04-07 NOTE — Assessment & Plan Note (Signed)
Continue lipitor  ?

## 2023-04-08 ENCOUNTER — Ambulatory Visit (INDEPENDENT_AMBULATORY_CARE_PROVIDER_SITE_OTHER): Payer: Medicare Other | Admitting: Primary Care

## 2023-04-08 ENCOUNTER — Encounter: Payer: Self-pay | Admitting: Primary Care

## 2023-04-08 VITALS — BP 118/60 | HR 67 | Temp 97.9°F | Ht 66.0 in | Wt 106.0 lb

## 2023-04-08 DIAGNOSIS — J479 Bronchiectasis, uncomplicated: Secondary | ICD-10-CM

## 2023-04-08 DIAGNOSIS — G4734 Idiopathic sleep related nonobstructive alveolar hypoventilation: Secondary | ICD-10-CM | POA: Insufficient documentation

## 2023-04-08 DIAGNOSIS — I272 Pulmonary hypertension, unspecified: Secondary | ICD-10-CM

## 2023-04-08 NOTE — Patient Instructions (Addendum)
Recommendations: Continue Trelegy one puff daily  Continue to wear oxygen 2L at bedtime Start nebulizer twice daily as needed to loosen congestion  Stay as active as possible Notify office if you develop colored mucus, fevers or increased shortness of breath   Follow-up: 3 months with Dr. Jayme Cloud

## 2023-04-08 NOTE — Progress Notes (Signed)
@Patient  ID: Alexa Pham, female    DOB: 18-Nov-1929, 89 y.o.   MRN: [id number]  Chief Complaint  Patient presents with   Follow-up    SOB with exertion.     Referring provider: Dale Sharon, MD  HPI: 89 year old female, former smoker. PMH significant for pulmonary hypertension, nocturnal hypoxia, bronchiectasis. Patient of Dr. Jayme Cloud.   04/08/2023 She is at her baseline breathing wise. She feels she is doing pretty well. No changes since last OV. She is using Trelegy daily. No rescue inhaler need. She experience dyspnea with activites but is not limited by her breathing. She tells me that she can do almost all ADL but will need to rest. She does her own grocery shopping. She can walk down the aisles with a push cart. Feels she gets along fairly well, wants to get more exercise. She has a Psychologist, educational at the country [employer] that she sees twice a week. She is working on building upper arm strength. No chest tightness or wheezing. Very rare cough, no mucus production. She has been prescribed hypertonic saline nebulizers but has not received. O2 during the day at home remaining in the 90s. She wears 2L oxygen at night.    Allergies  Allergen Reactions   Ticagrelor Shortness Of Breath    Extreme     Immunization History  Administered Date(s) Administered   Fluad Quad(high Dose 65+) 06/14/2019, 06/10/2020, 09/21/2021   Influenza, High Dose Seasonal PF 07/04/2015, 07/02/2017, 07/03/2018   Influenza-Unspecified 06/22/2016, 07/08/2022   PFIZER Comirnaty(Gray Top)Covid-19 Tri-Sucrose Vaccine 11/06/2019, 11/27/2019, 07/11/2020   PFIZER(Purple Top)SARS-COV-2 Vaccination 11/06/2019, 11/27/2019   Pneumococcal Conjugate-13 04/21/2016   Pneumococcal Polysaccharide-23 05/05/2012   Tdap 11/09/2014    Past Medical History:  Diagnosis Date   Actinic keratosis 01/28/2020   R thumb proximal nail fold    Atypical mole 05/13/2018   R distal lat thigh   Atypical mole 09/14/2014   left ant mid thigh    Atypical mole 01/19/2014   left paraspinal mid to upper back   Atypical mole 01/19/2014   right back mid to inf scapula   Atypical mole 01/19/2014   left posterior deltoid medial   Basal cell carcinoma 01/28/2020   left of midline mid dorsum nose   Basal cell carcinoma 01/06/2018   left preauricular    Basal cell carcinoma 11/22/2016   left upper lip   Basal cell carcinoma 03/17/2015   left lateral elbow   Basal cell carcinoma 07/22/2014   R medial inferior knee   Basal cell carcinoma 07/21/2020   Left dorsum hand. Nodular pattern. Excised 08/09/2020, margins free.   Basal cell carcinoma 02/23/2021   right dorsal hand (BCC/SCC), left dorsal hand, right chin - treated with EDC   Family history of breast cancer    History of breast cancer    History of pneumonia    Hypertension    Hypothyroidism    Macular degeneration    Squamous cell carcinoma of skin 01/06/2018   right dorsum hand at ring finger MCP   Squamous cell carcinoma of skin 07/08/2017   left distal dorsum forearm     Tobacco History: Social History   Tobacco Use  Smoking Status Former   Packs/day: 1.50   Years: 6.00   Additional pack years: 0.00   Total pack years: 9.00   Types: Cigarettes   Quit date: 1975   Years since quitting: 49.5  Smokeless Tobacco Never   Counseling given: Not Answered   Outpatient Medications Prior to  Visit  Medication Sig Dispense Refill   amLODipine (NORVASC) 2.5 MG tablet Take 2.5 mg by mouth 2 (two) times daily.     aspirin EC 81 MG EC tablet Take 1 tablet (81 mg total) by mouth daily. Swallow whole. 30 tablet 0   atorvastatin (LIPITOR) 40 MG tablet TAKE 1 TABLET(40 MG) BY MOUTH DAILY 90 tablet 1   calcium citrate-vitamin D (CITRACAL+D) 315-200 MG-UNIT tablet Take 1 tablet by mouth 2 (two) times daily.     carvedilol (COREG) 6.25 MG tablet Take 6.25 mg by mouth 2 (two) times daily with a meal.     clopidogrel (PLAVIX) 75 MG tablet Take 1 tablet (75 mg total) by mouth  daily. 90 tablet 1   Fluticasone-Umeclidin-Vilant (TRELEGY ELLIPTA) 100-62.5-25 MCG/ACT AEPB Inhale 1 puff into the lungs daily. 60 each 5   furosemide (LASIX) 20 MG tablet Take by mouth.     ibandronate (BONIVA) 150 MG tablet Take 1 tablet (150 mg total) by mouth every 30 (thirty) days. Take in the morning with a full glass of water, on an empty stomach, and do not take anything else by mouth or lie down for the next 30 min. 12 tablet 0   irbesartan (AVAPRO) 150 MG tablet Take 1 tablet (150 mg total) by mouth daily. (Patient taking differently: Take 150 mg by mouth in the morning and at bedtime.) 30 tablet 11   levothyroxine (SYNTHROID) 150 MCG tablet Take 1 tablet (150 mcg total) by mouth daily. 90 tablet 0   Multiple Vitamins-Minerals (PRESERVISION AREDS 2 PO) Take 1 tablet by mouth 2 (two) times daily.      Vibegron (GEMTESA) 75 MG TABS Take 75 mg by mouth daily. 90 tablet 2   Multiple Vitamin (MULTIVITAMIN) capsule Take 1 capsule by mouth daily.     No facility-administered medications prior to visit.   Review of Systems  Review of Systems  Constitutional: Negative.   HENT: Negative.    Respiratory: Negative.    Cardiovascular: Negative.    Physical Exam  BP 118/60 (BP Location: Left Arm, Cuff Size: Normal)   Pulse 67   Temp 97.9 F (36.6 C) (Temporal)   Ht 5\' 6"  (1.676 m)   Wt 106 lb (48.1 kg) Comment: per patient  SpO2 95%   BMI 17.11 kg/m  Physical Exam Constitutional:      Appearance: Normal appearance. She is not ill-appearing.  HENT:     Head: Normocephalic and atraumatic.     Mouth/Throat:     Mouth: Mucous membranes are moist.     Pharynx: Oropharynx is clear.  Cardiovascular:     Rate and Rhythm: Normal rate and regular rhythm.  Pulmonary:     Effort: Pulmonary effort is normal.     Breath sounds: Normal breath sounds. No wheezing or rhonchi.     Comments: CTA Musculoskeletal:        General: Normal range of motion.  Skin:    General: Skin is warm and  dry.  Neurological:     General: No focal deficit present.     Mental Status: She is alert and oriented to person, place, and time. Mental status is at baseline.  Psychiatric:        Mood and Affect: Mood normal.        Behavior: Behavior normal.        Thought Content: Thought content normal.        Judgment: Judgment normal.      Lab Results:  CBC  Component Value Date/Time   WBC 7.6 10/04/2022 1441   RBC 4.42 10/04/2022 1441   HGB 14.0 10/04/2022 1441   HCT 41.7 10/04/2022 1441   PLT 365 10/04/2022 1441   MCV 94.3 10/04/2022 1441   MCH 31.7 10/04/2022 1441   MCHC 33.6 10/04/2022 1441   RDW 13.2 10/04/2022 1441   LYMPHSABS 1.5 10/04/2022 1441   MONOABS 0.9 10/04/2022 1441   EOSABS 0.1 10/04/2022 1441   BASOSABS 0.0 10/04/2022 1441    BMET    Component Value Date/Time   NA 143 01/17/2023 0900   K 3.6 01/17/2023 0900   CL 96 01/17/2023 0900   CO2 38 (H) 01/17/2023 0900   GLUCOSE 88 01/17/2023 0900   BUN 17 01/17/2023 0900   CREATININE 0.91 01/17/2023 0900   CREATININE 0.81 01/02/2019 1646   CALCIUM 9.5 01/17/2023 0900   GFRNONAA 49 (L) 10/04/2022 1441   GFRAA >60 05/05/2020 0433    BNP    Component Value Date/Time   BNP 178.4 (H) 05/04/2020 0806    ProBNP No results found for: "PROBNP"  Imaging: No results found.   Assessment & Plan:   Bronchiectasis without complication (HCC) - Well compensated; No recent flare-ups.  Pulmonary function testing shows mild obstruction with reversibility. Lungs clear on exam. Continue Trelegy Ellipta 1 puff daily. Awaiting nebulizer. Patient advised to use hypertonic saline twice daily as needed for cough/loosen congestion.   Pulmonary hypertension (HCC) - Following with cardiology/[facility] - Continue nocturnal oxygen, low sodium diet and diuretics   Nocturnal hypoxia - Felt to be due to pulmonary hypertension and mild obstructive lung disease  - No daytime requirements, O2 stayed above 95% RA p walking  231ft  - Continue 2L oxygen at bedtime   Glenford Bayley, NP 04/08/2023

## 2023-04-08 NOTE — Assessment & Plan Note (Addendum)
-   Well compensated; No recent flare-ups.  Pulmonary function testing shows mild obstruction with reversibility. Lungs clear on exam. Continue Trelegy Ellipta 1 puff daily. Awaiting nebulizer. Patient advised to use hypertonic saline twice daily as needed for cough/loosen congestion.

## 2023-04-08 NOTE — Assessment & Plan Note (Addendum)
-   Felt to be due to pulmonary hypertension and mild obstructive lung disease  - No daytime requirements, O2 stayed above 95% RA p walking 26ft  - Continue 2L oxygen at bedtime

## 2023-04-08 NOTE — Assessment & Plan Note (Addendum)
-   Following with cardiology/[facility] - Continue nocturnal oxygen, low sodium diet and diuretics

## 2023-04-17 ENCOUNTER — Ambulatory Visit (INDEPENDENT_AMBULATORY_CARE_PROVIDER_SITE_OTHER): Payer: Medicare Other

## 2023-04-17 ENCOUNTER — Ambulatory Visit (INDEPENDENT_AMBULATORY_CARE_PROVIDER_SITE_OTHER): Payer: Medicare Other | Admitting: Nurse Practitioner

## 2023-04-17 ENCOUNTER — Encounter (INDEPENDENT_AMBULATORY_CARE_PROVIDER_SITE_OTHER): Payer: Self-pay | Admitting: Nurse Practitioner

## 2023-04-17 VITALS — BP 171/87 | HR 71 | Resp 18 | Ht 66.0 in | Wt 111.2 lb

## 2023-04-17 DIAGNOSIS — M7989 Other specified soft tissue disorders: Secondary | ICD-10-CM

## 2023-04-17 DIAGNOSIS — I10 Essential (primary) hypertension: Secondary | ICD-10-CM

## 2023-04-17 DIAGNOSIS — E7800 Pure hypercholesterolemia, unspecified: Secondary | ICD-10-CM | POA: Diagnosis not present

## 2023-04-17 NOTE — Progress Notes (Signed)
Subjective:    [name], female    DOB: 07/16/1930, 89 y.o.   MRN: [id number] Chief Complaint  Patient presents with   Follow-up    Follow up- BLE reflux    The patient is a 89 year old female who presents today as a referral from Dr. Lorin Picket in regards to bilateral lower extremity edema.  The patient notes that the swelling became drastically worse after a recent cruise.  She notes that during this timeframe she was doing extensive amounts of sitting and waiting in the airport terminals.  Since she returned she seen her pulmonologist and cardiologist who have instituted medication changes including introduction of diuretics.  The patient initially had swelling up to her thighs and she had about a noted 35 pound weight gain.  At her office visit last time she continued to have swelling and was placed in bilateral Unna boots for about 2 weeks.  She notes that this helped her swelling drastically.  She notes that since removing the Unna boot she has been able to transition the back to medical grade compression stockings which she wears daily.  She also elevates her lower extremities as possible.  She notes that she takes a fluid pill if she notes any swelling and thus far she has been doing very well with control of her edema.  Today noninvasive study showed no evidence of DVT or superficial thrombophlebitis bilaterally.  No evidence of deep venous insufficiency or superficial venous reflux bilaterally.    Review of Systems  Cardiovascular:  Positive for leg swelling.  All other systems reviewed and are negative.      Objective:   Physical Exam Vitals reviewed.  HENT:     Head: Normocephalic.  Cardiovascular:     Rate and Rhythm: Normal rate.     Pulses: Normal pulses.  Pulmonary:     Effort: Pulmonary effort is normal.  Musculoskeletal:     Right lower leg: No edema.     Left lower leg: No edema.  Skin:    General: Skin is warm and dry.  Neurological:     Mental  Status: She is alert and oriented to person, place, and time.  Psychiatric:        Mood and Affect: Mood normal.        Behavior: Behavior normal.        Thought Content: Thought content normal.        Judgment: Judgment normal.     BP (!) 171/87 (BP Location: Left Arm)   Pulse 71   Resp 18   Ht 5\' 6"  (1.676 m)   Wt 111 lb 3.2 oz (50.4 kg)   BMI 17.95 kg/m   Past Medical History:  Diagnosis Date   Actinic keratosis 01/28/2020   R thumb proximal nail fold    Atypical mole 05/13/2018   R distal lat thigh   Atypical mole 09/14/2014   left ant mid thigh   Atypical mole 01/19/2014   left paraspinal mid to upper back   Atypical mole 01/19/2014   right back mid to inf scapula   Atypical mole 01/19/2014   left posterior deltoid medial   Basal cell carcinoma 01/28/2020   left of midline mid dorsum nose   Basal cell carcinoma 01/06/2018   left preauricular    Basal cell carcinoma 11/22/2016   left upper lip   Basal cell carcinoma 03/17/2015   left lateral elbow   Basal cell carcinoma 07/22/2014   R medial inferior knee  Basal cell carcinoma 07/21/2020   Left dorsum hand. Nodular pattern. Excised 08/09/2020, margins free.   Basal cell carcinoma 02/23/2021   right dorsal hand (BCC/SCC), left dorsal hand, right chin - treated with EDC   Family history of breast cancer    History of breast cancer    History of pneumonia    Hypertension    Hypothyroidism    Macular degeneration    Squamous cell carcinoma of skin 01/06/2018   right dorsum hand at ring finger MCP   Squamous cell carcinoma of skin 07/08/2017   left distal dorsum forearm     Social History   Socioeconomic History   Marital status: Widowed    Spouse name: Not on file   Number of children: Not on file   Years of education: Not on file   Highest education level: Not on file  Occupational History   Not on file  Tobacco Use   Smoking status: Former    Packs/day: 1.50    Years: 6.00    Additional pack  years: 0.00    Total pack years: 9.00    Types: Cigarettes    Quit date: 46    Years since quitting: 49.5   Smokeless tobacco: Never  Substance and Sexual Activity   Alcohol use: No    Alcohol/week: 0.0 standard drinks of alcohol   Drug use: No   Sexual activity: Not on file  Other Topics Concern   Not on file  Social History Narrative   Married    Social Determinants of Health   Financial Resource Strain: Low Risk  (10/22/2022)   Overall Financial Resource Strain (CARDIA)    Difficulty of Paying Living Expenses: Not hard at all  Food Insecurity: No Food Insecurity (10/22/2022)   Hunger Vital Sign    Worried About Running Out of Food in the Last Year: Never true    Ran Out of Food in the Last Year: Never true  Transportation Needs: No Transportation Needs (10/22/2022)   PRAPARE - Administrator, Civil Service (Medical): No    Lack of Transportation (Non-Medical): No  Physical Activity: Insufficiently Active (10/22/2022)   Exercise Vital Sign    Days of Exercise per Week: 2 days    Minutes of Exercise per Session: 40 min  Stress: No Stress Concern Present (10/22/2022)   [employer] of Occupational Health - Occupational Stress Questionnaire    Feeling of Stress : Not at all  Social Connections: Moderately Integrated (10/22/2022)   Social Connection and Isolation Panel [NHANES]    Frequency of Communication with Friends and Family: More than three times a week    Frequency of Social Gatherings with Friends and Family: More than three times a week    Attends Religious Services: More than 4 times per year    Active Member of [employer] or Organizations: Yes    Attends Banker Meetings: More than 4 times per year    Marital Status: Widowed  Intimate Partner Violence: Not At Risk (10/22/2022)   Humiliation, Afraid, Rape, and Kick questionnaire    Fear of Current or Ex-Partner: No    Emotionally Abused: No    Physically Abused: No    Sexually Abused: No     Past Surgical History:  Procedure Laterality Date   ABDOMINAL HYSTERECTOMY     ABDOMINAL HYSTERECTOMY     APPENDECTOMY     BREAST LUMPECTOMY     CATARACT EXTRACTION, BILATERAL     CHOLECYSTECTOMY  CORONARY STENT INTERVENTION N/A 07/26/2020   Procedure: CORONARY STENT INTERVENTION;  Surgeon: Marcina Millard, MD;  Location: [facility];  Service: Cardiovascular;  Laterality: N/A;   LEFT HEART CATH AND CORONARY ANGIOGRAPHY Left 07/26/2020   Procedure: LEFT HEART CATH AND CORONARY ANGIOGRAPHY;  Surgeon: Lamar Blinks, MD;  Location: [facility];  Service: Cardiovascular;  Laterality: Left;   REPLACEMENT TOTAL KNEE     TONSILLECTOMY AND ADENOIDECTOMY      Family History  Problem Relation Age of Onset   CAD Mother    Cancer Maternal Aunt        unk type   Leukemia Maternal Grandmother    Breast cancer Half-Sister        dx 44s    Allergies  Allergen Reactions   Ticagrelor Shortness Of Breath    Extreme        Latest Ref Rng & Units 10/04/2022    2:41 PM 02/15/2022    5:37 AM 02/14/2022    3:48 AM  CBC  WBC 4.0 - 10.5 K/uL 7.6  5.8  5.7   Hemoglobin 12.0 - 15.0 g/dL 16.1  09.6  04.5   Hematocrit 36.0 - 46.0 % 41.7  41.9  40.1   Platelets 150 - 400 K/uL 365  163  170       CMP     Component Value Date/Time   NA 143 01/17/2023 0900   K 3.6 01/17/2023 0900   CL 96 01/17/2023 0900   CO2 38 (H) 01/17/2023 0900   GLUCOSE 88 01/17/2023 0900   BUN 17 01/17/2023 0900   CREATININE 0.91 01/17/2023 0900   CREATININE 0.81 01/02/2019 1646   CALCIUM 9.5 01/17/2023 0900   PROT 6.8 03/11/2023 1019   ALBUMIN 3.7 03/11/2023 1019   AST 34 03/11/2023 1019   ALT 17 03/11/2023 1019   ALKPHOS 57 03/11/2023 1019   BILITOT 0.9 03/11/2023 1019   GFR 55.26 (L) 01/17/2023 0900   GFRNONAA 49 (L) 10/04/2022 1441     No results found.     Assessment & Plan:   1. Leg swelling Following his Unna boots the patient's swelling is much improved.  She  has also been diligently engaging in conservative therapy including use of medical grade compression, elevation and activity.  Based on the current level of control, the patient is satisfied.  Noninvasive studies do not indicate further intervention is necessary.  Will have patient follow-up as needed.  We discussed using lymphedema pump however given that conservative therapy is working well for her we will not proceed unless there are some issues with control of edema.  2. Hypercholesterolemia Continue statin as ordered and reviewed, no changes at this time  3. Primary hypertension Continue antihypertensive medications as already ordered, these medications have been reviewed and there are no changes at this time.   Current Outpatient Medications on File Prior to Visit  Medication Sig Dispense Refill   amLODipine (NORVASC) 2.5 MG tablet Take 2.5 mg by mouth 2 (two) times daily.     aspirin EC 81 MG EC tablet Take 1 tablet (81 mg total) by mouth daily. Swallow whole. 30 tablet 0   atorvastatin (LIPITOR) 40 MG tablet TAKE 1 TABLET(40 MG) BY MOUTH DAILY 90 tablet 1   calcium citrate-vitamin D (CITRACAL+D) 315-200 MG-UNIT tablet Take 1 tablet by mouth 2 (two) times daily.     carvedilol (COREG) 6.25 MG tablet Take 6.25 mg by mouth 2 (two) times daily with a meal.  clopidogrel (PLAVIX) 75 MG tablet Take 1 tablet (75 mg total) by mouth daily. 90 tablet 1   Fluticasone-Umeclidin-Vilant (TRELEGY ELLIPTA) 100-62.5-25 MCG/ACT AEPB Inhale 1 puff into the lungs daily. 60 each 5   furosemide (LASIX) 20 MG tablet Take by mouth.     ibandronate (BONIVA) 150 MG tablet Take 1 tablet (150 mg total) by mouth every 30 (thirty) days. Take in the morning with a full glass of water, on an empty stomach, and do not take anything else by mouth or lie down for the next 30 min. 12 tablet 0   irbesartan (AVAPRO) 150 MG tablet Take 1 tablet (150 mg total) by mouth daily. (Patient taking differently: Take 150 mg by mouth  in the morning and at bedtime.) 30 tablet 11   levothyroxine (SYNTHROID) 150 MCG tablet Take 1 tablet (150 mcg total) by mouth daily. 90 tablet 0   Multiple Vitamins-Minerals (PRESERVISION AREDS 2 PO) Take 1 tablet by mouth 2 (two) times daily.      Vibegron (GEMTESA) 75 MG TABS Take 75 mg by mouth daily. 90 tablet 2   No current facility-administered medications on file prior to visit.    There are no Patient Instructions on file for this visit. No follow-ups on file.   Georgiana Spinner, NP

## 2023-05-23 ENCOUNTER — Encounter (INDEPENDENT_AMBULATORY_CARE_PROVIDER_SITE_OTHER): Payer: Self-pay

## 2023-05-28 ENCOUNTER — Ambulatory Visit: Payer: Medicare Other | Admitting: Family

## 2023-06-03 ENCOUNTER — Ambulatory Visit: Payer: Medicare Other | Admitting: Family Medicine

## 2023-06-03 ENCOUNTER — Telehealth: Payer: Self-pay

## 2023-06-03 ENCOUNTER — Emergency Department
Admission: EM | Admit: 2023-06-03 | Discharge: 2023-06-03 | Disposition: A | Discharge status: Home / Self Care | Payer: Medicare Other | Attending: Emergency Medicine | Admitting: Emergency Medicine

## 2023-06-03 ENCOUNTER — Other Ambulatory Visit: Payer: Self-pay

## 2023-06-03 ENCOUNTER — Telehealth: Payer: Self-pay | Admitting: Internal Medicine

## 2023-06-03 ENCOUNTER — Encounter: Payer: Self-pay | Admitting: Emergency Medicine

## 2023-06-03 DIAGNOSIS — K6289 Other specified diseases of anus and rectum: Secondary | ICD-10-CM | POA: Diagnosis present

## 2023-06-03 DIAGNOSIS — I2510 Atherosclerotic heart disease of native coronary artery without angina pectoris: Secondary | ICD-10-CM | POA: Insufficient documentation

## 2023-06-03 DIAGNOSIS — I10 Essential (primary) hypertension: Secondary | ICD-10-CM | POA: Insufficient documentation

## 2023-06-03 MED ORDER — ZINC OXIDE 20 % EX OINT: 1.0000 | TOPICAL_OINTMENT | CUTANEOUS | 0 refills | Status: DC | PRN

## 2023-06-03 NOTE — Telephone Encounter (Signed)
Pt was advised to go to urgent care or emergency room due to not being able to be seen today because of late arrival. Pt's sister stated she was told 62. Informed both parties our records had 0820 for the appt time and it would best for the pt to be seen at the urgent care or emergency room. Both parties refused to go to the emergency room due to not wanting to have to wait and stated they would go to urgent care instead. I gave the pt's sister the direction to urgent and advise if pt's symptoms become worse to go to the emergency room immediately.

## 2023-06-03 NOTE — ED Triage Notes (Signed)
Pt to ED via POV for rectal prolapse- intermittent x5 days. States she has put sugar on it and but it is not staying in.

## 2023-06-03 NOTE — Discharge Instructions (Signed)
You were seen in the emergency department today for your rectal discomfort.  Fortunately we did not see signs of prolapse on your exam.  Please continue to take fiber and stay hydrated to try and prevent straining.  If you are having constipation, you can try an over-the-counter medicine such as MiraLAX or Dulcolax to help soften your stool.  I have sent in a ointment to help prevent skin breakdown.  Continue to follow with your primary care doctor for further evaluation.  I have also included information for follow-up with surgery.  Return to the ER for new or worsening symptoms.

## 2023-06-03 NOTE — Telephone Encounter (Signed)
Patien's son just called

## 2023-06-03 NOTE — ED Provider Notes (Signed)
[facility] Provider Note    Event Date/Time   First MD Initiated Contact with Patient 06/03/23 1107     (approximate)   History   Rectal Prolapse   HPI  Alexa Pham is a 89 y.o. female with history of HTN, CAD presenting to the emergency department for evaluation of rectal discomfort.  Patient reports that for the last month she has had some intermittent rectal discomfort secondary to rectal prolapse.  She has been applying sugar and initially was able to push the prolapse back in, but more recently feels like it is staying out.  She is seen her primary care doctor for this and was placed on Benefiber which was helping, but has had decreased fluid intake recently and a couple days ago with straining at which time she had worsening symptoms.  Otherwise denies significant abdominal pain.      Physical Exam   Triage Vital Signs: ED Triage Vitals  Encounter Vitals Group     BP 06/03/23 0929 137/61     Systolic BP Percentile --      Diastolic BP Percentile --      Pulse Rate 06/03/23 0929 71     Resp 06/03/23 0929 18     Temp 06/03/23 0929 97.8 F (36.6 C)     Temp Source 06/03/23 0929 Oral     SpO2 06/03/23 0929 95 %     Weight 06/03/23 0928 110 lb 3.7 oz (50 kg)     Height 06/03/23 0928 5\' 6"  (1.676 m)     Head Circumference --      Peak Flow --      Pain Score 06/03/23 0928 2     Pain Loc --      Pain Education --      Exclude from Growth Chart --     Most recent vital signs: Vitals:   06/03/23 0929  BP: 137/61  Pulse: 71  Resp: 18  Temp: 97.8 F (36.6 C)  SpO2: 95%     General: Awake, interactive  CV:  Regular rate, good peripheral perfusion.  Resp:  Lungs clear, unlabored respirations.  Abd:  Soft, nondistended, nontender to palpation, there are some areas of skin breakdown around the rectum, but no visualized rectal prolapse on my exam.  No visible anal fissure.  Rectal exam with loose rectal tone, no blood Neuro:  Symmetric  facial movement, fluid speech   ED Results / Procedures / Treatments   Labs (all labs ordered are listed, but only abnormal results are displayed) Labs Reviewed - No data to display   EKG EKG independently reviewed interpreted by myself (ER attending) demonstrates:    RADIOLOGY Imaging independently reviewed and interpreted by myself demonstrates:    PROCEDURES:  Critical Care performed: No  Procedures   MEDICATIONS ORDERED IN ED: Medications - No data to display   IMPRESSION / MDM / ASSESSMENT AND PLAN / ED COURSE  I reviewed the triage vital signs and the nursing notes.  Differential diagnosis includes, but is not limited to, intermittent rectal prolapse secondary to straining, skin irritation, hemorrhoids, anal fissure  Patient's presentation is most consistent with acute illness / injury with system symptoms.  89 year old female presenting to the emergency department for evaluation of rectal discomfort.  Initial concern had been for persistent rectal prolapse, but on my exam, no prolapse is present.  Suspect intermittent prolapse in the setting of straining.  Discussed routine bowel regimen.  Does have some skin breakdown, so I  will also send a prescription for for a topical ointment.  Will provide information for follow-up with surgery.  Strict return precautions provided.  Patient discharged stable condition.      FINAL CLINICAL IMPRESSION(S) / ED DIAGNOSES   Final diagnoses:  Rectal discomfort     Rx / DC Orders   ED Discharge Orders          Ordered    zinc oxide (MEIJER ZINC OXIDE) 20 % ointment  As needed        06/03/23 1220             Note:  This document was prepared using Dragon voice recognition software and may include unintentional dictation errors.   Trinna Post, MD 06/03/23 [phone]

## 2023-06-03 NOTE — ED Notes (Signed)
See triage notes. Patient stated her rectum is prolapsed and is out now. Stated it is uncomfortable.

## 2023-06-04 NOTE — Telephone Encounter (Signed)
Reviewed. Pt apparently scheduled to see Dr Clent Ridges.  Arrived late.  Was evaluated in ER. Note reviewed.

## 2023-06-05 ENCOUNTER — Ambulatory Visit (INDEPENDENT_AMBULATORY_CARE_PROVIDER_SITE_OTHER): Payer: Medicare Other | Admitting: Internal Medicine

## 2023-06-05 ENCOUNTER — Encounter: Payer: Self-pay | Admitting: Internal Medicine

## 2023-06-05 VITALS — BP 126/72 | HR 74 | Temp 97.7°F | Resp 16 | Ht 66.0 in | Wt 124.8 lb

## 2023-06-05 DIAGNOSIS — E7800 Pure hypercholesterolemia, unspecified: Secondary | ICD-10-CM

## 2023-06-05 DIAGNOSIS — I2510 Atherosclerotic heart disease of native coronary artery without angina pectoris: Secondary | ICD-10-CM

## 2023-06-05 DIAGNOSIS — R600 Localized edema: Secondary | ICD-10-CM

## 2023-06-05 DIAGNOSIS — N2889 Other specified disorders of kidney and ureter: Secondary | ICD-10-CM

## 2023-06-05 DIAGNOSIS — J479 Bronchiectasis, uncomplicated: Secondary | ICD-10-CM

## 2023-06-05 DIAGNOSIS — I272 Pulmonary hypertension, unspecified: Secondary | ICD-10-CM

## 2023-06-05 DIAGNOSIS — I700 Atherosclerosis of aorta: Secondary | ICD-10-CM | POA: Diagnosis not present

## 2023-06-05 DIAGNOSIS — K623 Rectal prolapse: Secondary | ICD-10-CM

## 2023-06-05 DIAGNOSIS — N816 Rectocele: Secondary | ICD-10-CM

## 2023-06-05 DIAGNOSIS — N3941 Urge incontinence: Secondary | ICD-10-CM

## 2023-06-05 DIAGNOSIS — I10 Essential (primary) hypertension: Secondary | ICD-10-CM

## 2023-06-05 DIAGNOSIS — R0602 Shortness of breath: Secondary | ICD-10-CM | POA: Diagnosis not present

## 2023-06-05 DIAGNOSIS — E039 Hypothyroidism, unspecified: Secondary | ICD-10-CM

## 2023-06-05 NOTE — Progress Notes (Unsigned)
Subjective:    [name], female    DOB: Jun 29, 1930, 89 y.o.   MRN: [id number]  Patient here for  Chief Complaint  Patient presents with   Follow-up    ED follow up for rectal prolapse    HPI Here for ED follow up. Evaluated 06/03/23 - rectal discomfort.  Concern regarding rectal prolapse. No prolapse on their exam.  Zinc oxide given for skin breakdown. She comes in today accompanied by her son.  History obtained from both of them.  Has had concerns regarding rectal prolapse, but over the last two weeks, symptoms have worsened.  Increased pain with sitting.  Bowels had been moving ok, but recently had issues with constipation.  She took a laxative and for the last three days has had soft stool - oozing from her bottom.  Has been taking benefiber and eating increased fiber.  She also has had issues with urinating since this started.  Hard for her to urinate when sitting on the toilet.  She has found that it is easier for her to urinate - when lying down.  Has been going on a pad or in a depends.  She has been seeing urology - f/u kidney lesion.  Last seen 10/2022.  Recommended f/u in one year.  She also recently has started having more problems with lower extremity swelling.  Weight back up to 124 pounds.  Taking lasix daily.  Using trelegy.  Reports no significant increased sob.  Did report some increase with increased walking.  No increased cough.  Since using neb bid and trelegy - breathing has been relatively stable overall. Followed by cardiology.  On their last visit, had decreased amlodipine to 2.5mg  bid and increased avapro to 150mg  bid.     Past Medical History:  Diagnosis Date   Actinic keratosis 01/28/2020   R thumb proximal nail fold    Atypical mole 05/13/2018   R distal lat thigh   Atypical mole 09/14/2014   left ant mid thigh   Atypical mole 01/19/2014   left paraspinal mid to upper back   Atypical mole 01/19/2014   right back mid to inf scapula   Atypical mole  01/19/2014   left posterior deltoid medial   Basal cell carcinoma 01/28/2020   left of midline mid dorsum nose   Basal cell carcinoma 01/06/2018   left preauricular    Basal cell carcinoma 11/22/2016   left upper lip   Basal cell carcinoma 03/17/2015   left lateral elbow   Basal cell carcinoma 07/22/2014   R medial inferior knee   Basal cell carcinoma 07/21/2020   Left dorsum hand. Nodular pattern. Excised 08/09/2020, margins free.   Basal cell carcinoma 02/23/2021   right dorsal hand (BCC/SCC), left dorsal hand, right chin - treated with EDC   Family history of breast cancer    History of breast cancer    History of pneumonia    Hypertension    Hypothyroidism    Macular degeneration    Squamous cell carcinoma of skin 01/06/2018   right dorsum hand at ring finger MCP   Squamous cell carcinoma of skin 07/08/2017   left distal dorsum forearm    Past Surgical History:  Procedure Laterality Date   ABDOMINAL HYSTERECTOMY     ABDOMINAL HYSTERECTOMY     APPENDECTOMY     BREAST LUMPECTOMY     CATARACT EXTRACTION, BILATERAL     CHOLECYSTECTOMY     CORONARY STENT INTERVENTION N/A 07/26/2020   Procedure:  CORONARY STENT INTERVENTION;  Surgeon: Marcina Millard, MD;  Location: [facility] INVASIVE CV LAB;  Service: Cardiovascular;  Laterality: N/A;   LEFT HEART CATH AND CORONARY ANGIOGRAPHY Left 07/26/2020   Procedure: LEFT HEART CATH AND CORONARY ANGIOGRAPHY;  Surgeon: Lamar Blinks, MD;  Location: [facility] INVASIVE CV LAB;  Service: Cardiovascular;  Laterality: Left;   REPLACEMENT TOTAL KNEE     TONSILLECTOMY AND ADENOIDECTOMY     Family History  Problem Relation Age of Onset   CAD Mother    Cancer Maternal Aunt        unk type   Leukemia Maternal Grandmother    Breast cancer Half-Sister        dx 15s   Social History   Socioeconomic History   Marital status: Widowed    Spouse name: Not on file   Number of children: Not on file   Years of education: Not on file   Highest  education level: Not on file  Occupational History   Not on file  Tobacco Use   Smoking status: Former    Current packs/day: 0.00    Average packs/day: 1.5 packs/day for 6.0 years (9.0 ttl pk-yrs)    Types: Cigarettes    Start date: 58    Quit date: 47    Years since quitting: 49.6   Smokeless tobacco: Never  Substance and Sexual Activity   Alcohol use: No    Alcohol/week: 0.0 standard drinks of alcohol   Drug use: No   Sexual activity: Not on file  Other Topics Concern   Not on file  Social History Narrative   Married    Social Determinants of Health   Financial Resource Strain: Low Risk  (10/22/2022)   Overall Financial Resource Strain (CARDIA)    Difficulty of Paying Living Expenses: Not hard at all  Food Insecurity: No Food Insecurity (10/22/2022)   Hunger Vital Sign    Worried About Running Out of Food in the Last Year: Never true    Ran Out of Food in the Last Year: Never true  Transportation Needs: No Transportation Needs (10/22/2022)   PRAPARE - Administrator, Civil Service (Medical): No    Lack of Transportation (Non-Medical): No  Physical Activity: Insufficiently Active (10/22/2022)   Exercise Vital Sign    Days of Exercise per Week: 2 days    Minutes of Exercise per Session: 40 min  Stress: No Stress Concern Present (10/22/2022)   [employer] of Occupational Health - Occupational Stress Questionnaire    Feeling of Stress : Not at all  Social Connections: Moderately Integrated (10/22/2022)   Social Connection and Isolation Panel [NHANES]    Frequency of Communication with Friends and Family: More than three times a week    Frequency of Social Gatherings with Friends and Family: More than three times a week    Attends Religious Services: More than 4 times per year    Active Member of [employer] or Organizations: Yes    Attends Banker Meetings: More than 4 times per year    Marital Status: Widowed     Review of Systems   Constitutional:  Negative for appetite change.       Weight increase as outlined.   HENT:  Negative for congestion.   Respiratory:  Negative for cough and chest tightness.        Some increased sob - but overall breathing has been relatively stable.   Cardiovascular:  Positive for leg swelling. Negative for chest pain  and palpitations.  Gastrointestinal:  Negative for nausea and vomiting.       Bowel issues as outlined.  Reports good appetite.   Genitourinary:  Positive for difficulty urinating. Negative for dysuria.  Musculoskeletal:  Negative for joint swelling and myalgias.  Skin:  Negative for color change and rash.  Neurological:  Negative for dizziness and headaches.  Psychiatric/Behavioral:  Negative for agitation and dysphoric mood.        Objective:     BP 126/72   Pulse 74   Temp 97.7 F (36.5 C)   Resp 16   Ht 5\' 6"  (1.676 m)   Wt 124 lb 12.8 oz (56.6 kg)   SpO2 92%   BMI 20.14 kg/m  Wt Readings from Last 3 Encounters:  06/05/23 124 lb 12.8 oz (56.6 kg)  06/03/23 110 lb 3.7 oz (50 kg)  04/17/23 111 lb 3.2 oz (50.4 kg)    Physical Exam Vitals reviewed.  Constitutional:      General: She is not in acute distress.    Appearance: Normal appearance.  HENT:     Head: Normocephalic and atraumatic.     Right Ear: External ear normal.     Left Ear: External ear normal.  Eyes:     General: No scleral icterus.       Right eye: No discharge.        Left eye: No discharge.     Conjunctiva/sclera: Conjunctivae normal.  Neck:     Thyroid: No thyromegaly.  Cardiovascular:     Rate and Rhythm: Normal rate and regular rhythm.  Pulmonary:     Effort: No respiratory distress.     Breath sounds: Normal breath sounds. No wheezing.  Abdominal:     General: Bowel sounds are normal.     Palpations: Abdomen is soft.     Tenderness: There is no abdominal tenderness.  Genitourinary:    Comments: Vaginal exam - no vaginal lesion.  Could not appreciate any bladder  prolapse.  Rectal exam - rectal prolapse.   Musculoskeletal:        General: No tenderness.     Cervical back: Neck supple. No tenderness.     Comments: Increased pedal and lower extremity edema - pitting edema - up to just below the knee.   Lymphadenopathy:     Cervical: No cervical adenopathy.  Skin:    Findings: No erythema or rash.  Neurological:     Mental Status: She is alert.  Psychiatric:        Mood and Affect: Mood normal.        Behavior: Behavior normal.      Outpatient Encounter Medications as of 06/05/2023  Medication Sig   albuterol (PROVENTIL) (2.5 MG/3ML) 0.083% nebulizer solution Take 2.5 mg by nebulization 2 (two) times daily.   amLODipine (NORVASC) 2.5 MG tablet Take 2.5 mg by mouth 2 (two) times daily.   aspirin EC 81 MG EC tablet Take 1 tablet (81 mg total) by mouth daily. Swallow whole.   atorvastatin (LIPITOR) 40 MG tablet TAKE 1 TABLET(40 MG) BY MOUTH DAILY   calcium citrate-vitamin D (CITRACAL+D) 315-200 MG-UNIT tablet Take 1 tablet by mouth 2 (two) times daily.   carvedilol (COREG) 6.25 MG tablet Take 6.25 mg by mouth 2 (two) times daily with a meal.   clopidogrel (PLAVIX) 75 MG tablet Take 1 tablet (75 mg total) by mouth daily.   Fluticasone-Umeclidin-Vilant (TRELEGY ELLIPTA) 100-62.5-25 MCG/ACT AEPB Inhale 1 puff into the lungs daily.   furosemide (LASIX)  20 MG tablet Take by mouth.   ibandronate (BONIVA) 150 MG tablet Take 1 tablet (150 mg total) by mouth every 30 (thirty) days. Take in the morning with a full glass of water, on an empty stomach, and do not take anything else by mouth or lie down for the next 30 min.   irbesartan (AVAPRO) 150 MG tablet Take 1 tablet (150 mg total) by mouth daily. (Patient taking differently: Take 150 mg by mouth in the morning and at bedtime.)   levothyroxine (SYNTHROID) 150 MCG tablet Take 1 tablet (150 mcg total) by mouth daily.   Multiple Vitamins-Minerals (PRESERVISION AREDS 2 PO) Take 1 tablet by mouth 2 (two) times  daily.    Vibegron (GEMTESA) 75 MG TABS Take 75 mg by mouth daily.   zinc oxide (ME[employer] ZINC OXIDE) 20 % ointment Apply 1 Application topically as needed for irritation.   No facility-administered encounter medications on file as of 06/05/2023.     Lab Results  Component Value Date   WBC 7.6 10/04/2022   HGB 14.0 10/04/2022   HCT 41.7 10/04/2022   PLT 365 10/04/2022   GLUCOSE 88 01/17/2023   CHOL 135 01/17/2023   TRIG 59.0 01/17/2023   HDL 59.00 01/17/2023   LDLCALC 64 01/17/2023   ALT 17 03/11/2023   AST 34 03/11/2023   NA 143 01/17/2023   K 3.6 01/17/2023   CL 96 01/17/2023   CREATININE 0.91 01/17/2023   BUN 17 01/17/2023   CO2 38 (H) 01/17/2023   TSH 2.56 03/11/2023   INR 1.1 02/13/2022   HGBA1C 5.6 02/13/2022    No results found.     Assessment & Plan:  Shortness of breath Assessment & Plan: Overall she has felt breathing has been stable.  Having increased problems now with urination and the rectocele - bowel issues.  Feels some increased anxiety related to this.  She feels may be aggravating her breathing some.  Increased lower extremity swelling as outlined.  Will increase lasix to bid for three days. Call with update.  Continue trelegy and nebs bid.   Orders: -     EKG 12-Lead  Bilateral lower extremity edema Assessment & Plan: Increased pedal and lower extremity swelling as outlined.  Breathing overall relatively stable.  Weight is up.  On lasix.  Will increase to 20mg  bid for the next three days.  Continue leg elevation.  Amlodipine dose recently decreased.  Get her back in with [facility] for reevaluation and question of need for UNNA wraps, etc.    Orders: -     Basic metabolic panel  Aortic atherosclerosis (HCC) Assessment & Plan: Continue lipitor.    Bronchiectasis without complication [facility]) Assessment & Plan: Recent evaluation by pulmonary.  Had pulmonary function tests 12/2022 - findings are consistent mostly with mild obstructive airways disease with  reversible component as seen in asthma. Was started on trelegy.  Also started on overnight oxygen - 2LNC. Overall she feels breathing is stable. Continues nebs bid.     Pulmonary hypertension (HCC) Assessment & Plan: Follow up with cardiology and pulmonary.  Overall she has felt breathing stable.    Rectocele Assessment & Plan: Rectal prolapse noted on exam.  Unable to reduce today.  Will place urgent consult for colorectal surgery.  [facility].  Continue benefiber - keep bowels moving.    Kidney mass Assessment & Plan: 10/2022 - Dr Apolinar Junes - The left renal mass is in the region somewhat, overall growth rate is fairly minimal dating  back to 22 months ago when first identified, on average 3 mm annually.  Recommended f/u scan one year.    Hypothyroidism, unspecified type Assessment & Plan: On synthroid.  Follow tsh.    Primary hypertension Assessment & Plan: Currently on avapro, coreg and amlodipine. Recently doses adjusted.  Amlodipine decreased to 2.5mg  bid and avapro increased to 150mg  bid. Taking lasix daily.  Increase to bid for 3 days. Follow.    Hypercholesterolemia Assessment & Plan: On lipitor. Follow lipid panel and liver function tests.    Coronary artery disease involving native coronary artery of native heart without angina pectoris Assessment & Plan: Previous admission for NSTEMI.  ECHO with no wall motion abnormality.  EF 60%.  Recommended dual antiplatelet therapy and statin medication.  No chest pain. Increased swelling as outlined.  EKG - SR with no acute ischemic changes.  Adjust lasix as outlined. Continue f/u with cardiology.    Urge incontinence Assessment & Plan: Has been evaluated by urology.  Trial of gemtesa.  With increased issues recently with the rectocele, she has noticed more problems with urination as well.  Is hard for her to urinate sitting on the toilet.  Has tried different positions.  Has found that lying down has been the  position that has worked best for her - wearing depends and using pads. Check met b to confirm kidney function stable.  Will have urology reevaluate.     Rectal prolapse Assessment & Plan: Rectal prolapse noted on exam.  Unable to reduce today.  Will place urgent consult for colorectal surgery.  Prefers evaluation at [facility].  Continue benefiber - keep bowels moving.   Orders: -     Ambulatory referral to Colorectal Surgery   I spent 45 minutes with the patient.  Time spent discussing her current concerns and symptoms. Specifically time spent discussing her rectocele and urinary issues.  Time also spent discussing further w/up, evaluation and treatment.    [place], MD

## 2023-06-06 ENCOUNTER — Ambulatory Visit (INDEPENDENT_AMBULATORY_CARE_PROVIDER_SITE_OTHER): Payer: Medicare Other | Admitting: Physician Assistant

## 2023-06-06 ENCOUNTER — Encounter: Payer: Self-pay | Admitting: Physician Assistant

## 2023-06-06 ENCOUNTER — Encounter: Payer: Self-pay | Admitting: Internal Medicine

## 2023-06-06 VITALS — BP 111/63 | HR 73

## 2023-06-06 DIAGNOSIS — K623 Rectal prolapse: Secondary | ICD-10-CM | POA: Diagnosis not present

## 2023-06-06 LAB — BASIC METABOLIC PANEL
BUN: 20 mg/dL (ref 6–23)
CO2: 36 meq/L — ABNORMAL HIGH (ref 19–32)
Calcium: 9.3 mg/dL (ref 8.4–10.5)
Chloride: 94 meq/L — ABNORMAL LOW (ref 96–112)
Creatinine, Ser: 0.94 mg/dL (ref 0.40–1.20)
GFR: 53.01 mL/min — ABNORMAL LOW (ref >60.00)
Glucose, Bld: 87 mg/dL (ref 70–99)
Potassium: 4 meq/L (ref 3.5–5.1)
Sodium: 139 meq/L (ref 135–145)

## 2023-06-06 NOTE — Assessment & Plan Note (Signed)
Previous admission for NSTEMI.  ECHO with no wall motion abnormality.  EF 60%.  Recommended dual antiplatelet therapy and statin medication.  No chest pain. Increased swelling as outlined.  EKG - SR with no acute ischemic changes.  Adjust lasix as outlined. Continue f/u with cardiology.

## 2023-06-06 NOTE — Assessment & Plan Note (Signed)
10/2022 - Dr Brandon - The left renal mass is in the region somewhat, overall growth rate is fairly minimal dating back to 22 months ago when first identified, on average 3 mm annually.  Recommended f/u scan one year.  

## 2023-06-06 NOTE — Progress Notes (Signed)
06/06/2023 2:55 PM   Ruby Cola 1930/06/12 [id number]  CC: Chief Complaint  Patient presents with   Follow-up   HPI: Alexa Pham is a 89 y.o. female with PMH left renal mass on active surveillance and urge incontinence with intermittent success on Gemtesa who presents today for "no bladder control." She is accompanied today by her son, Alexa Pham, who contributes to HPI.   Today she reports bothersome rectal prolapse.  She has frequent fecal leakage and overall is very uncomfortable due to her prolapse.  She saw Dr. Lorin Picket for this yesterday, who referred her to [place] for further evaluation.  She states that she has a hard time urinating due to the prolapse, and will have to reposition herself supine in order to void.  She is not having any dysuria or sensations of incomplete bladder emptying.  PMH: Past Medical History:  Diagnosis Date   Actinic keratosis 01/28/2020   R thumb proximal nail fold    Atypical mole 05/13/2018   R distal lat thigh   Atypical mole 09/14/2014   left ant mid thigh   Atypical mole 01/19/2014   left paraspinal mid to upper back   Atypical mole 01/19/2014   right back mid to inf scapula   Atypical mole 01/19/2014   left posterior deltoid medial   Basal cell carcinoma 01/28/2020   left of midline mid dorsum nose   Basal cell carcinoma 01/06/2018   left preauricular    Basal cell carcinoma 11/22/2016   left upper lip   Basal cell carcinoma 03/17/2015   left lateral elbow   Basal cell carcinoma 07/22/2014   R medial inferior knee   Basal cell carcinoma 07/21/2020   Left dorsum hand. Nodular pattern. Excised 08/09/2020, margins free.   Basal cell carcinoma 02/23/2021   right dorsal hand (BCC/SCC), left dorsal hand, right chin - treated with EDC   Family history of breast cancer    History of breast cancer    History of pneumonia    Hypertension    Hypothyroidism    Macular degeneration    Squamous cell carcinoma of skin 01/06/2018   right dorsum hand  at ring finger MCP   Squamous cell carcinoma of skin 07/08/2017   left distal dorsum forearm     Surgical History: Past Surgical History:  Procedure Laterality Date   ABDOMINAL HYSTERECTOMY     ABDOMINAL HYSTERECTOMY     APPENDECTOMY     BREAST LUMPECTOMY     CATARACT EXTRACTION, BILATERAL     CHOLECYSTECTOMY     CORONARY STENT INTERVENTION N/A 07/26/2020   Procedure: CORONARY STENT INTERVENTION;  Surgeon: Marcina Millard, MD;  Location: [facility] INVASIVE CV LAB;  Service: Cardiovascular;  Laterality: N/A;   LEFT HEART CATH AND CORONARY ANGIOGRAPHY Left 07/26/2020   Procedure: LEFT HEART CATH AND CORONARY ANGIOGRAPHY;  Surgeon: Lamar Blinks, MD;  Location: [facility] INVASIVE CV LAB;  Service: Cardiovascular;  Laterality: Left;   REPLACEMENT TOTAL KNEE     TONSILLECTOMY AND ADENOIDECTOMY      Home Medications:  Allergies as of 06/06/2023       Reactions   Ticagrelor Shortness Of Breath   Extreme         Medication List        Accurate as of June 06, 2023  2:55 PM. If you have any questions, ask your nurse or doctor.          albuterol (2.5 MG/3ML) 0.083% nebulizer solution Commonly known as: PROVENTIL Take 2.5 mg by  nebulization 2 (two) times daily.   amLODipine 2.5 MG tablet Commonly known as: NORVASC Take 2.5 mg by mouth 2 (two) times daily.   aspirin EC 81 MG tablet Take 1 tablet (81 mg total) by mouth daily. Swallow whole.   atorvastatin 40 MG tablet Commonly known as: LIPITOR TAKE 1 TABLET(40 MG) BY MOUTH DAILY   calcium citrate-vitamin D 315-200 MG-UNIT tablet Commonly known as: CITRACAL+D Take 1 tablet by mouth 2 (two) times daily.   carvedilol 6.25 MG tablet Commonly known as: COREG Take 6.25 mg by mouth 2 (two) times daily with a meal.   clopidogrel 75 MG tablet Commonly known as: PLAVIX Take 1 tablet (75 mg total) by mouth daily.   furosemide 20 MG tablet Commonly known as: LASIX Take by mouth.   Gemtesa 75 MG Tabs Generic drug:  Vibegron Take 75 mg by mouth daily.   ibandronate 150 MG tablet Commonly known as: Boniva Take 1 tablet (150 mg total) by mouth every 30 (thirty) days. Take in the morning with a full glass of water, on an empty stomach, and do not take anything else by mouth or lie down for the next 30 min.   irbesartan 150 MG tablet Commonly known as: AVAPRO Take 1 tablet (150 mg total) by mouth daily. What changed: when to take this   levothyroxine 150 MCG tablet Commonly known as: SYNTHROID Take 1 tablet (150 mcg total) by mouth daily.   PRESERVISION AREDS 2 PO Take 1 tablet by mouth 2 (two) times daily.   Trelegy Ellipta 100-62.5-25 MCG/ACT Aepb Generic drug: Fluticasone-Umeclidin-Vilant Inhale 1 puff into the lungs daily.   zinc oxide 20 % ointment Commonly known as: [employer] Zinc Oxide Apply 1 Application topically as needed for irritation.        Allergies:  Allergies  Allergen Reactions   Ticagrelor Shortness Of Breath    Extreme     Family History: Family History  Problem Relation Age of Onset   CAD Mother    Cancer Maternal Aunt        unk type   Leukemia Maternal Grandmother    Breast cancer Half-Sister        dx 22s    Social History:   reports that she quit smoking about 49 years ago. Her smoking use included cigarettes. She started smoking about 55 years ago. She has a 9 pack-year smoking history. She has never used smokeless tobacco. She reports that she does not drink alcohol and does not use drugs.  Physical Exam: BP 111/63   Pulse 73   Constitutional:  Alert and oriented, no acute distress, nontoxic appearing HEENT: [place], AT Cardiovascular: No clubbing, cyanosis, or edema Respiratory: Normal respiratory effort, no increased work of breathing GI: Rectal prolapse is noted while standing.  This is self reducing while laying supine.  Fecal incontinence noted. GU: Narrow introitus.  Pale labia.  Sparse pubic hair.  1+ posterior pelvic organ prolapse. Skin: No  rashes, bruises or suspicious lesions Neurologic: Grossly intact, no focal deficits, moving all 4 extremities Psychiatric: Normal mood and affect  Assessment & Plan:   1. Rectal prolapse Her only urologic complaint is difficulty voiding secondary to her known rectal prolapse.  I did appreciate the rectal prolapse on physical exam today, and it is reducible at this time.  There is also evidence of rectocele on pelvic exam, however this is far less severe and I expect less bothersome for the patient.  I explained that she requires referral to colorectal surgery  for management of her rectal prolapse.  I think a urogyn referral is less appropriate, given comparatively less severe rectocele.  I am placing an urgent referral today to colorectal surgery at [facility], as she is adamant that she would want this repaired at [facility] with [facility] surgeons.  I provided her with the name and office phone number for this clinic.  She and her son were rather dissatisfied today and wanted to know when they could expect to hear from [facility].  I explained multiple times that I have no power over the timing of other offices, and that I am doing the best I can to get them timely care for a problem that is not urologic.  We also discussed that a reducible rectal prolapse is not a medical emergency, however if it becomes irreducible, she should proceed to the emergency department for immediate assistance. - Ambulatory referral to Colorectal Surgery   Return if symptoms worsen or fail to improve.  I spent 30 minutes on the day of the encounter to include pre-visit record review, face-to-face time with the patient, and post-visit ordering of tests.   Carman Ching, PA-C  [facility] [address], [address] [place], [place] [address] [phone]

## 2023-06-06 NOTE — Assessment & Plan Note (Signed)
Recent evaluation by pulmonary.  Had pulmonary function tests 12/2022 - findings are consistent mostly with mild obstructive airways disease with reversible component as seen in asthma. Was started on trelegy.  Also started on overnight oxygen - 2LNC. Overall she feels breathing is stable. Continues nebs bid.

## 2023-06-06 NOTE — Assessment & Plan Note (Signed)
Has been evaluated by urology.  Trial of gemtesa.  With increased issues recently with the rectocele, she has noticed more problems with urination as well.  Is hard for her to urinate sitting on the toilet.  Has tried different positions.  Has found that lying down has been the position that has worked best for her - wearing depends and using pads. Check met b to confirm kidney function stable.  Will have urology reevaluate.

## 2023-06-06 NOTE — Assessment & Plan Note (Addendum)
Rectal prolapse noted on exam.  Unable to reduce today.  Will place urgent consult for colorectal surgery.  Prefers evaluation at [facility].  Continue benefiber - keep bowels moving.

## 2023-06-06 NOTE — Assessment & Plan Note (Signed)
Follow up with cardiology and pulmonary.  Overall she has felt breathing stable.

## 2023-06-06 NOTE — Assessment & Plan Note (Signed)
Currently on avapro, coreg and amlodipine. Recently doses adjusted.  Amlodipine decreased to 2.5mg  bid and avapro increased to 150mg  bid. Taking lasix daily.  Increase to bid for 3 days. Follow.

## 2023-06-06 NOTE — Assessment & Plan Note (Signed)
On synthroid.  Follow tsh.   

## 2023-06-06 NOTE — Assessment & Plan Note (Signed)
Continue lipitor  ?

## 2023-06-06 NOTE — Assessment & Plan Note (Signed)
Overall she has felt breathing has been stable.  Having increased problems now with urination and the rectocele - bowel issues.  Feels some increased anxiety related to this.  She feels may be aggravating her breathing some.  Increased lower extremity swelling as outlined.  Will increase lasix to bid for three days. Call with update.  Continue trelegy and nebs bid.

## 2023-06-06 NOTE — Patient Instructions (Addendum)
I have placed an urgent referral to Dr. Donnetta Hutching with [facility].  They will call you to schedule your appointment. They can be reached at [phone] in the meantime.

## 2023-06-06 NOTE — Assessment & Plan Note (Signed)
On lipitor.  Follow lipid panel and liver function tests.   

## 2023-06-06 NOTE — Addendum Note (Signed)
Addended by: Charm Barges on: 06/06/2023 06:13 AM   Modules accepted: Orders

## 2023-06-06 NOTE — Assessment & Plan Note (Signed)
Increased pedal and lower extremity swelling as outlined.  Breathing overall relatively stable.  Weight is up.  On lasix.  Will increase to 20mg  bid for the next three days.  Continue leg elevation.  Amlodipine dose recently decreased.  Get her back in with [facility] for reevaluation and question of need for UNNA wraps, etc.

## 2023-06-07 ENCOUNTER — Telehealth: Payer: Self-pay | Admitting: Internal Medicine

## 2023-06-07 NOTE — Telephone Encounter (Addendum)
Alexa Pham , son of patient called, [phone]. Patient was seen this week by Dr Lorin Picket and was suppose to put in a referral to see a surgeon. Son states this needs to been done asap.  Also, Son states that patient's legs are swollen worse today. Alexa Pham states that Dr Lorin Picket said something about getting them wrapped.

## 2023-06-07 NOTE — Telephone Encounter (Signed)
Will discuss with pt in result note. See result note for documentation.

## 2023-06-07 NOTE — Telephone Encounter (Signed)
Called and LM for [facility] to return my call.

## 2023-06-07 NOTE — Telephone Encounter (Signed)
Spoke with son and provided number to [facility]. Spoke with [facility] and scheduled her for Wed 9/4. Patients son aware.

## 2023-06-11 ENCOUNTER — Encounter: Payer: Self-pay | Admitting: Internal Medicine

## 2023-06-11 DIAGNOSIS — K623 Rectal prolapse: Secondary | ICD-10-CM

## 2023-06-11 NOTE — Telephone Encounter (Signed)
If agreeable, can refer to [facility] in [place] - colorectal surgeon.

## 2023-06-11 NOTE — Telephone Encounter (Signed)
Let me know how to proceed. I know she initially preferred [facility] but is there another surgeon we can send referral to?

## 2023-06-12 ENCOUNTER — Encounter (INDEPENDENT_AMBULATORY_CARE_PROVIDER_SITE_OTHER): Payer: Self-pay

## 2023-06-12 ENCOUNTER — Ambulatory Visit (INDEPENDENT_AMBULATORY_CARE_PROVIDER_SITE_OTHER): Payer: Medicare Other | Admitting: Nurse Practitioner

## 2023-06-12 VITALS — BP 146/70 | HR 76 | Resp 16

## 2023-06-12 DIAGNOSIS — M7989 Other specified soft tissue disorders: Secondary | ICD-10-CM

## 2023-06-12 NOTE — Telephone Encounter (Signed)
Referral placed to [facility]. Spoke with son to let him know. He is ok with this.

## 2023-06-14 ENCOUNTER — Other Ambulatory Visit (INDEPENDENT_AMBULATORY_CARE_PROVIDER_SITE_OTHER): Payer: Self-pay | Admitting: Nurse Practitioner

## 2023-06-14 ENCOUNTER — Telehealth: Payer: Self-pay

## 2023-06-14 DIAGNOSIS — M7989 Other specified soft tissue disorders: Secondary | ICD-10-CM

## 2023-06-14 NOTE — Telephone Encounter (Signed)
Transition Care Management Unsuccessful Follow-up Telephone Call  Date of discharge and from where:  06/03/2023 [facility]  Attempts:  1st Attempt  Reason for unsuccessful TCM follow-up call:  No answer/busy  [name] [name]  [facility] Population Health Community Resource Care Guide   [name]  ?? [phone]   Website: [email or web address]  [email or web address]

## 2023-06-17 ENCOUNTER — Telehealth: Payer: Self-pay

## 2023-06-17 NOTE — Telephone Encounter (Signed)
Transition Care Management Follow-up Telephone Call Date of discharge and from where: 06/03/2023 [facility] How have you been since you were released from the hospital? Patient stated she is not doing very well and is scheduled for surgery soon. Any questions or concerns? No  Items Reviewed: Did the pt receive and understand the discharge instructions provided? Yes  Medications obtained and verified? Yes  Other? No  Any new allergies since your discharge? No  Dietary orders reviewed? Yes Do you have support at home? Yes   Follow up appointments reviewed:  PCP Hospital f/u appt confirmed? Yes  Scheduled to see Carman Ching, PA-C on 06/06/2023 @ [facility] [place]. Specialist Hospital f/u appt confirmed? Yes  Scheduled to see Ardeth Sportsman, MD on 07/08/2023 @ [facility]. Are transportation arrangements needed? No  If their condition worsens, is the pt aware to call PCP or go to the Emergency Dept.? Yes Was the patient provided with contact information for the PCP's office or ED? Yes Was to pt encouraged to call back with questions or concerns? Yes  Rashawd Laskaris [name]  [facility] Population Health Community Resource Care Guide   [email or web address]  ?? [phone]   Website: [email or web address]

## 2023-06-18 ENCOUNTER — Telehealth (INDEPENDENT_AMBULATORY_CARE_PROVIDER_SITE_OTHER): Payer: Self-pay

## 2023-06-18 NOTE — Telephone Encounter (Signed)
Called pt to let her know that [facility] will be out tomorrow to change her wraps she states verbal understanding

## 2023-07-03 ENCOUNTER — Encounter: Payer: Self-pay | Admitting: Internal Medicine

## 2023-07-03 ENCOUNTER — Ambulatory Visit (INDEPENDENT_AMBULATORY_CARE_PROVIDER_SITE_OTHER): Payer: Medicare Other | Admitting: Internal Medicine

## 2023-07-03 DIAGNOSIS — I700 Atherosclerosis of aorta: Secondary | ICD-10-CM | POA: Diagnosis not present

## 2023-07-03 DIAGNOSIS — J479 Bronchiectasis, uncomplicated: Secondary | ICD-10-CM

## 2023-07-03 DIAGNOSIS — I2510 Atherosclerotic heart disease of native coronary artery without angina pectoris: Secondary | ICD-10-CM

## 2023-07-03 DIAGNOSIS — E039 Hypothyroidism, unspecified: Secondary | ICD-10-CM

## 2023-07-03 DIAGNOSIS — R600 Localized edema: Secondary | ICD-10-CM

## 2023-07-03 DIAGNOSIS — E7800 Pure hypercholesterolemia, unspecified: Secondary | ICD-10-CM

## 2023-07-03 DIAGNOSIS — Z853 Personal history of malignant neoplasm of breast: Secondary | ICD-10-CM

## 2023-07-03 DIAGNOSIS — I272 Pulmonary hypertension, unspecified: Secondary | ICD-10-CM

## 2023-07-03 DIAGNOSIS — N2889 Other specified disorders of kidney and ureter: Secondary | ICD-10-CM

## 2023-07-03 DIAGNOSIS — I10 Essential (primary) hypertension: Secondary | ICD-10-CM

## 2023-07-03 DIAGNOSIS — K623 Rectal prolapse: Secondary | ICD-10-CM

## 2023-07-03 NOTE — Progress Notes (Signed)
Subjective:    Patient ID: Ruby Cola, female    DOB: November 27, 1929, 89 y.o.   MRN: [id number]  Patient here for scheduled physical. Wanted to hold on physical.    HPI Here for a scheduled follow up. Pt wanted to hold on physical exam. She is accompanied by her son.  History obtained from both of them. Having persistent issues with rectal prolapse.  Due to see surgery next week.  Increased rectal pain with sitting and when first goes to get up.  Bowels are moving with no straining.  She reports she has a bowel movement without knowing she is having a bowel movement.  Able to sense needs to urinate and able to control. No chest pain reported. Breathing stable.  Using neb 2x/day and inhaler.  Overall breathing improved.  Eating.  Had seen vascular for her lower extremity swelling.  Legs wrapped.  Home health to continue with wraps.  Apparently a mix up.  Will d/w vascular regarding continued wraps.  She reports she is watching her diet and trying to adhere to low sodium.     Past Medical History:  Diagnosis Date   Actinic keratosis 01/28/2020   R thumb proximal nail fold    Atypical mole 05/13/2018   R distal lat thigh   Atypical mole 09/14/2014   left ant mid thigh   Atypical mole 01/19/2014   left paraspinal mid to upper back   Atypical mole 01/19/2014   right back mid to inf scapula   Atypical mole 01/19/2014   left posterior deltoid medial   Basal cell carcinoma 01/28/2020   left of midline mid dorsum nose   Basal cell carcinoma 01/06/2018   left preauricular    Basal cell carcinoma 11/22/2016   left upper lip   Basal cell carcinoma 03/17/2015   left lateral elbow   Basal cell carcinoma 07/22/2014   R medial inferior knee   Basal cell carcinoma 07/21/2020   Left dorsum hand. Nodular pattern. Excised 08/09/2020, margins free.   Basal cell carcinoma 02/23/2021   right dorsal hand (BCC/SCC), left dorsal hand, right chin - treated with EDC   Family history of breast cancer     History of breast cancer    History of pneumonia    Hypertension    Hypothyroidism    Macular degeneration    Squamous cell carcinoma of skin 01/06/2018   right dorsum hand at ring finger MCP   Squamous cell carcinoma of skin 07/08/2017   left distal dorsum forearm    Past Surgical History:  Procedure Laterality Date   ABDOMINAL HYSTERECTOMY     ABDOMINAL HYSTERECTOMY     APPENDECTOMY     BREAST LUMPECTOMY     CATARACT EXTRACTION, BILATERAL     CHOLECYSTECTOMY     CORONARY STENT INTERVENTION N/A 07/26/2020   Procedure: CORONARY STENT INTERVENTION;  Surgeon: Marcina Millard, MD;  Location: [facility] INVASIVE CV LAB;  Service: Cardiovascular;  Laterality: N/A;   LEFT HEART CATH AND CORONARY ANGIOGRAPHY Left 07/26/2020   Procedure: LEFT HEART CATH AND CORONARY ANGIOGRAPHY;  Surgeon: Lamar Blinks, MD;  Location: [facility] INVASIVE CV LAB;  Service: Cardiovascular;  Laterality: Left;   REPLACEMENT TOTAL KNEE     TONSILLECTOMY AND ADENOIDECTOMY     Family History  Problem Relation Age of Onset   CAD Mother    Cancer Maternal Aunt        unk type   Leukemia Maternal Grandmother    Breast cancer Half-Sister  dx 32s   Social History   Socioeconomic History   Marital status: Widowed    Spouse name: Not on file   Number of children: Not on file   Years of education: Not on file   Highest education level: Not on file  Occupational History   Not on file  Tobacco Use   Smoking status: Former    Current packs/day: 0.00    Average packs/day: 1.5 packs/day for 6.0 years (9.0 ttl pk-yrs)    Types: Cigarettes    Start date: 54    Quit date: 66    Years since quitting: 49.7   Smokeless tobacco: Never  Substance and Sexual Activity   Alcohol use: No    Alcohol/week: 0.0 standard drinks of alcohol   Drug use: No   Sexual activity: Not on file  Other Topics Concern   Not on file  Social History Narrative   Married    Social Determinants of Health   Financial  Resource Strain: Low Risk  (10/22/2022)   Overall Financial Resource Strain (CARDIA)    Difficulty of Paying Living Expenses: Not hard at all  Food Insecurity: No Food Insecurity (10/22/2022)   Hunger Vital Sign    Worried About Running Out of Food in the Last Year: Never true    Ran Out of Food in the Last Year: Never true  Transportation Needs: No Transportation Needs (10/22/2022)   PRAPARE - Administrator, Civil Service (Medical): No    Lack of Transportation (Non-Medical): No  Physical Activity: Insufficiently Active (10/22/2022)   Exercise Vital Sign    Days of Exercise per Week: 2 days    Minutes of Exercise per Session: 40 min  Stress: No Stress Concern Present (10/22/2022)   [employer] of Occupational Health - Occupational Stress Questionnaire    Feeling of Stress : Not at all  Social Connections: Moderately Integrated (10/22/2022)   Social Connection and Isolation Panel [NHANES]    Frequency of Communication with Friends and Family: More than three times a week    Frequency of Social Gatherings with Friends and Family: More than three times a week    Attends Religious Services: More than 4 times per year    Active Member of [employer] or Organizations: Yes    Attends Banker Meetings: More than 4 times per year    Marital Status: Widowed     Review of Systems  Constitutional:  Negative for appetite change.       Weight has improved from previous check.   HENT:  Negative for congestion and sinus pressure.   Respiratory:  Negative for cough and chest tightness.        Breathing overall stable.   Cardiovascular:  Positive for leg swelling. Negative for chest pain and palpitations.  Gastrointestinal:  Negative for abdominal pain, nausea and vomiting.       Bowels issues and rectal pain as outlined - due to rectal prolapse.   Genitourinary:  Negative for difficulty urinating and dysuria.  Musculoskeletal:  Negative for joint swelling and myalgias.   Skin:  Negative for color change and rash.  Neurological:  Negative for dizziness and headaches.  Psychiatric/Behavioral:  Negative for agitation and dysphoric mood.        Objective:     BP 120/72   Pulse 80   Temp 98.2 F (36.8 C)   Resp 16   Ht 5\' 6"  (1.676 m)   Wt 117 lb 12.8 oz (53.4 kg)  SpO2 96%   BMI 19.01 kg/m  Wt Readings from Last 3 Encounters:  07/03/23 117 lb 12.8 oz (53.4 kg)  06/05/23 124 lb 12.8 oz (56.6 kg)  06/03/23 110 lb 3.7 oz (50 kg)    Physical Exam Vitals reviewed.  Constitutional:      General: She is not in acute distress.    Appearance: Normal appearance.  HENT:     Head: Normocephalic and atraumatic.     Right Ear: External ear normal.     Left Ear: External ear normal.  Eyes:     General: No scleral icterus.       Right eye: No discharge.        Left eye: No discharge.     Conjunctiva/sclera: Conjunctivae normal.  Neck:     Thyroid: No thyromegaly.  Cardiovascular:     Rate and Rhythm: Normal rate and regular rhythm.  Pulmonary:     Effort: No respiratory distress.     Breath sounds: Normal breath sounds. No wheezing.  Abdominal:     General: Bowel sounds are normal.     Palpations: Abdomen is soft.     Tenderness: There is no abdominal tenderness.  Genitourinary:    Comments: Declined f/u rectal exam.  Musculoskeletal:     Cervical back: Neck supple. No tenderness.     Comments: Pedal and lower extremity edema - extends to below knee.   Lymphadenopathy:     Cervical: No cervical adenopathy.  Skin:    Findings: No erythema or rash.  Neurological:     Mental Status: She is alert.  Psychiatric:        Mood and Affect: Mood normal.        Behavior: Behavior normal.      Outpatient Encounter Medications as of 07/03/2023  Medication Sig   albuterol (PROVENTIL) (2.5 MG/3ML) 0.083% nebulizer solution Take 2.5 mg by nebulization 2 (two) times daily.   amLODipine (NORVASC) 2.5 MG tablet Take 2.5 mg by mouth 2 (two) times  daily.   aspirin EC 81 MG EC tablet Take 1 tablet (81 mg total) by mouth daily. Swallow whole.   atorvastatin (LIPITOR) 40 MG tablet TAKE 1 TABLET(40 MG) BY MOUTH DAILY   calcium citrate-vitamin D (CITRACAL+D) 315-200 MG-UNIT tablet Take 1 tablet by mouth 2 (two) times daily.   carvedilol (COREG) 6.25 MG tablet Take 6.25 mg by mouth 2 (two) times daily with a meal.   clopidogrel (PLAVIX) 75 MG tablet Take 1 tablet (75 mg total) by mouth daily.   Fluticasone-Umeclidin-Vilant (TRELEGY ELLIPTA) 100-62.5-25 MCG/ACT AEPB Inhale 1 puff into the lungs daily.   furosemide (LASIX) 20 MG tablet Take by mouth.   ibandronate (BONIVA) 150 MG tablet Take 1 tablet (150 mg total) by mouth every 30 (thirty) days. Take in the morning with a full glass of water, on an empty stomach, and do not take anything else by mouth or lie down for the next 30 min.   irbesartan (AVAPRO) 150 MG tablet Take 1 tablet (150 mg total) by mouth daily. (Patient taking differently: Take 150 mg by mouth in the morning and at bedtime.)   levothyroxine (SYNTHROID) 150 MCG tablet Take 1 tablet (150 mcg total) by mouth daily.   Multiple Vitamins-Minerals (PRESERVISION AREDS 2 PO) Take 1 tablet by mouth 2 (two) times daily.    Vibegron (GEMTESA) 75 MG TABS Take 75 mg by mouth daily.   zinc oxide (MEIJER ZINC OXIDE) 20 % ointment Apply 1 Application topically as needed for  irritation.   No facility-administered encounter medications on file as of 07/03/2023.     Lab Results  Component Value Date   WBC 7.6 10/04/2022   HGB 14.0 10/04/2022   HCT 41.7 10/04/2022   PLT 365 10/04/2022   GLUCOSE 87 06/05/2023   CHOL 135 01/17/2023   TRIG 59.0 01/17/2023   HDL 59.00 01/17/2023   LDLCALC 64 01/17/2023   ALT 17 03/11/2023   AST 34 03/11/2023   NA 139 06/05/2023   K 4.0 06/05/2023   CL 94 (L) 06/05/2023   CREATININE 0.94 06/05/2023   BUN 20 06/05/2023   CO2 36 (H) 06/05/2023   TSH 2.56 03/11/2023   INR 1.1 02/13/2022   HGBA1C 5.6  02/13/2022       Assessment & Plan:  Aortic atherosclerosis (HCC) Assessment & Plan: Continue lipitor.    Bronchiectasis without complication [facility]) Assessment & Plan: Recent evaluation by pulmonary.  Had pulmonary function tests 12/2022 - findings are consistent mostly with mild obstructive airways disease with reversible component as seen in asthma. Was started on trelegy.  Also started on overnight oxygen - 2LNC. Overall she feels breathing is stable. Continues nebs bid.     Pulmonary hypertension (HCC) Assessment & Plan: Follow up with cardiology and pulmonary.  Overall she has felt breathing stable.    Bilateral lower extremity edema Assessment & Plan: Increased pedal and lower extremity swelling as outlined.  Breathing overall stable.  Weight has improved from last check.  She is weighing herself.  Does not take lasix if going out. Will have her restart lasix.  Call with update beginning of next week.  Continue leg elevation.  Amlodipine dose recently decreased.  Saw [facility].  Legs wrapped.  Had discussed home health.  See if can arrange f/u - wraps.  Leg elevation when sitting.  Follow.    Coronary artery disease involving native coronary artery of native heart without angina pectoris Assessment & Plan: Previous admission for NSTEMI.  ECHO with no wall motion abnormality.  EF 60%.  Recommended dual antiplatelet therapy and statin medication.  No chest pain. Increased swelling as outlined.  Adjust lasix as outlined. Continue f/u with cardiology.    History of breast cancer Assessment & Plan: S/p lumpectomy.  Declines mammogram.    Hypercholesterolemia Assessment & Plan: On lipitor. Follow lipid panel and liver function tests.    Primary hypertension Assessment & Plan: Currently on avapro, coreg and amlodipine. Recently doses adjusted.  Amlodipine decreased to 2.5mg  bid and avapro increased to 150mg  bid. Restart lasix as outlined.  Follow.     Hypothyroidism, unspecified  type Assessment & Plan: On synthroid.  Follow tsh.    Kidney mass Assessment & Plan: 10/2022 - Dr Apolinar Junes - The left renal mass is in the region somewhat, overall growth rate is fairly minimal dating back to 22 months ago when first identified, on average 3 mm annually.  Recommended f/u scan one year.    Rectal prolapse Assessment & Plan: Rectal prolapse noted on exam.  Unable to reduce previously.  Wanted to hold on reevaluation on exam today. Bowels are moving as outlined.  Unaware of bowels moving.  Has appt with surgery next week.        Dale [place], MD

## 2023-07-08 ENCOUNTER — Encounter: Payer: Self-pay | Admitting: Internal Medicine

## 2023-07-08 ENCOUNTER — Telehealth: Payer: Self-pay | Admitting: Internal Medicine

## 2023-07-08 NOTE — Assessment & Plan Note (Signed)
Increased pedal and lower extremity swelling as outlined.  Breathing overall stable.  Weight has improved from last check.  She is weighing herself.  Does not take lasix if going out. Will have her restart lasix.  Call with update beginning of next week.  Continue leg elevation.  Amlodipine dose recently decreased.  Saw [facility].  Legs wrapped.  Had discussed home health.  See if can arrange f/u - wraps.  Leg elevation when sitting.  Follow.

## 2023-07-08 NOTE — Assessment & Plan Note (Signed)
Rectal prolapse noted on exam.  Unable to reduce previously.  Wanted to hold on reevaluation on exam today. Bowels are moving as outlined.  Unaware of bowels moving.  Has appt with surgery next week.

## 2023-07-08 NOTE — Assessment & Plan Note (Signed)
On synthroid.  Follow tsh.   

## 2023-07-08 NOTE — Assessment & Plan Note (Signed)
Recent evaluation by pulmonary.  Had pulmonary function tests 12/2022 - findings are consistent mostly with mild obstructive airways disease with reversible component as seen in asthma. Was started on trelegy.  Also started on overnight oxygen - 2LNC. Overall she feels breathing is stable. Continues nebs bid.

## 2023-07-08 NOTE — Assessment & Plan Note (Signed)
S/p lumpectomy.  Declines mammogram.  

## 2023-07-08 NOTE — Assessment & Plan Note (Signed)
On lipitor.  Follow lipid panel and liver function tests.   

## 2023-07-08 NOTE — Telephone Encounter (Signed)
Patient has appt 10/2

## 2023-07-08 NOTE — Assessment & Plan Note (Signed)
Follow up with cardiology and pulmonary.  Overall she has felt breathing stable.

## 2023-07-08 NOTE — Assessment & Plan Note (Signed)
10/2022 - Dr Brandon - The left renal mass is in the region somewhat, overall growth rate is fairly minimal dating back to 22 months ago when first identified, on average 3 mm annually.  Recommended f/u scan one year.  

## 2023-07-08 NOTE — Assessment & Plan Note (Signed)
Previous admission for NSTEMI.  ECHO with no wall motion abnormality.  EF 60%.  Recommended dual antiplatelet therapy and statin medication.  No chest pain. Increased swelling as outlined.  Adjust lasix as outlined. Continue f/u with cardiology.

## 2023-07-08 NOTE — Telephone Encounter (Signed)
Has seen [facility] (last saw [facility]) for lower extremity swelling.  Needs a f/u appt scheduled.  Persistent lower extremity swelling.  Should not need new referral.  Please schedule an appt.  Thanks.

## 2023-07-08 NOTE — Assessment & Plan Note (Signed)
Continue lipitor  ?

## 2023-07-08 NOTE — Assessment & Plan Note (Signed)
Currently on avapro, coreg and amlodipine. Recently doses adjusted.  Amlodipine decreased to 2.5mg  bid and avapro increased to 150mg  bid. Restart lasix as outlined.  Follow.

## 2023-07-09 ENCOUNTER — Ambulatory Visit: Payer: Self-pay | Admitting: Surgery

## 2023-07-09 DIAGNOSIS — R739 Hyperglycemia, unspecified: Secondary | ICD-10-CM

## 2023-07-10 ENCOUNTER — Encounter (INDEPENDENT_AMBULATORY_CARE_PROVIDER_SITE_OTHER): Payer: Self-pay | Admitting: Nurse Practitioner

## 2023-07-10 ENCOUNTER — Ambulatory Visit (INDEPENDENT_AMBULATORY_CARE_PROVIDER_SITE_OTHER): Payer: Medicare Other | Admitting: Nurse Practitioner

## 2023-07-10 VITALS — BP 118/59 | HR 71 | Resp 16 | Wt 115.6 lb

## 2023-07-10 DIAGNOSIS — I890 Lymphedema, not elsewhere classified: Secondary | ICD-10-CM | POA: Diagnosis not present

## 2023-07-10 DIAGNOSIS — M7989 Other specified soft tissue disorders: Secondary | ICD-10-CM

## 2023-07-10 DIAGNOSIS — I10 Essential (primary) hypertension: Secondary | ICD-10-CM | POA: Diagnosis not present

## 2023-07-10 DIAGNOSIS — E7800 Pure hypercholesterolemia, unspecified: Secondary | ICD-10-CM

## 2023-07-10 NOTE — Progress Notes (Signed)
Subjective:    [name], female    DOB: 29-Mar-1930, 89 y.o.   MRN: [id number] Chief Complaint  Patient presents with   Follow-up    4 week follow up    The patient is a 89 year old female who presents today as a referral from Dr. Lorin Picket in regards to bilateral lower extremity edema.  The patient swelling has been doing much better up until recently.  She unfortunately suffered a rectal prolapse which was very daunting for her.  It was recently treated which has caused some improvement in her life but during the several weeks while she had this prolapse she was unable to maintain the conservative therapy tactics for her swelling.  Since then her swelling has decreased but it is notably around her mid shin down her foot.  She also continues with her diuretics as prescribed by cardiology.   Review of Systems  Cardiovascular:  Positive for leg swelling.  All other systems reviewed and are negative.      Objective:   Physical Exam Vitals reviewed.  HENT:     Head: Normocephalic.  Cardiovascular:     Rate and Rhythm: Normal rate.  Pulmonary:     Effort: Pulmonary effort is normal.  Musculoskeletal:     Right lower leg: Pitting Edema present.     Left lower leg: 2+ Pitting Edema present.  Skin:    General: Skin is warm and dry.  Neurological:     Mental Status: She is alert and oriented to person, place, and time.  Psychiatric:        Mood and Affect: Mood normal.        Behavior: Behavior normal.        Thought Content: Thought content normal.        Judgment: Judgment normal.    BP (!) 118/59 (BP Location: Left Arm)   Pulse 71   Resp 16   Wt 115 lb 9.6 oz (52.4 kg)   BMI 18.66 kg/m   Past Medical History:  Diagnosis Date   Actinic keratosis 01/28/2020   R thumb proximal nail fold    Atypical mole 05/13/2018   R distal lat thigh   Atypical mole 09/14/2014   left ant mid thigh   Atypical mole 01/19/2014   left paraspinal mid to upper back   Atypical  mole 01/19/2014   right back mid to inf scapula   Atypical mole 01/19/2014   left posterior deltoid medial   Basal cell carcinoma 01/28/2020   left of midline mid dorsum nose   Basal cell carcinoma 01/06/2018   left preauricular    Basal cell carcinoma 11/22/2016   left upper lip   Basal cell carcinoma 03/17/2015   left lateral elbow   Basal cell carcinoma 07/22/2014   R medial inferior knee   Basal cell carcinoma 07/21/2020   Left dorsum hand. Nodular pattern. Excised 08/09/2020, margins free.   Basal cell carcinoma 02/23/2021   right dorsal hand (BCC/SCC), left dorsal hand, right chin - treated with EDC   Family history of breast cancer    History of breast cancer    History of pneumonia    Hypertension    Hypothyroidism    Macular degeneration    Squamous cell carcinoma of skin 01/06/2018   right dorsum hand at ring finger MCP   Squamous cell carcinoma of skin 07/08/2017   left distal dorsum forearm     Social History   Socioeconomic History   Marital status:  Widowed    Spouse name: Not on file   Number of children: Not on file   Years of education: Not on file   Highest education level: Not on file  Occupational History   Not on file  Tobacco Use   Smoking status: Former    Current packs/day: 0.00    Average packs/day: 1.5 packs/day for 6.0 years (9.0 ttl pk-yrs)    Types: Cigarettes    Start date: 14    Quit date: 51    Years since quitting: 49.7   Smokeless tobacco: Never  Substance and Sexual Activity   Alcohol use: No    Alcohol/week: 0.0 standard drinks of alcohol   Drug use: No   Sexual activity: Not on file  Other Topics Concern   Not on file  Social History Narrative   Married    Social Determinants of Health   Financial Resource Strain: Low Risk  (10/22/2022)   Overall Financial Resource Strain (CARDIA)    Difficulty of Paying Living Expenses: Not hard at all  Food Insecurity: No Food Insecurity (10/22/2022)   Hunger Vital Sign     Worried About Running Out of Food in the Last Year: Never true    Ran Out of Food in the Last Year: Never true  Transportation Needs: No Transportation Needs (10/22/2022)   PRAPARE - Administrator, Civil Service (Medical): No    Lack of Transportation (Non-Medical): No  Physical Activity: Insufficiently Active (10/22/2022)   Exercise Vital Sign    Days of Exercise per Week: 2 days    Minutes of Exercise per Session: 40 min  Stress: No Stress Concern Present (10/22/2022)   [employer] of Occupational Health - Occupational Stress Questionnaire    Feeling of Stress : Not at all  Social Connections: Moderately Integrated (10/22/2022)   Social Connection and Isolation Panel [NHANES]    Frequency of Communication with Friends and Family: More than three times a week    Frequency of Social Gatherings with Friends and Family: More than three times a week    Attends Religious Services: More than 4 times per year    Active Member of [employer] or Organizations: Yes    Attends Banker Meetings: More than 4 times per year    Marital Status: Widowed  Intimate Partner Violence: Not At Risk (10/22/2022)   Humiliation, Afraid, Rape, and Kick questionnaire    Fear of Current or Ex-Partner: No    Emotionally Abused: No    Physically Abused: No    Sexually Abused: No    Past Surgical History:  Procedure Laterality Date   ABDOMINAL HYSTERECTOMY     ABDOMINAL HYSTERECTOMY     APPENDECTOMY     BREAST LUMPECTOMY     CATARACT EXTRACTION, BILATERAL     CHOLECYSTECTOMY     CORONARY STENT INTERVENTION N/A 07/26/2020   Procedure: CORONARY STENT INTERVENTION;  Surgeon: Marcina Millard, MD;  Location: [facility] INVASIVE CV LAB;  Service: Cardiovascular;  Laterality: N/A;   LEFT HEART CATH AND CORONARY ANGIOGRAPHY Left 07/26/2020   Procedure: LEFT HEART CATH AND CORONARY ANGIOGRAPHY;  Surgeon: Lamar Blinks, MD;  Location: [facility] INVASIVE CV LAB;  Service: Cardiovascular;  Laterality:  Left;   REPLACEMENT TOTAL KNEE     TONSILLECTOMY AND ADENOIDECTOMY      Family History  Problem Relation Age of Onset   CAD Mother    Cancer Maternal Aunt        unk type   Leukemia Maternal  Grandmother    Breast cancer Half-Sister        dx 33s    Allergies  Allergen Reactions   Ticagrelor Shortness Of Breath    Extreme        Latest Ref Rng & Units 10/04/2022    2:41 PM 02/15/2022    5:37 AM 02/14/2022    3:48 AM  CBC  WBC 4.0 - 10.5 K/uL 7.6  5.8  5.7   Hemoglobin 12.0 - 15.0 g/dL 57.8  46.9  62.9   Hematocrit 36.0 - 46.0 % 41.7  41.9  40.1   Platelets 150 - 400 K/uL 365  163  170       CMP     Component Value Date/Time   NA 139 06/05/2023 1353   K 4.0 06/05/2023 1353   CL 94 (L) 06/05/2023 1353   CO2 36 (H) 06/05/2023 1353   GLUCOSE 87 06/05/2023 1353   BUN 20 06/05/2023 1353   CREATININE 0.94 06/05/2023 1353   CREATININE 0.81 01/02/2019 1646   CALCIUM 9.3 06/05/2023 1353   PROT 6.8 03/11/2023 1019   ALBUMIN 3.7 03/11/2023 1019   AST 34 03/11/2023 1019   ALT 17 03/11/2023 1019   ALKPHOS 57 03/11/2023 1019   BILITOT 0.9 03/11/2023 1019   GFRNONAA 49 (L) 10/04/2022 1441   GFRAA >60 05/05/2020 0433     No results found.     Assessment & Plan:     1. Lymphedema Recommend:  No surgery or intervention at this point in time.   The Patient is CEAP [id number].  The patient has been wearing compression for more than 12 weeks with no or little benefit.  The patient has been exercising daily for more than 12 weeks. The patient has been elevating and taking OTC pain medications for more than 12 weeks.  None of these have have eliminated the pain related to the lymphedema or the discomfort regarding excessive swelling and venous congestion.    I have reviewed my discussion with the patient regarding lymphedema and why it  causes symptoms.  Patient will continue wearing graduated compression on a daily basis. The patient should put the compression on first  thing in the morning and removing them in the evening. The patient should not sleep in the compression.   In addition, behavioral modification throughout the day will be continued.  This will include frequent elevation (such as in a recliner), use of over the counter pain medications as needed and exercise such as walking.  The systemic causes for chronic edema such as liver, kidney and cardiac etiologies do not appear to have significant changed over the past year.    The patient has chronic , severe lymphedema with hyperpigmentation of the skin and has done MLD, skin care, medication, diet, exercise, elevation and compression for 4 weeks with no improvement,  I am recommending a lymphedema pump.  The patient still has stage 3 lymphedema and therefore, I believe that a lymph pump is needed to improve the control of the patient's lymphedema and improve the quality of life.  Additionally, a lymph pump is warranted because it will reduce the risk of cellulitis and ulceration in the future.  Patient should follow-up in six months   2. Leg swelling Will place the patient in Unna boots today in order to gain control the swelling so that she can return using her compression socks.  Will plan on wrapping over the next 4 weeks.  She is advised to continue to elevate  her lower extremities when not engaging in activity.    3. Hypercholesterolemia Continue statin as ordered and reviewed, no changes at this time  4. Primary hypertension Continue antihypertensive medications as already ordered, these medications have been reviewed and there are no changes at this time.    Current Outpatient Medications on File Prior to Visit  Medication Sig Dispense Refill   albuterol (PROVENTIL) (2.5 MG/3ML) 0.083% nebulizer solution Take 2.5 mg by nebulization 2 (two) times daily.     amLODipine (NORVASC) 2.5 MG tablet Take 2.5 mg by mouth 2 (two) times daily.     aspirin EC 81 MG EC tablet Take 1 tablet (81 mg total)  by mouth daily. Swallow whole. 30 tablet 0   atorvastatin (LIPITOR) 40 MG tablet TAKE 1 TABLET(40 MG) BY MOUTH DAILY 90 tablet 1   calcium citrate-vitamin D (CITRACAL+D) 315-200 MG-UNIT tablet Take 1 tablet by mouth 2 (two) times daily.     carvedilol (COREG) 6.25 MG tablet Take 6.25 mg by mouth 2 (two) times daily with a meal.     clopidogrel (PLAVIX) 75 MG tablet Take 1 tablet (75 mg total) by mouth daily. 90 tablet 1   Fluticasone-Umeclidin-Vilant (TRELEGY ELLIPTA) 100-62.5-25 MCG/ACT AEPB Inhale 1 puff into the lungs daily. 60 each 5   furosemide (LASIX) 20 MG tablet Take by mouth.     ibandronate (BONIVA) 150 MG tablet Take 1 tablet (150 mg total) by mouth every 30 (thirty) days. Take in the morning with a full glass of water, on an empty stomach, and do not take anything else by mouth or lie down for the next 30 min. 12 tablet 0   irbesartan (AVAPRO) 150 MG tablet Take 1 tablet (150 mg total) by mouth daily. (Patient taking differently: Take 150 mg by mouth in the morning and at bedtime.) 30 tablet 11   levothyroxine (SYNTHROID) 150 MCG tablet Take 1 tablet (150 mcg total) by mouth daily. 90 tablet 0   Multiple Vitamins-Minerals (PRESERVISION AREDS 2 PO) Take 1 tablet by mouth 2 (two) times daily.      Vibegron (GEMTESA) 75 MG TABS Take 75 mg by mouth daily. 90 tablet 2   zinc oxide (MEIJER ZINC OXIDE) 20 % ointment Apply 1 Application topically as needed for irritation. 56.7 g 0   No current facility-administered medications on file prior to visit.    There are no Patient Instructions on file for this visit. No follow-ups on file.   Georgiana Spinner, NP

## 2023-07-17 ENCOUNTER — Encounter (INDEPENDENT_AMBULATORY_CARE_PROVIDER_SITE_OTHER): Payer: Self-pay

## 2023-07-17 ENCOUNTER — Encounter: Payer: Self-pay | Admitting: Pulmonary Disease

## 2023-07-17 ENCOUNTER — Ambulatory Visit: Payer: Medicare Other | Admitting: Pulmonary Disease

## 2023-07-17 ENCOUNTER — Ambulatory Visit (INDEPENDENT_AMBULATORY_CARE_PROVIDER_SITE_OTHER): Payer: Medicare Other | Admitting: Nurse Practitioner

## 2023-07-17 ENCOUNTER — Telehealth: Payer: Self-pay | Admitting: Internal Medicine

## 2023-07-17 VITALS — BP 148/71 | HR 76 | Resp 16

## 2023-07-17 VITALS — BP 110/62 | HR 71 | Temp 97.6°F | Ht 66.0 in | Wt 116.8 lb

## 2023-07-17 DIAGNOSIS — R0902 Hypoxemia: Secondary | ICD-10-CM | POA: Diagnosis not present

## 2023-07-17 DIAGNOSIS — J45909 Unspecified asthma, uncomplicated: Secondary | ICD-10-CM

## 2023-07-17 DIAGNOSIS — K623 Rectal prolapse: Secondary | ICD-10-CM | POA: Diagnosis not present

## 2023-07-17 DIAGNOSIS — Z01811 Encounter for preprocedural respiratory examination: Secondary | ICD-10-CM

## 2023-07-17 DIAGNOSIS — J479 Bronchiectasis, uncomplicated: Secondary | ICD-10-CM

## 2023-07-17 DIAGNOSIS — I890 Lymphedema, not elsewhere classified: Secondary | ICD-10-CM | POA: Diagnosis not present

## 2023-07-17 DIAGNOSIS — J4540 Moderate persistent asthma, uncomplicated: Secondary | ICD-10-CM

## 2023-07-17 NOTE — Telephone Encounter (Signed)
Patient dropped off document  attending physician statement , to be filled out by provider. Patient requested to send it back via Call Patient to pick up within 5-days. Document is located in providers tray at front office.Please advise at [facility] [phone] . Caregiver Manganelli will pick up.

## 2023-07-17 NOTE — Telephone Encounter (Signed)
Form placed out for review. It is for her to get refunded for a cruise that she had to cancel due to her rectal prolapse.

## 2023-07-17 NOTE — Progress Notes (Signed)
Subjective:    [name], female    DOB: July 11, 1930, 89 y.o.   MRN: [id number]  Patient Care Team: Dale [place], MD as PCP - General (Internal Medicine) Salena Saner, MD as Consulting Physician (Pulmonary Disease) Elige Radon, MD as Referring Physician (Cardiology)  Chief Complaint  Patient presents with   Follow-up    HPI 89 year old female, former smoker. PMH significant for pulmonary hypertension, nocturnal hypoxia, bronchiectasis.   Gensis is a 89 year old very remote/light smoker, who presents for follow-up of shortness of breath that has gotten worse over the last year.  She was last seen by Ames Dura, NP on 08 April 2023, this is a scheduled visit.  At her prior visit she was well compensated.  Since her prior visit she has had issues with rectal prolapse and now will need repair of this.  She is to have this done by [facility] in [place].    Recall that she was initially seen on January 2020 at time she had had an issue with pneumonia and films were consistent with bronchiectasis and tree-in-bud type peribronchial nodular opacities. This is a pattern seen with MAC.  Subsequently we saw her again in June 2020, her CT at that time had improved.  She remained asymptomatic from the MAC standpoint.  She was supposed to see [place] in January 2021 however canceled that appointment and then eventually was seen in November 2021.  At that time she was complaining of shortness of breath that had been worse since her cardiac catheterization in late October 2021 she had been placed on Brillinta.  It appeared that Brillinta was worsening symptoms of dyspnea this medication was switched and the patient actually did better after that.  Because her dyspnea resolved she never followed up with [place] after that.  She reconnected with [place] on 15 November 2022, after a hiatus of 3 years.  She complained of orthopnea with paroxysmal nocturnal dyspnea on several  occasions during the month prior.  She had noted increasing fatigue and dyspnea on exertion. No wheezing.  No sputum production no hemoptysis.  No fevers, chills or sweats.  She coughed only when she laid flat.  She has not had any gastroesophageal reflux symptoms.  At her visit on 8 February pulmonary function testing was ordered as well as overnight oximetry.  Patient had ambulatory oximetry performed during the visit that did not show significant O2 desaturations.  During that visit it was postulated that there was a possibility for pulmonary hypertension and a recommendation for consideration of right heart cath was done.  Her PFTs were finally performed on 14 March which were consistent with moderate obstructive defect with bronchodilator response.  She was started on Trelegy Ellipta.  She has difficulty understanding metered-dose inhalers but was able to understand the use of the Trelegy.  She also was noted to have significant nocturnal hypoxemia and is now on 2 L/min nocturnally.  She is compliant with the therapy and notes benefit from the same.   She has been followed at [facility] pulmonary clinic as well.  She has been on hypertonic saline twice a day via nebulizer however notes no help with this medication and at times finds it irritating.    On today's visit she notes that she has not had any cough or wheezing.  She states the Trelegy is working well.  She has not noted any dyspnea on exertion however notes that with her recent issues with rectal prolapse she  is basically sedentary.  Has not had any fevers, chills or sweats.  No recent lower extremity edema.  She does not endorse any other symptomatology.      DATA 10/15/2022 echocardiogram [facility]): Normal LVEF (calculation 54%), normal RV systolic function mild AR, trivial MR, moderate PR, mild TR.  Estimated RVSP 50 mmHg. 12/14/2022 overnight oximetry on room air: Patient exhibited desaturations as low as 68%.  Please note that the study states  that it was done on 06 September 2008, this is erroneous.  Testing was done in March 2024. 12/20/2022 PFTs: FEV1 0.88 L or 53% predicted, FVC 1.37 L or 61% of predicted, FEV1/FVC 64%, there is a 32% net change post bronchodilator with FEV1 improving to 1.17 L or 71% predicted after bronchodilator.  Lung volumes were mildly reduced.  Could not perform diffusion maneuvers.  Consistent with obstructive airways disease with reversibility, likely concomitant restriction.  Diffusion capacity could not be obtained due to patient's inability to perform the maneuvers. 03/05/2023 CT chest without contrast [facility]): Scattered bilateral nodular opacities with associated bronchiectasis most prominent in the right middle lobe and lingula.  Suggestive of chronic endobronchial infection such as MAC.  Enlarged main and bilateral pulmonary artery suggestive of elevated pulmonary arterial pressures.  Trace bilateral effusions.    Review of Systems A 10 point review of systems was performed and it is as noted above otherwise negative.   Patient Active Problem List   Diagnosis Date Noted   Nocturnal hypoxia 04/08/2023   Pulmonary hypertension (HCC) 04/07/2023   Bilateral lower extremity edema 02/20/2023   Rectal prolapse 02/04/2023   Urge incontinence 11/08/2022   Shortness of breath 10/04/2022   Cough 06/24/2022   Fatigue 06/03/2022   Nocturia 06/03/2022   Premature beats 05/31/2022   Osteopenia 02/18/2022   NSTEMI (non-ST elevated myocardial infarction) (HCC) 02/13/2022   Basal cell carcinoma 02/23/2021   Genetic testing 08/31/2020   Family history of breast cancer    Kidney mass 08/08/2020   CAD (coronary artery disease) 07/26/2020   Angina pectoris (HCC) 07/04/2020   Abnormal liver function test 06/15/2020   Dizziness 05/04/2020   Elevated troponin 05/04/2020   Hypomagnesemia 05/04/2020   Orthostatic hypotension 05/04/2020   Shingles 01/10/2020   Weight loss 05/10/2019   Stress 04/19/2019   Aortic  atherosclerosis (HCC) 11/20/2018   Abnormal CT lung screening 11/07/2018   Bronchiectasis without complication (HCC) 11/07/2018   Abnormal chest CT 10/19/2018   Health care maintenance 07/06/2018   History of breast cancer 12/01/2017   Macular degeneration 12/01/2017   Hypertension 11/28/2017   Hypothyroidism 11/28/2017   Hypercholesterolemia 11/28/2017    Social History   Tobacco Use   Smoking status: Former    Current packs/day: 0.00    Average packs/day: 1.5 packs/day for 6.0 years (9.0 ttl pk-yrs)    Types: Cigarettes    Start date: 46    Quit date: 1975    Years since quitting: 49.8   Smokeless tobacco: Never  Substance Use Topics   Alcohol use: No    Alcohol/week: 0.0 standard drinks of alcohol    Allergies  Allergen Reactions   Ticagrelor Shortness Of Breath    Extreme     Current Meds  Medication Sig   albuterol (PROVENTIL) (2.5 MG/3ML) 0.083% nebulizer solution Take 2.5 mg by nebulization 2 (two) times daily.   amLODipine (NORVASC) 2.5 MG tablet Take 2.5 mg by mouth 2 (two) times daily.   aspirin EC 81 MG EC tablet Take 1 tablet (81 mg total)  by mouth daily. Swallow whole.   atorvastatin (LIPITOR) 40 MG tablet TAKE 1 TABLET(40 MG) BY MOUTH DAILY   calcium citrate-vitamin D (CITRACAL+D) 315-200 MG-UNIT tablet Take 1 tablet by mouth 2 (two) times daily.   carvedilol (COREG) 6.25 MG tablet Take 6.25 mg by mouth 2 (two) times daily with a meal.   clopidogrel (PLAVIX) 75 MG tablet Take 1 tablet (75 mg total) by mouth daily.   Fluticasone-Umeclidin-Vilant (TRELEGY ELLIPTA) 100-62.5-25 MCG/ACT AEPB Inhale 1 puff into the lungs daily.   furosemide (LASIX) 20 MG tablet Take by mouth.   ibandronate (BONIVA) 150 MG tablet Take 1 tablet (150 mg total) by mouth every 30 (thirty) days. Take in the morning with a full glass of water, on an empty stomach, and do not take anything else by mouth or lie down for the next 30 min.   irbesartan (AVAPRO) 150 MG tablet Take 1 tablet  (150 mg total) by mouth daily. (Patient taking differently: Take 150 mg by mouth in the morning and at bedtime.)   levothyroxine (SYNTHROID) 150 MCG tablet Take 1 tablet (150 mcg total) by mouth daily.   Multiple Vitamins-Minerals (PRESERVISION AREDS 2 PO) Take 1 tablet by mouth 2 (two) times daily.    sodium chloride HYPERTONIC 3 % nebulizer solution Take 4 mLs by nebulization in the morning and at bedtime.   Vibegron (GEMTESA) 75 MG TABS Take 75 mg by mouth daily.   zinc oxide (MEIJER ZINC OXIDE) 20 % ointment Apply 1 Application topically as needed for irritation.    Immunization History  Administered Date(s) Administered   Fluad Quad(high Dose 65+) 06/14/2019, 06/10/2020, 09/21/2021   Influenza, High Dose Seasonal PF 07/04/2015, 07/02/2017, 07/03/2018   Influenza-Unspecified 06/22/2016, 07/08/2022   PFIZER Comirnaty(Gray Top)Covid-19 Tri-Sucrose Vaccine 11/06/2019, 11/27/2019, 07/11/2020   PFIZER(Purple Top)SARS-COV-2 Vaccination 11/06/2019, 11/27/2019   Pneumococcal Conjugate-13 04/21/2016   Pneumococcal Polysaccharide-23 05/05/2012   Tdap 11/09/2014        Objective:     BP 110/62 (BP Location: Left Arm, Cuff Size: Normal)   Pulse 71   Temp 97.6 F (36.4 C)   Ht 5\' 6"  (1.676 m)   Wt 116 lb 12.8 oz (53 kg)   SpO2 92%   BMI 18.85 kg/m   SpO2: 92 % O2 Device: None (Room air)  GENERAL: Slender, well-developed elderly woman in no acute respiratory distress.  Fully ambulatory. HEAD: Normocephalic, atraumatic.  EYES: Pupils equal, round, reactive to light.  No scleral icterus.  MOUTH: Dentition intact, some chipped teeth, oral mucosa moist.  No thrush. NECK: Supple. No thyromegaly. Trachea midline. No JVD.  No adenopathy. PULMONARY: Good air entry bilaterally.  Coarse, otherwise no adventitious sounds. CARDIOVASCULAR: S1 and S2. Regular rate and rhythm patient with occasional extrasystoles noted.  No rubs, murmurs or gallops heard. ABDOMEN: Benign. MUSCULOSKELETAL: No  joint deformity, no clubbing, LE's +1 edema.  NEUROLOGIC: No overt focal deficit.  No gait disturbance.  Speech is fluent. SKIN: Intact,warm,dry.  Chronic stasis changes of the lower extremities PSYCH: Mood and behavior normal.    Assessment & Plan:     ICD-10-CM   1. Moderate persistent asthma without complication  J45.40    Appears to be controlled on Trelegy Continue Trelegy Continue as needed albuterol    2. Bronchiectasis without complication (HCC)  J47.9    Continue use of Acapella flutter valve Does not expectorate after 3% saline 3% saline prescribed at [facility] pulmonary    3. Nocturnal hypoxemia due to asthma  R09.02    J45.909  Patient compliant with oxygen at 2 L/min Notes benefit of therapy    4. Rectal prolapse  K62.3    Will need repair This will be done in [place]    5. Pre-operative respiratory examination  Z01.811    Well compensated with regards to asthma/bronchiectasis Moderate risk for pulmonary complication for proposed procedure     Patient was instructed to continue Trelegy, nocturnal oxygen supplementation and use of Acapella flutter valve.  She notes that hypertonic saline treatments do not help with expectoration and actually can be irritating.  Advised she may take a short break and see if she notices any different on reinstitution of this therapy.  These were prescribed by pulmonary at [facility].  I advised the patient that she does not need to be following with 2 pulmonary subspecialists she would like to continue follow-up here as well as at [facility].   Will see the patient in follow-up in 6 months time she is to call sooner should any new problems arise.  Gailen Shelter, MD Advanced Bronchoscopy PCCM [facility]    *This note was dictated using voice recognition software/Dragon.  Despite best efforts to proofread, errors can occur which can change the meaning. Any transcriptional errors that result from this process are  unintentional and may not be fully corrected at the time of dictation.

## 2023-07-17 NOTE — Patient Instructions (Signed)
Continue using your Trelegy.  Continue your oxygen at nighttime.  Continue use of Acapella flutter valve.  We will see you in follow-up in 6 months time call sooner should any new problems arise.

## 2023-07-17 NOTE — Progress Notes (Signed)
History of Present Illness  There is no documented history at this time  Assessments & Plan   There are no diagnoses linked to this encounter.    Additional instructions  Subjective:  Patient presents with venous ulcer of the Bilateral lower extremity.    Procedure:  3 layer unna wrap was placed Bilateral lower extremity.   Plan:   Follow up in one week.  

## 2023-07-18 NOTE — Telephone Encounter (Signed)
Reviewed form. See form.  Need a little more information.  When was the cruise?  She has listed Dr Karie Soda - on the form - saw her.  Where is he located and where did she see him?

## 2023-07-22 ENCOUNTER — Telehealth: Payer: Self-pay | Admitting: Internal Medicine

## 2023-07-22 DIAGNOSIS — E7800 Pure hypercholesterolemia, unspecified: Secondary | ICD-10-CM

## 2023-07-22 NOTE — Telephone Encounter (Signed)
Patient came into the office. Leaving to go to [place] on 07/02/2023 and cruise was 07/04/2023 to 07/18/2023, patient was not 100% sure of the return date. She said it was for 2 weeks.

## 2023-07-22 NOTE — Telephone Encounter (Signed)
Called patients son to discuss form. LMTCB. Dr Michaell Cowing is the surgeon from [place]. Was seen 07/2023. Need to know when the cruise is supposed to be.

## 2023-07-22 NOTE — Telephone Encounter (Signed)
Cruise was supposed to be 9/26-10/10. Was supposed to leave for [place] 9/24

## 2023-07-22 NOTE — Telephone Encounter (Signed)
Patient need lab orders.

## 2023-07-22 NOTE — Telephone Encounter (Signed)
Ok to complete

## 2023-07-23 NOTE — Telephone Encounter (Signed)
Patient's son called asking to speak with [place]. Can you give him a call when you can.

## 2023-07-23 NOTE — Telephone Encounter (Signed)
Lab orders placed.  

## 2023-07-23 NOTE — Telephone Encounter (Signed)
Spoke with son. Waiting on him to call back to confirm dates of trip then can complete form

## 2023-07-24 ENCOUNTER — Encounter (INDEPENDENT_AMBULATORY_CARE_PROVIDER_SITE_OTHER): Payer: Self-pay

## 2023-07-24 ENCOUNTER — Encounter: Payer: Self-pay | Admitting: Internal Medicine

## 2023-07-24 ENCOUNTER — Other Ambulatory Visit: Payer: Medicare Other

## 2023-07-24 ENCOUNTER — Ambulatory Visit (INDEPENDENT_AMBULATORY_CARE_PROVIDER_SITE_OTHER): Payer: Medicare Other | Admitting: Nurse Practitioner

## 2023-07-24 VITALS — BP 154/81 | HR 74 | Resp 18

## 2023-07-24 DIAGNOSIS — I890 Lymphedema, not elsewhere classified: Secondary | ICD-10-CM

## 2023-07-24 DIAGNOSIS — E7800 Pure hypercholesterolemia, unspecified: Secondary | ICD-10-CM | POA: Diagnosis not present

## 2023-07-24 LAB — HEPATIC FUNCTION PANEL
ALT: 32 U/L (ref 0–35)
AST: 51 U/L — ABNORMAL HIGH (ref 0–37)
Albumin: 3.6 g/dL (ref 3.5–5.2)
Alkaline Phosphatase: 74 U/L (ref 39–117)
Bilirubin, Direct: 0.2 mg/dL (ref 0.0–0.3)
Total Bilirubin: 0.7 mg/dL (ref 0.2–1.2)
Total Protein: 6.9 g/dL (ref 6.0–8.3)

## 2023-07-24 LAB — BASIC METABOLIC PANEL
BUN: 20 mg/dL (ref 6–23)
CO2: 41 meq/L — ABNORMAL HIGH (ref 19–32)
Calcium: 9.6 mg/dL (ref 8.4–10.5)
Chloride: 98 meq/L (ref 96–112)
Creatinine, Ser: 0.9 mg/dL (ref 0.40–1.20)
GFR: 55.79 mL/min — ABNORMAL LOW (ref >60.00)
Glucose, Bld: 96 mg/dL (ref 70–99)
Potassium: 3.5 meq/L (ref 3.5–5.1)
Sodium: 145 meq/L (ref 135–145)

## 2023-07-24 LAB — LIPID PANEL
Cholesterol: 126 mg/dL (ref 0–200)
HDL: 51.7 mg/dL (ref >39.00)
LDL Cholesterol: 64 mg/dL (ref 0–99)
NonHDL: 74.55
Total CHOL/HDL Ratio: 2
Triglycerides: 51 mg/dL (ref 0.0–149.0)
VLDL: 10.2 mg/dL (ref 0.0–40.0)

## 2023-07-24 NOTE — Telephone Encounter (Signed)
Signed.

## 2023-07-24 NOTE — Telephone Encounter (Signed)
Bethann [employer] is aware & working on this one

## 2023-07-24 NOTE — Telephone Encounter (Signed)
See phone note

## 2023-07-24 NOTE — Telephone Encounter (Signed)
Son aware. Placed up front for pick up

## 2023-07-24 NOTE — Telephone Encounter (Signed)
Form completed for your signature

## 2023-07-24 NOTE — Progress Notes (Signed)
Patient was removed from [employer] today.  Advised to utilize compression socks and will return at follow-up in 2 weeks

## 2023-07-30 ENCOUNTER — Other Ambulatory Visit: Payer: Self-pay

## 2023-07-30 DIAGNOSIS — R7989 Other specified abnormal findings of blood chemistry: Secondary | ICD-10-CM

## 2023-07-30 DIAGNOSIS — R944 Abnormal results of kidney function studies: Secondary | ICD-10-CM

## 2023-07-31 ENCOUNTER — Encounter (INDEPENDENT_AMBULATORY_CARE_PROVIDER_SITE_OTHER): Payer: Medicare Other

## 2023-08-07 ENCOUNTER — Encounter (INDEPENDENT_AMBULATORY_CARE_PROVIDER_SITE_OTHER): Payer: Medicare Other | Admitting: Nurse Practitioner

## 2023-08-13 ENCOUNTER — Other Ambulatory Visit (INDEPENDENT_AMBULATORY_CARE_PROVIDER_SITE_OTHER): Payer: Medicare Other

## 2023-08-13 DIAGNOSIS — R7989 Other specified abnormal findings of blood chemistry: Secondary | ICD-10-CM

## 2023-08-13 DIAGNOSIS — R944 Abnormal results of kidney function studies: Secondary | ICD-10-CM | POA: Diagnosis not present

## 2023-08-13 LAB — BASIC METABOLIC PANEL
BUN: 20 mg/dL (ref 6–23)
CO2: 43 meq/L — ABNORMAL HIGH (ref 19–32)
Calcium: 10.4 mg/dL (ref 8.4–10.5)
Chloride: 96 meq/L (ref 96–112)
Creatinine, Ser: 1.18 mg/dL (ref 0.40–1.20)
GFR: 40.3 mL/min — ABNORMAL LOW (ref >60.00)
Glucose, Bld: 90 mg/dL (ref 70–99)
Potassium: 3.2 meq/L — ABNORMAL LOW (ref 3.5–5.1)
Sodium: 144 meq/L (ref 135–145)

## 2023-08-13 LAB — HEPATIC FUNCTION PANEL
ALT: 26 U/L (ref 0–35)
AST: 44 U/L — ABNORMAL HIGH (ref 0–37)
Albumin: 3.4 g/dL — ABNORMAL LOW (ref 3.5–5.2)
Alkaline Phosphatase: 69 U/L (ref 39–117)
Bilirubin, Direct: 0.2 mg/dL (ref 0.0–0.3)
Total Bilirubin: 0.9 mg/dL (ref 0.2–1.2)
Total Protein: 6.7 g/dL (ref 6.0–8.3)

## 2023-08-14 ENCOUNTER — Telehealth: Payer: Self-pay

## 2023-08-14 NOTE — Telephone Encounter (Signed)
-----   Message from [place] sent at 08/14/2023  3:56 AM EST ----- Notify - potassium has decreased.  Kidney function decreased from last check.  Need to know how she is taking her lasix (dose and frequency).  May need to adjust dose and add potassium, but need to clarify how much she is taking. One liver test slightly elevated, but stable.  Remainder of liver panel wnl.

## 2023-08-14 NOTE — Telephone Encounter (Signed)
Son is aware of results. LM for patient. When she returns call, please ask her what dose of furosemide (LASIX) she is taking and how often she is taking it.

## 2023-08-15 ENCOUNTER — Other Ambulatory Visit: Payer: Self-pay | Admitting: Internal Medicine

## 2023-08-15 ENCOUNTER — Other Ambulatory Visit: Payer: Self-pay

## 2023-08-15 ENCOUNTER — Encounter: Payer: Self-pay | Admitting: *Deleted

## 2023-08-15 DIAGNOSIS — E876 Hypokalemia: Secondary | ICD-10-CM

## 2023-08-15 MED ORDER — POTASSIUM CHLORIDE ER 10 MEQ PO TBCR: 10.0000 meq | EXTENDED_RELEASE_TABLET | Freq: Every day | ORAL | 0 refills | Status: DC

## 2023-08-19 NOTE — Patient Instructions (Addendum)
SURGICAL WAITING ROOM VISITATION  Patients having surgery or a procedure may have no more than 2 support people in the waiting area - these visitors may rotate.    Children under the age of 63 must have an adult with them who is not the patient.   If the patient needs to stay at the hospital during part of their recovery, the visitor guidelines for inpatient rooms apply. Pre-op nurse will coordinate an appropriate time for 1 support person to accompany patient in pre-op.  This support person may not rotate.    Please refer to the [facility] website for the visitor guidelines for Inpatients (after your surgery is over and you are in a regular room).       Your procedure is scheduled on: 09-04-23   Report to [facility] Main Entrance    Report to admitting at      0615  AM   Call this number if you have problems the morning of surgery [phone]   FOLLOW A CLEAR LIQUID DIET THE DAY OF YOUR BOWEL PREP TO PREVENT DEHYDRATION              FOLLOW BOWEL PREP PER SURGEON OFFICE    you may have the following liquids until _0530_____ AM  DAY OF SURGERY    then nothing by mouth  Water Non-Citrus Juices (without pulp, NO RED-Apple, White grape, White cranberry) Black Coffee (NO MILK/CREAM OR CREAMERS, sugar ok)  Clear Tea (NO MILK/CREAM OR CREAMERS, sugar ok) regular and decaf                             Plain Jell-O (NO RED)                                           Fruit ices (not with fruit pulp, NO RED)                                     Popsicles (NO RED)                                                               Sports drinks like Gatorade (NO RED)              Drink 2 Ensure AT 10:00 PM the night before surgery.        The day of surgery:  Drink ONE (1) Pre-Surgery Clear Ensure  at    0515  AM the morning of surgery. Drink in one sitting. Do not sip.  This drink was given to you during your hospital  pre-op appointment visit. Nothing else to drink after  completing the  Pre-Surgery Clear Ensure by 0530 am .          If you have questions, please contact your surgeon's office.   FOLLOW BOWEL PREP AND ANY ADDITIONAL PRE OP INSTRUCTIONS YOU RECEIVED FROM YOUR SURGEON'S OFFICE!!!     Oral Hygiene is also important to reduce your risk of infection.  Remember - BRUSH YOUR TEETH THE MORNING OF SURGERY WITH YOUR REGULAR TOOTHPASTE  DENTURES WILL BE REMOVED PRIOR TO SURGERY PLEASE DO NOT APPLY "Poly grip" OR ADHESIVES!!!   Do NOT smoke after Midnight   Stop all vitamins and herbal supplements 7 days before surgery.   Take these medicines the morning of surgery with A SIP OF WATER:  nebulizer, levothyroxine, inhaler and bring with you, carvedilol, atorvastatin, amlodipine                                You may not have any metal on your body including hair pins, jewelry, and body piercing             Do not wear make-up, lotions, powders, perfumes/cologne, or deodorant  Do not wear nail polish including gel and S&S, artificial/acrylic nails, or any other type of covering on natural nails including finger and toenails. If you have artificial nails, gel coating, etc. that needs to be removed by a nail salon please have this removed prior to surgery or surgery may need to be canceled/ delayed if the surgeon/ anesthesia feels like they are unable to be safely monitored.   Do not shave  48 hours prior to surgery.                Do not bring valuables to the hospital. [facility] IS NOT             RESPONSIBLE   FOR VALUABLES.   Contacts, glasses, dentures or bridgework may not be worn into surgery.   Bring small overnight bag day of surgery.   DO NOT BRING YOUR HOME MEDICATIONS TO THE HOSPITAL. PHARMACY WILL DISPENSE MEDICATIONS LISTED ON YOUR MEDICATION LIST TO YOU DURING YOUR ADMISSION IN THE HOSPITAL!    Patients discharged on the day of surgery will not be allowed to drive home.  Someone NEEDS to stay  with you for the first 24 hours after anesthesia.   Special Instructions: Bring a copy of your healthcare power of attorney and living will documents the day of surgery if you haven't scanned them before.              Please read over the following fact sheets you were given: IF YOU HAVE QUESTIONS ABOUT YOUR PRE-OP INSTRUCTIONS PLEASE CALL [phone]   IIf you test positive for Covid or have been in contact with anyone that has tested positive in the last 10 days please notify you surgeon.    [facility] - Preparing for Surgery Before surgery, you can play an important role.  Because skin is not sterile, your skin needs to be as free of germs as possible.  You can reduce the number of germs on your skin by washing with CHG (chlorahexidine gluconate) soap before surgery.  CHG is an antiseptic cleaner which kills germs and bonds with the skin to continue killing germs even after washing. Please DO NOT use if you have an allergy to CHG or antibacterial soaps.  If your skin becomes reddened/irritated stop using the CHG and inform your nurse when you arrive at Short Stay. Do not shave (including legs and underarms) for at least 48 hours prior to the first CHG shower.  You may shave your face/neck. Please follow these instructions carefully:  1.  Shower with CHG Soap the night before surgery and the  morning of Surgery.  2.  If you choose to wash your  hair, wash your hair first as usual with your  normal  shampoo.  3.  After you shampoo, rinse your hair and body thoroughly to remove the  shampoo.                           4.  Use CHG as you would any other liquid soap.  You can apply chg directly  to the skin and wash                       Gently with a scrungie or clean washcloth.  5.  Apply the CHG Soap to your body ONLY FROM THE NECK DOWN.   Do not use on face/ open                           Wound or open sores. Avoid contact with eyes, ears mouth and genitals (private parts).                        Wash face,  Genitals (private parts) with your normal soap.             6.  Wash thoroughly, paying special attention to the area where your surgery  will be performed.  7.  Thoroughly rinse your body with warm water from the neck down.  8.  DO NOT shower/wash with your normal soap after using and rinsing off  the CHG Soap.                9.  Pat yourself dry with a clean towel.            10.  Wear clean pajamas.            11.  Place clean sheets on your bed the night of your first shower and do not  sleep with pets. Day of Surgery : Do not apply any lotions/deodorants the morning of surgery.  Please wear clean clothes to the hospital/surgery center.  FAILURE TO FOLLOW THESE INSTRUCTIONS MAY RESULT IN THE CANCELLATION OF YOUR SURGERY PATIENT SIGNATURE_________________________________  NURSE SIGNATURE__________________________________  ________________________________________________________________________  Alexa Pham  An incentive spirometer is a tool that can help keep your lungs clear and active. This tool measures how well you are filling your lungs with each breath. Taking long deep breaths may help reverse or decrease the chance of developing breathing (pulmonary) problems (especially infection) following: A long period of time when you are unable to move or be active. BEFORE THE PROCEDURE  If the spirometer includes an indicator to show your best effort, your nurse or respiratory therapist will set it to a desired goal. If possible, sit up straight or lean slightly forward. Try not to slouch. Hold the incentive spirometer in an upright position. INSTRUCTIONS FOR USE  Sit on the edge of your bed if possible, or sit up as far as you can in bed or on a chair. Hold the incentive spirometer in an upright position. Breathe out normally. Place the mouthpiece in your mouth and seal your lips tightly around it. Breathe in slowly and as deeply as possible, raising the piston  or the ball toward the top of the column. Hold your breath for 3-5 seconds or for as long as possible. Allow the piston or ball to fall to the bottom of the column. Remove the mouthpiece from your mouth and breathe out  normally. Rest for a few seconds and repeat Steps 1 through 7 at least 10 times every 1-2 hours when you are awake. Take your time and take a few normal breaths between deep breaths. The spirometer may include an indicator to show your best effort. Use the indicator as a goal to work toward during each repetition. After each set of 10 deep breaths, practice coughing to be sure your lungs are clear. If you have an incision (the cut made at the time of surgery), support your incision when coughing by placing a pillow or rolled up towels firmly against it. Once you are able to get out of bed, walk around indoors and cough well. You may stop using the incentive spirometer when instructed by your caregiver.  RISKS AND COMPLICATIONS Take your time so you do not get dizzy or light-headed. If you are in pain, you may need to take or ask for pain medication before doing incentive spirometry. It is harder to take a deep breath if you are having pain. AFTER USE Rest and breathe slowly and easily. It can be helpful to keep track of a log of your progress. Your caregiver can provide you with a simple table to help with this. If you are using the spirometer at home, follow these instructions: SEEK MEDICAL CARE IF:  You are having difficultly using the spirometer. You have trouble using the spirometer as often as instructed. Your pain medication is not giving enough relief while using the spirometer. You develop fever of 100.5 F (38.1 C) or higher. SEEK IMMEDIATE MEDICAL CARE IF:  You cough up bloody sputum that had not been present before. You develop fever of 102 F (38.9 C) or greater. You develop worsening pain at or near the incision site. MAKE SURE YOU:  Understand these  instructions. Will watch your condition. Will get help right away if you are not doing well or get worse. Document Released: 02/04/2007 Document Revised: 12/17/2011 Document Reviewed: 04/07/2007 Exit[employer] Patient Information 2014 ExitCare, [place].   ________________________________________________________________________ WHAT IS A BLOOD TRANSFUSION? Blood Transfusion Information  A transfusion is the replacement of blood or some of its parts. Blood is made up of multiple cells which provide different functions. Red blood cells carry oxygen and are used for blood loss replacement. White blood cells fight against infection. Platelets control bleeding. Plasma helps clot blood. Other blood products are available for specialized needs, such as hemophilia or other clotting disorders. BEFORE THE TRANSFUSION  Who gives blood for transfusions?  Healthy volunteers who are fully evaluated to make sure their blood is safe. This is blood bank blood. Transfusion therapy is the safest it has ever been in the practice of medicine. Before blood is taken from a donor, a complete history is taken to make sure that person has no history of diseases nor engages in risky social behavior (examples are intravenous drug use or sexual activity with multiple partners). The donor's travel history is screened to minimize risk of transmitting infections, such as malaria. The donated blood is tested for signs of infectious diseases, such as HIV and hepatitis. The blood is then tested to be sure it is compatible with you in order to minimize the chance of a transfusion reaction. If you or a relative donates blood, this is often done in anticipation of surgery and is not appropriate for emergency situations. It takes many days to process the donated blood. RISKS AND COMPLICATIONS Although transfusion therapy is very safe and saves many lives, the main dangers  of transfusion include:  Getting an infectious disease. Developing a  transfusion reaction. This is an allergic reaction to something in the blood you were given. Every precaution is taken to prevent this. The decision to have a blood transfusion has been considered carefully by your caregiver before blood is given. Blood is not given unless the benefits outweigh the risks. AFTER THE TRANSFUSION Right after receiving a blood transfusion, you will usually feel much better and more energetic. This is especially true if your red blood cells have gotten low (anemic). The transfusion raises the level of the red blood cells which carry oxygen, and this usually causes an energy increase. The nurse administering the transfusion will monitor you carefully for complications. HOME CARE INSTRUCTIONS  No special instructions are needed after a transfusion. You may find your energy is better. Speak with your caregiver about any limitations on activity for underlying diseases you may have. SEEK MEDICAL CARE IF:  Your condition is not improving after your transfusion. You develop redness or irritation at the intravenous (IV) site. SEEK IMMEDIATE MEDICAL CARE IF:  Any of the following symptoms occur over the next 12 hours: Shaking chills. You have a temperature by mouth above 102 F (38.9 C), not controlled by medicine. Chest, back, or muscle pain. People around you feel you are not acting correctly or are confused. Shortness of breath or difficulty breathing. Dizziness and fainting. You get a rash or develop hives. You have a decrease in urine output. Your urine turns a dark color or changes to pink, red, or brown. Any of the following symptoms occur over the next 10 days: You have a temperature by mouth above 102 F (38.9 C), not controlled by medicine. Shortness of breath. Weakness after normal activity. The white part of the eye turns yellow (jaundice). You have a decrease in the amount of urine or are urinating less often. Your urine turns a dark color or changes to  pink, red, or brown. Document Released: 09/21/2000 Document Revised: 12/17/2011 Document Reviewed: 05/10/2008 [facility] Patient Information 2014 [place], [place].  _______________________________________________________________________

## 2023-08-19 NOTE — Progress Notes (Addendum)
PCP - [place], MD LOV 06-13-23 epic Cardiologist - LOV 02-20-23 Gloriann Loan , NP [facility] cardiology epic PULM LOV 07-17-23 epic PPM/ICD -  Device Orders -  Rep Notified -   Chest x-ray -  EKG - 06-05-23 epic Stress Test -  ECHO - 02-14-22 epic Cardiac Cath -   Sleep Study -  CPAP -   Fasting Blood Sugar -  Checks Blood Sugar _____ times a day  Blood Thinner Instructions:Plavix stop 5 days prior Aspirin Instructions:81 mg  ERAS Protcol - PRE-SURGERY Ensure    COVID vaccine -yes  Activity--Able to complete ADL's with no CP or SOB Anesthesia review: Pulm HTN, CAD, NSTEMI, HTN, wears o2 at hs only  Patient denies shortness of breath, fever, cough and chest pain at PAT appointment   All instructions explained to the patient, with a verbal understanding of the material. Patient agrees to go over the instructions while at home for a better understanding. Patient also instructed to self quarantine after being tested for COVID-19. The opportunity to ask questions was provided.

## 2023-08-23 ENCOUNTER — Encounter (HOSPITAL_COMMUNITY): Payer: Self-pay

## 2023-08-23 ENCOUNTER — Other Ambulatory Visit (INDEPENDENT_AMBULATORY_CARE_PROVIDER_SITE_OTHER): Payer: Medicare Other

## 2023-08-23 ENCOUNTER — Encounter (HOSPITAL_COMMUNITY)
Admission: RE | Admit: 2023-08-23 | Discharge: 2023-08-23 | Disposition: A | Discharge status: Home / Self Care | Payer: Medicare Other | Source: Ambulatory Visit | Attending: Surgery

## 2023-08-23 ENCOUNTER — Other Ambulatory Visit: Payer: Self-pay

## 2023-08-23 VITALS — BP 155/75 | HR 65 | Temp 98.6°F | Resp 16 | Ht 66.0 in | Wt 111.0 lb

## 2023-08-23 DIAGNOSIS — I10 Essential (primary) hypertension: Secondary | ICD-10-CM | POA: Diagnosis not present

## 2023-08-23 DIAGNOSIS — E876 Hypokalemia: Secondary | ICD-10-CM | POA: Diagnosis not present

## 2023-08-23 DIAGNOSIS — R739 Hyperglycemia, unspecified: Secondary | ICD-10-CM | POA: Diagnosis not present

## 2023-08-23 DIAGNOSIS — Z01818 Encounter for other preprocedural examination: Secondary | ICD-10-CM | POA: Diagnosis present

## 2023-08-23 DIAGNOSIS — Z01812 Encounter for preprocedural laboratory examination: Secondary | ICD-10-CM | POA: Insufficient documentation

## 2023-08-23 HISTORY — DX: Pneumonia, unspecified organism: J18.9

## 2023-08-23 HISTORY — DX: Malignant neoplasm of unspecified site of unspecified female breast: C50.919

## 2023-08-23 HISTORY — DX: Family history of other specified conditions: Z84.89

## 2023-08-23 HISTORY — DX: Unspecified osteoarthritis, unspecified site: M19.90

## 2023-08-23 LAB — BASIC METABOLIC PANEL
BUN: 21 mg/dL (ref 6–23)
CO2: 41 meq/L — ABNORMAL HIGH (ref 19–32)
Calcium: 9.8 mg/dL (ref 8.4–10.5)
Chloride: 96 meq/L (ref 96–112)
Creatinine, Ser: 1.12 mg/dL (ref 0.40–1.20)
GFR: 42.89 mL/min — ABNORMAL LOW (ref >60.00)
Glucose, Bld: 119 mg/dL — ABNORMAL HIGH (ref 70–99)
Potassium: 3.7 meq/L (ref 3.5–5.1)
Sodium: 143 meq/L (ref 135–145)

## 2023-08-23 LAB — TYPE AND SCREEN
ABO/RH(D): O POS
Antibody Screen: NEGATIVE

## 2023-08-23 LAB — CBC
HCT: 37.9 % (ref 36.0–46.0)
Hemoglobin: 11.9 g/dL — ABNORMAL LOW (ref 12.0–15.0)
MCH: 31.3 pg (ref 26.0–34.0)
MCHC: 31.4 g/dL (ref 30.0–36.0)
MCV: 99.7 fL (ref 80.0–100.0)
Platelets: 230 10*3/uL (ref 150–400)
RBC: 3.8 MIL/uL — ABNORMAL LOW (ref 3.87–5.11)
RDW: 14.9 % (ref 11.5–15.5)
WBC: 7 10*3/uL (ref 4.0–10.5)
nRBC: 0 % (ref 0.0–0.2)

## 2023-08-23 LAB — HEMOGLOBIN A1C
Hgb A1c MFr Bld: 5.8 % — ABNORMAL HIGH (ref 4.8–5.6)
Mean Plasma Glucose: 119.76 mg/dL

## 2023-08-23 NOTE — Consult Note (Signed)
WOC Nurse requested for preoperative stoma site marking  Discussed surgical procedure and stoma creation with patient and family.  Explained role of the WOC nurse team.  Provided the patient with educational booklet and provided samples of pouching options.  Answered patient and family questions.  Patient was not familiar or aware of possibility of ostomy, I spent a good deal of time explaining rationale for marking and possible ostomy.  Examined patient sitting and standing in order to place the marking in the patient's visual field, away from any creases or abdominal contour issues and within the rectus muscle.  Attempted to mark below the patient's belt line.   Marked for colostomy in the LLQ  __3__ cm to the left of the umbilicus and __4__cm below the umbilicus.  Marked for ileostomy in the RLQ  _3.5___cm to the right of the umbilicus and  __3__ cm below the umbilicus.     Patient's abdomen cleansed with CHG wipes at site markings, allowed to air dry prior to marking.Covered mark with thin film transparent dressing to preserve mark until date of surgery.   WOC Nurse team will follow up with patient after surgery for continue ostomy care and teaching.  Sumaiya Arruda [facility] MSN, RN,CWOCN, CNS, [employer] [phone]

## 2023-08-26 ENCOUNTER — Telehealth: Payer: Self-pay

## 2023-08-26 ENCOUNTER — Other Ambulatory Visit: Payer: Self-pay

## 2023-08-26 DIAGNOSIS — E876 Hypokalemia: Secondary | ICD-10-CM

## 2023-08-26 NOTE — Telephone Encounter (Signed)
 Lvm for pt to give office a call back in regards to labs, see message below

## 2023-08-26 NOTE — Progress Notes (Signed)
DISCUSSION: Alexa Pham is a 89 yo female who presents to PAT prior to ROBOTIC RECTOPEXY, RESECTION OF RECTOSIGMOID COLON and RIGID PROCTOSCOPY on 09/04/23 with Dr. Michaell Cowing. PMH of former smoking, hx of MI, CAD, aortic atherosclerosis, CHF, asthma, bronchiectasis, pulmonary HTN, DOE, hx of breast cancer s/p lumpectomy and XRT, s/p thyroidectomy, post procedure hypothyroidism, arthritis. Of note patient is also scheduled for colonoscopy at [facility] on 11/26.  Patient follows with Cardiology for hx of CAD s/p PCI in 2021, CHF, HTN. Last seen in clinic on 02/20/23. Denied any anginal symptoms but did have increase in LE edema and was continued on Lasix. Cardiac clearance received that no further testing is needed and patient can hold her aspirin for 5 days (paper copy in chart). Of note patient was told she could stop Plavix back in April but is still taking it. She was told she can hold her Plavix for 5 days, presumably by her surgeon. Message sent to her PCP regarding if patient should remain on Plavix long term or not.  She follows with Pulmonology for hx of asthma/bronchiectasis, pulmonary HTN, and DOE. Stable on inhalers. Wears 2L at night for nocturnal hypoxemia. Last seen on 07/01/23 for pre-op clearance at [facility]. Also seen by [facility] pulmonology on 07/17/23. 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 She has not had an asthma flare this year, but has had active treatment for fluid overload in the setting of CHF. She is not on oxygen during the day and in April 2024 had a 6-minute walk where she was able to walk 70% of predicted distance for her age, without desaturation, showing  mild functional impairment.   While she is an elevated risk of perioperative pulmonary complications, I do not believe these are prohibitive when considering the quality of life impact of persistent rectal prolapse.  I think she needs a cardiac evaluation prior to consideration of elective general anesthesia, but does not have significant contraindications from pulmonary standpoint"  Patient follows with Vascular for lymphedema. Last seen on 07/10/23. Using Unna boots and advised to elevate legs.  VS: BP (!) 155/75   Pulse 65   Temp 37 C (Oral)   Resp 16   Ht 5\' 6"  (1.676 m)   Wt 50.3 kg   SpO2 95%   BMI 17.92 kg/m   PROVIDERS: PCP - Dale [place], MD LOV 06-13-23 epic Cardiologist - Elige Radon, MD. ([facility]) Pulmonology (has 2?): Victorino December, MD ([facility]), Sarina Ser, MD ()  LABS: Labs reviewed: Acceptable for surgery. (all labs ordered are listed, but only abnormal results are displayed)  Labs Reviewed  CBC - Abnormal; Notable for the following components:      Result Value   RBC 3.80 (*)    Hemoglobin 11.9 (*)    All other components within normal limits  HEMOGLOBIN A1C - Abnormal; Notable for the following components:   Hgb A1c MFr Bld 5.8 (*)    All other components within normal limits  TYPE AND SCREEN     IMAGES:  CT chest 03/05/23:  IMPRESSION: 1.  Scattered bilateral nodular opacities with associated bronchiectasis, most prominent in the right middle lobe and  lingula. Findings most suggestive of chronic endobronchial infection, particularly nontuberculous mycobacterium. Comparison with outside priors to be helpful to assess stability. Alternatively, consider a three-month follow-up CT. 2.  Enlarged main and bilateral pulmonary arteries, suggestive of elevated pulmonary arterial pressures. 3.  Small left pleural effusion and trace right pleural effusion.  12/14/2022 overnight oximetry on room air:   Patient exhibited desaturations as  low as 68%.  Please note that the study states that it was done on 06 September 2008, this is erroneous.  Testing was done in March 2024.  12/20/2022 PFTs:   FEV1 0.88 L or 53% predicted, FVC 1.37 L or 61% of predicted, FEV1/FVC 64%, there is a 32% net change post bronchodilator with FEV1 improving to 1.17 L or 71% predicted after bronchodilator.  Lung volumes were mildly reduced.  Could not perform diffusion maneuvers.  Consistent with obstructive airways disease with reversibility, likely concomitant restriction.  Diffusion capacity could not be obtained due to patient's inability to perform the maneuvers.  EKG:   CV:  Echo 10/16/2022:  INTERPRETATION ---------------------------------------------------------------    NORMAL LEFT VENTRICULAR SYSTOLIC FUNCTION    NORMAL RIGHT VENTRICULAR SYSTOLIC FUNCTION    VALVULAR REGURGITATION: MILD AR, TRIVIAL MR, MODERATE PR, MILD TR    NO VALVULAR STENOSIS    ESTIMATED RVSP=    3D acquisition and reconstructions were performed as part of this    examination to more accurately quantify the effects of identified    structural abnormalities as part of the exam. (post-processing on an    Independent workstation).     Compared with prior Echo study on 06/28/2020: ARRHYTHMIA DURING STUDY    (PAC's)    NO OTHER SIGNIFICANT CHANGE      Past Medical History:  Diagnosis Date   Actinic keratosis 01/28/2020   R thumb proximal nail fold    Arthritis    Asthma    Atherosclerosis of abdominal aorta (HCC)    Atypical mole 05/13/2018   R distal lat thigh   Atypical mole 09/14/2014   left ant mid thigh   Atypical mole 01/19/2014   left paraspinal mid to upper back   Atypical mole 01/19/2014   right back mid to inf scapula   Atypical mole 01/19/2014   left posterior deltoid medial   Basal cell carcinoma 01/28/2020   left of midline mid dorsum nose   Basal cell carcinoma 01/06/2018   left preauricular    Basal cell carcinoma 11/22/2016    left upper lip   Basal cell carcinoma 03/17/2015   left lateral elbow   Basal cell carcinoma 07/22/2014   R medial inferior knee   Basal cell carcinoma 07/21/2020   Left dorsum hand. Nodular pattern. Excised 08/09/2020, margins free.   Basal cell carcinoma 02/23/2021   right dorsal hand (BCC/SCC), left dorsal hand, right chin - treated with EDC   Breast cancer (HCC)    Left breast lumpectomy with radiation   Bronchiectasis (HCC)    CHF (congestive heart failure) (HCC)    Chronic anticoagulation    Coronary artery disease    Dyspnea on exertion    Family history of adverse reaction to anesthesia    son naseau   Family history of breast cancer    H/O partial mastectomy    LEFT   History of breast cancer    History of pneumonia    History of shingles    History of thyroidectomy, total    Hyperlipidemia    Hypertension    Hypomagnesemia  Hypothyroidism    Macular degeneration    Myocardial infarction [facility])    Pneumonia    Pulmonary hypertension (HCC)    Rectal prolapse    Renal mass, left    Right ventricular enlargement    Squamous cell carcinoma of skin 01/06/2018   right dorsum hand at ring finger MCP   Squamous cell carcinoma of skin 07/08/2017   left distal dorsum forearm    SUI (stress urinary incontinence, female)    Urge incontinence     Past Surgical History:  Procedure Laterality Date   ABDOMINAL HYSTERECTOMY     ABDOMINAL HYSTERECTOMY     APPENDECTOMY     BREAST LUMPECTOMY     CATARACT EXTRACTION, BILATERAL     CHOLECYSTECTOMY     CORONARY STENT INTERVENTION N/A 07/26/2020   Procedure: CORONARY STENT INTERVENTION;  Surgeon: Marcina Millard, MD;  Location: [facility] INVASIVE CV LAB;  Service: Cardiovascular;  Laterality: N/A;   LEFT HEART CATH AND CORONARY ANGIOGRAPHY Left 07/26/2020   Procedure: LEFT HEART CATH AND CORONARY ANGIOGRAPHY;  Surgeon: Lamar Blinks, MD;  Location: [facility] INVASIVE CV LAB;  Service: Cardiovascular;  Laterality: Left;    REPLACEMENT TOTAL KNEE Left    TONSILLECTOMY     TONSILLECTOMY AND ADENOIDECTOMY      MEDICATIONS:  albuterol (PROVENTIL) (2.5 MG/3ML) 0.083% nebulizer solution   amLODipine (NORVASC) 2.5 MG tablet   aspirin EC 81 MG EC tablet   atorvastatin (LIPITOR) 40 MG tablet   calcium citrate-vitamin D (CITRACAL+D) 315-200 MG-UNIT tablet   carvedilol (COREG) 6.25 MG tablet   clopidogrel (PLAVIX) 75 MG tablet   Fluticasone-Umeclidin-Vilant (TRELEGY ELLIPTA) 100-62.5-25 MCG/ACT AEPB   furosemide (LASIX) 20 MG tablet   irbesartan (AVAPRO) 150 MG tablet   levothyroxine (SYNTHROID) 150 MCG tablet   Multiple Vitamins-Minerals (PRESERVISION AREDS 2 PO)   potassium chloride (KLOR-CON) 10 MEQ tablet   sodium chloride HYPERTONIC 3 % nebulizer solution   No current facility-administered medications for this encounter.   Marcille Blanco [facility] Surgical Short Stay/Anesthesiology [facility] Phone [phone] 08/28/2023 9:11 AM

## 2023-08-26 NOTE — Telephone Encounter (Signed)
-----   Message from [place] sent at 08/24/2023  2:25 PM EST ----- Notify - kidney function has improved from last check.  Still decreased, but slightly improved. Potassium wnl. Continue current potassium supplements we will continue to follow.  Confirm breathing stable.  Recheck potassium in 2 weeks  to confirm stable.  (Potassium only).

## 2023-08-27 ENCOUNTER — Ambulatory Visit: Payer: Medicare Other | Admitting: Internal Medicine

## 2023-08-27 ENCOUNTER — Encounter: Payer: Self-pay | Admitting: *Deleted

## 2023-08-27 ENCOUNTER — Telehealth: Payer: Self-pay

## 2023-08-27 VITALS — BP 134/72 | HR 87 | Temp 98.0°F | Resp 16 | Ht 66.0 in | Wt 111.0 lb

## 2023-08-27 DIAGNOSIS — I2510 Atherosclerotic heart disease of native coronary artery without angina pectoris: Secondary | ICD-10-CM

## 2023-08-27 DIAGNOSIS — Z01818 Encounter for other preprocedural examination: Secondary | ICD-10-CM

## 2023-08-27 DIAGNOSIS — I272 Pulmonary hypertension, unspecified: Secondary | ICD-10-CM | POA: Diagnosis not present

## 2023-08-27 DIAGNOSIS — E039 Hypothyroidism, unspecified: Secondary | ICD-10-CM | POA: Diagnosis not present

## 2023-08-27 DIAGNOSIS — I10 Essential (primary) hypertension: Secondary | ICD-10-CM

## 2023-08-27 DIAGNOSIS — K623 Rectal prolapse: Secondary | ICD-10-CM | POA: Diagnosis not present

## 2023-08-27 DIAGNOSIS — J479 Bronchiectasis, uncomplicated: Secondary | ICD-10-CM

## 2023-08-27 DIAGNOSIS — E7800 Pure hypercholesterolemia, unspecified: Secondary | ICD-10-CM

## 2023-08-27 DIAGNOSIS — R600 Localized edema: Secondary | ICD-10-CM

## 2023-08-27 DIAGNOSIS — I700 Atherosclerosis of aorta: Secondary | ICD-10-CM

## 2023-08-27 NOTE — Telephone Encounter (Signed)
LM for son. She is on blood thinner (plavix). Pt is scheduled with Dr Lorin Picket to discuss this today at 4 pm.

## 2023-08-27 NOTE — Telephone Encounter (Signed)
Patient's son, Kathye Gehlbach, called to state patient is going in for a colonoscopy in a few days and was told to stop taking her blood thinner.  Molly Maduro states he looked at patient's medication list and he does not see a blood thinner.  Molly Maduro states he would like for [employer], LPN, to call him.

## 2023-08-27 NOTE — Telephone Encounter (Signed)
Patient's son, [name], states he is returning a call from [name], LPN.  I read [name]'s message to Naranja.

## 2023-08-27 NOTE — Progress Notes (Signed)
Subjective:    [name], female    DOB: 03-28-1930, 89 y.o.   MRN: [id number]  Patient here for  Chief Complaint  Patient presents with   Pre-op Exam    HPI Planning surgery for rectal prolapse.  She reports she feels breathing is stable.  No increased sob. No cough or congestion.  Swelling is better. No chest pain. Eating.  Bowels are moving. Not having as much pain from the rectal prolapse currently. Reviewed records.  She has been instructed on medications to stop and when to stop them.     Past Medical History:  Diagnosis Date   Actinic keratosis 01/28/2020   R thumb proximal nail fold    Arthritis    Asthma    Atherosclerosis of abdominal aorta (HCC)    Atypical mole 05/13/2018   R distal lat thigh   Atypical mole 09/14/2014   left ant mid thigh   Atypical mole 01/19/2014   left paraspinal mid to upper back   Atypical mole 01/19/2014   right back mid to inf scapula   Atypical mole 01/19/2014   left posterior deltoid medial   Basal cell carcinoma 01/28/2020   left of midline mid dorsum nose   Basal cell carcinoma 01/06/2018   left preauricular    Basal cell carcinoma 11/22/2016   left upper lip   Basal cell carcinoma 03/17/2015   left lateral elbow   Basal cell carcinoma 07/22/2014   R medial inferior knee   Basal cell carcinoma 07/21/2020   Left dorsum hand. Nodular pattern. Excised 08/09/2020, margins free.   Basal cell carcinoma 02/23/2021   right dorsal hand (BCC/SCC), left dorsal hand, right chin - treated with EDC   Breast cancer (HCC)    Left breast lumpectomy with radiation   Bronchiectasis (HCC)    CHF (congestive heart failure) (HCC)    Chronic anticoagulation    Coronary artery disease    Dyspnea on exertion    Family history of adverse reaction to anesthesia    son naseau   Family history of breast cancer    H/O partial mastectomy    LEFT   History of breast cancer    History of pneumonia    History of shingles    History of  thyroidectomy, total    Hyperlipidemia    Hypertension    Hypomagnesemia    Hypothyroidism    Macular degeneration    Myocardial infarction (HCC)    Pneumonia    Pulmonary hypertension (HCC)    Rectal prolapse    Renal mass, left    Right ventricular enlargement    Squamous cell carcinoma of skin 01/06/2018   right dorsum hand at ring finger MCP   Squamous cell carcinoma of skin 07/08/2017   left distal dorsum forearm    SUI (stress urinary incontinence, female)    Urge incontinence    Past Surgical History:  Procedure Laterality Date   ABDOMINAL HYSTERECTOMY     ABDOMINAL HYSTERECTOMY     APPENDECTOMY     BREAST LUMPECTOMY     CATARACT EXTRACTION, BILATERAL     CHOLECYSTECTOMY     CORONARY STENT INTERVENTION N/A 07/26/2020   Procedure: CORONARY STENT INTERVENTION;  Surgeon: Marcina Millard, MD;  Location: [facility] INVASIVE CV LAB;  Service: Cardiovascular;  Laterality: N/A;   LEFT HEART CATH AND CORONARY ANGIOGRAPHY Left 07/26/2020   Procedure: LEFT HEART CATH AND CORONARY ANGIOGRAPHY;  Surgeon: Lamar Blinks, MD;  Location: [facility] INVASIVE CV LAB;  Service: Cardiovascular;  Laterality: Left;   REPLACEMENT TOTAL KNEE Left    TONSILLECTOMY     TONSILLECTOMY AND ADENOIDECTOMY     Family History  Problem Relation Age of Onset   CAD Mother    Cancer Maternal Aunt        unk type   Leukemia Maternal Grandmother    Breast cancer Half-Sister        dx 81s   Social History   Socioeconomic History   Marital status: Widowed    Spouse name: Not on file   Number of children: Not on file   Years of education: Not on file   Highest education level: Not on file  Occupational History   Not on file  Tobacco Use   Smoking status: Former    Current packs/day: 0.00    Average packs/day: 1.5 packs/day for 6.0 years (9.0 ttl pk-yrs)    Types: Cigarettes    Start date: 11    Quit date: 62    Years since quitting: 49.9   Smokeless tobacco: Never  Vaping Use   Vaping  status: Never Used  Substance and Sexual Activity   Alcohol use: Yes    Comment: very rare   Drug use: Never   Sexual activity: Not Currently  Other Topics Concern   Not on file  Social History Narrative   Married    Social Determinants of Health   Financial Resource Strain: Low Risk  (10/22/2022)   Overall Financial Resource Strain (CARDIA)    Difficulty of Paying Living Expenses: Not hard at all  Food Insecurity: No Food Insecurity (10/22/2022)   Hunger Vital Sign    Worried About Running Out of Food in the Last Year: Never true    Ran Out of Food in the Last Year: Never true  Transportation Needs: No Transportation Needs (10/22/2022)   PRAPARE - Administrator, Civil Service (Medical): No    Lack of Transportation (Non-Medical): No  Physical Activity: Insufficiently Active (10/22/2022)   Exercise Vital Sign    Days of Exercise per Week: 2 days    Minutes of Exercise per Session: 40 min  Stress: No Stress Concern Present (10/22/2022)   [employer] of Occupational Health - Occupational Stress Questionnaire    Feeling of Stress : Not at all  Social Connections: Moderately Integrated (10/22/2022)   Social Connection and Isolation Panel [NHANES]    Frequency of Communication with Friends and Family: More than three times a week    Frequency of Social Gatherings with Friends and Family: More than three times a week    Attends Religious Services: More than 4 times per year    Active Member of [employer] or Organizations: Yes    Attends Banker Meetings: More than 4 times per year    Marital Status: Widowed     Review of Systems  Constitutional:  Negative for appetite change and unexpected weight change.  HENT:  Negative for congestion and sinus pressure.   Respiratory:  Negative for cough and chest tightness.        Breathing stable.  No increased sob.   Cardiovascular:  Negative for chest pain and palpitations.       Still with pedal and lower extremity  swelling - improved.  Compression hose in place.   Gastrointestinal:  Negative for abdominal pain, diarrhea, nausea and vomiting.  Genitourinary:  Negative for difficulty urinating and dysuria.  Musculoskeletal:  Negative for joint swelling and myalgias.  Skin:  Negative for color change and rash.  Neurological:  Negative for dizziness and headaches.  Psychiatric/Behavioral:  Negative for agitation and dysphoric mood.        Objective:     BP 134/72   Pulse 87   Temp 98 F (36.7 C)   Resp 16   Ht 5\' 6"  (1.676 m)   Wt 111 lb (50.3 kg)   SpO2 97%   BMI 17.92 kg/m  Wt Readings from Last 3 Encounters:  08/27/23 111 lb (50.3 kg)  08/23/23 111 lb (50.3 kg)  07/17/23 116 lb 12.8 oz (53 kg)    Physical Exam Vitals reviewed.  Constitutional:      General: She is not in acute distress.    Appearance: Normal appearance.  HENT:     Head: Normocephalic and atraumatic.     Right Ear: External ear normal.     Left Ear: External ear normal.  Eyes:     General: No scleral icterus.       Right eye: No discharge.        Left eye: No discharge.     Conjunctiva/sclera: Conjunctivae normal.  Neck:     Thyroid: No thyromegaly.  Cardiovascular:     Rate and Rhythm: Normal rate and regular rhythm.  Pulmonary:     Effort: No respiratory distress.     Breath sounds: Normal breath sounds. No wheezing.  Abdominal:     General: Bowel sounds are normal.     Palpations: Abdomen is soft.     Tenderness: There is no abdominal tenderness.  Musculoskeletal:        General: No tenderness.     Cervical back: Neck supple. No tenderness.     Comments: Pedal and lower extremity swelling - improved.    Lymphadenopathy:     Cervical: No cervical adenopathy.  Skin:    Findings: No erythema or rash.  Neurological:     Mental Status: She is alert.  Psychiatric:        Mood and Affect: Mood normal.        Behavior: Behavior normal.      Outpatient Encounter Medications as of 08/27/2023   Medication Sig   albuterol (PROVENTIL) (2.5 MG/3ML) 0.083% nebulizer solution Take 2.5 mg by nebulization 2 (two) times daily.   amLODipine (NORVASC) 2.5 MG tablet Take 2.5 mg by mouth 2 (two) times daily.   aspirin EC 81 MG EC tablet Take 1 tablet (81 mg total) by mouth daily. Swallow whole.   atorvastatin (LIPITOR) 40 MG tablet TAKE 1 TABLET(40 MG) BY MOUTH DAILY   calcium citrate-vitamin D (CITRACAL+D) 315-200 MG-UNIT tablet Take 1 tablet by mouth 2 (two) times daily.   carvedilol (COREG) 6.25 MG tablet Take 6.25 mg by mouth 2 (two) times daily with a meal.   clopidogrel (PLAVIX) 75 MG tablet Take 1 tablet (75 mg total) by mouth daily.   Fluticasone-Umeclidin-Vilant (TRELEGY ELLIPTA) 100-62.5-25 MCG/ACT AEPB Inhale 1 puff into the lungs daily.   furosemide (LASIX) 20 MG tablet Take by mouth.   irbesartan (AVAPRO) 150 MG tablet Take 1 tablet (150 mg total) by mouth daily. (Patient taking differently: Take 150 mg by mouth in the morning and at bedtime.)   levothyroxine (SYNTHROID) 150 MCG tablet Take 1 tablet (150 mcg total) by mouth daily.   Multiple Vitamins-Minerals (PRESERVISION AREDS 2 PO) Take 1 tablet by mouth 2 (two) times daily.    potassium chloride (KLOR-CON) 10 MEQ tablet TAKE 1 TABLET(10 MEQ) BY MOUTH DAILY (Patient taking  differently: Take 10 mEq by mouth daily.)   sodium chloride HYPERTONIC 3 % nebulizer solution Take 4 mLs by nebulization in the morning and at bedtime.   No facility-administered encounter medications on file as of 08/27/2023.     Lab Results  Component Value Date   WBC 7.0 08/23/2023   HGB 11.9 (L) 08/23/2023   HCT 37.9 08/23/2023   PLT 230 08/23/2023   GLUCOSE 119 (H) 08/23/2023   CHOL 126 07/24/2023   TRIG 51.0 07/24/2023   HDL 51.70 07/24/2023   LDLCALC 64 07/24/2023   ALT 26 08/13/2023   AST 44 (H) 08/13/2023   NA 143 08/23/2023   K 3.7 08/23/2023   CL 96 08/23/2023   CREATININE 1.12 08/23/2023   BUN 21 08/23/2023   CO2 41 (H) 08/23/2023    TSH 2.56 03/11/2023   INR 1.1 02/13/2022   HGBA1C 5.8 (H) 08/23/2023       Assessment & Plan:  Pre-op exam -     EKG 12-Lead  Rectal prolapse Assessment & Plan: Planning for surgery as outlined.  EKG - SR with no acute ischemic changes.  She is currently feeling well.  Denies any increased sob. No chest pain. Discussed with cardiology.  No further testing prior to surgery. Has been given instructions by surgery team regarding holding plavix.  Breathing currently stable.  Informed would need to continue her inhalers/nebs as she is doing.  Will need to take her nebulizer machine with her to the hospital for continued use. Pulmonary aware. No further recommendations.    Pulmonary hypertension (HCC) Assessment & Plan: Follow up with cardiology and pulmonary.  Overall she has felt breathing stable.    Hypothyroidism, unspecified type Assessment & Plan: On synthroid.  Follow tsh.    Primary hypertension Assessment & Plan: Currently on avapro, coreg and amlodipine. Amlodipine 2.5mg  bid and avapro 150mg  bid. Taking lasix. Follow.     Hypercholesterolemia Assessment & Plan: On lipitor. Follow lipid panel and liver function tests.    Coronary artery disease involving native coronary artery of native heart without angina pectoris Assessment & Plan: Previous admission for NSTEMI.  ECHO with no wall motion abnormality.  EF 60%.  Recommended dual antiplatelet therapy and statin medication.  Per note review, off plavix for surgery. Will confirm with cardiology regarding remaining off plavix after procedure. Plans to resume aspirin therapy.  Continue f/u with cardiology.    Bronchiectasis without complication [facility]) Assessment & Plan: Recent evaluation by pulmonary.  Had pulmonary function tests 12/2022 - findings are consistent mostly with mild obstructive airways disease with reversible component as seen in asthma. Was started on trelegy.  Also started on overnight oxygen - 2LNC. Overall  she feels breathing is stable. Continues nebs bid.  Will need to continue her current regimen in the peri op period.  Take her nebulizer to the hospital.    Bilateral lower extremity edema Assessment & Plan: Continue compression hose.  Swelling improved.    Aortic atherosclerosis (HCC) Assessment & Plan: Continue lipitor.       Dale [place], MD

## 2023-08-28 NOTE — Anesthesia Preprocedure Evaluation (Addendum)
Anesthesia Evaluation  Patient identified by MRN, date of birth, ID band Patient awake    Reviewed: Allergy & Precautions, H&P , NPO status , Patient's Chart, lab work & pertinent test results, reviewed documented beta blocker date and time   History of Anesthesia Complications Negative for: history of anesthetic complications  Airway Mallampati: I  TM Distance: >3 FB Neck ROM: full    Dental  (+) Dental Advidsory Given, Caps, Teeth Intact   Pulmonary shortness of breath and with exertion, asthma , neg COPD, neg recent URI, former smoker   Pulmonary exam normal breath sounds clear to auscultation       Cardiovascular Exercise Tolerance: Good hypertension, pulmonary hypertension(-) angina + CAD, + Past MI, + Cardiac Stents and +CHF (one episode, none since)  (-) CABG Normal cardiovascular exam(-) dysrhythmias (-) Valvular Problems/Murmurs Rhythm:regular Rate:Normal     Neuro/Psych negative neurological ROS  negative psych ROS   GI/Hepatic negative GI ROS, Neg liver ROS,,,  Endo/Other  neg diabetesHypothyroidism    Renal/GU negative Renal ROS  negative genitourinary   Musculoskeletal   Abdominal   Peds  Hematology negative hematology ROS (+)   Anesthesia Other Findings Past Medical History: 01/28/2020: Actinic keratosis     Comment:  R thumb proximal nail fold  No date: Arthritis No date: Asthma No date: Atherosclerosis of abdominal aorta (HCC) 05/13/2018: Atypical mole     Comment:  R distal lat thigh 09/14/2014: Atypical mole     Comment:  left ant mid thigh 01/19/2014: Atypical mole     Comment:  left paraspinal mid to upper back 01/19/2014: Atypical mole     Comment:  right back mid to inf scapula 01/19/2014: Atypical mole     Comment:  left posterior deltoid medial 01/28/2020: Basal cell carcinoma     Comment:  left of midline mid dorsum nose 01/06/2018: Basal cell carcinoma     Comment:  left  preauricular  11/22/2016: Basal cell carcinoma     Comment:  left upper lip 03/17/2015: Basal cell carcinoma     Comment:  left lateral elbow 07/22/2014: Basal cell carcinoma     Comment:  R medial inferior knee 07/21/2020: Basal cell carcinoma     Comment:  Left dorsum hand. Nodular pattern. Excised 08/09/2020,               margins free. 02/23/2021: Basal cell carcinoma     Comment:  right dorsal hand (BCC/SCC), left dorsal hand, right               chin - treated with EDC No date: Breast cancer (HCC)     Comment:  Left breast lumpectomy with radiation No date: Bronchiectasis (HCC) No date: CHF (congestive heart failure) (HCC) No date: Chronic anticoagulation No date: Coronary artery disease No date: Dyspnea on exertion No date: Family history of adverse reaction to anesthesia     Comment:  son naseau No date: Family history of breast cancer No date: H/O partial mastectomy     Comment:  LEFT No date: History of breast cancer No date: History of pneumonia No date: History of shingles No date: History of thyroidectomy, total No date: Hyperlipidemia No date: Hypertension No date: Hypomagnesemia No date: Hypothyroidism No date: Macular degeneration No date: Myocardial infarction (HCC) No date: Pneumonia No date: Pulmonary hypertension (HCC) No date: Rectal prolapse No date: Renal mass, left No date: Right ventricular enlargement 01/06/2018: Squamous cell carcinoma of skin     Comment:  right dorsum hand at ring  finger MCP 07/08/2017: Squamous cell carcinoma of skin     Comment:  left distal dorsum forearm  No date: SUI (stress urinary incontinence, female) No date: Urge incontinence   Reproductive/Obstetrics negative OB ROS                             Anesthesia Physical Anesthesia Plan  ASA: 3  Anesthesia Plan: General   Post-op Pain Management:    Induction: Intravenous  PONV Risk Score and Plan: 3 and Propofol infusion and  TIVA  Airway Management Planned: Natural Airway and Nasal Cannula  Additional Equipment:   Intra-op Plan:   Post-operative Plan:   Informed Consent: I have reviewed the patients History and Physical, chart, labs and discussed the procedure including the risks, benefits and alternatives for the proposed anesthesia with the patient or authorized representative who has indicated his/her understanding and acceptance.     Dental Advisory Given  Plan Discussed with: Anesthesiologist, CRNA and Surgeon  Anesthesia Plan Comments:         Anesthesia Quick Evaluation

## 2023-09-01 ENCOUNTER — Encounter: Payer: Self-pay | Admitting: Internal Medicine

## 2023-09-01 NOTE — Assessment & Plan Note (Signed)
Follow up with cardiology and pulmonary.  Overall she has felt breathing stable.

## 2023-09-01 NOTE — Assessment & Plan Note (Signed)
On lipitor.  Follow lipid panel and liver function tests.

## 2023-09-01 NOTE — Assessment & Plan Note (Signed)
On synthroid.  Follow tsh.

## 2023-09-01 NOTE — Assessment & Plan Note (Signed)
Currently on avapro, coreg and amlodipine. Amlodipine 2.5mg  bid and avapro 150mg  bid. Taking lasix. Follow.

## 2023-09-01 NOTE — Assessment & Plan Note (Signed)
Continue lipitor  ?

## 2023-09-01 NOTE — Assessment & Plan Note (Signed)
Continue compression hose.  Swelling improved.

## 2023-09-01 NOTE — Assessment & Plan Note (Signed)
Recent evaluation by pulmonary.  Had pulmonary function tests 12/2022 - findings are consistent mostly with mild obstructive airways disease with reversible component as seen in asthma. Was started on trelegy.  Also started on overnight oxygen - 2LNC. Overall she feels breathing is stable. Continues nebs bid.  Will need to continue her current regimen in the peri op period.  Take her nebulizer to the hospital.

## 2023-09-01 NOTE — Assessment & Plan Note (Signed)
Planning for surgery as outlined.  EKG - SR with no acute ischemic changes.  She is currently feeling well.  Denies any increased sob. No chest pain. Discussed with cardiology.  No further testing prior to surgery. Has been given instructions by surgery team regarding holding plavix.  Breathing currently stable.  Informed would need to continue her inhalers/nebs as she is doing.  Will need to take her nebulizer machine with her to the hospital for continued use. Pulmonary aware. No further recommendations.

## 2023-09-01 NOTE — Assessment & Plan Note (Signed)
Previous admission for NSTEMI.  ECHO with no wall motion abnormality.  EF 60%.  Recommended dual antiplatelet therapy and statin medication.  Per note review, off plavix for surgery. Will confirm with cardiology regarding remaining off plavix after procedure. Plans to resume aspirin therapy.  Continue f/u with cardiology.

## 2023-09-02 ENCOUNTER — Telehealth: Payer: Self-pay | Admitting: Internal Medicine

## 2023-09-02 MED ORDER — NEOMYCIN SULFATE 500 MG PO TABS
1000.0000 mg | ORAL_TABLET | ORAL | Status: DC
  Filled 2023-09-02: qty 2

## 2023-09-02 MED ORDER — METRONIDAZOLE 500 MG PO TABS
1000.0000 mg | ORAL_TABLET | ORAL | Status: DC
  Filled 2023-09-02: qty 2

## 2023-09-02 NOTE — Telephone Encounter (Signed)
FYI

## 2023-09-02 NOTE — Telephone Encounter (Signed)
Noted. Please notify pt and her son that after surgery - can remain off plavix.  Will need to resume aspirin daily after surgery.

## 2023-09-02 NOTE — Telephone Encounter (Signed)
Shanda Bumps from [facility] just called and said the Plavix after patient has her procedure can be stopped. Her number is [phone]. If you have any questions.

## 2023-09-03 ENCOUNTER — Ambulatory Visit
Admission: RE | Admit: 2023-09-03 | Discharge: 2023-09-03 | Disposition: A | Discharge status: Home / Self Care | Payer: Medicare Other | Attending: Gastroenterology | Admitting: Gastroenterology

## 2023-09-03 ENCOUNTER — Ambulatory Visit: Payer: Medicare Other | Admitting: Medical

## 2023-09-03 ENCOUNTER — Encounter: Payer: Self-pay | Admitting: *Deleted

## 2023-09-03 ENCOUNTER — Ambulatory Visit: Payer: Medicare Other | Admitting: Registered Nurse

## 2023-09-03 ENCOUNTER — Encounter
Admission: RE | Disposition: A | Discharge status: Home / Self Care | Payer: Self-pay | Source: Home / Self Care | Attending: Gastroenterology

## 2023-09-03 DIAGNOSIS — Z01818 Encounter for other preprocedural examination: Secondary | ICD-10-CM | POA: Insufficient documentation

## 2023-09-03 DIAGNOSIS — I252 Old myocardial infarction: Secondary | ICD-10-CM | POA: Insufficient documentation

## 2023-09-03 DIAGNOSIS — I2510 Atherosclerotic heart disease of native coronary artery without angina pectoris: Secondary | ICD-10-CM | POA: Insufficient documentation

## 2023-09-03 DIAGNOSIS — E890 Postprocedural hypothyroidism: Secondary | ICD-10-CM | POA: Diagnosis not present

## 2023-09-03 DIAGNOSIS — Z955 Presence of coronary angioplasty implant and graft: Secondary | ICD-10-CM | POA: Diagnosis not present

## 2023-09-03 DIAGNOSIS — Z923 Personal history of irradiation: Secondary | ICD-10-CM | POA: Diagnosis not present

## 2023-09-03 DIAGNOSIS — I272 Pulmonary hypertension, unspecified: Secondary | ICD-10-CM | POA: Diagnosis not present

## 2023-09-03 DIAGNOSIS — K641 Second degree hemorrhoids: Secondary | ICD-10-CM | POA: Insufficient documentation

## 2023-09-03 DIAGNOSIS — J45909 Unspecified asthma, uncomplicated: Secondary | ICD-10-CM | POA: Insufficient documentation

## 2023-09-03 DIAGNOSIS — I110 Hypertensive heart disease with heart failure: Secondary | ICD-10-CM | POA: Insufficient documentation

## 2023-09-03 DIAGNOSIS — Z853 Personal history of malignant neoplasm of breast: Secondary | ICD-10-CM | POA: Diagnosis not present

## 2023-09-03 DIAGNOSIS — Z85828 Personal history of other malignant neoplasm of skin: Secondary | ICD-10-CM | POA: Insufficient documentation

## 2023-09-03 DIAGNOSIS — Z9049 Acquired absence of other specified parts of digestive tract: Secondary | ICD-10-CM | POA: Diagnosis not present

## 2023-09-03 DIAGNOSIS — K623 Rectal prolapse: Secondary | ICD-10-CM | POA: Insufficient documentation

## 2023-09-03 DIAGNOSIS — E785 Hyperlipidemia, unspecified: Secondary | ICD-10-CM | POA: Insufficient documentation

## 2023-09-03 DIAGNOSIS — Z7902 Long term (current) use of antithrombotics/antiplatelets: Secondary | ICD-10-CM | POA: Insufficient documentation

## 2023-09-03 DIAGNOSIS — D125 Benign neoplasm of sigmoid colon: Secondary | ICD-10-CM | POA: Diagnosis not present

## 2023-09-03 DIAGNOSIS — I509 Heart failure, unspecified: Secondary | ICD-10-CM | POA: Diagnosis not present

## 2023-09-03 DIAGNOSIS — K5730 Diverticulosis of large intestine without perforation or abscess without bleeding: Secondary | ICD-10-CM | POA: Insufficient documentation

## 2023-09-03 HISTORY — DX: Heart failure, unspecified: I50.9

## 2023-09-03 HISTORY — DX: Long term (current) use of anticoagulants: Z79.01

## 2023-09-03 HISTORY — DX: Personal history of other infectious and parasitic diseases: Z86.19

## 2023-09-03 HISTORY — DX: Acute myocardial infarction, unspecified: I21.9

## 2023-09-03 HISTORY — DX: Hyperlipidemia, unspecified: E78.5

## 2023-09-03 HISTORY — DX: Atherosclerosis of aorta: I70.0

## 2023-09-03 HISTORY — DX: Unspecified asthma, uncomplicated: J45.909

## 2023-09-03 HISTORY — DX: Pulmonary hypertension, unspecified: I27.20

## 2023-09-03 HISTORY — DX: Cardiomegaly: I51.7

## 2023-09-03 HISTORY — PX: COLONOSCOPY WITH PROPOFOL: SHX5780

## 2023-09-03 HISTORY — DX: Other specified disorders of kidney and ureter: N28.89

## 2023-09-03 HISTORY — DX: Stress incontinence (female) (male): N39.3

## 2023-09-03 HISTORY — DX: Atherosclerotic heart disease of native coronary artery without angina pectoris: I25.10

## 2023-09-03 HISTORY — DX: Acquired absence of unspecified breast and nipple: Z90.10

## 2023-09-03 HISTORY — DX: Other forms of dyspnea: R06.09

## 2023-09-03 HISTORY — DX: Bronchiectasis, uncomplicated: J47.9

## 2023-09-03 HISTORY — DX: Acquired absence of other organs: Z90.89

## 2023-09-03 HISTORY — DX: Rectal prolapse: K62.3

## 2023-09-03 HISTORY — DX: Urge incontinence: N39.41

## 2023-09-03 HISTORY — DX: Hypomagnesemia: E83.42

## 2023-09-03 HISTORY — DX: Postprocedural hypothyroidism: E89.0

## 2023-09-03 SURGERY — COLONOSCOPY WITH PROPOFOL
Anesthesia: General

## 2023-09-03 MED ORDER — SODIUM CHLORIDE 0.9 % IV SOLN: INTRAVENOUS | Status: DC

## 2023-09-03 MED ORDER — LIDOCAINE HCL (CARDIAC) PF 100 MG/5ML IV SOSY
PREFILLED_SYRINGE | INTRAVENOUS | Status: DC | PRN
  Administered 2023-09-03: 40 mg via INTRAVENOUS

## 2023-09-03 MED ORDER — PROPOFOL 10 MG/ML IV BOLUS
INTRAVENOUS | Status: AC
  Filled 2023-09-03: qty 40

## 2023-09-03 MED ORDER — PROPOFOL 500 MG/50ML IV EMUL
INTRAVENOUS | Status: DC | PRN
  Administered 2023-09-03: 50 ug/kg/min via INTRAVENOUS

## 2023-09-03 MED ORDER — PROPOFOL 10 MG/ML IV BOLUS
INTRAVENOUS | Status: DC | PRN
  Administered 2023-09-03: 40 mg via INTRAVENOUS

## 2023-09-03 MED ORDER — LIDOCAINE HCL (PF) 2 % IJ SOLN
INTRAMUSCULAR | Status: AC
  Filled 2023-09-03: qty 5

## 2023-09-03 NOTE — Transfer of Care (Signed)
Immediate Anesthesia Transfer of Care Note  Patient: Alexa Pham  Procedure(s) Performed: COLONOSCOPY WITH PROPOFOL  Patient Location: PACU  Anesthesia Type:General  Level of Consciousness: drowsy and patient cooperative  Airway & Oxygen Therapy: Patient Spontanous Breathing  Post-op Assessment: Report given to RN and Post -op Vital signs reviewed and stable  Post vital signs: stable  Last Vitals:  Vitals Value Taken Time  BP 139/69 09/03/23 1435  Temp 36.3 C 09/03/23 1434  Pulse 65 09/03/23 1436  Resp    SpO2 95 % 09/03/23 1436  Vitals shown include unfiled device data.  Last Pain:  Vitals:   09/03/23 1434  TempSrc: Temporal  PainSc: Asleep         Complications: No notable events documented.

## 2023-09-03 NOTE — Op Note (Signed)
[facility] Gastroenterology Patient Name: Alexa Pham Procedure Date: 09/03/2023 2:03 PM MRN: [id number] Account #: [id number] Date of Birth: 28-Feb-1930 Admit Type: Outpatient Age: 89 Room: [facility] ENDO ROOM 2 Gender: Female Note Status: Finalized Instrument Name: Peds Colonoscope [id number] Procedure:             Colonoscopy Indications:           Preoperative assessment Providers:             Eather Colas MD, MD Medicines:             Monitored Anesthesia Care Complications:         No immediate complications. Estimated blood loss:                         Minimal. Procedure:             Pre-Anesthesia Assessment:                        - Prior to the procedure, a History and Physical was                         performed, and patient medications and allergies were                         reviewed. The patient is competent. The risks and                         benefits of the procedure and the sedation options and                         risks were discussed with the patient. All questions                         were answered and informed consent was obtained.                         Patient identification and proposed procedure were                         verified by the physician, the nurse, the                         anesthesiologist, the anesthetist and the technician                         in the endoscopy suite. Mental Status Examination:                         alert and oriented. Airway Examination: normal                         oropharyngeal airway and neck mobility. Respiratory                         Examination: clear to auscultation. CV Examination:                         normal. Prophylactic Antibiotics: The patient does not  require prophylactic antibiotics. Prior                         Anticoagulants: The patient has taken Plavix                         (clopidogrel), last dose was 5 days prior to                          procedure. ASA Grade Assessment: III - A patient with                         severe systemic disease. After reviewing the risks and                         benefits, the patient was deemed in satisfactory                         condition to undergo the procedure. The anesthesia                         plan was to use monitored anesthesia care (MAC).                         Immediately prior to administration of medications,                         the patient was re-assessed for adequacy to receive                         sedatives. The heart rate, respiratory rate, oxygen                         saturations, blood pressure, adequacy of pulmonary                         ventilation, and response to care were monitored                         throughout the procedure. The physical status of the                         patient was re-assessed after the procedure.                        After obtaining informed consent, the colonoscope was                         passed under direct vision. Throughout the procedure,                         the patient's blood pressure, pulse, and oxygen                         saturations were monitored continuously. The                         Colonoscope was introduced through the anus and  advanced to the the ileocecal valve. The colonoscopy                         was somewhat difficult due to a redundant colon. The                         patient tolerated the procedure well. The quality of                         the bowel preparation was good except the ascending                         colon was poor. The ileocecal valve was photographed. Findings:      The perianal and digital rectal examinations were normal.      A moderate amount of semi-solid stool was found in the ascending colon       and in the cecum, making visualization difficult. No large masses were       seen.      Two sessile polyps were found in the sigmoid  colon. The polyps were 5 to       8 mm in size. These polyps were removed with a cold snare. Resection and       retrieval were complete. Estimated blood loss was minimal.      Multiple small-mouthed diverticula were found in the sigmoid colon.      Moderate rectal prolapse was present.      Internal hemorrhoids were found during retroflexion. The hemorrhoids       were Grade II (internal hemorrhoids that prolapse but reduce       spontaneously).      The exam was otherwise without abnormality on direct and retroflexion       views. Impression:            - Stool in the ascending colon and in the cecum.                        - Two 5 to 8 mm polyps in the sigmoid colon, removed                         with a cold snare. Resected and retrieved.                        - Diverticulosis in the sigmoid colon.                        - Rectal prolapse.                        - Internal hemorrhoids.                        - The examination was otherwise normal on direct and                         retroflexion views. Recommendation:        - Discharge patient to home.                        - Clear liquid diet.                        -  Continue present medications.                        - Await pathology results.                        - Prep in ascending/cecum was poor but no large masses                         were seen.                        - Resume Plavix (clopidogrel) at prior dose once                         cleared to resume by surgery team. Procedure Code(s):     --- Professional ---                        [phone], Colonoscopy, flexible; with removal of                         tumor(s), polyp(s), or other lesion(s) by snare                         technique Diagnosis Code(s):     --- Professional ---                        K64.1, Second degree hemorrhoids                        D12.5, Benign neoplasm of sigmoid colon                        K62.3, Rectal prolapse                         Z01.818, Encounter for other preprocedural examination                        K57.30, Diverticulosis of large intestine without                         perforation or abscess without bleeding CPT copyright 2022 [employer] Association. All rights reserved. The codes documented in this report are preliminary and upon coder review may  be revised to meet current compliance requirements. Eather Colas MD, MD 09/03/2023 2:58:10 PM Number of Addenda: 0 Note Initiated On: 09/03/2023 2:03 PM Scope Withdrawal Time: 0 hours 7 minutes 19 seconds  Total Procedure Duration: 0 hours 18 minutes 4 seconds  Estimated Blood Loss:  Estimated blood loss was minimal.      [facility]

## 2023-09-03 NOTE — Interval H&P Note (Signed)
History and Physical Interval Note:  09/03/2023 1:38 PM  Alexa Pham  has presented today for surgery, with the diagnosis of Rectal Mucosa Prolapse.  The various methods of treatment have been discussed with the patient and family. After consideration of risks, benefits and other options for treatment, the patient has consented to  Procedure(s): COLONOSCOPY WITH PROPOFOL (N/A) as a surgical intervention.  The patient's history has been reviewed, patient examined, no change in status, stable for surgery.  I have reviewed the patient's chart and labs.  Questions were answered to the patient's satisfaction.     Regis Bill  Ok to proceed with colonoscopy

## 2023-09-03 NOTE — Telephone Encounter (Signed)
Patient and son aware

## 2023-09-03 NOTE — Anesthesia Postprocedure Evaluation (Signed)
Anesthesia Post Note  Patient: Alexa Pham  Procedure(s) Performed: COLONOSCOPY WITH PROPOFOL  Patient location during evaluation: PACU Anesthesia Type: General Level of consciousness: awake and alert Pain management: pain level controlled Vital Signs Assessment: post-procedure vital signs reviewed and stable Respiratory status: spontaneous breathing, nonlabored ventilation and respiratory function stable Cardiovascular status: blood pressure returned to baseline and stable Postop Assessment: no apparent nausea or vomiting Anesthetic complications: no   No notable events documented.   Last Vitals:  Vitals:   09/03/23 1456 09/03/23 1501  BP: (!) 198/87 (!) 190/83  Pulse: 71 79  Resp:    Temp:    SpO2: 96% 96%    Last Pain:  Vitals:   09/03/23 1456  TempSrc:   PainSc: 0-No pain                 Foye Deer

## 2023-09-03 NOTE — Anesthesia Preprocedure Evaluation (Signed)
Anesthesia Evaluation  Patient identified by MRN, date of birth, ID band Patient awake    Reviewed: Allergy & Precautions, H&P , NPO status , Patient's Chart, lab work & pertinent test results, reviewed documented beta blocker date and time   History of Anesthesia Complications (+) Family history of anesthesia reactionNegative for: history of anesthetic complications  Airway Mallampati: I  TM Distance: >3 FB Neck ROM: full    Dental  (+) Dental Advidsory Given, Caps, Teeth Intact   Pulmonary shortness of breath and with exertion, asthma , Continuous Positive Airway Pressure Ventilation , pneumonia, neg COPD, neg recent URI, former smoker   Pulmonary exam normal breath sounds clear to auscultation       Cardiovascular Exercise Tolerance: Good hypertension, pulmonary hypertension+ angina  + CAD, + Past MI, + Cardiac Stents and +CHF  (-) CABG Normal cardiovascular exam(-) dysrhythmias (-) Valvular Problems/Murmurs Rhythm:regular Rate:Normal  Echo 10/16/2022:   NORMAL LEFT VENTRICULAR SYSTOLIC FUNCTION    NORMAL RIGHT VENTRICULAR SYSTOLIC FUNCTION    VALVULAR REGURGITATION: MILD AR, TRIVIAL MR, MODERATE PR, MILD TR    NO VALVULAR STENOSIS    ESTIMATED RVSP=     Neuro/Psych negative neurological ROS  negative psych ROS   GI/Hepatic negative GI ROS, Neg liver ROS,,,  Endo/Other  neg diabetesHypothyroidism    Renal/GU negative Renal ROS  negative genitourinary   Musculoskeletal  (+) Arthritis ,    Abdominal   Peds  Hematology negative hematology ROS (+)   Anesthesia Other Findings   Reproductive/Obstetrics negative OB ROS                             Anesthesia Physical Anesthesia Plan  ASA: 4  Anesthesia Plan: General   Post-op Pain Management: Minimal or no pain anticipated, Ofirmev IV (intra-op)* and Precedex   Induction: Intravenous  PONV Risk Score and Plan: 3 and  Ondansetron, Dexamethasone and Treatment may vary due to age or medical condition  Airway Management Planned: Oral ETT  Additional Equipment: None and ClearSight  Intra-op Plan:   Post-operative Plan: Extubation in OR and Possible Post-op intubation/ventilation  Informed Consent: I have reviewed the patients History and Physical, chart, labs and discussed the procedure including the risks, benefits and alternatives for the proposed anesthesia with the patient or authorized representative who has indicated his/her understanding and acceptance.     Dental Advisory Given  Plan Discussed with: Anesthesiologist, CRNA and Surgeon  Anesthesia Plan Comments: (2 LB IV's  DISCUSSION: Alexa Pham is a 89 yo female who presents to PAT prior to ROBOTIC RECTOPEXY, RESECTION OF RECTOSIGMOID COLON and RIGID PROCTOSCOPY on 09/04/23 with Dr. Michaell Cowing. PMH of former smoking, hx of MI, CAD, aortic atherosclerosis, CHF, asthma, bronchiectasis, pulmonary HTN, DOE, hx of breast cancer s/p lumpectomy and XRT, s/p thyroidectomy, post procedure hypothyroidism, arthritis. Of note patient is also scheduled for colonoscopy at [facility] on 11/26.   Patient follows with Cardiology for hx of CAD s/p PCI in 2021, CHF, HTN. Last seen in clinic on 02/20/23. Denied any anginal symptoms but did have increase in LE edema and was continued on Lasix. Cardiac clearance received that no further testing is needed and patient can hold her aspirin for 5 days (paper copy in chart). Of note patient was told she could stop Plavix back in April but is still taking it. She was told she can hold her Plavix for 5 days, presumably by her surgeon. Message sent to her PCP regarding  if patient should remain on Plavix long term or not.   She follows with Pulmonology for hx of asthma/bronchiectasis, pulmonary HTN, and DOE. Stable on inhalers. Wears 2L at night for nocturnal hypoxemia. Last seen on 07/01/23 for pre-op clearance at [facility]. Also seen by [facility]  pulmonology on 07/17/23. Cleared for surgery:   "Preop pulmonary/respiratory exam Evaluation for repair of rectal prolapse Risk Assessment Tool Points % Risk of Postop Respiratory Failure  ARISCAT 16 or 32 (depending on duration <2 hrs or 2-3) 1.6-13% depending on procedure duration  Gupta -2.71 6.2 %  [place] 13 1.8 %   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 She has not had an asthma flare this year, but has had active treatment for fluid overload in the setting of CHF. She is not on oxygen during the day and in April 2024 had a 6-minute walk where she was able to walk 70% of predicted distance for her age, without desaturation, showing mild functional impairment.   While she is an elevated risk of perioperative pulmonary complications, I do not believe these are prohibitive when considering the quality of life impact of persistent rectal prolapse.  I think she needs a cardiac evaluation prior to consideration of elective general anesthesia, but does not have significant contraindications from pulmonary standpoint"   Patient follows with Vascular for lymphedema. Last seen on 07/10/23. Using Unna boots and advised to elevate legs.   )        Anesthesia Quick Evaluation

## 2023-09-03 NOTE — H&P (Signed)
Outpatient short stay form Pre-procedure 09/03/2023  Regis Bill, MD  Primary Physician: Dale Vina, MD  Reason for visit:  Screening  History of present illness:    89 y/o lady with history of hypothyroidism, hypertension, and rectal prolapse here for colonoscopy prior to her scheduled rectal prolapse surgery tomorrow. Takes plavix with last dose 5 days ago. History of appendectomy, hysterectomy, and cholecystectomy. No family history of GI malignancies.    Current Facility-Administered Medications:    0.9 %  sodium chloride infusion, , Intravenous, Continuous, Justun Anaya, Rossie Muskrat, MD, Last Rate: 20 mL/hr at 09/03/23 1247, New Bag at 09/03/23 1247   0.9 %  sodium chloride infusion, , Intravenous, Continuous, Kida Digiulio, Rossie Muskrat, MD   neomycin (MYCIFRADIN) tablet 1,000 mg, 1,000 mg, Oral, 3 times per day **AND** metroNIDAZOLE (FLAGYL) tablet 1,000 mg, 1,000 mg, Oral, 3 times per day, Karie Soda, MD  Medications Prior to Admission  Medication Sig Dispense Refill Last Dose   levothyroxine (SYNTHROID) 150 MCG tablet Take 1 tablet (150 mcg total) by mouth daily. 90 tablet 0 09/03/2023   albuterol (PROVENTIL) (2.5 MG/3ML) 0.083% nebulizer solution Take 2.5 mg by nebulization 2 (two) times daily.      amLODipine (NORVASC) 2.5 MG tablet Take 2.5 mg by mouth 2 (two) times daily.      aspirin EC 81 MG EC tablet Take 1 tablet (81 mg total) by mouth daily. Swallow whole. 30 tablet 0    atorvastatin (LIPITOR) 40 MG tablet TAKE 1 TABLET(40 MG) BY MOUTH DAILY 90 tablet 1    calcium citrate-vitamin D (CITRACAL+D) 315-200 MG-UNIT tablet Take 1 tablet by mouth 2 (two) times daily.      carvedilol (COREG) 6.25 MG tablet Take 6.25 mg by mouth 2 (two) times daily with a meal.      clopidogrel (PLAVIX) 75 MG tablet Take 1 tablet (75 mg total) by mouth daily. 90 tablet 1 08/29/2023   Fluticasone-Umeclidin-Vilant (TRELEGY ELLIPTA) 100-62.5-25 MCG/ACT AEPB Inhale 1 puff into the lungs daily. 60  each 5    furosemide (LASIX) 20 MG tablet Take by mouth.      irbesartan (AVAPRO) 150 MG tablet Take 1 tablet (150 mg total) by mouth daily. (Patient taking differently: Take 150 mg by mouth in the morning and at bedtime.) 30 tablet 11    Multiple Vitamins-Minerals (PRESERVISION AREDS 2 PO) Take 1 tablet by mouth 2 (two) times daily.       potassium chloride (KLOR-CON) 10 MEQ tablet TAKE 1 TABLET(10 MEQ) BY MOUTH DAILY (Patient taking differently: Take 10 mEq by mouth daily.) 90 tablet 0    sodium chloride HYPERTONIC 3 % nebulizer solution Take 4 mLs by nebulization in the morning and at bedtime.        Allergies  Allergen Reactions   Ticagrelor Shortness Of Breath    Extreme    Oxycodone Nausea Only     Past Medical History:  Diagnosis Date   Actinic keratosis 01/28/2020   R thumb proximal nail fold    Arthritis    Asthma    Atherosclerosis of abdominal aorta (HCC)    Atypical mole 05/13/2018   R distal lat thigh   Atypical mole 09/14/2014   left ant mid thigh   Atypical mole 01/19/2014   left paraspinal mid to upper back   Atypical mole 01/19/2014   right back mid to inf scapula   Atypical mole 01/19/2014   left posterior deltoid medial   Basal cell carcinoma 01/28/2020   left of midline  mid dorsum nose   Basal cell carcinoma 01/06/2018   left preauricular    Basal cell carcinoma 11/22/2016   left upper lip   Basal cell carcinoma 03/17/2015   left lateral elbow   Basal cell carcinoma 07/22/2014   R medial inferior knee   Basal cell carcinoma 07/21/2020   Left dorsum hand. Nodular pattern. Excised 08/09/2020, margins free.   Basal cell carcinoma 02/23/2021   right dorsal hand (BCC/SCC), left dorsal hand, right chin - treated with EDC   Breast cancer (HCC)    Left breast lumpectomy with radiation   Bronchiectasis (HCC)    CHF (congestive heart failure) (HCC)    Chronic anticoagulation    Coronary artery disease    Dyspnea on exertion    Family history of adverse  reaction to anesthesia    son naseau   Family history of breast cancer    H/O partial mastectomy    LEFT   History of breast cancer    History of pneumonia    History of shingles    History of thyroidectomy, total    Hyperlipidemia    Hypertension    Hypomagnesemia    Hypothyroidism    Macular degeneration    Myocardial infarction (HCC)    Pneumonia    Pulmonary hypertension (HCC)    Rectal prolapse    Renal mass, left    Right ventricular enlargement    Squamous cell carcinoma of skin 01/06/2018   right dorsum hand at ring finger MCP   Squamous cell carcinoma of skin 07/08/2017   left distal dorsum forearm    SUI (stress urinary incontinence, female)    Urge incontinence     Review of systems:  Otherwise negative.    Physical Exam  Gen: Alert, oriented. Appears stated age.  HEENT: PERRLA. Lungs: No respiratory distress CV: RRR Abd: soft, benign, no masses Ext: No edema    Planned procedures: Proceed with colonoscopy. The patient understands the nature of the planned procedure, indications, risks, alternatives and potential complications including but not limited to bleeding, infection, perforation, damage to internal organs and possible oversedation/side effects from anesthesia. The patient agrees and gives consent to proceed.  Please refer to procedure notes for findings, recommendations and patient disposition/instructions.     Regis Bill, MD [facility]

## 2023-09-04 ENCOUNTER — Other Ambulatory Visit: Payer: Self-pay

## 2023-09-04 ENCOUNTER — Inpatient Hospital Stay (HOSPITAL_COMMUNITY): Payer: Medicare Other | Admitting: Anesthesiology

## 2023-09-04 ENCOUNTER — Encounter (HOSPITAL_COMMUNITY)
Admission: RE | Disposition: A | Discharge status: Skilled Nursing Facility | Payer: Self-pay | Source: Home / Self Care | Attending: Surgery

## 2023-09-04 ENCOUNTER — Encounter (HOSPITAL_COMMUNITY): Payer: Self-pay | Admitting: Surgery

## 2023-09-04 ENCOUNTER — Inpatient Hospital Stay (HOSPITAL_COMMUNITY)
Admission: RE | Admit: 2023-09-04 | Discharge: 2023-09-17 | DRG: 329 | Disposition: A | Discharge status: Skilled Nursing Facility | Payer: Medicare Other | Attending: Surgery | Admitting: Surgery

## 2023-09-04 DIAGNOSIS — K562 Volvulus: Secondary | ICD-10-CM | POA: Diagnosis present

## 2023-09-04 DIAGNOSIS — E43 Unspecified severe protein-calorie malnutrition: Secondary | ICD-10-CM | POA: Diagnosis present

## 2023-09-04 DIAGNOSIS — E1122 Type 2 diabetes mellitus with diabetic chronic kidney disease: Secondary | ICD-10-CM | POA: Diagnosis present

## 2023-09-04 DIAGNOSIS — I130 Hypertensive heart and chronic kidney disease with heart failure and stage 1 through stage 4 chronic kidney disease, or unspecified chronic kidney disease: Secondary | ICD-10-CM | POA: Diagnosis not present

## 2023-09-04 DIAGNOSIS — Z955 Presence of coronary angioplasty implant and graft: Secondary | ICD-10-CM

## 2023-09-04 DIAGNOSIS — Z8249 Family history of ischemic heart disease and other diseases of the circulatory system: Secondary | ICD-10-CM

## 2023-09-04 DIAGNOSIS — K623 Rectal prolapse: Secondary | ICD-10-CM | POA: Diagnosis present

## 2023-09-04 DIAGNOSIS — B370 Candidal stomatitis: Secondary | ICD-10-CM | POA: Diagnosis present

## 2023-09-04 DIAGNOSIS — K561 Intussusception: Secondary | ICD-10-CM | POA: Diagnosis present

## 2023-09-04 DIAGNOSIS — G4734 Idiopathic sleep related nonobstructive alveolar hypoventilation: Secondary | ICD-10-CM | POA: Diagnosis present

## 2023-09-04 DIAGNOSIS — D62 Acute posthemorrhagic anemia: Secondary | ICD-10-CM | POA: Diagnosis not present

## 2023-09-04 DIAGNOSIS — E785 Hyperlipidemia, unspecified: Secondary | ICD-10-CM | POA: Diagnosis present

## 2023-09-04 DIAGNOSIS — Z7982 Long term (current) use of aspirin: Secondary | ICD-10-CM

## 2023-09-04 DIAGNOSIS — N179 Acute kidney failure, unspecified: Secondary | ICD-10-CM | POA: Diagnosis present

## 2023-09-04 DIAGNOSIS — Z7901 Long term (current) use of anticoagulants: Secondary | ICD-10-CM

## 2023-09-04 DIAGNOSIS — R6339 Other feeding difficulties: Secondary | ICD-10-CM | POA: Diagnosis present

## 2023-09-04 DIAGNOSIS — I252 Old myocardial infarction: Secondary | ICD-10-CM

## 2023-09-04 DIAGNOSIS — I10 Essential (primary) hypertension: Secondary | ICD-10-CM | POA: Diagnosis present

## 2023-09-04 DIAGNOSIS — Z9842 Cataract extraction status, left eye: Secondary | ICD-10-CM

## 2023-09-04 DIAGNOSIS — I272 Pulmonary hypertension, unspecified: Secondary | ICD-10-CM | POA: Diagnosis present

## 2023-09-04 DIAGNOSIS — E7800 Pure hypercholesterolemia, unspecified: Secondary | ICD-10-CM | POA: Diagnosis present

## 2023-09-04 DIAGNOSIS — N3946 Mixed incontinence: Secondary | ICD-10-CM | POA: Diagnosis present

## 2023-09-04 DIAGNOSIS — D539 Nutritional anemia, unspecified: Secondary | ICD-10-CM | POA: Diagnosis present

## 2023-09-04 DIAGNOSIS — J45909 Unspecified asthma, uncomplicated: Secondary | ICD-10-CM | POA: Diagnosis present

## 2023-09-04 DIAGNOSIS — F05 Delirium due to known physiological condition: Secondary | ICD-10-CM | POA: Diagnosis not present

## 2023-09-04 DIAGNOSIS — E039 Hypothyroidism, unspecified: Secondary | ICD-10-CM | POA: Diagnosis present

## 2023-09-04 DIAGNOSIS — Z79890 Hormone replacement therapy (postmenopausal): Secondary | ICD-10-CM

## 2023-09-04 DIAGNOSIS — J9601 Acute respiratory failure with hypoxia: Secondary | ICD-10-CM | POA: Diagnosis not present

## 2023-09-04 DIAGNOSIS — Z96652 Presence of left artificial knee joint: Secondary | ICD-10-CM | POA: Diagnosis present

## 2023-09-04 DIAGNOSIS — E782 Mixed hyperlipidemia: Secondary | ICD-10-CM | POA: Diagnosis present

## 2023-09-04 DIAGNOSIS — R159 Full incontinence of feces: Secondary | ICD-10-CM | POA: Diagnosis present

## 2023-09-04 DIAGNOSIS — J479 Bronchiectasis, uncomplicated: Secondary | ICD-10-CM

## 2023-09-04 DIAGNOSIS — I5033 Acute on chronic diastolic (congestive) heart failure: Secondary | ICD-10-CM | POA: Diagnosis not present

## 2023-09-04 DIAGNOSIS — H9190 Unspecified hearing loss, unspecified ear: Secondary | ICD-10-CM | POA: Diagnosis present

## 2023-09-04 DIAGNOSIS — R110 Nausea: Secondary | ICD-10-CM | POA: Diagnosis not present

## 2023-09-04 DIAGNOSIS — R410 Disorientation, unspecified: Secondary | ICD-10-CM | POA: Diagnosis not present

## 2023-09-04 DIAGNOSIS — E860 Dehydration: Secondary | ICD-10-CM | POA: Diagnosis present

## 2023-09-04 DIAGNOSIS — I2510 Atherosclerotic heart disease of native coronary artery without angina pectoris: Secondary | ICD-10-CM | POA: Diagnosis present

## 2023-09-04 DIAGNOSIS — Z853 Personal history of malignant neoplasm of breast: Secondary | ICD-10-CM

## 2023-09-04 DIAGNOSIS — E440 Moderate protein-calorie malnutrition: Secondary | ICD-10-CM | POA: Diagnosis present

## 2023-09-04 DIAGNOSIS — R1311 Dysphagia, oral phase: Secondary | ICD-10-CM | POA: Diagnosis present

## 2023-09-04 DIAGNOSIS — Z9012 Acquired absence of left breast and nipple: Secondary | ICD-10-CM

## 2023-09-04 DIAGNOSIS — Z681 Body mass index (BMI) 19 or less, adult: Secondary | ICD-10-CM

## 2023-09-04 DIAGNOSIS — N3941 Urge incontinence: Secondary | ICD-10-CM | POA: Diagnosis present

## 2023-09-04 DIAGNOSIS — I700 Atherosclerosis of aorta: Secondary | ICD-10-CM | POA: Diagnosis present

## 2023-09-04 DIAGNOSIS — I509 Heart failure, unspecified: Secondary | ICD-10-CM

## 2023-09-04 DIAGNOSIS — Y738 Miscellaneous gastroenterology and urology devices associated with adverse incidents, not elsewhere classified: Secondary | ICD-10-CM | POA: Diagnosis not present

## 2023-09-04 DIAGNOSIS — N393 Stress incontinence (female) (male): Secondary | ICD-10-CM | POA: Diagnosis present

## 2023-09-04 DIAGNOSIS — Z7902 Long term (current) use of antithrombotics/antiplatelets: Secondary | ICD-10-CM

## 2023-09-04 DIAGNOSIS — Q438 Other specified congenital malformations of intestine: Secondary | ICD-10-CM

## 2023-09-04 DIAGNOSIS — Z90711 Acquired absence of uterus with remaining cervical stump: Secondary | ICD-10-CM

## 2023-09-04 DIAGNOSIS — Z90710 Acquired absence of both cervix and uterus: Secondary | ICD-10-CM

## 2023-09-04 DIAGNOSIS — K660 Peritoneal adhesions (postprocedural) (postinfection): Secondary | ICD-10-CM | POA: Diagnosis present

## 2023-09-04 DIAGNOSIS — Z85828 Personal history of other malignant neoplasm of skin: Secondary | ICD-10-CM

## 2023-09-04 DIAGNOSIS — Z79899 Other long term (current) drug therapy: Secondary | ICD-10-CM

## 2023-09-04 DIAGNOSIS — T83031A Leakage of indwelling urethral catheter, initial encounter: Secondary | ICD-10-CM | POA: Diagnosis not present

## 2023-09-04 DIAGNOSIS — Z9049 Acquired absence of other specified parts of digestive tract: Secondary | ICD-10-CM

## 2023-09-04 DIAGNOSIS — I517 Cardiomegaly: Secondary | ICD-10-CM | POA: Diagnosis present

## 2023-09-04 DIAGNOSIS — Z87891 Personal history of nicotine dependence: Secondary | ICD-10-CM

## 2023-09-04 DIAGNOSIS — N2889 Other specified disorders of kidney and ureter: Secondary | ICD-10-CM | POA: Diagnosis present

## 2023-09-04 DIAGNOSIS — Z9841 Cataract extraction status, right eye: Secondary | ICD-10-CM

## 2023-09-04 DIAGNOSIS — I959 Hypotension, unspecified: Secondary | ICD-10-CM | POA: Diagnosis present

## 2023-09-04 DIAGNOSIS — Z8261 Family history of arthritis: Secondary | ICD-10-CM

## 2023-09-04 DIAGNOSIS — Z806 Family history of leukemia: Secondary | ICD-10-CM

## 2023-09-04 DIAGNOSIS — Z803 Family history of malignant neoplasm of breast: Secondary | ICD-10-CM

## 2023-09-04 DIAGNOSIS — Z8619 Personal history of other infectious and parasitic diseases: Secondary | ICD-10-CM

## 2023-09-04 DIAGNOSIS — N182 Chronic kidney disease, stage 2 (mild): Secondary | ICD-10-CM | POA: Diagnosis present

## 2023-09-04 HISTORY — PX: FLEXIBLE SIGMOIDOSCOPY: SHX5431

## 2023-09-04 HISTORY — DX: Presence of coronary angioplasty implant and graft: Z95.5

## 2023-09-04 LAB — ABO/RH: ABO/RH(D): O POS

## 2023-09-04 LAB — SURGICAL PATHOLOGY

## 2023-09-04 SURGERY — COLECTOMY, SIGMOID, ROBOT-ASSISTED
Anesthesia: General

## 2023-09-04 MED ORDER — PROCHLORPERAZINE EDISYLATE 10 MG/2ML IJ SOLN: 5.0000 mg | Freq: Four times a day (QID) | INTRAMUSCULAR | Status: DC | PRN

## 2023-09-04 MED ORDER — ASPIRIN 81 MG PO TBEC
81.0000 mg | DELAYED_RELEASE_TABLET | Freq: Every day | ORAL | Status: DC
  Administered 2023-09-05 – 2023-09-17 (×13): 81 mg via ORAL
  Filled 2023-09-04 (×13): qty 1

## 2023-09-04 MED ORDER — ATORVASTATIN CALCIUM 40 MG PO TABS
40.0000 mg | ORAL_TABLET | Freq: Every day | ORAL | Status: DC
  Administered 2023-09-05 – 2023-09-08 (×4): 40 mg via ORAL
  Filled 2023-09-04 (×2): qty 1
  Filled 2023-09-04: qty 2
  Filled 2023-09-04: qty 1

## 2023-09-04 MED ORDER — SODIUM CHLORIDE 0.9 % IV SOLN
2.0000 g | Freq: Two times a day (BID) | INTRAVENOUS | Status: AC
  Administered 2023-09-04: 2 g via INTRAVENOUS
  Filled 2023-09-04: qty 2

## 2023-09-04 MED ORDER — ROCURONIUM BROMIDE 10 MG/ML (PF) SYRINGE
PREFILLED_SYRINGE | INTRAVENOUS | Status: AC
  Filled 2023-09-04: qty 10

## 2023-09-04 MED ORDER — SODIUM CHLORIDE 0.9% FLUSH: 3.0000 mL | INTRAVENOUS | Status: DC | PRN

## 2023-09-04 MED ORDER — OXYCODONE HCL 5 MG PO TABS: 5.0000 mg | ORAL_TABLET | Freq: Once | ORAL | Status: DC | PRN

## 2023-09-04 MED ORDER — FENTANYL CITRATE PF 50 MCG/ML IJ SOSY
PREFILLED_SYRINGE | INTRAMUSCULAR | Status: AC
  Administered 2023-09-04: 25 ug via INTRAVENOUS
  Filled 2023-09-04: qty 1

## 2023-09-04 MED ORDER — LIDOCAINE HCL (PF) 2 % IJ SOLN
INTRAMUSCULAR | Status: AC
  Filled 2023-09-04: qty 5

## 2023-09-04 MED ORDER — BUPIVACAINE LIPOSOME 1.3 % IJ SUSP
INTRAMUSCULAR | Status: AC
  Filled 2023-09-04: qty 20

## 2023-09-04 MED ORDER — CARVEDILOL 6.25 MG PO TABS
6.2500 mg | ORAL_TABLET | Freq: Two times a day (BID) | ORAL | Status: DC
  Administered 2023-09-05 – 2023-09-07 (×3): 6.25 mg via ORAL
  Filled 2023-09-04 (×4): qty 1

## 2023-09-04 MED ORDER — AMLODIPINE BESYLATE 2.5 MG PO TABS
2.5000 mg | ORAL_TABLET | Freq: Two times a day (BID) | ORAL | Status: DC
  Administered 2023-09-06: 2.5 mg via ORAL
  Filled 2023-09-04 (×4): qty 1

## 2023-09-04 MED ORDER — LACTATED RINGERS IV SOLN: INTRAVENOUS | Status: AC

## 2023-09-04 MED ORDER — ACETAMINOPHEN 160 MG/5ML PO SOLN: 325.0000 mg | ORAL | Status: DC | PRN

## 2023-09-04 MED ORDER — METHOCARBAMOL 500 MG PO TABS: 1000.0000 mg | ORAL_TABLET | Freq: Four times a day (QID) | ORAL | Status: DC | PRN

## 2023-09-04 MED ORDER — ACETAMINOPHEN 500 MG PO TABS
1000.0000 mg | ORAL_TABLET | ORAL | Status: AC
  Administered 2023-09-04: 1000 mg via ORAL
  Filled 2023-09-04: qty 2

## 2023-09-04 MED ORDER — BISMUTH SUBSALICYLATE 262 MG/15ML PO SUSP: 30.0000 mL | Freq: Three times a day (TID) | ORAL | Status: DC | PRN

## 2023-09-04 MED ORDER — ENSURE PRE-SURGERY PO LIQD: 592.0000 mL | Freq: Once | ORAL | Status: DC

## 2023-09-04 MED ORDER — POLYETHYLENE GLYCOL 3350 17 GM/SCOOP PO POWD: 1.0000 | Freq: Once | ORAL | Status: DC

## 2023-09-04 MED ORDER — ONDANSETRON HCL 4 MG/2ML IJ SOLN: 4.0000 mg | Freq: Once | INTRAMUSCULAR | Status: AC | PRN

## 2023-09-04 MED ORDER — ALBUTEROL SULFATE (2.5 MG/3ML) 0.083% IN NEBU
2.5000 mg | INHALATION_SOLUTION | Freq: Two times a day (BID) | RESPIRATORY_TRACT | Status: DC
Start: 2023-09-04 — End: 2023-09-17
  Administered 2023-09-04 – 2023-09-17 (×26): 2.5 mg via RESPIRATORY_TRACT
  Filled 2023-09-04 (×26): qty 3

## 2023-09-04 MED ORDER — METHOCARBAMOL 1000 MG/10ML IJ SOLN: 1000.0000 mg | Freq: Four times a day (QID) | INTRAMUSCULAR | Status: DC | PRN

## 2023-09-04 MED ORDER — HYDROMORPHONE HCL 1 MG/ML IJ SOLN
0.5000 mg | INTRAMUSCULAR | Status: DC | PRN
  Administered 2023-09-07: 0.5 mg via INTRAVENOUS
  Filled 2023-09-04: qty 1

## 2023-09-04 MED ORDER — ENSURE PRE-SURGERY PO LIQD: 296.0000 mL | Freq: Once | ORAL | Status: DC

## 2023-09-04 MED ORDER — SODIUM CHLORIDE 0.9% FLUSH
3.0000 mL | Freq: Two times a day (BID) | INTRAVENOUS | Status: DC
  Administered 2023-09-04 – 2023-09-16 (×23): 3 mL via INTRAVENOUS

## 2023-09-04 MED ORDER — ALVIMOPAN 12 MG PO CAPS
12.0000 mg | ORAL_CAPSULE | Freq: Two times a day (BID) | ORAL | Status: DC
  Administered 2023-09-05 (×2): 12 mg via ORAL
  Filled 2023-09-04 (×2): qty 1

## 2023-09-04 MED ORDER — ORAL CARE MOUTH RINSE: 15.0000 mL | Freq: Once | OROMUCOSAL | Status: AC

## 2023-09-04 MED ORDER — ONDANSETRON HCL 4 MG/2ML IJ SOLN
4.0000 mg | Freq: Four times a day (QID) | INTRAMUSCULAR | Status: DC | PRN
  Administered 2023-09-07: 4 mg via INTRAVENOUS
  Filled 2023-09-04: qty 2

## 2023-09-04 MED ORDER — PROCHLORPERAZINE MALEATE 10 MG PO TABS: 10.0000 mg | ORAL_TABLET | Freq: Four times a day (QID) | ORAL | Status: DC | PRN

## 2023-09-04 MED ORDER — DEXAMETHASONE SODIUM PHOSPHATE 10 MG/ML IJ SOLN
INTRAMUSCULAR | Status: DC | PRN
  Administered 2023-09-04: 5 mg via INTRAVENOUS

## 2023-09-04 MED ORDER — SODIUM CHLORIDE 0.9 % IV SOLN: 250.0000 mL | INTRAVENOUS | Status: DC | PRN

## 2023-09-04 MED ORDER — ENOXAPARIN SODIUM 40 MG/0.4ML IJ SOSY
40.0000 mg | PREFILLED_SYRINGE | Freq: Once | INTRAMUSCULAR | Status: AC
  Administered 2023-09-04: 40 mg via SUBCUTANEOUS
  Filled 2023-09-04: qty 0.4

## 2023-09-04 MED ORDER — MEPERIDINE HCL 50 MG/ML IJ SOLN: 6.2500 mg | INTRAMUSCULAR | Status: DC | PRN

## 2023-09-04 MED ORDER — CHLORHEXIDINE GLUCONATE 0.12 % MT SOLN
15.0000 mL | Freq: Once | OROMUCOSAL | Status: AC
  Administered 2023-09-04: 15 mL via OROMUCOSAL

## 2023-09-04 MED ORDER — PROPOFOL 10 MG/ML IV BOLUS
INTRAVENOUS | Status: DC | PRN
  Administered 2023-09-04: 80 mg via INTRAVENOUS

## 2023-09-04 MED ORDER — ALBUMIN HUMAN 5 % IV SOLN
INTRAVENOUS | Status: AC
  Filled 2023-09-04: qty 250

## 2023-09-04 MED ORDER — PHENOL 1.4 % MT LIQD: 2.0000 | OROMUCOSAL | Status: DC | PRN

## 2023-09-04 MED ORDER — BUPIVACAINE LIPOSOME 1.3 % IJ SUSP: 20.0000 mL | Freq: Once | INTRAMUSCULAR | Status: DC

## 2023-09-04 MED ORDER — 0.9 % SODIUM CHLORIDE (POUR BTL) OPTIME
TOPICAL | Status: DC | PRN
  Administered 2023-09-04: 1000 mL

## 2023-09-04 MED ORDER — TRAMADOL HCL 50 MG PO TABS: 50.0000 mg | ORAL_TABLET | Freq: Four times a day (QID) | ORAL | 0 refills | Status: DC | PRN

## 2023-09-04 MED ORDER — ALVIMOPAN 12 MG PO CAPS
12.0000 mg | ORAL_CAPSULE | ORAL | Status: AC
  Administered 2023-09-04: 12 mg via ORAL
  Filled 2023-09-04: qty 1

## 2023-09-04 MED ORDER — FENTANYL CITRATE PF 50 MCG/ML IJ SOSY: 25.0000 ug | PREFILLED_SYRINGE | INTRAMUSCULAR | Status: DC | PRN

## 2023-09-04 MED ORDER — PROPOFOL 10 MG/ML IV BOLUS
INTRAVENOUS | Status: AC
  Filled 2023-09-04: qty 20

## 2023-09-04 MED ORDER — LIDOCAINE 2% (20 MG/ML) 5 ML SYRINGE
INTRAMUSCULAR | Status: DC | PRN
  Administered 2023-09-04: 100 mg via INTRAVENOUS

## 2023-09-04 MED ORDER — METOPROLOL TARTRATE 5 MG/5ML IV SOLN
5.0000 mg | Freq: Four times a day (QID) | INTRAVENOUS | Status: DC | PRN
  Administered 2023-09-17: 5 mg via INTRAVENOUS
  Filled 2023-09-04: qty 5

## 2023-09-04 MED ORDER — SODIUM CHLORIDE 0.9 % IV SOLN: INTRAVENOUS | Status: DC | PRN

## 2023-09-04 MED ORDER — TRAMADOL HCL 50 MG PO TABS
50.0000 mg | ORAL_TABLET | Freq: Four times a day (QID) | ORAL | Status: DC | PRN
  Administered 2023-09-08 (×2): 50 mg via ORAL
  Filled 2023-09-04 (×2): qty 1

## 2023-09-04 MED ORDER — PSYLLIUM 95 % PO PACK
1.0000 | PACK | Freq: Two times a day (BID) | ORAL | Status: DC
  Administered 2023-09-05 – 2023-09-08 (×7): 1 via ORAL
  Filled 2023-09-04 (×10): qty 1

## 2023-09-04 MED ORDER — ONDANSETRON HCL 4 MG PO TABS: 4.0000 mg | ORAL_TABLET | Freq: Four times a day (QID) | ORAL | Status: DC | PRN

## 2023-09-04 MED ORDER — ROCURONIUM BROMIDE 10 MG/ML (PF) SYRINGE
PREFILLED_SYRINGE | INTRAVENOUS | Status: DC | PRN
  Administered 2023-09-04: 70 mg via INTRAVENOUS
  Administered 2023-09-04 (×2): 10 mg via INTRAVENOUS

## 2023-09-04 MED ORDER — FENTANYL CITRATE (PF) 100 MCG/2ML IJ SOLN
INTRAMUSCULAR | Status: AC
  Filled 2023-09-04: qty 2

## 2023-09-04 MED ORDER — ACETAMINOPHEN 325 MG PO TABS: 325.0000 mg | ORAL_TABLET | ORAL | Status: DC | PRN

## 2023-09-04 MED ORDER — BISACODYL 5 MG PO TBEC: 20.0000 mg | DELAYED_RELEASE_TABLET | Freq: Once | ORAL | Status: DC

## 2023-09-04 MED ORDER — SODIUM CHLORIDE 0.9 % IV SOLN
2.0000 g | INTRAVENOUS | Status: AC
  Administered 2023-09-04: 2 g via INTRAVENOUS
  Filled 2023-09-04: qty 2

## 2023-09-04 MED ORDER — HYDRALAZINE HCL 20 MG/ML IJ SOLN
10.0000 mg | INTRAMUSCULAR | Status: DC | PRN
  Administered 2023-09-09 – 2023-09-16 (×2): 10 mg via INTRAVENOUS
  Filled 2023-09-04 (×2): qty 1

## 2023-09-04 MED ORDER — ALBUMIN HUMAN 5 % IV SOLN: INTRAVENOUS | Status: DC | PRN

## 2023-09-04 MED ORDER — BUPIVACAINE-EPINEPHRINE (PF) 0.25% -1:200000 IJ SOLN
INTRAMUSCULAR | Status: DC | PRN
  Administered 2023-09-04: 50 mL

## 2023-09-04 MED ORDER — NAPHAZOLINE-GLYCERIN 0.012-0.25 % OP SOLN: 1.0000 [drp] | Freq: Four times a day (QID) | OPHTHALMIC | Status: DC | PRN

## 2023-09-04 MED ORDER — KCL IN DEXTROSE-NACL 20-5-0.45 MEQ/L-%-% IV SOLN
INTRAVENOUS | Status: AC
  Filled 2023-09-04: qty 1000

## 2023-09-04 MED ORDER — GABAPENTIN 100 MG PO CAPS
200.0000 mg | ORAL_CAPSULE | Freq: Every day | ORAL | Status: DC
  Administered 2023-09-04 – 2023-09-06 (×3): 200 mg via ORAL
  Filled 2023-09-04 (×3): qty 2

## 2023-09-04 MED ORDER — MAGIC MOUTHWASH: 15.0000 mL | Freq: Four times a day (QID) | ORAL | Status: DC | PRN

## 2023-09-04 MED ORDER — ENOXAPARIN SODIUM 40 MG/0.4ML IJ SOSY
40.0000 mg | PREFILLED_SYRINGE | INTRAMUSCULAR | Status: DC
  Administered 2023-09-05: 40 mg via SUBCUTANEOUS
  Filled 2023-09-04: qty 0.4

## 2023-09-04 MED ORDER — MELATONIN 3 MG PO TABS
3.0000 mg | ORAL_TABLET | Freq: Every evening | ORAL | Status: DC | PRN
  Administered 2023-09-08: 3 mg via ORAL
  Filled 2023-09-04: qty 1

## 2023-09-04 MED ORDER — ALUM & MAG HYDROXIDE-SIMETH 200-200-20 MG/5ML PO SUSP: 30.0000 mL | Freq: Four times a day (QID) | ORAL | Status: DC | PRN

## 2023-09-04 MED ORDER — STERILE WATER FOR INJECTION IJ SOLN
INTRAMUSCULAR | Status: AC
  Filled 2023-09-04: qty 10

## 2023-09-04 MED ORDER — ONDANSETRON HCL 4 MG/2ML IJ SOLN
INTRAMUSCULAR | Status: AC
  Administered 2023-09-04: 4 mg via INTRAVENOUS
  Filled 2023-09-04: qty 2

## 2023-09-04 MED ORDER — OXYCODONE HCL 5 MG/5ML PO SOLN: 5.0000 mg | Freq: Once | ORAL | Status: DC | PRN

## 2023-09-04 MED ORDER — MENTHOL 3 MG MT LOZG: 1.0000 | LOZENGE | OROMUCOSAL | Status: DC | PRN

## 2023-09-04 MED ORDER — SUGAMMADEX SODIUM 200 MG/2ML IV SOLN
INTRAVENOUS | Status: DC | PRN
  Administered 2023-09-04: 110 mg via INTRAVENOUS

## 2023-09-04 MED ORDER — OYSTER SHELL CALCIUM/D3 500-5 MG-MCG PO TABS
1.0000 | ORAL_TABLET | Freq: Two times a day (BID) | ORAL | Status: DC
  Administered 2023-09-04 – 2023-09-08 (×8): 1 via ORAL
  Filled 2023-09-04 (×8): qty 1

## 2023-09-04 MED ORDER — ONDANSETRON HCL 4 MG/2ML IJ SOLN
INTRAMUSCULAR | Status: AC
  Filled 2023-09-04: qty 2

## 2023-09-04 MED ORDER — LACTATED RINGERS IV SOLN: Freq: Three times a day (TID) | INTRAVENOUS | Status: AC | PRN

## 2023-09-04 MED ORDER — LEVOTHYROXINE SODIUM 150 MCG PO TABS
150.0000 ug | ORAL_TABLET | Freq: Every day | ORAL | Status: DC
  Administered 2023-09-05 – 2023-09-17 (×13): 150 ug via ORAL
  Filled 2023-09-04: qty 2
  Filled 2023-09-04: qty 1
  Filled 2023-09-04 (×5): qty 2
  Filled 2023-09-04 (×3): qty 1
  Filled 2023-09-04 (×3): qty 2
  Filled 2023-09-04 (×4): qty 1
  Filled 2023-09-04 (×2): qty 2
  Filled 2023-09-04 (×2): qty 1
  Filled 2023-09-04: qty 2
  Filled 2023-09-04 (×2): qty 1
  Filled 2023-09-04: qty 2
  Filled 2023-09-04: qty 1

## 2023-09-04 MED ORDER — SODIUM CHLORIDE (PF) 0.9 % IJ SOLN
INTRAMUSCULAR | Status: AC
  Filled 2023-09-04: qty 10

## 2023-09-04 MED ORDER — ACETAMINOPHEN 500 MG PO TABS
1000.0000 mg | ORAL_TABLET | Freq: Three times a day (TID) | ORAL | Status: DC
  Administered 2023-09-04 – 2023-09-08 (×13): 1000 mg via ORAL
  Filled 2023-09-04 (×13): qty 2

## 2023-09-04 MED ORDER — SIMETHICONE 80 MG PO CHEW: 40.0000 mg | CHEWABLE_TABLET | Freq: Four times a day (QID) | ORAL | Status: DC | PRN

## 2023-09-04 MED ORDER — ENSURE SURGERY PO LIQD
237.0000 mL | Freq: Two times a day (BID) | ORAL | Status: DC
  Administered 2023-09-05 – 2023-09-08 (×6): 237 mL via ORAL

## 2023-09-04 MED ORDER — SALINE SPRAY 0.65 % NA SOLN
1.0000 | Freq: Four times a day (QID) | NASAL | Status: DC | PRN
Start: 2023-09-04 — End: 2023-09-17

## 2023-09-04 MED ORDER — FENTANYL CITRATE (PF) 100 MCG/2ML IJ SOLN
INTRAMUSCULAR | Status: DC | PRN
  Administered 2023-09-04 (×3): 25 ug via INTRAVENOUS

## 2023-09-04 MED ORDER — ONDANSETRON HCL 4 MG/2ML IJ SOLN
INTRAMUSCULAR | Status: DC | PRN
  Administered 2023-09-04: 4 mg via INTRAVENOUS

## 2023-09-04 MED ORDER — BUPIVACAINE LIPOSOME 1.3 % IJ SUSP
INTRAMUSCULAR | Status: DC | PRN
  Administered 2023-09-04: 20 mL

## 2023-09-04 MED ORDER — IRBESARTAN 150 MG PO TABS
150.0000 mg | ORAL_TABLET | Freq: Every day | ORAL | Status: DC
  Administered 2023-09-05: 150 mg via ORAL
  Filled 2023-09-04: qty 1

## 2023-09-04 MED ORDER — PHENYLEPHRINE HCL-NACL 20-0.9 MG/250ML-% IV SOLN
INTRAVENOUS | Status: DC | PRN
  Administered 2023-09-04: 50 ug/min via INTRAVENOUS

## 2023-09-04 MED ORDER — BUPIVACAINE-EPINEPHRINE 0.25% -1:200000 IJ SOLN
INTRAMUSCULAR | Status: AC
  Filled 2023-09-04: qty 1

## 2023-09-04 MED ORDER — DEXAMETHASONE SODIUM PHOSPHATE 10 MG/ML IJ SOLN
INTRAMUSCULAR | Status: AC
  Filled 2023-09-04: qty 1

## 2023-09-04 MED ORDER — LACTATED RINGERS IR SOLN
Status: DC | PRN
  Administered 2023-09-04: 1000 mL

## 2023-09-04 SURGICAL SUPPLY — 90 items
APPLIER CLIP 5 13 M/L LIGAMAX5 (MISCELLANEOUS)
APPLIER CLIP ROT 10 11.4 M/L (STAPLE)
BAG COUNTER SPONGE SURGICOUNT (BAG) ×1 IMPLANT
BLADE EXTENDED COATED 6.5IN (ELECTRODE) IMPLANT
CANNULA REDUCER 12-8 DVNC XI (CANNULA) IMPLANT
CHLORAPREP W/TINT 26 (MISCELLANEOUS) IMPLANT
CLIP APPLIE 5 13 M/L LIGAMAX5 (MISCELLANEOUS) IMPLANT
CLIP APPLIE ROT 10 11.4 M/L (STAPLE) IMPLANT
COVER SURGICAL LIGHT HANDLE (MISCELLANEOUS) ×2 IMPLANT
COVER TIP SHEARS 8 DVNC (MISCELLANEOUS) ×1 IMPLANT
DEVICE TROCAR PUNCTURE CLOSURE (ENDOMECHANICALS) IMPLANT
DRAIN CHANNEL 19F RND (DRAIN) IMPLANT
DRAPE ARM DVNC X/XI (DISPOSABLE) ×4 IMPLANT
DRAPE COLUMN DVNC XI (DISPOSABLE) ×1 IMPLANT
DRAPE SURG IRRIG POUCH 19X23 (DRAPES) ×1 IMPLANT
DRIVER NDL LRG 8 DVNC XI (INSTRUMENTS) ×1 IMPLANT
DRIVER NDLE LRG 8 DVNC XI (INSTRUMENTS) ×1
DRSG OPSITE POSTOP 4X10 (GAUZE/BANDAGES/DRESSINGS) IMPLANT
DRSG OPSITE POSTOP 4X6 (GAUZE/BANDAGES/DRESSINGS) IMPLANT
DRSG OPSITE POSTOP 4X8 (GAUZE/BANDAGES/DRESSINGS) IMPLANT
DRSG TEGADERM 2-3/8X2-3/4 SM (GAUZE/BANDAGES/DRESSINGS) ×5 IMPLANT
DRSG TEGADERM 4X4.75 (GAUZE/BANDAGES/DRESSINGS) IMPLANT
ELECT PENCIL ROCKER SW 15FT (MISCELLANEOUS) ×1 IMPLANT
ELECT REM PT RETURN 15FT ADLT (MISCELLANEOUS) ×1 IMPLANT
ENDOLOOP SUT PDS II 0 18 (SUTURE) IMPLANT
EVACUATOR SILICONE 100CC (DRAIN) IMPLANT
GAUZE SPONGE 2X2 8PLY STRL LF (GAUZE/BANDAGES/DRESSINGS) ×1 IMPLANT
GLOVE ECLIPSE 8.0 STRL XLNG CF (GLOVE) ×3 IMPLANT
GLOVE INDICATOR 8.0 STRL GRN (GLOVE) ×3 IMPLANT
GOWN SRG XL LVL 4 BRTHBL STRL (GOWNS) ×1 IMPLANT
GOWN STRL REUS W/ TWL XL LVL3 (GOWN DISPOSABLE) ×4 IMPLANT
GRASPER SUT TROCAR 14GX15 (MISCELLANEOUS) IMPLANT
GRASPER TIP-UP FEN DVNC XI (INSTRUMENTS) ×1 IMPLANT
HOLDER FOLEY CATH W/STRAP (MISCELLANEOUS) ×1 IMPLANT
IRRIG SUCT STRYKERFLOW 2 WTIP (MISCELLANEOUS) ×1
IRRIGATION SUCT STRKRFLW 2 WTP (MISCELLANEOUS) ×1 IMPLANT
KIT PROCEDURE DVNC SI (MISCELLANEOUS) IMPLANT
KIT SIGMOIDOSCOPE (SET/KITS/TRAYS/PACK) IMPLANT
KIT TURNOVER KIT A (KITS) IMPLANT
NDL INSUFFLATION 14GA 120MM (NEEDLE) ×1 IMPLANT
NEEDLE INSUFFLATION 14GA 120MM (NEEDLE) ×1
PACK CARDIOVASCULAR III (CUSTOM PROCEDURE TRAY) ×1 IMPLANT
PACK COLON (CUSTOM PROCEDURE TRAY) ×1 IMPLANT
PAD POSITIONING PINK XL (MISCELLANEOUS) ×1 IMPLANT
PROTECTOR NERVE ULNAR (MISCELLANEOUS) ×2 IMPLANT
RELOAD STAPLE 45 3.5 BLU DVNC (STAPLE) IMPLANT
RELOAD STAPLE 45 4.3 GRN DVNC (STAPLE) IMPLANT
RELOAD STAPLE 60 3.5 BLU DVNC (STAPLE) IMPLANT
RELOAD STAPLE 60 4.3 GRN DVNC (STAPLE) IMPLANT
RETRACTOR WND ALEXIS 18 MED (MISCELLANEOUS) IMPLANT
RTRCTR WOUND ALEXIS 18CM MED (MISCELLANEOUS)
SCISSORS LAP 5X35 DISP (ENDOMECHANICALS) ×1 IMPLANT
SCISSORS MNPLR CVD DVNC XI (INSTRUMENTS) ×1 IMPLANT
SEAL UNIV 5-12 XI (MISCELLANEOUS) ×4 IMPLANT
SEALER VESSEL EXT DVNC XI (MISCELLANEOUS) ×1 IMPLANT
SOL ELECTROSURG ANTI STICK (MISCELLANEOUS) ×1
SOLUTION ELECTROSURG ANTI STCK (MISCELLANEOUS) ×1 IMPLANT
SPIKE FLUID TRANSFER (MISCELLANEOUS) ×1 IMPLANT
STAPLER 45 SUREFORM DVNC (STAPLE) IMPLANT
STAPLER 60 SUREFORM DVNC (STAPLE) IMPLANT
STAPLER ECHELON POWER CIR 29 (STAPLE) IMPLANT
STAPLER ECHELON POWER CIR 31 (STAPLE) IMPLANT
STAPLER RELOAD 3.5X45 BLU DVNC (STAPLE)
STAPLER RELOAD 3.5X60 BLU DVNC (STAPLE)
STAPLER RELOAD 4.3X45 GRN DVNC (STAPLE)
STAPLER RELOAD 4.3X60 GRN DVNC (STAPLE) ×1
STOPCOCK 4 WAY LG BORE MALE ST (IV SETS) ×2 IMPLANT
SURGILUBE 2OZ TUBE FLIPTOP (MISCELLANEOUS) IMPLANT
SUT MNCRL AB 4-0 PS2 18 (SUTURE) ×1 IMPLANT
SUT PDS AB 1 CT1 27 (SUTURE) ×2 IMPLANT
SUT PROLENE 0 CT 2 (SUTURE) IMPLANT
SUT PROLENE 2 0 KS (SUTURE) IMPLANT
SUT PROLENE 2 0 SH DA (SUTURE) IMPLANT
SUT SILK 2 0 SH CR/8 (SUTURE) IMPLANT
SUT SILK 3 0 SH CR/8 (SUTURE) ×1 IMPLANT
SUT V-LOC BARB 180 2/0GR6 GS22 (SUTURE) ×1
SUT VIC AB 3-0 SH 18 (SUTURE) IMPLANT
SUT VIC AB 3-0 SH 27XBRD (SUTURE) IMPLANT
SUT VICRYL 0 UR6 27IN ABS (SUTURE) IMPLANT
SUTURE V-LC BRB 180 2/0GR6GS22 (SUTURE) IMPLANT
SYR 20ML ECCENTRIC (SYRINGE) ×1 IMPLANT
SYS LAPSCP GELPORT 120MM (MISCELLANEOUS)
SYS WOUND ALEXIS 18CM MED (MISCELLANEOUS) ×1
SYSTEM LAPSCP GELPORT 120MM (MISCELLANEOUS) IMPLANT
SYSTEM WOUND ALEXIS 18CM MED (MISCELLANEOUS) ×1 IMPLANT
TOWEL OR NON WOVEN STRL DISP B (DISPOSABLE) ×1 IMPLANT
TRAY FOLEY MTR SLVR 16FR STAT (SET/KITS/TRAYS/PACK) ×1 IMPLANT
TROCAR ADV FIXATION 5X100MM (TROCAR) ×1 IMPLANT
TUBING CONNECTING 10 (TUBING) ×2 IMPLANT
TUBING INSUFFLATION 10FT LAP (TUBING) ×1 IMPLANT

## 2023-09-04 NOTE — Discharge Instructions (Addendum)
SURGERY: POST OP INSTRUCTIONS (Surgery for small bowel obstruction, colon resection, etc)   ######################################################################  EAT Gradually transition to a high fiber diet with a fiber supplement over the next few days after discharge  WALK Walk an hour a day.  Control your pain to do that.    CONTROL PAIN Control pain so that you can walk, sleep, tolerate sneezing/coughing, go up/down stairs.  HAVE A BOWEL MOVEMENT DAILY Keep your bowels regular to avoid problems.  OK to try a laxative to override constipation.  OK to use an antidairrheal to slow down diarrhea.  Call if not better after 2 tries  CALL IF YOU HAVE PROBLEMS/CONCERNS Call if you are still struggling despite following these instructions. Call if you have concerns not answered by these instructions  ######################################################################   DIET Follow a light diet the first few days at home.  Start with a bland diet such as soups, liquids, starchy foods, low fat foods, etc.  If you feel full, bloated, or constipated, stay on a ful liquid or pureed/blenderized diet for a few days until you feel better and no longer constipated. Be sure to drink plenty of fluids every day to avoid getting dehydrated (feeling dizzy, not urinating, etc.). Gradually add a fiber supplement to your diet over the next week.  Gradually get back to a regular solid diet.  Avoid fast food or heavy meals the first week as you are more likely to get nauseated. It is expected for your digestive tract to need a few months to get back to normal.  It is common for your bowel movements and stools to be irregular.  You will have occasional bloating and cramping that should eventually fade away.  Until you are eating solid food normally, off all pain medications, and back to regular activities; your bowels will not be normal. Focus on eating a low-fat, high fiber diet the rest of your life  (See Getting to Good Bowel Health, below).  CARE of your INCISION or WOUND  It is good for closed incisions and even open wounds to be washed every day.  Shower every day.  Short baths are fine.  Wash the incisions and wounds clean with soap & water.    You may leave closed incisions open to air if it is dry.   You may cover the incision with clean gauze & replace it after your daily shower for comfort.  TEGADERM:  You have clear gauze band-aid dressings over your closed incision(s).  Remove the dressings 2 days after surgery = 11/29 Friday    If you have an open wound with a wound vac, see wound vac care instructions.    ACTIVITIES as tolerated Start light daily activities --- self-care, walking, climbing stairs-- beginning the day after surgery.  Gradually increase activities as tolerated.  Control your pain to be active.  Stop when you are tired.  Ideally, walk several times a day, eventually an hour a day.   Most people are back to most day-to-day activities in a few weeks.  It takes 4-8 weeks to get back to unrestricted, intense activity. If you can walk 30 minutes without difficulty, it is safe to try more intense activity such as jogging, treadmill, bicycling, low-impact aerobics, swimming, etc. Save the most intensive and strenuous activity for last (Usually 4-8 weeks after surgery) such as sit-ups, heavy lifting, contact sports, etc.  Refrain from any intense heavy lifting or straining until you are off narcotics for pain control.  You will have  off days, but things should improve week-by-week. DO NOT PUSH THROUGH PAIN.  Let pain be your guide: If it hurts to do something, don't do it.  Pain is your body warning you to avoid that activity for another week until the pain goes down. You may drive when you are no longer taking narcotic prescription pain medication, you can comfortably wear a seatbelt, and you can safely make sudden turns/stops to protect yourself without hesitating due to  pain. You may have sexual intercourse when it is comfortable. If it hurts to do something, stop.   MEDICATIONS Take your usually prescribed home medications unless otherwise directed.    Blood thinners:  You can restart any strong blood thinners after the second postoperative day  for example: COUMADIN (warfarin), XERELTO (rivaroxaban), ELIQUIS (apixaban), PLAVIX (clopidigrel), BRILINTA (ticagrelor), EFFIENT (prasugrel), PRADAXA (dabigatran), etc  Continue aspirin before & after surgery..     Some oozing/bleeding the first 1-2 weeks is common but should taper down & be small volume.    If you are passing many large clots or having uncontrolling bleeding, call your surgeon    PAIN CONTROL Pain after surgery or related to activity is often due to strain/injury to muscle, tendon, nerves and/or incisions.  This pain is usually short-term and will improve in a few months.  To help speed the process of healing and to get back to regular activity more quickly, DO THE FOLLOWING THINGS TOGETHER: Increase activity gradually.  DO NOT PUSH THROUGH PAIN Use Ice and/or Heat Try Gentle Massage and/or Stretching Take over the counter pain medication Take Narcotic prescription pain medication for more severe pain  Good pain control = faster recovery.  It is better to take more medicine to be more active than to stay in bed all day to avoid medications.  Increase activity gradually Avoid heavy lifting at first, then increase to lifting as tolerated over the next 6 weeks. Do not "push through" the pain.  Listen to your body and avoid positions and maneuvers than reproduce the pain.  Wait a few days before trying something more intense Walking an hour a day is encouraged to help your body recover faster and more safely.  Start slowly and stop when getting sore.  If you can walk 30 minutes without stopping or pain, you can try more intense activity (running, jogging, aerobics, cycling, swimming,  treadmill, sex, sports, weightlifting, etc.) Remember: If it hurts to do it, then don't do it! Use Ice and/or Heat You will have swelling and bruising around the incisions.  This will take several weeks to resolve. Ice packs or heating pads (6-8 times a day, 30-60 minutes at a time) will help sooth soreness & bruising. Some people prefer to use ice alone, heat alone, or alternate between ice & heat.  Experiment and see what works best for you.  Consider trying ice for the first few days to help decrease swelling and bruising; then, switch to heat to help relax sore spots and speed recovery. Shower every day.  Short baths are fine.  It feels good!  Keep the incisions and wounds clean with soap & water.   Try Gentle Massage and/or Stretching Massage at the area of pain many times a day Stop if you feel pain - do not overdo it Take over the counter pain medication This helps the muscle and nerve tissues become less irritable and calm down faster Choose ONE of the following over-the-counter anti-inflammatory medications: Acetaminophen 500mg  tabs (Tylenol) 1-2 pills with every  meal and just before bedtime (avoid if you have liver problems or if you have acetaminophen in you narcotic prescription) Naproxen 220mg  tabs (ex. Aleve, Naprosyn) 1-2 pills twice a day (avoid if you have kidney, stomach, IBD, or bleeding problems) Ibuprofen 200mg  tabs (ex. Advil, Motrin) 3-4 pills with every meal and just before bedtime (avoid if you have kidney, stomach, IBD, or bleeding problems) Take with food/snack several times a day as directed for at least 2 weeks to help keep pain / soreness down & more manageable. Take Narcotic prescription pain medication for more severe pain A prescription for strong pain control is often given to you upon discharge (for example: oxycodone/Percocet, hydrocodone/Norco/Vicodin, or tramadol/Ultram) Take your pain medication as prescribed. Be mindful that most narcotic prescriptions  contain Tylenol (acetaminophen) as well - avoid taking too much Tylenol. If you are having problems/concerns with the prescription medicine (does not control pain, nausea, vomiting, rash, itching, etc.), please call us [phone] to see if we need to switch you to a different pain medicine that will work better for you and/or control your side effects better. If you need a refill on your pain medication, you must call the office before 4 pm and on weekdays only.  By federal law, prescriptions for narcotics cannot be called into a pharmacy.  They must be filled out on paper & picked up from our office by the patient or authorized caretaker.  Prescriptions cannot be filled after 4 pm nor on weekends.    WHEN TO CALL us [phone] Severe uncontrolled or worsening pain  Fever over 101 F (38.5 C) Concerns with the incision: Worsening pain, redness, rash/hives, swelling, bleeding, or drainage Reactions / problems with new medications (itching, rash, hives, nausea, etc.) Nausea and/or vomiting Difficulty urinating Difficulty breathing Worsening fatigue, dizziness, lightheadedness, blurred vision Other concerns If you are not getting better after two weeks or are noticing you are getting worse, contact our office [phone] for further advice.  We may need to adjust your medications, re-evaluate you in the office, send you to the emergency room, or see what other things we can do to help. The clinic staff is available to answer your questions during regular business hours (8:30am-5pm).  Please don't hesitate to call and ask to speak to one of our nurses for clinical concerns.    A surgeon from [facility] is always on call at the hospitals 24 hours/day If you have a medical emergency, go to the nearest emergency room or call 911.  FOLLOW UP in our office One the day of your discharge from the hospital (or the next business weekday), please call [facility] to set up  or confirm an appointment to see your surgeon in the office for a follow-up appointment.  Usually it is 2-3 weeks after your surgery.   If you have skin staples at your incision(s), let the office know so we can set up a time in the office for the nurse to remove them (usually around 10 days after surgery). Make sure that you call for appointments the day of discharge (or the next business weekday) from the hospital to ensure a convenient appointment time. IF YOU HAVE DISABILITY OR FAMILY LEAVE FORMS, BRING THEM TO THE OFFICE FOR PROCESSING.  DO NOT GIVE THEM TO YOUR DOCTOR.  [facility] [address], [place], [place]  [address] ? [phone] - Main [phone] - Toll Free,  [phone] - Fax [email or web address]  GETTING TO GOOD BOWEL HEALTH. It is expected for your digestive tract to need a few months to get back to normal.  It is common for your bowel movements and stools to be irregular.  You will have occasional bloating and cramping that should eventually fade away.  Until you are eating solid food normally, off all pain medications, and back to regular activities; your bowels will not be normal.   Avoiding constipation The goal: ONE SOFT BOWEL MOVEMENT A DAY!    Drink plenty of fluids.  Choose water first. TAKE A FIBER SUPPLEMENT EVERY DAY THE REST OF YOUR LIFE During your first week back home, gradually add back a fiber supplement every day Experiment which form you can tolerate.   There are many forms such as powders, tablets, wafers, gummies, etc Psyllium bran (Metamucil), methylcellulose (Citrucel), Miralax or Glycolax, Benefiber, Flax Seed.  Adjust the dose week-by-week (1/2 dose/day to 6 doses a day) until you are moving your bowels 1-2 times a day.  Cut back the dose or try a different fiber product if it is giving you problems such as diarrhea or bloating. Sometimes a laxative is needed to help jump-start bowels if  constipated until the fiber supplement can help regulate your bowels.  If you are tolerating eating & you are farting, it is okay to try a gentle laxative such as double dose MiraLax, prune juice, or Milk of Magnesia.  Avoid using laxatives too often. Stool softeners can sometimes help counteract the constipating effects of narcotic pain medicines.  It can also cause diarrhea, so avoid using for too long. If you are still constipated despite taking fiber daily, eating solids, and a few doses of laxatives, call our office. Controlling diarrhea Try drinking liquids and eating bland foods for a few days to avoid stressing your intestines further. Avoid dairy products (especially milk & ice cream) for a short time.  The intestines often can lose the ability to digest lactose when stressed. Avoid foods that cause gassiness or bloating.  Typical foods include beans and other legumes, cabbage, broccoli, and dairy foods.  Avoid greasy, spicy, fast foods.  Every person has some sensitivity to other foods, so listen to your body and avoid those foods that trigger problems for you. Probiotics (such as active yogurt, Align, etc) may help repopulate the intestines and colon with normal bacteria and calm down a sensitive digestive tract Adding a fiber supplement gradually can help thicken stools by absorbing excess fluid and retrain the intestines to act more normally.  Slowly increase the dose over a few weeks.  Too much fiber too soon can backfire and cause cramping & bloating. It is okay to try and slow down diarrhea with a few doses of antidiarrheal medicines.   Bismuth subsalicylate (ex. Kayopectate, Pepto Bismol) for a few doses can help control diarrhea.  Avoid if pregnant.   Loperamide (Imodium) can slow down diarrhea.  Start with one tablet (2mg ) first.  Avoid if you are having fevers or severe pain.  ILEOSTOMY PATIENTS WILL HAVE CHRONIC DIARRHEA since their colon is not in use.    Drink plenty of liquids.   You will need to drink even more glasses of water/liquid a day to avoid getting dehydrated. Record output from your ileostomy.  Expect to empty the bag every 3-4 hours at first.  Most people with a permanent ileostomy empty their bag 4-6 times at the least.   Use antidiarrheal medicine (especially Imodium) several times a day to avoid getting dehydrated.  Start with a dose at bedtime & breakfast.  Adjust up or down as needed.  Increase antidiarrheal medications as directed to avoid emptying the bag more than 8 times a day (every 3 hours). Work with your wound ostomy nurse to learn care for your ostomy.  See ostomy care instructions. TROUBLESHOOTING IRREGULAR BOWELS 1) Start with a soft & bland diet. No spicy, greasy, or fried foods.  2) Avoid gluten/wheat or dairy products from diet to see if symptoms improve. 3) Miralax 17gm or flax seed mixed in 8oz. water or juice-daily. May use 2-4 times a day as needed. 4) Gas-X, Phazyme, etc. as needed for gas & bloating.  5) Prilosec (omeprazole) over-the-counter as needed 6)  Consider probiotics (Align, Activa, etc) to help calm the bowels down  Call your doctor if you are getting worse or not getting better.  Sometimes further testing (cultures, endoscopy, X-ray studies, CT scans, bloodwork, etc.) may be needed to help diagnose and treat the cause of the diarrhea. [facility] [address], [place], [place]  [address] [phone] - Main.    [phone]  - Toll Free.   [phone] - Fax [email or web address]   Pelvic floor muscle training exercises ("Kegels") can help strengthen the muscles under the uterus, bladder, and bowel (large intestine). They can help both men and women who have problems with urine leakage or bowel control.  A pelvic floor muscle training exercise is like pretending that you have to urinate, and then holding it. You relax and tighten the muscles that control urine flow. It's  important to find the right muscles to tighten.  The next time you have to urinate, start to go and then stop. Feel the muscles in your vagina, bladder, or anus get tight and move up. These are the pelvic floor muscles. If you feel them tighten, you've done the exercise right. If you are still not sure whether you are tightening the right muscles, keep in mind that all of the muscles of the pelvic floor relax and contract at the same time. Because these muscles control the bladder, rectum, and vagina, the following tips may help: Women: Insert a finger into your vagina. Tighten the muscles as if you are holding in your urine, then let go. You should feel the muscles tighten and move up and down.  Men: Insert a finger into your rectum. Tighten the muscles as if you are holding in your urine, then let go. You should feel the muscles tighten and move up and down. These are the same muscles you would tighten if you were trying to prevent yourself from passing gas.  It is very important that you keep the following muscles relaxed while doing pelvic floor muscle training exercises: Abdominal  Buttocks (the deeper, anal sphincter muscle should contract)  Thigh   A woman can also strengthen these muscles by using a vaginal cone, which is a weighted device that is inserted into the vagina. Then you try to tighten the pelvic floor muscles to hold the device in place. If you are unsure whether you are doing the pelvic floor muscle training correctly, you can use biofeedback and electrical stimulation to help find the correct muscle group to work. Biofeedback is a method of positive reinforcement. Electrodes are placed on the abdomen and along the anal area. Some therapists place a sensor in the vagina in women or anus in men to monitor the contraction of pelvic floor muscles.  A monitor will display a  graph showing which muscles are contracting and which are at rest. The therapist can help find the right muscles  for performing pelvic floor muscle training exercises.   PERFORMING PELVIC FLOOR EXERCISES: 1. Begin by emptying your bladder. 2. Tighten the pelvic floor muscles and hold for a count of 10. 3. Relax the muscles completely for a count of 10. 4. Do 10 repititions, 3 to 5 times a day (morning, afternoon, and night). You can do these exercises at any time and any place. Most people prefer to do the exercises while lying down or sitting in a chair. After 4 - 6 weeks, most people notice some improvement. It may take as long as 3 months to see a major change. After a couple of weeks, you can also try doing a single pelvic floor contraction at times when you are likely to leak (for example, while getting out of a chair). A word of caution: Some people feel that they can speed up the progress by increasing the number of repetitions and the frequency of exercises. However, over-exercising can instead cause muscle fatigue and increase urine leakage. If you feel any discomfort in your abdomen or back while doing these exercises, you are probably doing them wrong. Breathe deeply and relax your body when you are doing these exercises. Make sure you are not tightening your stomach, thigh, buttock, or chest muscles. When done the right way, pelvic floor muscle exercises have been shown to be very effective at improving urinary continence.  Pelvic Floor Pain / Incontinence  Do you suffer from pelvic pain or incontinence? Do you have pain in the pelvis, low back or hips that is associated with sitting, walking, urination or intercourse? Have you experienced leaking of urine or feces when coughing, sneezing or laughing? Do you have pain in the pelvic area associated with cancer?  These are conditions that are common with pelvic floor muscle dysfunction. Over time, due to stress, scar tissue, surgeries and the natural course of aging, our muscles may become weak or overstressed and can spasm. This can lead to pain,  weakness, incontinence or decreased quality of life.  Men and women with pelvic floor dysfunction frequently describe:  A "falling out" feeling. Pain or burning in the abdomen, tailbone or perineal area. Constipation or bowel elimination problems or difficulty initiating urination. Unresolved low back or hip pain. Frequency and urgency when going to the bathroom. Leaking of urine or feces. Pain with intercourse.  [email or web address]  To make a referral or for more information about [facility] Pelvic Floor Therapy Program, call  [facility]) - [phone] [place] [address] [phone] [place]) - [phone] [facility]) - [phone]

## 2023-09-04 NOTE — Progress Notes (Signed)
Unable to ambulate patient at this time due to drowsiness from anaesthesia.

## 2023-09-04 NOTE — Anesthesia Procedure Notes (Signed)
Procedure Name: Intubation Date/Time: 09/04/2023 8:34 AM  Performed by: Florene Route, CRNAPre-anesthesia Checklist: Patient identified, Emergency Drugs available, Suction available and Patient being monitored Patient Re-evaluated:Patient Re-evaluated prior to induction Oxygen Delivery Method: Circle system utilized Preoxygenation: Pre-oxygenation with 100% oxygen Induction Type: IV induction Ventilation: Mask ventilation without difficulty Laryngoscope Size: Miller and 2 Grade View: Grade I Tube type: Oral Tube size: 7.0 mm Number of attempts: 1 Airway Equipment and Method: Stylet Placement Confirmation: ETT inserted through vocal cords under direct vision, positive ETCO2 and breath sounds checked- equal and bilateral Secured at: 20 cm Tube secured with: Tape Dental Injury: Teeth and Oropharynx as per pre-operative assessment

## 2023-09-04 NOTE — Plan of Care (Signed)
Problem: Education: Goal: Understanding of discharge needs will improve Outcome: Progressing   Problem: Education: Goal: Knowledge of General Education information will improve Description: Including pain rating scale, medication(s)/side effects and non-pharmacologic comfort measures Outcome: Progressing

## 2023-09-04 NOTE — H&P (Signed)
09/04/2023     REFERRING PHYSICIAN: Charm Barges, MD  Patient Care Team: Charm Barges, MD as PCP - General (Internal Medicine) Olena Heckle, MD as Consulting Provider (Cardiovascular Disease) Carman Ching, [place] (Urology) Heloise Beecham, MD (Urology) Lyn Hollingshead Robyne Askew, MD as Consulting Provider (Cardiovascular Disease)  PROVIDER: Jarrett Soho, MD  [facility] MRN: [id number] DOB: [birth year]  SUBJECTIVE   Chief Complaint: New Consultation (- Rectal Prolapse)   Alexa Pham is a 89 y.o. female  who is seen today as an office consultation  at the request of DrLorin Picket  for evaluation of her prolapse.  History of Present Illness:  89 year old female. Here with her son. She has been trying with a lot of pelvic floor issues. Claims she gets leakage of stool and incontinence. Has seen urology for urinary issues as well. Followed for a renal mass. Concern for rectal prolapse. Had an episode of discomfort and went to the ER last month. She noted for over a month it was out all the time. Had to put sugar on it. It finally started to go into a little more regularly but it does not take much for her to pop out. She is confirmed to have complete rectal prolapse on examination at the urology office with standing and straining. Had some posterior pelvic prolapse as well.  She usually has a bowel movement every morning. Not on something specifically. However at times she strains to urinate she will have fecal stooling everywhere. She has had to wear a diaper for the past few months. Had some pain and discomfort. No fevers or chills. She has had no urinary tract infections. She had hysterectomy without ovary removal many decades ago. She had a cholecystectomy many decades ago. She has not had a colonoscopy in at least over a decade. She did have coronary disease requiring stenting in 2021 and followed by Dr. Beverly Sessions. She is on Plavix. She lives by herself. Her son is  very involved. Her husband of 71-1/2 years passed away few years ago. 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 Does not need a walker or wheelchair. Can walk at least 15 minutes without too much difficulty.  Medical History:  Past Medical History:  Diagnosis Date  Bronchiectasis without complication (CMS/HHS-HCC) 11/07/2018  Last Assessment & Plan: Formatting of this note might be different from the original. Breathing stable.  History of breast cancer  History of myocardial infarction  Hypertension  Hypothyroidism   Patient Active Problem List  Diagnosis  Hypertension  Hypothyroidism  History of breast cancer  Urge incontinence  Age-related macular degeneration  Personal history of other malignant neoplasm of skin  Hyperlipidemia, mixed  Atherosclerosis of abdominal aorta (CMS-HCC)  Coronary artery disease involving native coronary artery of native heart  Abnormal chest CT  Bronchiectasis without complication (CMS/HHS-HCC)  Family history of breast cancer  Genetic testing  Hypomagnesemia  Renal mass, left  Shingles  Stress  Weight loss  Dyspnea on exertion  Right ventricular enlargement  Pulmonary hypertension (CMS/HHS-HCC)  Lower extremity edema  Moderate asthma without complication (HHS-HCC)  Leg swelling  Chronic heart failure with preserved ejection fraction (CMS/HHS-HCC)  Preop pulmonary/respiratory exam  Rectal prolapse  SUI (stress urinary incontinence, female)  Chronic anticoagulation  H/O heart artery stent   Past Surgical History:  Procedure Laterality Date  APPENDECTOMY 1945  HYSTERECTOMY 1988  CHOLECYSTECTOMY 1990  MASTECTOMY PARTIAL / LUMPECTOMY Left 1995  Left-sided  THYROIDECTOMY TOTAL 1997  KNEE ARTHROSCOPY Left 1999  Left knee  TONSILLECTOMY  Allergies  Allergen Reactions  Brilinta [Ticagrelor] Shortness Of Breath  Extreme  Oxycontin [Oxycodone] Nausea   Current Outpatient Medications on File Prior to Visit  Medication Sig Dispense Refill   albuterol (PROVENTIL) 2.5 mg /3 mL (0.083 %) nebulizer solution Take 3 mLs (2.5 mg total) by nebulization 2 (two) times daily 180 mL 11  amLODIPine (NORVASC) 2.5 MG tablet Take 1 tablet (2.5 mg total) by mouth 2 (two) times daily 180 tablet 3  antiox #8/om3/dha/epa/lut/zeax (PRESERVISION AREDS 2, OMEGA-3, ORAL) Take 1 tablet by mouth 2 (two) times daily  aspirin 81 MG EC tablet Take 81 mg by mouth once daily  atorvastatin (LIPITOR) 40 MG tablet Take 1 tablet (40 mg total) by mouth once daily 90 tablet 3  calcium citrate-vitamin D3 (CITRACAL+D) 315-200 mg-unit tablet Take by mouth 2 (two) times daily with meals.  carvediloL (COREG) 6.25 MG tablet Take 1 tablet (6.25 mg total) by mouth 2 (two) times daily with meals 180 tablet 3  FUROsemide (LASIX) 20 MG tablet Take 1 tablet (20 mg total) by mouth 2 (two) times daily Or as directed by MD 180 tablet 3  GEMTESA 75 mg Tab Take 1 tablet by mouth once daily  ibandronate (BONIVA) 150 mg tablet Take 150 mg by mouth every 30 (thirty) days  irbesartan (AVAPRO) 150 MG tablet Take 1 tablet (150 mg total) by mouth 2 (two) times daily 180 tablet 3  levothyroxine (SYNTHROID) 137 MCG tablet Take 150 mcg by mouth once daily Take on an empty stomach with a glass of water at least 30-60 minutes before breakfast.  mucus clearing device Devi Use 1 each 2 (two) times daily Acapella green - use according to instructions 1 each 1  multivitamin tablet Take 1 tablet by mouth once daily.  nitroGLYcerin (NITROSTAT) 0.4 MG SL tablet Place 1 tablet (0.4 mg total) under the tongue every 5 (five) minutes as needed for Chest pain May take up to 3 doses. 25 tablet 11  sodium chloride 3 % nebulizer solution Take 4 mLs by nebulization 2 (two) times daily With albuterol 240 mL 11  TRELEGY ELLIPTA 100-62.5-25 mcg inhaler Inhale 1 Puff into the lungs once daily 1 each 11   No current facility-administered medications on file prior to visit.   Family History  Problem Relation Age of  Onset  Myocardial Infarction (Heart attack) Mother  Arthritis Mother  Rheumatoid  No Known Problems Sister    Social History   Tobacco Use  Smoking Status Former  Current packs/day: 0.00  Average packs/day: 1 pack/day for 8.0 years (8.0 ttl pk-yrs)  Types: Cigarettes  Start date: 10/08/1965  Quit date: 10/08/1973  Years since quitting: 49.7  Smokeless Tobacco Never    Social History   Socioeconomic History  Marital status: Widowed  Tobacco Use  Smoking status: Former  Current packs/day: 0.00  Average packs/day: 1 pack/day for 8.0 years (8.0 ttl pk-yrs)  Types: Cigarettes  Start date: 10/08/1965  Quit date: 10/08/1973  Years since quitting: 49.7  Smokeless tobacco: Never  Vaping Use  Vaping status: Never Used  Substance and Sexual Activity  Alcohol use: Yes  Alcohol/week: 0.0 standard drinks of alcohol  Comment: Social  Drug use: Never  Sexual activity: Defer   Social Determinants of Health   Financial Resource Strain: Low Risk (10/22/2022)  Received from [facility], [place]  Overall Financial Resource Strain (CARDIA)  Difficulty of Paying Living Expenses: Not hard at all  Food Insecurity: No Food Insecurity (10/22/2022)  Received from [facility],  [place]  Hunger Vital Sign  Worried About Running Out of Food in the Last Year: Never true  Ran Out of Food in the Last Year: Never true  Transportation Needs: No Transportation Needs (10/22/2022)  Received from [facility], [place]  [facility] - Transportation  Lack of Transportation (Medical): No  Lack of Transportation (Non-Medical): No  Physical Activity: Insufficiently Active (10/22/2022)  Received from [facility], [place]  Exercise Vital Sign  Days of Exercise per Week: 2 days  Minutes of Exercise per Session: 40 min  Stress: No Stress Concern Present (10/22/2022)  Received from [facility], [facility] of Occupational Health - Occupational Stress Questionnaire  Feeling of Stress :  Not at all  Social Connections: Moderately Integrated (10/22/2022)  Received from [facility], [place]  Social Connection and Isolation Panel [NHANES]  Frequency of Communication with Friends and Family: More than three times a week  Frequency of Social Gatherings with Friends and Family: More than three times a week  Attends Religious Services: More than 4 times per year  Active Member of [employer] or Organizations: Yes  Attends Banker Meetings: More than 4 times per year  Marital Status: Widowed   ############################################################  Review of Systems: A complete review of systems (ROS) was obtained from the patient.  We have reviewed this information and discussed as appropriate with the patient.  See HPI as well for other pertinent ROS.  Constitutional: No fevers, chills, sweats. Weight stable Eyes: No vision changes, No discharge HENT: No sore throats, nasal drainage Lymph: No neck swelling, No bruising easily Pulmonary: No cough, productive sputum CV: No orthopnea, PND . No exertional chest/neck/shoulder/arm pain. Patient can walk 20 minutes gradually.   GI: No personal nor family history of GI/colon cancer, inflammatory bowel disease, irritable bowel syndrome, allergy such as Celiac Sprue, dietary/dairy problems, colitis, ulcers nor gastritis. No recent sick contacts/gastroenteritis. No travel outside the country. No changes in diet.  Renal: No UTIs, No hematuria Genital: No drainage, bleeding, masses Musculoskeletal: No severe joint pain. Good ROM major joints Skin: No sores or lesions Heme/Lymph: No easy bleeding. No swollen lymph nodes Neuro: No active seizures. No facial droop Psych: No hallucinations. No agitation  OBJECTIVE   Vitals:  07/09/23 1215  BP: (!) 148/64  Pulse: 80  Temp: 36.2 C (97.2 F)  SpO2: 96%  Weight: 51.8 kg (114 lb 3.2 oz)   Body mass index is 19 kg/m.  PHYSICAL EXAM:  Constitutional: Not  cachectic. Hygeine adequate. Vitals signs as above.  Eyes: Wears glasses - vision corrected,Pupils reactive, normal extraocular movements. Sclera nonicteric Neuro: CN II-XII intact. No major focal sensory defects. No major motor deficits. Lymph: No head/neck/groin lymphadenopathy Psych: No severe agitation. No severe anxiety. Judgment & insight Adequate, Oriented x4, HENT: Normocephalic, Mucus membranes moist. No thrush. Hearing: impaired Neck: Supple, No tracheal deviation. No obvious thyromegaly Chest: No pain to chest wall compression. Good respiratory excursion. No audible wheezing CV: Pulses intact. regular. No major extremity edema Ext: No obvious deformity or contracture. Edema: Present at: Bilateral lower extremities 2+ no venous stasis ulcers. . No cyanosis Skin: No major subcutaneous nodules. Warm and dry Musculoskeletal: Severe joint rigidity not present. No obvious clubbing. No digital petechiae. Mobility: no assist device moving easily without restrictions  Abdomen: Flat Soft. Nondistended. Minimal abdominal discomfort. Hernia: Not present. Diastasis recti: Not present. No hepatomegaly. No splenomegaly.  Genital/Pelvic: Inguinal hernia: Not present. Inguinal lymph nodes: without lymphadenopathy nor hidradenitis.   Rectal:  Perianal skin Mild fecal soiling  Pruritis ani: Mild Pilonidal disease: Not present Condyloma / warts: Not present  Anal fissure: Not present Perirectal abscess/fistula Not present External hemorrhoids Not present  Digital and anoscopic rectal exam Barely tolerated  Sphincter tone Weak squeeze  Hemorrhoidal piles treated internal hemorrhoids. Left lateral with smooth thrombosed hemorrhoid nearly resolved. Prostate: N/A Rectovaginal septum: Thin with mild rectocele Rectal masses: Not present to 7 cm by digital exam.  Other significant findings: Very redundant rectum with internal telescoping and some circumferential prolapse lying decubitus with  straining.  Patient examined with assistance of female Medical Assistant in the room with patient in decubitus position .  ###################################    ###################################################################  Labs, Imaging and Diagnostic Testing:  Located in 'Care Everywhere' section of Epic EMR chart  PRIOR [facility] NOTES:  Not applicable  SURGERY NOTES:  Not applicable  PATHOLOGY:  Not applicable  Assessment and Plan:  DIAGNOSES:  Diagnoses and all orders for this visit:  Rectal prolapse  SUI (stress urinary incontinence, female)  Renal mass, left  Chronic anticoagulation  H/O heart artery stent  Other orders - polyethylene glycol (MIRALAX) powder; Take 233.75 g by mouth once for 1 dose Take according to your procedure prep instructions. - bisacodyL (DULCOLAX) 5 mg EC tablet; Take 4 tablets (20 mg total) by mouth once daily as needed for Constipation for up to 1 dose - metroNIDAZOLE (FLAGYL) 500 MG tablet; Take 2 tablets (1,000 mg total) by mouth 3 (three) times daily for 3 doses SEE BOWEL PREP INSTRUCTIONS: Take 2 tablets at 2pm, 3pm, and 10pm the day prior to your colon operation. - neomycin 500 mg tablet; Take 2 tablets (1,000 mg total) by mouth 3 (three) times daily for 3 doses SEE BOWEL PREP INSTRUCTIONS: Take 2 tablets at 2pm, 3pm, and 10pm the day prior to your colon operation.    ASSESSMENT/PLAN  55 elderly woman who is actually very active and independent who has very strong history and evidence on physical exam of circumferential rectal prolapse with incontinence on chronic diapers. This has been frustrating and embarrassing.  Standard of care is to consider surgery. Robotic rectopexy. Usually the situation there is a lot of redundancy and she requires rectosigmoid resection as well. Looks like she has a lot of redundancy on the CAT scan. Want to do that to minimize recurrence.  The anatomy & physiology of the digestive tract  was discussed. The pathophysiology of rectal prolapse was discussed. Natural history risks without surgery was discussed. I feel the risks of no intervention will lead to serious problems that outweigh the operative risks; therefore, I recommended surgery to treat the pathology. Possible need for sigmoid colectomy to remove redundant colon was discussed. Pexy by suture and probable mesh reinforcement was discussed as well. Laparoscopic & open techniques were discussed.   Risks such as bleeding, infection, abscess, leak, reoperation, possible ostomy, hernia, stroke, heart attack, death, and other risks were discussed. I noted a good likelihood this will help address the problem. Goals of post-operative recovery were discussed as well. We will work to minimize complications. An educational handout on the technique was given as well. Patient and her son asked many appropriate questions. Try to understand the process. Cautioned that the risks of surgery are higher in 89 year old. However she has decent performance status. This is a significant problem causing incontinence wearing diapers and misery. Severely affects her quality of life. She does not love the idea of surgery but certainly does not want to stay  like this and wishes to proceed. The patient and her son expressed understanding & wish to proceed with surgery.  Colonoscopy done yesterday by Dr. Mia Creek noting a few small polyps but no major concerns.  Prep was fair in the proximal colon.  Patient claims she has been on clear liquids, took more Miralax, and did take the oral antibiotics.  Cleared for surgery by cardiology.  History of coronary stenting and on chronic Plavix. She insists that Dr. Lyn Hollingshead in [facility] clear her. Will reach out to them. Hopefully is just a formality at this point. I understand her history above average age 32.

## 2023-09-04 NOTE — Transfer of Care (Signed)
Immediate Anesthesia Transfer of Care Note  Patient: Alexa Pham  Procedure(s) Performed: ROBOTIC RECTOPEXY, RESECTION OF RECTOSIGMOID COLON FLEXIBLE SIGMOIDOSCOPY  Patient Location: PACU  Anesthesia Type:General  Level of Consciousness: drowsy  Airway & Oxygen Therapy: Patient Spontanous Breathing and Patient connected to face mask oxygen  Post-op Assessment: Report given to RN and Post -op Vital signs reviewed and stable  Post vital signs: Reviewed and stable  Last Vitals:  Vitals Value Taken Time  BP 140/59 09/04/23 1130  Temp    Pulse 52 09/04/23 1132  Resp 14 09/04/23 1132  SpO2 100 % 09/04/23 1132  Vitals shown include unfiled device data.  Last Pain:  Vitals:   09/04/23 0714  TempSrc:   PainSc: 0-No pain         Complications: No notable events documented.

## 2023-09-04 NOTE — Anesthesia Postprocedure Evaluation (Signed)
Anesthesia Post Note  Patient: Alexa Pham  Procedure(s) Performed: ROBOTIC RECTOPEXY, RESECTION OF RECTOSIGMOID COLON FLEXIBLE SIGMOIDOSCOPY     Patient location during evaluation: PACU Anesthesia Type: General Level of consciousness: awake and alert Pain management: pain level controlled Vital Signs Assessment: post-procedure vital signs reviewed and stable Respiratory status: spontaneous breathing, nonlabored ventilation, respiratory function stable and patient connected to nasal cannula oxygen Cardiovascular status: blood pressure returned to baseline and stable Postop Assessment: no apparent nausea or vomiting Anesthetic complications: no   No notable events documented.  Last Vitals:  Vitals:   09/04/23 1515 09/04/23 1558  BP: (!) 124/59 (!) 122/55  Pulse: (!) 56 66  Resp: 18 18  Temp: (!) 36.4 C 36.4 C  SpO2: 99% 98%    Last Pain:  Vitals:   09/04/23 1558  TempSrc: Oral  PainSc:                  Won Kreuzer

## 2023-09-04 NOTE — Op Note (Signed)
09/04/2023  11:13 AM  PATIENT:  Alexa Pham  89 y.o. female  Patient Care Team: Dale Tulelake, MD as PCP - General (Internal Medicine) Salena Saner, MD as Consulting Physician (Pulmonary Disease) Elige Radon, MD as Referring Physician (Cardiology) Mia Creek Rossie Muskrat, MD as Consulting Physician (Gastroenterology) Karie Soda, MD as Consulting Physician (General Surgery) Lamar Blinks, MD as Referring Physician (Cardiology) Vanna Scotland, MD as Consulting Physician (Urology)  PRE-OPERATIVE DIAGNOSIS:  RECTAL PROLAPSE  POST-OPERATIVE DIAGNOSIS:  RECTAL PROLAPSE  PROCEDURE:   ROBOTIC LOW ANTERIOR RECTOSIGMOID RESECTION WITH ANASTOMOSIS ROBOTIC RECTOPEXY FLEXIBLE SIGMOIDOSCOPY TRANSVERSUS ABDOMINIS PLANE (TAP) BLOCK - BILATERAL  Surgeons:  Karie Soda, MD  ASSISTANT:  Romie Levee, MD  An experienced assistant was required given the standard of surgical care given the complexity of the case.  This assistant was needed for exposure, dissection, suction, tissue approximation, retraction, perception, etc  ANESTHESIA:  General endotracheal intubation anesthesia (GETA) and Regional TRANSVERSUS ABDOMINIS PLANE (TAP) nerve block -BILATERAL for perioperative & postoperative pain control at the level of the transverse abdominis & preperitoneal spaces along the flank at the anterior axillary line, from subcostal ridge to iliac crest under laparoscopic guidance provided with liposomal bupivacaine (Experel) 20mL mixed with 50 mL of bupivicaine 0.25% with epinephrine  Estimated Blood Loss (EBL):   Total I/O In: 250 [IV Piggyback:250] Out: 175 [Urine:100; Blood:75].   (See anesthesia record)  Delay start of Pharmacological VTE agent (>24hrs) due to concerns of significant anemia, surgical blood loss, or risk of bleeding?:  no  DRAINS: (None) and 19 Fr Blake drain the tip resting in the pelvis  SPECIMEN:  Rectosigmoid (open end proximal) and Distal anastomotic  ring (FINAL DISTAL MARGIN)  DISPOSITION OF SPECIMEN:  Pathology  COUNTS:  Sponge, needle, & instrument counts CORRECT  PLAN OF CARE: Admit to inpatient   PATIENT DISPOSITION:  PACU - hemodynamically stable.  INDICATION: Pleasant elderly but very active and independent woman with worsening rectal prolapse with complete intussusception out/procidentia with fecal incontinence.  Episodes going to the ER.  Struggles with chronic constipation and redundant colon.  I made recommendation for robotic rectopexy and probable resection of redundant rectosigmoid colon.  The anatomy & physiology of the digestive tract was discussed.  The pathophysiology of rectal prolapse was discussed.  Natural history risks without surgery was discussed.   I feel the risks of no intervention will lead to serious problems that outweigh the operative risks; therefore, I recommended surgery to treat the pathology.  Possible need for sigmoid colectomy to remove redundant colon was discussed.  Pexy by suture and probable mesh reinforcement was discussed as well.  Laparoscopic & open techniques were discussed.   Risks such as bleeding, infection, abscess, leak, reoperation, possible ostomy, hernia, stroke, heart attack, death, and other risks were discussed.   I noted a good likelihood this will help address the problem.  Goals of post-operative recovery were discussed as well.  We will work to minimize complications.  An educational handout on the technique was given as well.  Questions were answered.  The patient expresses understanding & wishes to proceed with surgery.   OR FINDINGS: Very redundant sigmoid colon resting in the low pelvis with some adhesions at the left adnexa.  1/2 feet of redundant rectosigmoid colon excised.  Patient has a descending colon to proximal rectal 31 EEA stapled anastomosis that rests 12-13 cm from the anal verge.  CASE DATA: Type of patient?: Elective [facility] Private Case Status of Case? Elective  Scheduled Infection Present  At Time Of Surgery (PATOS)?  NO  DESCRIPTION:   Informed consent was confirmed.  The patient underwent general anaesthesia without difficulty.  The patient was positioned appropriately.  VTE prevention in place.  The patient's abdomen was clipped, prepped, & draped in a sterile fashion.  Surgical timeout confirmed our plan.  Peritoneal entry with a laparoscopic port was obtained using Varess spring needle entry technique in the left upper abdomen as the patient was positioned in reverse Trendelenburg.  I induced carbon dioxide insufflation.  No change in end tidal CO2 measurements.  Full symmetrical abdominal distention.  Initial port was carefully placed.  Camera inspection revealed no injury.  Extra ports were carefully placed under direct laparoscopic visualization, gradually free wispy but moderate omental adhesions in the right upper quadrant and right flank.  Took care to confirm no injury or other concern.  Patient was carefully positioned in Trendelenburg left side up positioning.  Robot was docked.  Did inspection the pelvis.  Very redundant small bowel was allowed to be reduced out of the pelvis to expose a redundant somewhat twisted sigmoid and rectosigmoid colon with some moderate lesions to the left anterior pelvis especially the adnexa and vaginal cuff from prior hysterectomy.  Carefully freed those off robotically to help mobilize that region lateral medial fashion and help straighten the area out.  Patient clearly had moderate redundancy and wanted and wanted to naturally twisted like a sigmoid volvulus.  I suspected I was going to have to do some resection to trim that down.  I scored the peritoneum at the rectosigmoid region closer to the colon to spare mesenteric folds at the level of the sacral promontory and continued scoring of peritoneum using bipolar dissection until I came into the presacral space posterior to the mesorectum.  Elevated the sigmoid  mesentery off the retroperitoneum to identify the left ureter and kept that in its natural position.  I followed and careful dissection to free the mesorectum off the presacral space down to the pelvic floor towards the levators for good posterior rectal mobilzation.    We then came around anteriorly to help mobilize the anterior rectum off the rectovaginal septum.  As it is typically found, patient had very low peritoneal reflection.  Eventually freed the anterior rectal wall of the rectovaginal septum until I came down to the anterior pelvis.  I worked to spare the lateral mesorectal blood supply.  Went down and did digital rectal exam to confirm that had dissected down to the pelvis just above the sphincter complex for good anterior and posterior rectal mobilization.  With that, I could get the mid rectum to stretch up to  the sacral promontory to help straighten out the stretched out rectum.  rectum.  I created a window between the proximal and mid rectal regions where it reached to the sacral promontory under some mild tension.  Transected through the rectosigmoid mesentery and then came down more distally to this proximal rectum to have good blood supply.  I chose a region of the descending/sigmoid junction that had some-adhesions that would come to down to the sacral promontory.  I transected through the rectosigmoid mesentery taking care to spare the IMA pedicle and superior hemorrhoidal pedicle all the way down to that proximal rectum for good blood supply.  Then transected at the proximal rectum using a 60 mm green loaded firing x 1.  Next I took #1 Prolene sutures and did stitches on the right lateral sacral promontory and another left lateral sacrum  for promontory.  Allow the stitch and tails to go to the right and left flanks.  Then create a Pfannenstiel incision and placed a wound protector.  Eviscerated the rectosigmoid colon which is about a foot and half of redundancy.  Then used a  pursestring clamp at the descending sigmoid junction at the preexcised area.  Confirmed mesentery had an excellent pulse right at the planned area of resection.  Passed a prolene Mellody Dance needle.  Transected rectosigmoid off.  Got healthy bleeding mucosa.  The inspection and placed 2-0 silk sutures as belt loops around the pursestring suture.  I chose a 31 EEA stapler given her dilated rectum and colon.  Brought the anvil and tied the pursestring down.  Noted that the anvil and come down to the sacral promontory with mild tension only.  Next we returned everything in the abdomen reposition the patient.  Did laparoscopic inspection to confirm good hemostasis and ureters and small bowel without injury or concern.  Dr. Maisie Fus scrubbed down and brought up 31 EEA stapler transanally up to the end of the rectal stump.  Brought on the spike.  I attached the anvil descending colon to that spike.  Made sure the tails of the preloaded Prolene sacral promontory sutures were out of the way.  Tightened down.  Held in clinic for 60 seconds.  Fired.  Held out for 30 seconds and then released.  She got 2 excellent anastomotic rings.  Then she did flexible sigmoidoscopy transanally while I clamped the colon proximal anastomosis.  She did insufflation with pelvic irrigation for a negative air leak test.  He anastomosis looked healthy and viable.  Mildly stenotic.  We redocked the robot.  I used those sacral promontory sutures and came through the colorectal anastomosis and tied it down for right and left posterior rectopexy's.  Then used V-Loc's on the left rectal and posterior pelvis peritoneum and close that down down tighten it down sparing the ureter.  Did not seem in the right side.  Passed a drain from right sided robotic port site into the retrocolonic/retrorectal presacral space to have good drainage posteriorly.  Allowed some redundancy coming to the anterior pelvis.  No.  Was evacuated.  Ports and instruments removed.   Patient was redraped with and we redid gown and gloves per SSI prevention protocol.  I closed the Pfannenstiel incision with 0 Vicryl suture in the posterior rectus fascia vertically and #1 PDS on the anterior to fascia transversely to good result.  Because she was extremely thin and closed the 8 mm robotic port sites with 0 Vicryl suture as well to minimize her risk of hernia recurrence.  Sterile dressings applied.  Patient extubated in recovery room.  Should be able go to the floor.  We will continue to ERAS pathway.  Keep the drain for a few days and most likely remove that on the day of discharge.  I will have physical Occupational Therapy to help evaluate her since she is concerned about going home.  I discussed operative findings, updated the patient's status, discussed probable steps to recovery, and gave postoperative recommendations to the patient's son, Kendall Pipp .  Recommendations were made.  Questions were answered.  He expressed understanding & appreciation.  He notes he will be able to help take care of her.  Hopefully she go home this weekend if she is feeling independent.  We will see.  Ardeth Sportsman, MD, FACS, MASCRS Esophageal, Gastrointestinal & Colorectal Surgery Robotic and Minimally Invasive Surgery  [facility] [address] [place], [place] [address] [phone] Fax [phone] Main  CONTACT INFORMATION: Weekday (9AM-5PM): Call [facility] main office at [phone] Weeknight (5PM-9AM) or Weekend/Holiday: Check EPIC "Web Links" tab & use "AMION" (password " [email or web address]") for General Surgery [facility] coverage  Please, DO NOT use SecureChat  (it is not reliable communication to reach operating surgeons & will lead to a delay in care).   Epic staff messaging available for outptient concerns needing 1-2 business day response.

## 2023-09-05 ENCOUNTER — Encounter (HOSPITAL_COMMUNITY): Payer: Self-pay | Admitting: Surgery

## 2023-09-05 LAB — BASIC METABOLIC PANEL
Anion gap: 7 (ref 5–15)
Anion gap: 6 (ref 5–15)
BUN: 29 mg/dL — ABNORMAL HIGH (ref 8–23)
BUN: 31 mg/dL — ABNORMAL HIGH (ref 8–23)
CO2: 26 mmol/L (ref 22–32)
CO2: 26 mmol/L (ref 22–32)
Calcium: 6.8 mg/dL — ABNORMAL LOW (ref 8.9–10.3)
Calcium: 7.3 mg/dL — ABNORMAL LOW (ref 8.9–10.3)
Chloride: 103 mmol/L (ref 98–111)
Chloride: 103 mmol/L (ref 98–111)
Creatinine, Ser: 1.54 mg/dL — ABNORMAL HIGH (ref 0.44–1.00)
Creatinine, Ser: 1.78 mg/dL — ABNORMAL HIGH (ref 0.44–1.00)
GFR, Estimated: 32 mL/min — ABNORMAL LOW (ref >60)
GFR, Estimated: 27 mL/min — ABNORMAL LOW (ref >60)
Glucose, Bld: 124 mg/dL — ABNORMAL HIGH (ref 70–99)
Glucose, Bld: 125 mg/dL — ABNORMAL HIGH (ref 70–99)
Potassium: 3.8 mmol/L (ref 3.5–5.1)
Potassium: 4.3 mmol/L (ref 3.5–5.1)
Sodium: 136 mmol/L (ref 135–145)
Sodium: 135 mmol/L (ref 135–145)

## 2023-09-05 LAB — CBC WITH DIFFERENTIAL/PLATELET
Abs Immature Granulocytes: 0.04 10*3/uL (ref 0.00–0.07)
Basophils Absolute: 0 10*3/uL (ref 0.0–0.1)
Basophils Relative: 0 %
Eosinophils Absolute: 0 10*3/uL (ref 0.0–0.5)
Eosinophils Relative: 0 %
HCT: 33.3 % — ABNORMAL LOW (ref 36.0–46.0)
Hemoglobin: 10.2 g/dL — ABNORMAL LOW (ref 12.0–15.0)
Immature Granulocytes: 1 %
Lymphocytes Relative: 10 %
Lymphs Abs: 0.8 10*3/uL (ref 0.7–4.0)
MCH: 31.4 pg (ref 26.0–34.0)
MCHC: 30.6 g/dL (ref 30.0–36.0)
MCV: 102.5 fL — ABNORMAL HIGH (ref 80.0–100.0)
Monocytes Absolute: 0.9 10*3/uL (ref 0.1–1.0)
Monocytes Relative: 11 %
Neutro Abs: 6.5 10*3/uL (ref 1.7–7.7)
Neutrophils Relative %: 78 %
Platelets: 215 10*3/uL (ref 150–400)
RBC: 3.25 MIL/uL — ABNORMAL LOW (ref 3.87–5.11)
RDW: 15.9 % — ABNORMAL HIGH (ref 11.5–15.5)
WBC: 8.2 10*3/uL (ref 4.0–10.5)
nRBC: 0 % (ref 0.0–0.2)

## 2023-09-05 LAB — MAGNESIUM: Magnesium: 2 mg/dL (ref 1.7–2.4)

## 2023-09-05 LAB — CBC
HCT: 34.6 % — ABNORMAL LOW (ref 36.0–46.0)
Hemoglobin: 10.6 g/dL — ABNORMAL LOW (ref 12.0–15.0)
MCH: 31 pg (ref 26.0–34.0)
MCHC: 30.6 g/dL (ref 30.0–36.0)
MCV: 101.2 fL — ABNORMAL HIGH (ref 80.0–100.0)
Platelets: 222 10*3/uL (ref 150–400)
RBC: 3.42 MIL/uL — ABNORMAL LOW (ref 3.87–5.11)
RDW: 15.8 % — ABNORMAL HIGH (ref 11.5–15.5)
WBC: 10.3 10*3/uL (ref 4.0–10.5)
nRBC: 0 % (ref 0.0–0.2)

## 2023-09-05 MED ORDER — KCL IN DEXTROSE-NACL 20-5-0.45 MEQ/L-%-% IV SOLN
INTRAVENOUS | Status: AC
  Filled 2023-09-05 (×3): qty 1000

## 2023-09-05 MED ORDER — ENOXAPARIN SODIUM 30 MG/0.3ML IJ SOSY
30.0000 mg | PREFILLED_SYRINGE | INTRAMUSCULAR | Status: DC
  Administered 2023-09-06 – 2023-09-17 (×12): 30 mg via SUBCUTANEOUS
  Filled 2023-09-05 (×12): qty 0.3

## 2023-09-05 MED ORDER — ALBUMIN HUMAN 5 % IV SOLN
25.0000 g | Freq: Once | INTRAVENOUS | Status: AC
  Administered 2023-09-05: 25 g via INTRAVENOUS
  Filled 2023-09-05: qty 500

## 2023-09-05 NOTE — Evaluation (Signed)
Occupational Therapy Evaluation Patient Details Name: Alexa Pham MRN: [id number] DOB: 1930/04/23 Today's Date: 09/05/2023   History of Present Illness Pt is a 89 y/o female presenting on 11/27 with rectal prolapse s/p surgery 11/27. PMH includes: HTN, breast cancer s/p  partial mastectomy L side, macular degeneration, CHF, MI.   Clinical Impression   PTA patient independent and driving. Reports she has a son nearby who can provide intermittent support, and she has an housekeeper/companion 4 days/week for 2 hours a day.  Admitted for above and presents with problem list below.  Limited session due to hypotension, dizziness with upright position and generalized fatigue, but noted improved BP from this am (after initiation of bolus).  She requires setup to mod assist for ADLs, and min assist to reposition in recliner.  Educated on splinting abdomen with pillow (which was provided) to decrease discomfort with coughing.  Pt will need to progress mobility and ADLs in order to dc home, but anticipate pt will progress well once BP improves. Based on performance today, will follow acutely with recommendations for HHOT at dc.       If plan is discharge home, recommend the following: A lot of help with bathing/dressing/bathroom;A little help with walking and/or transfers;Assistance with cooking/housework    Functional Status Assessment  Patient has had a recent decline in their functional status and demonstrates the ability to make significant improvements in function in a reasonable and predictable amount of time.  Equipment Recommendations  BSC/3in1    Recommendations for Other Services       Precautions / Restrictions Precautions Precautions: Fall Precaution Comments: abdominal precautions, jp drain Restrictions Weight Bearing Restrictions: No      Mobility Bed Mobility               General bed mobility comments: OOB in recliner upon entry    Transfers                    General transfer comment: deferred due to symptomatic hypotension, but pt able to reposition and scoot in recliner with min assist      Balance                                           ADL either performed or assessed with clinical judgement   ADL Overall ADL's : Needs assistance/impaired     Grooming: Set up;Sitting           Upper Body Dressing : Set up;Sitting   Lower Body Dressing: Moderate assistance;Sitting/lateral leans     Toilet Transfer Details (indicate cue type and reason): deferred due to fatigue and hypotension         Functional mobility during ADLs: Minimal assistance General ADL Comments: pt limited by fatigue, hypotension and abdominal discomfort     Vision Baseline Vision/History: 6 Macular Degeneration Ability to See in Adequate Light: 0 Adequate Patient Visual Report: No change from baseline Vision Assessment?: No apparent visual deficits     Perception         Praxis         Pertinent Vitals/Pain Pain Assessment Pain Assessment: Faces Faces Pain Scale: Hurts even more Pain Location: abdomen Pain Descriptors / Indicators: Discomfort, Operative site guarding Pain Intervention(s): Limited activity within patient's tolerance, Monitored during session, Repositioned, Other (comment) (splinting when coughing)     Extremity/Trunk Assessment Upper Extremity Assessment Upper Extremity  Assessment: Overall WFL for tasks assessed   Lower Extremity Assessment Lower Extremity Assessment: Defer to PT evaluation   Cervical / Trunk Assessment Cervical / Trunk Assessment:  (abdominal surgery)   Communication Communication Communication: Hearing impairment   Cognition Arousal: Alert Behavior During Therapy: WFL for tasks assessed/performed Overall Cognitive Status: Within Functional Limits for tasks assessed                                       General Comments  RN reports hypotension, pt recieving  bolus; BP assessed in recliner at 101/45 but pt remains symptomatic when upright in chair. Dressing and drain intact.    Exercises     Shoulder Instructions      Home Living Family/patient expects to be discharged to:: Private residence Living Arrangements: Alone Available Help at Discharge: Family;Personal care attendant;Available PRN/intermittently Type of Home: House Home Access: Stairs to enter [employer] of Steps: 8, but has a chair lift   Home Layout: One level     Bathroom Shower/Tub: Producer, television/film/video: Standard     Home Equipment: Grab bars - tub/shower;Rollator (4 wheels)          Prior Functioning/Environment Prior Level of Function : Independent/Modified Independent;Driving               ADLs Comments: reports independent ADLs, light iADLs but has a housekeeper who comes 2hrs/day M,W,F,Sat        OT Problem List: Decreased strength;Decreased activity tolerance;Pain;Decreased knowledge of precautions;Decreased knowledge of use of DME or AE      OT Treatment/Interventions: Self-care/ADL training;DME and/or AE instruction;Patient/family education;Therapeutic activities;Therapeutic exercise;Energy conservation;Balance training    OT Goals(Current goals can be found in the care plan section) Acute Rehab OT Goals Patient Stated Goal: home OT Goal Formulation: With patient Time For Goal Achievement: 09/19/23 Potential to Achieve Goals: Good  OT Frequency: Min 1X/week    Co-evaluation              AM-PAC OT "6 Clicks" Daily Activity     Outcome Measure Help from another person eating meals?: None Help from another person taking care of personal grooming?: A Little Help from another person toileting, which includes using toliet, bedpan, or urinal?: A Lot Help from another person bathing (including washing, rinsing, drying)?: A Lot Help from another person to put on and taking off regular upper body clothing?: A  Little Help from another person to put on and taking off regular lower body clothing?: A Lot 6 Click Score: 16   End of Session Nurse Communication: Mobility status;Other (comment) (BP)  Activity Tolerance: Patient limited by fatigue;Other (comment) (hypotension) Patient left: in chair;with chair alarm set;with call bell/phone within reach  OT Visit Diagnosis: Other abnormalities of gait and mobility (R26.89);Muscle weakness (generalized) (M62.81);Pain Pain - part of body:  (abdomnen)                Time: 1610-9604 OT Time Calculation (min): 22 min Charges:  OT General Charges $OT Visit: 1 Visit OT Evaluation $OT Eval Moderate Complexity: 1 Mod  Barry Brunner, OT Acute Rehabilitation Services Office [phone]   Chancy Milroy 09/05/2023, 11:30 AM

## 2023-09-05 NOTE — Progress Notes (Signed)
PT Cancellation Note  Patient Details Name: Alexa Pham MRN: [id number] DOB: 31-May-1930   Cancelled Treatment:     PT order received but eval deferred.  OT advises pt hypotensive during limited treatment and will be receiving bolus of fluids.  Will follow.   Itzel Lowrimore 09/05/2023, 4:03 PM

## 2023-09-05 NOTE — Progress Notes (Signed)
Pt has been instructed on the use of the incentive spirometer. Encouraged her to use every hour when she is awake- 10 times. She is able to get the incentive spirometer raised to 250. She has used it 7 times. Continue to encourage use.  Urinary output with very little oral intake due to her being sleepy most of shift. Infusing LR bolus- standing order at 56ml/hr. HOB elevated. Pt is in bed resting, warm, dry, no visible distress. No c/o pain. Ice pack to lower abdomen.

## 2023-09-06 DIAGNOSIS — D539 Nutritional anemia, unspecified: Secondary | ICD-10-CM | POA: Diagnosis present

## 2023-09-06 DIAGNOSIS — K623 Rectal prolapse: Secondary | ICD-10-CM

## 2023-09-06 DIAGNOSIS — I959 Hypotension, unspecified: Secondary | ICD-10-CM | POA: Diagnosis present

## 2023-09-06 DIAGNOSIS — E440 Moderate protein-calorie malnutrition: Secondary | ICD-10-CM | POA: Diagnosis present

## 2023-09-06 DIAGNOSIS — N179 Acute kidney failure, unspecified: Secondary | ICD-10-CM | POA: Diagnosis present

## 2023-09-06 LAB — BASIC METABOLIC PANEL
Anion gap: 7 (ref 5–15)
BUN: 29 mg/dL — ABNORMAL HIGH (ref 8–23)
CO2: 22 mmol/L (ref 22–32)
Calcium: 7.8 mg/dL — ABNORMAL LOW (ref 8.9–10.3)
Chloride: 105 mmol/L (ref 98–111)
Creatinine, Ser: 1.68 mg/dL — ABNORMAL HIGH (ref 0.44–1.00)
GFR, Estimated: 29 mL/min — ABNORMAL LOW (ref >60)
Glucose, Bld: 106 mg/dL — ABNORMAL HIGH (ref 70–99)
Potassium: 4.9 mmol/L (ref 3.5–5.1)
Sodium: 134 mmol/L — ABNORMAL LOW (ref 135–145)

## 2023-09-06 LAB — HEPATIC FUNCTION PANEL
ALT: 25 U/L (ref 0–44)
AST: 42 U/L — ABNORMAL HIGH (ref 15–41)
Albumin: 2.8 g/dL — ABNORMAL LOW (ref 3.5–5.0)
Alkaline Phosphatase: 47 U/L (ref 38–126)
Bilirubin, Direct: 0.2 mg/dL (ref 0.0–0.2)
Indirect Bilirubin: 0.4 mg/dL (ref 0.3–0.9)
Total Bilirubin: 0.6 mg/dL (ref <1.2)
Total Protein: 5.5 g/dL — ABNORMAL LOW (ref 6.5–8.1)

## 2023-09-06 LAB — FOLATE: Folate: 18.9 ng/mL (ref >5.9)

## 2023-09-06 LAB — VITAMIN B12: Vitamin B-12: 753 pg/mL (ref 180–914)

## 2023-09-06 MED ORDER — SODIUM CHLORIDE 0.9 % IV BOLUS
500.0000 mL | Freq: Once | INTRAVENOUS | Status: AC
  Administered 2023-09-06: 500 mL via INTRAVENOUS

## 2023-09-06 MED ORDER — ALBUMIN HUMAN 25 % IV SOLN
25.0000 g | Freq: Once | INTRAVENOUS | Status: AC
  Administered 2023-09-06: 25 g via INTRAVENOUS
  Filled 2023-09-06: qty 100

## 2023-09-06 MED ORDER — CHLORHEXIDINE GLUCONATE CLOTH 2 % EX PADS
6.0000 | MEDICATED_PAD | Freq: Every day | CUTANEOUS | Status: DC
  Administered 2023-09-06 – 2023-09-09 (×4): 6 via TOPICAL

## 2023-09-06 MED ORDER — SODIUM CHLORIDE 0.45 % IV SOLN: INTRAVENOUS | Status: DC

## 2023-09-06 NOTE — Progress Notes (Signed)
2 days post op  Subjective/Chief Complaint: Patient had continued soft blood pressures and creatinine continued to go up.  The patient had no spontaneous voiding after Foley removal.  Foley catheter was replaced with some clear yellow urine.  Patient having flatus   Objective: Vital signs in last 24 hours: Temp:  [97.7 F (36.5 C)-98.4 F (36.9 C)] 98.4 F (36.9 C) (11/29 0516) Pulse Rate:  [66-102] 69 (11/29 0516) Resp:  [16-18] 18 (11/29 0516) BP: (92-135)/(45-53) 135/53 (11/29 0516) SpO2:  [90 %-100 %] 96 % (11/29 0623) Weight:  [58.9 kg] 58.9 kg (11/29 0500) Last BM Date : 09/04/23  Intake/Output from previous day: 11/28 0701 - 11/29 0700 In: 3333.4 [P.O.:1260; I.V.:2073.4] Out: 1032.5 [Urine:550; Drains:482.5] Intake/Output this shift: Total I/O In: -  Out: 60 [Drains:60]  OOB in chair General appearance: alert, cooperative, and no distress Resp: breathing comfortably GI: soft, sl protuberant lower abd. Dressings c/d/I. Drain serosang Extremities: extremities normal, atraumatic, no cyanosis or edema  Lab Results:  Recent Labs    09/05/23 0452 09/05/23 1900  WBC 10.3 8.2  HGB 10.6* 10.2*  HCT 34.6* 33.3*  PLT 222 215   BMET Recent Labs    09/05/23 1900 09/06/23 0818  NA 135 134*  K 4.3 4.9  CL 103 105  CO2 26 22  GLUCOSE 125* 106*  BUN 31* 29*  CREATININE 1.78* 1.68*  CALCIUM 7.3* 7.8*   PT/INR No results for input(s): "LABPROT", "INR" in the last 72 hours. ABG No results for input(s): "PHART", "HCO3" in the last 72 hours.  Invalid input(s): "PCO2", "PO2"  Studies/Results: No results found.  Anti-infectives: Anti-infectives (From admission, onward)    Start     Dose/Rate Route Frequency Ordered Stop   09/04/23 2000  cefoTEtan (CEFOTAN) 2 g in sodium chloride 0.9 % 100 mL IVPB        2 g 200 mL/hr over 30 Minutes Intravenous Every 12 hours 09/04/23 1413 09/04/23 2119   09/04/23 0700  cefoTEtan (CEFOTAN) 2 g in sodium chloride 0.9 % 100  mL IVPB        2 g 200 mL/hr over 30 Minutes Intravenous On call to O.R. 09/04/23 1610 09/04/23 0851       Assessment/Plan: s/p Procedure(s): ROBOTIC RECTOPEXY, RESECTION OF RECTOSIGMOID COLON (N/A) FLEXIBLE SIGMOIDOSCOPY (N/A)  Cr increased likely due to AKI from hypotension Consulting [facility] for assistance with blood pressure and acute kidney injury.  Mild ABL anemia  Pain control with tylenol, PRN tramadol Lovenox for VTE ppx.  Pulmonary toilet PT and OT to assist with discharge planning Advance diet to soft.   LOS: 2 days    Almond Lint 09/06/2023

## 2023-09-06 NOTE — Consult Note (Signed)
Initial Consultation Note   Patient: Alexa Pham PPI:[id number] DOB: 1930/01/21 PCP: Dale [place], MD DOA: 09/04/2023 DOS: the patient was seen and examined on 09/06/2023 Primary service: Karie Soda, MD Referring physician: Almond Lint, MD. Reason for consult: AKI and hypotension.  Assessment and Plan: Principal Problem:   Complete rectal prolapse   SUI (stress urinary incontinence, female)   Urge incontinence   Rectal prolapse Postop day #2. Continue postop care per CCS team.  Active Problems:   AKI (acute kidney injury) (HCC). Continue IV fluids. Hold ARB/ACE. Avoid hypotension. Avoid nephrotoxins. Monitor intake and output. Monitor renal function electrolytes.    Hypertension Currently with:   Hypotension Hold amlodipine/irbesartan. Increased BP parameters for carvedilol use. Continue IV fluids and boluses as needed.    Hypothyroidism Continue levothyroxine 150 mcg p.o. daily.    Hypercholesterolemia/aortic atherosclerosis Continue atorvastatin 40 mg p.o. daily.    CAD (coronary artery disease) With history of:   History of non-ST elevation myocardial infarction (NSTEMI)   History of coronary artery stent placement 2021 Continue ASA, statin and carvedilol. General surgery to decide when to resume clopidogrel.    Chronic anticoagulation Resume DAPT per CCS team.    Asthma Supplemental oxygen and nebulizers as needed.    Macrocytic anemia Check B12 and folic acid level.    Hypocalcemia Check LFTs. Correct calcium as needed. Further workup or repeat level in AM depending on results.   TRH will continue to follow the patient.  HPI: Alexa Pham is a 89 y.o. female with past medical history of actinic keratosis, multiple atypical moles, multiple basal and squamous cell carcinoma lesions, osteoarthritis, asthma, abdominal aortic atherosclerosis, left breast cancer, bronchiectasis, CHF, chronic anticoagulation, CAD, history of pneumonia, history of  herpes zoster, thyroidectomy, postoperative hypothyroidism, hyperlipidemia, hypertension, hypomagnesemia, macular degeneration history of pneumonia, pulmonary hypertension, rectal prolapse, left renal mass, or urgent continence who 2 days ago underwent a robotic low anterior rectosigmoid resection with anastomosis, robotic rectal plexi, flexible sigmoidoscopy and bilateral transversus abdominis plane block with Dr. Karie Soda 2 days ago who we are seeing due to hypotension and AKI.  She has some postop lower abdominal pain of 1 asserting.  No fever, chills or night sweats. No sore throat, rhinorrhea or hemoptysis.  Mildly dyspneic with exertion.  No chest pain, palpitations, diaphoresis, PND, orthopnea or recent pitting edema of the lower extremities. No flank pain, dysuria, frequency or hematuria.  No polyuria, polydipsia, polyphagia or blurred vision.  Review of Systems: As mentioned in the history of present illness. All other systems reviewed and are negative. Past Medical History:  Diagnosis Date   Actinic keratosis 01/28/2020   R thumb proximal nail fold    Arthritis    Asthma    Atherosclerosis of abdominal aorta (HCC)    Atypical mole 05/13/2018   R distal lat thigh   Atypical mole 09/14/2014   left ant mid thigh   Atypical mole 01/19/2014   left paraspinal mid to upper back   Atypical mole 01/19/2014   right back mid to inf scapula   Atypical mole 01/19/2014   left posterior deltoid medial   Basal cell carcinoma 01/28/2020   left of midline mid dorsum nose   Basal cell carcinoma 01/06/2018   left preauricular    Basal cell carcinoma 11/22/2016   left upper lip   Basal cell carcinoma 03/17/2015   left lateral elbow   Basal cell carcinoma 07/22/2014   R medial inferior knee   Basal cell carcinoma 07/21/2020   Left dorsum  hand. Nodular pattern. Excised 08/09/2020, margins free.   Basal cell carcinoma 02/23/2021   right dorsal hand (BCC/SCC), left dorsal hand, right chin -  treated with EDC   Breast cancer (HCC)    Left breast lumpectomy with radiation   Bronchiectasis (HCC)    CHF (congestive heart failure) (HCC)    Chronic anticoagulation    Coronary artery disease    Dyspnea on exertion    Family history of adverse reaction to anesthesia    son naseau   Family history of breast cancer    H/O partial mastectomy    LEFT   History of breast cancer    History of coronary artery stent placement 2021 09/04/2023   History of pneumonia    History of shingles    History of thyroidectomy, total    Hyperlipidemia    Hypertension    Hypomagnesemia    Hypothyroidism    Macular degeneration    Myocardial infarction [facility])    NSTEMI (non-ST elevated myocardial infarction) (HCC) 02/13/2022   Pneumonia    Pulmonary hypertension (HCC)    Rectal prolapse    Renal mass, left    Right ventricular enlargement    Squamous cell carcinoma of skin 01/06/2018   right dorsum hand at ring finger MCP   Squamous cell carcinoma of skin 07/08/2017   left distal dorsum forearm    SUI (stress urinary incontinence, female)    Urge incontinence    Past Surgical History:  Procedure Laterality Date   ABDOMINAL HYSTERECTOMY     ABDOMINAL HYSTERECTOMY     APPENDECTOMY     BREAST LUMPECTOMY     CATARACT EXTRACTION, BILATERAL     CHOLECYSTECTOMY     COLONOSCOPY WITH PROPOFOL N/A 09/03/2023   Procedure: COLONOSCOPY WITH PROPOFOL;  Surgeon: Regis Bill, MD;  Location: [facility] ENDOSCOPY;  Service: Endoscopy;  Laterality: N/A;   CORONARY STENT INTERVENTION N/A 07/26/2020   Procedure: CORONARY STENT INTERVENTION;  Surgeon: Marcina Millard, MD;  Location: [facility] INVASIVE CV LAB;  Service: Cardiovascular;  Laterality: N/A;   FLEXIBLE SIGMOIDOSCOPY N/A 09/04/2023   Procedure: FLEXIBLE SIGMOIDOSCOPY;  Surgeon: Karie Soda, MD;  Location: [facility] ORS;  Service: General;  Laterality: N/A;   LEFT HEART CATH AND CORONARY ANGIOGRAPHY Left 07/26/2020   Procedure: LEFT HEART CATH AND  CORONARY ANGIOGRAPHY;  Surgeon: Lamar Blinks, MD;  Location: [facility] INVASIVE CV LAB;  Service: Cardiovascular;  Laterality: Left;   REPLACEMENT TOTAL KNEE Left    TONSILLECTOMY     TONSILLECTOMY AND ADENOIDECTOMY     Social History:  reports that she quit smoking about 49 years ago. Her smoking use included cigarettes. She started smoking about 55 years ago. She has a 9 pack-year smoking history. She has never been exposed to tobacco smoke. She has never used smokeless tobacco. She reports current alcohol use. She reports that she does not use drugs.  Allergies  Allergen Reactions   Ticagrelor Shortness Of Breath    Extreme    Oxycodone Nausea Only    Family History  Problem Relation Age of Onset   CAD Mother    Cancer Maternal Aunt        unk type   Leukemia Maternal Grandmother    Breast cancer Half-Sister        dx 37s    Prior to Admission medications   Medication Sig Start Date End Date Taking? Authorizing Provider  albuterol (PROVENTIL) (2.5 MG/3ML) 0.083% nebulizer solution Take 2.5 mg by nebulization 2 (two) times daily. 06/01/23  Yes [provider]  amLODipine (NORVASC) 2.5 MG tablet Take 2.5 mg by mouth 2 (two) times daily. 02/20/23  Yes [provider]  aspirin EC 81 MG EC tablet Take 1 tablet (81 mg total) by mouth daily. Swallow whole. 02/16/22  Yes Amin, Ankit C, MD  atorvastatin (LIPITOR) 40 MG tablet TAKE 1 TABLET(40 MG) BY MOUTH DAILY 04/04/23  Yes Dale [place], MD  calcium citrate-vitamin D (CITRACAL+D) 315-200 MG-UNIT tablet Take 1 tablet by mouth 2 (two) times daily.   Yes [provider]  carvedilol (COREG) 6.25 MG tablet Take 6.25 mg by mouth 2 (two) times daily with a meal. 10/10/22 10/10/23 Yes [provider]  clopidogrel (PLAVIX) 75 MG tablet Take 1 tablet (75 mg total) by mouth daily. 04/04/23  Yes Dale [place], MD  Fluticasone-Umeclidin-Vilant (TRELEGY ELLIPTA) 100-62.5-25 MCG/ACT AEPB Inhale 1 puff into the lungs  daily. 01/21/23  Yes Salena Saner, MD  furosemide (LASIX) 20 MG tablet Take by mouth. 02/20/23  Yes [provider]  irbesartan (AVAPRO) 150 MG tablet Take 1 tablet (150 mg total) by mouth daily. Patient taking differently: Take 150 mg by mouth in the morning and at bedtime. 12/14/22 12/14/23 Yes Dale [place], MD  levothyroxine (SYNTHROID) 150 MCG tablet Take 1 tablet (150 mcg total) by mouth daily. 04/04/23  Yes Dale [place], MD  Multiple Vitamins-Minerals (PRESERVISION AREDS 2 PO) Take 1 tablet by mouth 2 (two) times daily.    Yes [provider]  potassium chloride (KLOR-CON) 10 MEQ tablet TAKE 1 TABLET(10 MEQ) BY MOUTH DAILY Patient taking differently: Take 10 mEq by mouth daily. 08/16/23  Yes Dale [place], MD  sodium chloride HYPERTONIC 3 % nebulizer solution Take 4 mLs by nebulization in the morning and at bedtime. 07/12/23  Yes [provider]  traMADol (ULTRAM) 50 MG tablet Take 1-2 tablets (50-100 mg total) by mouth every 6 (six) hours as needed for moderate pain (pain score 4-6) or severe pain (pain score 7-10). 09/04/23  Loletta Specter, MD    Physical Exam: Vitals:   09/05/23 2123 09/06/23 0500 09/06/23 0516 09/06/23 0623  BP: (!) 101/49  (!) 135/53   Pulse: 66  69   Resp: 16  18   Temp: 98 F (36.7 C)  98.4 F (36.9 C)   TempSrc: Oral     SpO2: 100%  100% 96%  Weight:  58.9 kg    Height:       Physical Exam Vitals and nursing note reviewed.  Constitutional:      General: She is awake. She is not in acute distress.    Appearance: Normal appearance. She is normal weight. She is ill-appearing.  HENT:     Head: Normocephalic.     Nose: No rhinorrhea.     Mouth/Throat:     Mouth: Mucous membranes are dry.  Eyes:     General: No scleral icterus.    Pupils: Pupils are equal, round, and reactive to light.  Cardiovascular:     Rate and Rhythm: Normal rate and regular rhythm.  Pulmonary:     Effort: Pulmonary effort is normal.      Breath sounds: Normal breath sounds. No wheezing, rhonchi or rales.  Abdominal:     Palpations: Abdomen is soft.     Tenderness: There is abdominal tenderness. There is no guarding or rebound.  Musculoskeletal:     Cervical back: Neck supple.     Right lower leg: No edema.     Left lower leg: No  edema.  Skin:    General: Skin is warm and dry.  Neurological:     General: No focal deficit present.     Mental Status: She is alert and oriented to person, place, and time.  Psychiatric:        Mood and Affect: Mood normal.        Behavior: Behavior normal. Behavior is cooperative.     Data Reviewed:   Results are pending, will review when available.   Family Communication:  Primary team communication:  Thank you very much for involving [place] in the care of your patient.  Author: Bobette Mo, MD 09/06/2023 9:27 AM  For on call review [email or web address].   This document was prepared using Dragon voice recognition software and may contain some unintended transcription errors.

## 2023-09-06 NOTE — Progress Notes (Signed)
**  DELAYED ENTRY**  1 day post op  Subjective/Chief Complaint: Some pain in tailbone. Passing some gas.  Had some soft BPs and got 2 boluses already.    Objective: Vital signs in last 24 hours: Temp:  [97.7 F (36.5 C)-98.4 F (36.9 C)] 98.4 F (36.9 C) (11/29 0516) Pulse Rate:  [66-102] 69 (11/29 0516) Resp:  [16-18] 18 (11/29 0516) BP: (92-135)/(45-53) 135/53 (11/29 0516) SpO2:  [90 %-100 %] 96 % (11/29 0623) Weight:  [58.9 kg] 58.9 kg (11/29 0500) Last BM Date : 09/04/23  Intake/Output from previous day: 11/28 0701 - 11/29 0700 In: 3333.4 [P.O.:1260; I.V.:2073.4] Out: 1032.5 [Urine:550; Drains:482.5] Intake/Output this shift: Total I/O In: -  Out: 60 [Drains:60]  OOB in chair General appearance: alert, cooperative, and no distress Resp: breathing comfortably GI: soft, sl protuberant lower abd. Dressings c/d/I. Drain serosang Extremities: extremities normal, atraumatic, no cyanosis or edema  Lab Results:  Recent Labs    09/05/23 0452 09/05/23 1900  WBC 10.3 8.2  HGB 10.6* 10.2*  HCT 34.6* 33.3*  PLT 222 215   BMET Recent Labs    09/05/23 1900 09/06/23 0818  NA 135 134*  K 4.3 4.9  CL 103 105  CO2 26 22  GLUCOSE 125* 106*  BUN 31* 29*  CREATININE 1.78* 1.68*  CALCIUM 7.3* 7.8*   PT/INR No results for input(s): "LABPROT", "INR" in the last 72 hours. ABG No results for input(s): "PHART", "HCO3" in the last 72 hours.  Invalid input(s): "PCO2", "PO2"  Studies/Results: No results found.  Anti-infectives: Anti-infectives (From admission, onward)    Start     Dose/Rate Route Frequency Ordered Stop   09/04/23 2000  cefoTEtan (CEFOTAN) 2 g in sodium chloride 0.9 % 100 mL IVPB        2 g 200 mL/hr over 30 Minutes Intravenous Every 12 hours 09/04/23 1413 09/04/23 2119   09/04/23 0700  cefoTEtan (CEFOTAN) 2 g in sodium chloride 0.9 % 100 mL IVPB        2 g 200 mL/hr over 30 Minutes Intravenous On call to O.R. 09/04/23 8469 09/04/23 0851        Assessment/Plan: s/p Procedure(s): ROBOTIC RECTOPEXY, RESECTION OF RECTOSIGMOID COLON (N/A) FLEXIBLE SIGMOIDOSCOPY (N/A) Foley removed 6 AM  Coreg and norvasc on hold Cr increased likely due to AKI from hypotension Mild ABL anemia  Pain control with tylenol, PRN tramadol Lovenox for VTE ppx.    LOS: 2 days    Alexa Pham 09/06/2023

## 2023-09-06 NOTE — Evaluation (Signed)
Physical Therapy Evaluation Patient Details Name: Alexa Pham MRN: [id number] DOB: 07/22/1930 Today's Date: 09/06/2023  History of Present Illness  Pt is a 89 y/o female presenting on 11/27 with rectal prolapse s/p surgery 11/27. PMH includes: HTN, breast cancer s/p  partial mastectomy L side, macular degeneration, CHF, MI.  Clinical Impression  Pt admitted as above and presenting with functional mobility limitations 2* post op pain, balance deficits, decreased activity tolerance and c/o dizziness with mobility (BP sitting 101/58 and after step pvt to recliner 105/49) .  Pt reports limited assist at home and Patient will benefit from continued inpatient follow up therapy, <3 hours/day .      If plan is discharge home, recommend the following: A lot of help with walking and/or transfers;A little help with bathing/dressing/bathroom;Assistance with cooking/housework;Help with stairs or ramp for entrance;Assist for transportation   Can travel by private vehicle        Equipment Recommendations None recommended by PT  Recommendations for Other Services       Functional Status Assessment Patient has had a recent decline in their functional status and demonstrates the ability to make significant improvements in function in a reasonable and predictable amount of time.     Precautions / Restrictions Precautions Precautions: Fall Precaution Comments: abdominal precautions, jp drain Restrictions Weight Bearing Restrictions: No      Mobility  Bed Mobility Overal bed mobility: Needs Assistance Bed Mobility: Supine to Sit     Supine to sit: Mod assist     General bed mobility comments: cues for Log roll technique and assist to move to sitting upright    Transfers Overall transfer level: Needs assistance Equipment used: Rolling walker (2 wheels) Transfers: Sit to/from Stand, Bed to chair/wheelchair/BSC Sit to Stand: Mod assist   Step pivot transfers: Mod assist        General transfer comment: assist to bring wt up and fwd and to balance in standing; step pvt to recliner but with bowel incontinence and attempt to fall during transition    Ambulation/Gait               General Gait Details: step pvt to recliner only  Stairs            Wheelchair Mobility     Tilt Bed    Modified Rankin (Stroke Patients Only)       Balance Overall balance assessment: Needs assistance Sitting-balance support: No upper extremity supported, Feet supported Sitting balance-Leahy Scale: Fair     Standing balance support: Bilateral upper extremity supported Standing balance-Leahy Scale: Poor                               Pertinent Vitals/Pain Pain Assessment Pain Assessment: Faces Faces Pain Scale: Hurts even more Pain Location: abdomen Pain Descriptors / Indicators: Discomfort, Operative site guarding Pain Intervention(s): Limited activity within patient's tolerance, Monitored during session    Home Living Family/patient expects to be discharged to:: Unsure Living Arrangements: Alone Available Help at Discharge: Family;Personal care attendant;Available PRN/intermittently Type of Home: House Home Access: Stairs to enter   [employer] of Steps: 8, but has a chair lift   Home Layout: One level Home Equipment: Grab bars - tub/shower;Rollator (4 wheels)      Prior Function Prior Level of Function : Independent/Modified Independent;Driving               ADLs Comments: reports independent ADLs, light iADLs but has a  housekeeper who comes 2hrs/day M,W,F,Sat     Extremity/Trunk Assessment   Upper Extremity Assessment Upper Extremity Assessment: Overall WFL for tasks assessed    Lower Extremity Assessment Lower Extremity Assessment: Generalized weakness       Communication   Communication Communication: Hearing impairment Cueing Techniques: Verbal cues;Tactile cues  Cognition Arousal: Alert Behavior  During Therapy: WFL for tasks assessed/performed Overall Cognitive Status: Within Functional Limits for tasks assessed                                          General Comments      Exercises     Assessment/Plan    PT Assessment Patient needs continued PT services  PT Problem List Decreased strength;Decreased activity tolerance;Decreased balance;Decreased mobility;Decreased knowledge of use of DME;Pain       PT Treatment Interventions DME instruction;Gait training;Functional mobility training;Therapeutic activities;Therapeutic exercise;Patient/family education    PT Goals (Current goals can be found in the Care Plan section)  Acute Rehab PT Goals Patient Stated Goal: Regain IND PT Goal Formulation: With patient Time For Goal Achievement: 09/19/23 Potential to Achieve Goals: Good    Frequency Min 1X/week     Co-evaluation               AM-PAC PT "6 Clicks" Mobility  Outcome Measure Help needed turning from your back to your side while in a flat bed without using bedrails?: A Lot Help needed moving from lying on your back to sitting on the side of a flat bed without using bedrails?: A Lot Help needed moving to and from a bed to a chair (including a wheelchair)?: A Lot Help needed standing up from a chair using your arms (e.g., wheelchair or bedside chair)?: A Lot Help needed to walk in hospital room?: Total Help needed climbing 3-5 steps with a railing? : Total 6 Click Score: 10    End of Session Equipment Utilized During Treatment: Gait belt;Oxygen Activity Tolerance: Patient limited by fatigue;Other (comment) (dizziness, bowel incontinence) Patient left: in chair;with call bell/phone within reach;with chair alarm set Nurse Communication: Mobility status PT Visit Diagnosis: Unsteadiness on feet (R26.81);Difficulty in walking, not elsewhere classified (R26.2);Muscle weakness (generalized) (M62.81)    Time: 1126-1200 PT Time Calculation (min)  (ACUTE ONLY): 34 min   Charges:   PT Evaluation $PT Eval Low Complexity: 1 Low PT Treatments $Therapeutic Activity: 8-22 mins PT General Charges $$ ACUTE PT VISIT: 1 Visit         Mauro Kaufmann PT Acute Rehabilitation Services Pager [phone] Office [phone]   [facility] 09/06/2023, 12:57 PM

## 2023-09-06 NOTE — Progress Notes (Signed)
TRH consult note addendum:  The patient's BP decreased to 101/54 mmHg.  A 500 mL NS bolus was given followed by albumin 25 g IVPB and 0.45% NaCl at 62.5 mL/h for 16 hours.  We will continue to follow the blood pressure closely.  Sanda Klein, MD.

## 2023-09-06 NOTE — [place] FL2 (Signed)
[place] MEDICAID FL2 LEVEL OF CARE FORM     IDENTIFICATION  Patient Name: Alexa Pham Birthdate: 05-20-1930 Sex: female Admission Date (Current Location): 09/04/2023  [facility] and [place] Number:  Producer, television/film/video and Address:  [facility],  [address] [place], [place] [address]      Provider Number: [id number]  Attending Physician Name and Address:  Karie Soda, MD  Relative Name and Phone Number:  Shawndee, Tejero [phone]    Current Level of Care: Hospital Recommended Level of Care: Skilled Nursing Facility Prior Approval Number:    Date Approved/Denied:   PASRR Number: [id number]  Discharge Plan: SNF    Current Diagnoses: Patient Active Problem List   Diagnosis Date Noted   AKI (acute kidney injury) (HCC) 09/06/2023   Hypotension 09/06/2023   Macrocytic anemia 09/06/2023   Hypocalcemia 09/06/2023   Moderate protein malnutrition (HCC) 09/06/2023   History of non-ST elevation myocardial infarction (NSTEMI) 09/04/2023   History of coronary artery stent placement 2021 09/04/2023   Complete rectal prolapse 09/04/2023   Chronic anticoagulation    SUI (stress urinary incontinence, female)    Asthma    Nocturnal hypoxia 04/08/2023   Pulmonary hypertension (HCC) 04/07/2023   Bilateral lower extremity edema 02/20/2023   Rectal prolapse 02/04/2023   Right ventricular enlargement 01/28/2023   Urge incontinence 11/08/2022   Shortness of breath 10/04/2022   Cough 06/24/2022   Fatigue 06/03/2022   Nocturia 06/03/2022   Premature beats 05/31/2022   Osteopenia 02/18/2022   NSTEMI (non-ST elevated myocardial infarction) (HCC) 02/13/2022   Basal cell carcinoma 02/23/2021   Genetic testing 08/31/2020   Family history of breast cancer    Kidney mass 08/08/2020   CAD (coronary artery disease) 07/26/2020   Angina pectoris (HCC) 07/04/2020   Abnormal liver function test 06/15/2020   Dizziness 05/04/2020   Elevated troponin 05/04/2020    Hypomagnesemia 05/04/2020   Orthostatic hypotension 05/04/2020   Shingles 01/10/2020   Weight loss 05/10/2019   Stress 04/19/2019   Aortic atherosclerosis (HCC) 11/20/2018   Abnormal CT lung screening 11/07/2018   Bronchiectasis without complication (HCC) 11/07/2018   Abnormal chest CT 10/19/2018   Health care maintenance 07/06/2018   History of breast cancer 12/01/2017   Macular degeneration 12/01/2017   Hypertension 11/28/2017   Hypothyroidism 11/28/2017   Hypercholesterolemia 11/28/2017    Orientation RESPIRATION BLADDER Height & Weight     Self, Place  O2 (2L) Continent Weight: 129 lb 13.6 oz (58.9 kg) Height:  5\' 6"  (167.6 cm)  BEHAVIORAL SYMPTOMS/MOOD NEUROLOGICAL BOWEL NUTRITION STATUS      Incontinent Diet (See discharge summary)  AMBULATORY STATUS COMMUNICATION OF NEEDS Skin   Limited Assist Verbally Surgical wounds                       Personal Care Assistance Level of Assistance  Bathing, Feeding, Dressing Bathing Assistance: Limited assistance Feeding assistance: Independent Dressing Assistance: Limited assistance     Functional Limitations Info  Sight, Hearing, Speech Sight Info: Impaired Hearing Info: Impaired Speech Info: Adequate    SPECIAL CARE FACTORS FREQUENCY  PT (By licensed PT), OT (By licensed OT)     PT Frequency: 5x/wk OT Frequency: 5x/wk            Contractures Contractures Info: Not present    Additional Factors Info  Code Status, Allergies Code Status Info: FULL Allergies Info: Ticagrelor, Oxycodone           Current Medications (09/06/2023):  This is  the current hospital active medication list Current Facility-Administered Medications  Medication Dose Route Frequency Provider Last Rate Last Admin   0.45 % sodium chloride infusion   Intravenous Continuous Bobette Mo, MD 62 mL/hr at 09/06/23 1312 New Bag at 09/06/23 1312   0.9 %  sodium chloride infusion  250 mL Intravenous PRN Karie Soda, MD        acetaminophen (TYLENOL) tablet 1,000 mg  1,000 mg Oral TID Karie Soda, MD   1,000 mg at 09/06/23 0842   albumin human 25 % solution 25 g  25 g Intravenous Once Bobette Mo, MD       albuterol (PROVENTIL) (2.5 MG/3ML) 0.083% nebulizer solution 2.5 mg  2.5 mg Nebulization BID Karie Soda, MD   2.5 mg at 09/06/23 0622   alum & mag hydroxide-simeth (MAALOX/MYLANTA) 200-200-20 MG/5ML suspension 30 mL  30 mL Oral Q6H PRN Karie Soda, MD       aspirin EC tablet 81 mg  81 mg Oral Daily Karie Soda, MD   81 mg at 09/06/23 0842   atorvastatin (LIPITOR) tablet 40 mg  40 mg Oral Daily Karie Soda, MD   40 mg at 09/06/23 1610   bismuth subsalicylate (PEPTO BISMOL) 262 MG/15ML suspension 30 mL  30 mL Oral Q8H PRN Karie Soda, MD       calcium-vitamin D (OSCAL WITH D) 500-5 MG-MCG per tablet 1 tablet  1 tablet Oral BID Karie Soda, MD   1 tablet at 09/06/23 0842   carvedilol (COREG) tablet 6.25 mg  6.25 mg Oral BID WC Bobette Mo, MD   6.25 mg at 09/06/23 9604   Chlorhexidine Gluconate Cloth 2 % PADS 6 each  6 each Topical [id number] Karie Soda, MD   6 each at 09/06/23 0830   enoxaparin (LOVENOX) injection 30 mg  30 mg Subcutaneous Q24H Karie Soda, MD   30 mg at 09/06/23 0843   feeding supplement (ENSURE SURGERY) liquid 237 mL  237 mL Oral BID BM Karie Soda, MD   237 mL at 09/05/23 1531   gabapentin (NEURONTIN) capsule 200 mg  200 mg Oral Laurena Slimmer, MD   200 mg at 09/05/23 2140   hydrALAZINE (APRESOLINE) injection 10 mg  10 mg Intravenous Q2H PRN Karie Soda, MD       HYDROmorphone (DILAUDID) injection 0.5-2 mg  0.5-2 mg Intravenous Q4H PRN Karie Soda, MD       levothyroxine (SYNTHROID) tablet 150 mcg  150 mcg Oral Daily Karie Soda, MD   150 mcg at 09/06/23 1191   magic mouthwash  15 mL Oral QID PRN Karie Soda, MD       melatonin tablet 3 mg  3 mg Oral QHS PRN Karie Soda, MD       menthol-cetylpyridinium (CEPACOL) lozenge 3 mg  1 lozenge Oral PRN Karie Soda, MD       methocarbamol (ROBAXIN) injection 1,000 mg  1,000 mg Intravenous Q6H PRN Karie Soda, MD       methocarbamol (ROBAXIN) tablet 1,000 mg  1,000 mg Oral Q6H PRN Karie Soda, MD       metoprolol tartrate (LOPRESSOR) injection 5 mg  5 mg Intravenous Q6H PRN Karie Soda, MD       naphazoline-glycerin (CLEAR EYES REDNESS) ophth solution 1-2 drop  1-2 drop Both Eyes QID PRN Karie Soda, MD       ondansetron [facility]) tablet 4 mg  4 mg Oral Q6H PRN Karie Soda, MD       Or  ondansetron (ZOFRAN) injection 4 mg  4 mg Intravenous Q6H PRN Karie Soda, MD       phenol (CHLORASEPTIC) mouth spray 2 spray  2 spray Mouth/Throat PRN Karie Soda, MD       prochlorperazine (COMPAZINE) tablet 10 mg  10 mg Oral Q6H PRN Karie Soda, MD       Or   prochlorperazine (COMPAZINE) injection 5-10 mg  5-10 mg Intravenous Q6H PRN Karie Soda, MD       psyllium (HYDROCIL/METAMUCIL) 1 packet  1 packet Oral BID Karie Soda, MD   1 packet at 09/06/23 0843   simethicone (MYLICON) chewable tablet 40 mg  40 mg Oral Q6H PRN Karie Soda, MD       sodium chloride (OCEAN) 0.65 % nasal spray 1-2 spray  1-2 spray Each Nare Q6H PRN Karie Soda, MD       sodium chloride flush (NS) 0.9 % injection 3 mL  3 mL Intravenous Catha Gosselin, MD   3 mL at 09/06/23 0946   sodium chloride flush (NS) 0.9 % injection 3 mL  3 mL Intravenous PRN Karie Soda, MD       traMADol Janean Sark) tablet 50-100 mg  50-100 mg Oral Q6H PRN Karie Soda, MD         Discharge Medications: Please see discharge summary for a list of discharge medications.  Relevant Imaging Results:  Relevant Lab Results:   Additional Information SSN: [id number]  Otelia Santee, LCSW

## 2023-09-06 NOTE — TOC Initial Note (Signed)
Transition of Care [facility]) - Initial/Assessment Note    Patient Details  Name: Alexa Pham MRN: [id number] Date of Birth: [birth year]  Transition of Care [facility]) CM/SW Contact:    Otelia Santee, LCSW Phone Number: 09/06/2023, 1:22 PM  Clinical Narrative:                 Pt oriented x 2. Pt from home alone where she has PCS 4 days a week for 2 hours a day. Spoke with pt's son via t/c to discuss recommendation for SNF. Family agreeable to this recommendation and prefer [facility], [facility], or [facility] for SNF placement. Referrals have been faxed out for placement and currently awaiting bed offers.   Expected Discharge Plan: Skilled Nursing Facility Barriers to Discharge: Continued Medical Work up, SNF Pending bed offer   Patient Goals and CMS Choice Patient states their goals for this hospitalization and ongoing recovery are:: To go to rehab CMS Medicare.gov Compare Post Acute Care list provided to:: Patient Represenative (must comment) Choice offered to / list presented to : Adult Children [place] ownership interest in [facility]provided to:: Adult Children    Expected Discharge Plan and Services In-house Referral: Clinical Social Work Discharge Planning Services: NA Post Acute Care Choice: Skilled Nursing Facility Living arrangements for the past 2 months: Single Family Home                 DME Arranged: N/A DME Agency: NA                  Prior Living Arrangements/Services Living arrangements for the past 2 months: Single Family Home Lives with:: Self Patient language and need for interpreter reviewed:: Yes Do you feel safe going back to the place where you live?: Yes      Need for Family Participation in Patient Care: Yes (Comment) Care giver support system in place?: Yes (comment) Current home services: DME, Other (comment) (Housekeeper/companion 2hr/day 4day/wk) Criminal Activity/Legal Involvement Pertinent to Current  Situation/Hospitalization: No - Comment as needed  Activities of Daily Living   ADL Screening (condition at time of admission) Independently performs ADLs?: Yes (appropriate for developmental age) Is the patient deaf or have difficulty hearing?: Yes Does the patient have difficulty seeing, even when wearing glasses/contacts?: Yes Does the patient have difficulty concentrating, remembering, or making decisions?: No  Permission Sought/Granted Permission sought to share information with : Facility Medical sales representative, Family Supports Permission granted to share information with : Yes, Verbal Permission Granted              Emotional Assessment     Affect (typically observed): Accepting Orientation: : Fluctuating Orientation (Suspected and/or reported Sundowners) Alcohol / Substance Use: Not Applicable Psych Involvement: No (comment)  Admission diagnosis:  Complete rectal prolapse [K62.3] Patient Active Problem List   Diagnosis Date Noted   AKI (acute kidney injury) (HCC) 09/06/2023   Hypotension 09/06/2023   Macrocytic anemia 09/06/2023   Hypocalcemia 09/06/2023   Moderate protein malnutrition (HCC) 09/06/2023   History of non-ST elevation myocardial infarction (NSTEMI) 09/04/2023   History of coronary artery stent placement 2021 09/04/2023   Complete rectal prolapse 09/04/2023   Chronic anticoagulation    SUI (stress urinary incontinence, female)    Asthma    Nocturnal hypoxia 04/08/2023   Pulmonary hypertension (HCC) 04/07/2023   Bilateral lower extremity edema 02/20/2023   Rectal prolapse 02/04/2023   Right ventricular enlargement 01/28/2023   Urge incontinence 11/08/2022   Shortness of breath 10/04/2022   Cough  06/24/2022   Fatigue 06/03/2022   Nocturia 06/03/2022   Premature beats 05/31/2022   Osteopenia 02/18/2022   NSTEMI (non-ST elevated myocardial infarction) (HCC) 02/13/2022   Basal cell carcinoma 02/23/2021   Genetic testing 08/31/2020   Family  history of breast cancer    Kidney mass 08/08/2020   CAD (coronary artery disease) 07/26/2020   Angina pectoris (HCC) 07/04/2020   Abnormal liver function test 06/15/2020   Dizziness 05/04/2020   Elevated troponin 05/04/2020   Hypomagnesemia 05/04/2020   Orthostatic hypotension 05/04/2020   Shingles 01/10/2020   Weight loss 05/10/2019   Stress 04/19/2019   Aortic atherosclerosis (HCC) 11/20/2018   Abnormal CT lung screening 11/07/2018   Bronchiectasis without complication (HCC) 11/07/2018   Abnormal chest CT 10/19/2018   Health care maintenance 07/06/2018   History of breast cancer 12/01/2017   Macular degeneration 12/01/2017   Hypertension 11/28/2017   Hypothyroidism 11/28/2017   Hypercholesterolemia 11/28/2017   PCP:  Dale Pinehurst, MD Pharmacy:   [id number] [place], [place] - [address] AT [facility] [address] [facility] [address] [place] [address] Phone: [phone] Fax: [phone]     Social Determinants of Health (SDOH) Social History: SDOH Screenings   Food Insecurity: No Food Insecurity (09/04/2023)  Housing: Low Risk  (09/04/2023)  Transportation Needs: No Transportation Needs (09/04/2023)  Utilities: Not At Risk (09/04/2023)  Depression (PHQ2-9): Low Risk  (04/04/2023)  Financial Resource Strain: Low Risk  (10/22/2022)  Physical Activity: Insufficiently Active (10/22/2022)  Social Connections: Moderately Integrated (10/22/2022)  Stress: No Stress Concern Present (10/22/2022)  Tobacco Use: Medium Risk (09/04/2023)   SDOH Interventions:     Readmission Risk Interventions    09/06/2023    1:20 PM  Readmission Risk Prevention Plan  Transportation Screening Complete  PCP or Specialist Appt within 5-7 Days Complete  Home Care Screening Complete  Medication Review (RN CM) Complete

## 2023-09-07 DIAGNOSIS — K623 Rectal prolapse: Secondary | ICD-10-CM | POA: Diagnosis not present

## 2023-09-07 LAB — CBC
HCT: 35.1 % — ABNORMAL LOW (ref 36.0–46.0)
Hemoglobin: 11.1 g/dL — ABNORMAL LOW (ref 12.0–15.0)
MCH: 32 pg (ref 26.0–34.0)
MCHC: 31.6 g/dL (ref 30.0–36.0)
MCV: 101.2 fL — ABNORMAL HIGH (ref 80.0–100.0)
Platelets: 223 10*3/uL (ref 150–400)
RBC: 3.47 MIL/uL — ABNORMAL LOW (ref 3.87–5.11)
RDW: 16.1 % — ABNORMAL HIGH (ref 11.5–15.5)
WBC: 9.3 10*3/uL (ref 4.0–10.5)
nRBC: 0 % (ref 0.0–0.2)

## 2023-09-07 LAB — RENAL FUNCTION PANEL
Albumin: 2.7 g/dL — ABNORMAL LOW (ref 3.5–5.0)
Anion gap: 7 (ref 5–15)
BUN: 29 mg/dL — ABNORMAL HIGH (ref 8–23)
CO2: 23 mmol/L (ref 22–32)
Calcium: 8.6 mg/dL — ABNORMAL LOW (ref 8.9–10.3)
Chloride: 105 mmol/L (ref 98–111)
Creatinine, Ser: 1.27 mg/dL — ABNORMAL HIGH (ref 0.44–1.00)
GFR, Estimated: 40 mL/min — ABNORMAL LOW (ref >60)
Glucose, Bld: 124 mg/dL — ABNORMAL HIGH (ref 70–99)
Phosphorus: 2.7 mg/dL (ref 2.5–4.6)
Potassium: 4.6 mmol/L (ref 3.5–5.1)
Sodium: 135 mmol/L (ref 135–145)

## 2023-09-07 LAB — CREATININE, FLUID (PLEURAL, PERITONEAL, JP DRAINAGE): Creat, Fluid: 1.3 mg/dL

## 2023-09-07 MED ORDER — CARVEDILOL 3.125 MG PO TABS
3.1250 mg | ORAL_TABLET | Freq: Two times a day (BID) | ORAL | Status: DC
  Administered 2023-09-08 – 2023-09-16 (×15): 3.125 mg via ORAL
  Filled 2023-09-07 (×17): qty 1

## 2023-09-07 MED ORDER — KCL IN DEXTROSE-NACL 10-5-0.45 MEQ/L-%-% IV SOLN
INTRAVENOUS | Status: AC
  Filled 2023-09-07 (×3): qty 1000

## 2023-09-07 NOTE — Progress Notes (Signed)
[facility] Consultation Progress Note  Patient: Alexa Pham UJW:[id number]   PCP: Dale Bovill, MD DOB: Jun 09, 1930   DOA: 09/04/2023   DOS: 09/07/2023   Date of Service: the patient was seen and examined on 09/07/2023 Primary service: Karie Soda, MD   Brief hospital course: PMH of asthma, CAD, CHF, hypothyroidism, HLD, HTN, hard of hearing, pulmonary hypertension presented for robotic low anterior rectosigmoid resection with anastomosis. Medicine service was consulted for hypotension as well as AKI.  Assessment and Plan: AKI. Baseline serum creatinine 0.9. On admission serum creatinine 1.5 worsened to 1.7. Now improving to 1.27. Continue with IV hydration.  Urine output is adequate.  Less likely obstruction. Avoid hypotension.  Avoid nephrotoxic medications. For now monitor.  History of hypertension but now with hypotension. On amlodipine 2.5 mg at home as well as Coreg 6.25 mg twice daily at home with Lasix and irbesartan. Postoperatively blood pressure has remaining low. Medications are on hold. Coreg was continued for mild elevated blood pressure but due to significant fluctuation I will reduce the dose. Monitor for now.  CAD. On aspirin.  And Plavix. Currently on aspirin. Resumption of Plavix per surgery.  HLD. On Lipitor.  Hypothyroidism. On Synthroid 150 mcg. TSH 2.56 in June. Will recheck tomorrow.  Type 2 diabetes mellitus. Hemoglobin A1c 5.8.  We will continue to follow the patient.   Subjective: Hard of hearing.  Alert awake and oriented.  Abdominal pain present.  No other other complaint.  No nausea no vomiting.  Minimal oral intake.  Objective: Vitals:   09/07/23 0645 09/07/23 0648 09/07/23 1148 09/07/23 1638  BP:   (!) 112/54 120/66  Pulse:   77 78  Resp:   18   Temp:      TempSrc:      SpO2: (!) 87% 93% 98%   Weight:      Height:        General: in Mild distress, No Rash Cardiovascular: S1 and S2 Present, aortic systolic   Murmur Respiratory: Good respiratory effort, Bilateral Air entry present. No Crackles, No wheezes Abdomen: Bowel Sound present, mild surgical site tenderness Extremities: No edema Neuro: Alert and oriented x3, no new focal deficit, no asterixis.  Family Communication: No one at bedside  Data Reviewed: Since last encounter, pertinent lab results CBC and BMP   . I have ordered test including CBC BMP TSH free T4  .   Author: Lynden Oxford, MD  [facility] 09/07/2023  6:29 PM  To reach On-call, Look up on care teams to locate the [facility] team or provider name and reach out to them via secure chat or [email or web address] Between 7PM-7AM, please contact night-coverage. If you still have difficulty reaching the attending provider, please page the [facility] (Director on Call) for [facility] on amion for assistance.

## 2023-09-07 NOTE — Progress Notes (Signed)
Occupational Therapy Treatment Patient Details Name: Alexa Pham MRN: [id number] DOB: [birth year] Today's Date: 09/07/2023   History of present illness Pt is a 89 y/o female presenting on 11/27 with rectal prolapse s/p surgery 11/27. PMH includes: HTN, breast cancer s/p  partial mastectomy L side, macular degeneration, CHF, MI.   OT comments  Patient is unfortunately not progressing and showed regression as evidenced by an decreased score on 6-Clicks AM-PAC measure of occupational Performance with previous score of 16/24, and current score of 14/24.  compared to previous session.  Discharge recommendations for have updated to include skilled nursing facility in order to better meat pt's needs prior to discharge home and pt is agreeable.   Patient remains limited by dizziness with position changes, depressed looking, flat affect, SEVERE hardness of hearing despite hearing aids in place, generalized weakness and decreased activity tolerance along with deficits noted below. Pt continues to demonstrate fair rehab potential and would benefit from continued skilled OT to increase safety and independence with ADLs and functional transfers to allow pt to return home safely and reduce caregiver burden and fall risk.       If plan is discharge home, recommend the following:  A lot of help with bathing/dressing/bathroom;Assistance with cooking/housework;A lot of help with walking and/or transfers;Two people to help with walking and/or transfers;Supervision due to cognitive status   Equipment Recommendations  BSC/3in1    Recommendations for Other Services      Precautions / Restrictions Precautions Precautions: Fall Precaution Comments: abdominal precautions, jp drain Restrictions Weight Bearing Restrictions: No       Mobility Bed Mobility Overal bed mobility: Needs Assistance Bed Mobility: Rolling, Sidelying to Sit Rolling: Min assist, Used rails Sidelying to sit: Mod assist, Used rails        General bed mobility comments: cues for Log roll technique and assist to move to sitting upright    Transfers  See ADLs                       Balance Overall balance assessment: Needs assistance Sitting-balance support: No upper extremity supported, Feet supported Sitting balance-Leahy Scale: Fair     Standing balance support: Bilateral upper extremity supported, During functional activity Standing balance-Leahy Scale: Zero                             ADL either performed or assessed with clinical judgement   ADL Overall ADL's : Needs assistance/impaired     Grooming: Oral care;Wash/dry hands;Sitting Grooming Details (indicate cue type and reason): Planned to attempt standing at sink however pt very unsteady in standing at bedside with hard LT lateral leaning on OT with RW. 3 attempts for pt to stand upright with posterior LOB and need of external assist to correct balance. Pt assisted back to EOB with Mod As.  Performed Max As bear hug Stand pivot to Recliner with frequent cues to take steps as pt repeaterdly trying to sit down before reaching the chair.  Pt then assisted to sitting, and able to scoot posteriorly without assistance. Pt performed grooing at chair level.         Upper Body Dressing : Set up;Sitting   Lower Body Dressing: Maximal assistance;Sit to/from stand Lower Body Dressing Details (indicate cue type and reason): Total Assist socks.   Toilet Transfer Details (indicate cue type and reason): See Grooming above.   Toileting - Clothing Manipulation Details (indicate cue type and reason):  Purewick.     Functional mobility during ADLs: Maximal assistance;Moderate assistance;Rolling walker (2 wheels);Cueing for sequencing;Cueing for safety      Extremity/Trunk Assessment Upper Extremity Assessment Upper Extremity Assessment: Generalized weakness   Lower Extremity Assessment Lower Extremity Assessment: Generalized weakness    Cervical / Trunk Assessment Cervical / Trunk Assessment: Other exceptions Cervical / Trunk Exceptions: Abdominal surgery    Vision Baseline Vision/History: 6 Macular Degeneration;1 Wears glasses Ability to See in Adequate Light: 1 Impaired Patient Visual Report: No change from baseline Vision Assessment?: No apparent visual deficits   Perception     Praxis      Cognition Arousal: Alert Behavior During Therapy: Flat affect Overall Cognitive Status: Within Functional Limits for tasks assessed                                          Exercises      Shoulder Instructions       General Comments      Pertinent Vitals/ Pain       Pain Assessment Pain Assessment: No/denies pain  Home Living                                          Prior Functioning/Environment              Frequency  Min 1X/week        Progress Toward Goals  OT Goals(current goals can now be found in the care plan section)  Progress towards OT goals: Not progressing toward goals - comment  Acute Rehab OT Goals Patient Stated Goal: "I can't go home" OT Goal Formulation: With patient Time For Goal Achievement: 09/19/23 Potential to Achieve Goals: Fair  Plan      Co-evaluation                 AM-PAC OT "6 Clicks" Daily Activity     Outcome Measure   Help from another person eating meals?: A Little Help from another person taking care of personal grooming?: A Little Help from another person toileting, which includes using toliet, bedpan, or urinal?: A Lot Help from another person bathing (including washing, rinsing, drying)?: A Lot Help from another person to put on and taking off regular upper body clothing?: A Little Help from another person to put on and taking off regular lower body clothing?: Total 6 Click Score: 14    End of Session Equipment Utilized During Treatment: Gait belt;Rolling walker (2 wheels)  OT Visit Diagnosis: Other  abnormalities of gait and mobility (R26.89);Muscle weakness (generalized) (M62.81);Pain;Dizziness and giddiness (R42)   Activity Tolerance Patient limited by fatigue   Patient Left in chair;with chair alarm set;with call bell/phone within reach;with nursing/sitter in room   Nurse Communication Mobility status        Time: 4259-5638 OT Time Calculation (min): 23 min  Charges: OT General Charges $OT Visit: 1 Visit OT Treatments $Self Care/Home Management : 8-22 mins $Therapeutic Activity: 8-22 mins  Victorino Dike, OT Acute Rehab Services Office: [phone] 09/07/2023   Theodoro Clock 09/07/2023, 11:22 AM

## 2023-09-07 NOTE — Progress Notes (Signed)
Physical Therapy Treatment Patient Details Name: Alexa Pham MRN: [id number] DOB: 08/08/1930 Today's Date: 09/07/2023   History of Present Illness Pt is a 89 y/o female presenting on 11/27 with rectal prolapse s/p surgery 11/27. PMH includes: HTN, breast cancer s/p  partial mastectomy L side, macular degeneration, CHF, MI.    PT Comments  Today's PT session focused on bed mobility, transfers, and pre-gait side stepping. Pt required up to MOD A for bed mobility. Pt able to stand with MIN A+2 and perform side steps along EOB with MIN A+2 progressing to MIN A+1 with cues for sequencing of steps and assist for RW management. Pt will benefit from continued inpatient follow up therapy, <3 hours/day.  .   If plan is discharge home, recommend the following: A lot of help with walking and/or transfers;Assistance with cooking/housework;Help with stairs or ramp for entrance;Assist for transportation;A lot of help with bathing/dressing/bathroom;Direct supervision/assist for medications management;Direct supervision/assist for financial management   Can travel by private vehicle        Equipment Recommendations  None recommended by PT    Recommendations for Other Services       Precautions / Restrictions Precautions Precautions: Fall Precaution Comments: abdominal precautions, jp drain Restrictions Weight Bearing Restrictions: No     Mobility  Bed Mobility Overal bed mobility: Needs Assistance Bed Mobility: Rolling, Sidelying to Sit, Sit to Supine Rolling: Min assist Sidelying to sit: Mod assist, HOB elevated Supine to sit: Mod assist, HOB elevated     General bed mobility comments: multimodal cuing for log rolling technique with bed mobility. HOB slightly elevated    Transfers Overall transfer level: Needs assistance Equipment used: Rolling walker (2 wheels) Transfers: Sit to/from Stand Sit to Stand: Min assist, +2 physical assistance, +2 safety/equipment           General  transfer comment: MIN A +2 for power up to stand. Able to perform side steps with MIN A+2 and intermittent RW management, progressed to +1 during transfer. Verbal and tactile cuing for stepping. Pt repeatedly stating that she is tired, session limited.    Ambulation/Gait                   Stairs             Wheelchair Mobility     Tilt Bed    Modified Rankin (Stroke Patients Only)       Balance Overall balance assessment: Needs assistance Sitting-balance support: No upper extremity supported, Feet supported Sitting balance-Leahy Scale: Fair     Standing balance support: Bilateral upper extremity supported, During functional activity, Reliant on assistive device for balance Standing balance-Leahy Scale: Zero                              Cognition Arousal: Lethargic Behavior During Therapy: Flat affect Overall Cognitive Status: No family/caregiver present to determine baseline cognitive functioning                                 General Comments: asking therapist "where am I" required reorientation x2 during session. Able to verbalize name DOB when asked.        Exercises      General Comments General comments (skin integrity, edema, etc.): MD present during session and updated on reported pt performance from OT and improvement durign PT session this afternoon.      Pertinent  Vitals/Pain Pain Assessment Pain Assessment: No/denies pain    Home Living                          Prior Function            PT Goals (current goals can now be found in the care plan section) Acute Rehab PT Goals Patient Stated Goal: Regain IND PT Goal Formulation: With patient Time For Goal Achievement: 09/19/23 Potential to Achieve Goals: Good Progress towards PT goals: Progressing toward goals    Frequency    Min 1X/week      PT Plan      Co-evaluation              AM-PAC PT "6 Clicks" Mobility   Outcome  Measure  Help needed turning from your back to your side while in a flat bed without using bedrails?: A Lot Help needed moving from lying on your back to sitting on the side of a flat bed without using bedrails?: A Lot Help needed moving to and from a bed to a chair (including a wheelchair)?: Total Help needed standing up from a chair using your arms (e.g., wheelchair or bedside chair)?: Total Help needed to walk in hospital room?: Total Help needed climbing 3-5 steps with a railing? : Total 6 Click Score: 8    End of Session Equipment Utilized During Treatment: Gait belt;Oxygen Activity Tolerance: Patient limited by fatigue Patient left: in bed;with call bell/phone within reach;with bed alarm set Nurse Communication: Mobility status PT Visit Diagnosis: Unsteadiness on feet (R26.81);Difficulty in walking, not elsewhere classified (R26.2);Muscle weakness (generalized) (M62.81)     Time: 1445-1500 PT Time Calculation (min) (ACUTE ONLY): 15 min  Charges:    $Therapeutic Activity: 8-22 mins PT General Charges $$ ACUTE PT VISIT: 1 Visit                    Lyman Speller PT, DPT  Acute Rehabilitation Services  Office [phone]  09/07/2023, 3:27 PM

## 2023-09-07 NOTE — Progress Notes (Signed)
2 days post op  Subjective/Chief Complaint: Pt had some leaking around foley so it was removed.  She voided, but still had 700-800 ml on bladder scan so it was replaced.  This morning she feels "very exhausted."  Still has some lagging in her BP, but overall trend is better while holding meds.  Cr improved yesterday; not back yet today.  The thought of the omelet and toast on her tray makes her feel sick; she desires just a small bowl of fruit.    Objective: Vital signs in last 24 hours: Temp:  [98 F (36.7 C)-98.8 F (37.1 C)] 98.4 F (36.9 C) (11/30 0540) Pulse Rate:  [41-75] 74 (11/30 0540) Resp:  [16-18] 17 (11/30 0540) BP: (111-150)/(46-69) 150/66 (11/30 0540) SpO2:  [87 %-95 %] 93 % (11/30 0648) Weight:  [57.2 kg] 57.2 kg (11/30 0540) Last BM Date : 09/06/23  Intake/Output from previous day: 11/29 0701 - 11/30 0700 In: 1156.3 [P.O.:1080; I.V.:52.6; IV Piggyback:23.7] Out: 2280 [Urine:1650; Drains:630] Intake/Output this shift: Total I/O In: -  Out: 200 [Urine:200]  OOB in chair General appearance: alert, looks very fatigued.  Had difficulty getting her hearing aids in due to weakness.   Resp: breathing comfortably GI: soft, sl protuberant lower abd. Dressings c/d/I. Drain serosang, but higher volume Extremities: extremities normal, atraumatic, no cyanosis or edema  Lab Results:  Recent Labs    09/05/23 0452 09/05/23 1900  WBC 10.3 8.2  HGB 10.6* 10.2*  HCT 34.6* 33.3*  PLT 222 215   BMET Recent Labs    09/05/23 1900 09/06/23 0818  NA 135 134*  K 4.3 4.9  CL 103 105  CO2 26 22  GLUCOSE 125* 106*  BUN 31* 29*  CREATININE 1.78* 1.68*  CALCIUM 7.3* 7.8*   PT/INR No results for input(s): "LABPROT", "INR" in the last 72 hours. ABG No results for input(s): "PHART", "HCO3" in the last 72 hours.  Invalid input(s): "PCO2", "PO2"  Studies/Results: No results found.  Anti-infectives: Anti-infectives (From admission, onward)    Start     Dose/Rate  Route Frequency Ordered Stop   09/04/23 2000  cefoTEtan (CEFOTAN) 2 g in sodium chloride 0.9 % 100 mL IVPB        2 g 200 mL/hr over 30 Minutes Intravenous Every 12 hours 09/04/23 1413 09/04/23 2119   09/04/23 0700  cefoTEtan (CEFOTAN) 2 g in sodium chloride 0.9 % 100 mL IVPB        2 g 200 mL/hr over 30 Minutes Intravenous On call to O.R. 09/04/23 8657 09/04/23 0851       Assessment/Plan: s/p Procedure(s): ROBOTIC RECTOPEXY, RESECTION OF RECTOSIGMOID COLON (N/A) FLEXIBLE SIGMOIDOSCOPY (N/A)  Cr increased likely due to AKI from hypotension. Improving.  Drain output higher, likely from third spacing, but will send for creatinine level given nature of surgery.   Labs this AM written for, but not drawn yet/resulted yet. Nursing investigating.   Appreciate triad input.   Mild ABL anemia- recheck HCT this AM.   Pain control with tylenol, PRN tramadol Lovenox for VTE ppx.  Pulmonary toilet PT and OT to assist with discharge planning Advance diet to soft.   LOS: 3 days    Alexa Pham 09/07/2023

## 2023-09-07 NOTE — Progress Notes (Signed)
Pt becoming increasingly confused and flat today. This evening began calling out loudly from her room asking to be returned to bed. Reoriented patient that she was currently in bed. Call bell in reach with all needs met.

## 2023-09-07 NOTE — Progress Notes (Signed)
Called to patients room by nurse tech. Patient found to have leaked urine while foley catheter in place. Attempt to advance foley to prevent leakage and refill balloon, attempt unsuccessful. Bladder scan revealed at total of 880. MD on call, Carolynne Edouard notified. Per verbal order ok, to replace foley catheter. Foley inserted on 1 attempt. Patient tolerated well.

## 2023-09-07 NOTE — Progress Notes (Signed)
JP drain dressing changed

## 2023-09-07 NOTE — Plan of Care (Signed)
  Problem: Education: Goal: Understanding of discharge needs will improve Outcome: Progressing Goal: Verbalization of understanding of the causes of altered bowel function will improve Outcome: Progressing   Problem: Activity: Goal: Ability to tolerate increased activity will improve Outcome: Progressing   Problem: Bowel/Gastric: Goal: Gastrointestinal status for postoperative course will improve Outcome: Progressing   Problem: Health Behavior/Discharge Planning: Goal: Identification of community resources to assist with postoperative recovery needs will improve Outcome: Progressing

## 2023-09-08 DIAGNOSIS — K623 Rectal prolapse: Secondary | ICD-10-CM | POA: Diagnosis not present

## 2023-09-08 LAB — VITAMIN B12: Vitamin B-12: 549 pg/mL (ref 180–914)

## 2023-09-08 LAB — TSH: TSH: 0.804 u[IU]/mL (ref 0.350–4.500)

## 2023-09-08 LAB — RENAL FUNCTION PANEL
Albumin: 2.7 g/dL — ABNORMAL LOW (ref 3.5–5.0)
Anion gap: 3 — ABNORMAL LOW (ref 5–15)
BUN: 29 mg/dL — ABNORMAL HIGH (ref 8–23)
CO2: 28 mmol/L (ref 22–32)
Calcium: 8.9 mg/dL (ref 8.9–10.3)
Chloride: 105 mmol/L (ref 98–111)
Creatinine, Ser: 1.16 mg/dL — ABNORMAL HIGH (ref 0.44–1.00)
GFR, Estimated: 45 mL/min — ABNORMAL LOW (ref >60)
Glucose, Bld: 146 mg/dL — ABNORMAL HIGH (ref 70–99)
Phosphorus: 2.5 mg/dL (ref 2.5–4.6)
Potassium: 5 mmol/L (ref 3.5–5.1)
Sodium: 136 mmol/L (ref 135–145)

## 2023-09-08 LAB — MAGNESIUM: Magnesium: 1.9 mg/dL (ref 1.7–2.4)

## 2023-09-08 LAB — CBC
HCT: 37.4 % (ref 36.0–46.0)
Hemoglobin: 11.3 g/dL — ABNORMAL LOW (ref 12.0–15.0)
MCH: 30.7 pg (ref 26.0–34.0)
MCHC: 30.2 g/dL (ref 30.0–36.0)
MCV: 101.6 fL — ABNORMAL HIGH (ref 80.0–100.0)
Platelets: 246 10*3/uL (ref 150–400)
RBC: 3.68 MIL/uL — ABNORMAL LOW (ref 3.87–5.11)
RDW: 15.9 % — ABNORMAL HIGH (ref 11.5–15.5)
WBC: 8.4 10*3/uL (ref 4.0–10.5)
nRBC: 0 % (ref 0.0–0.2)

## 2023-09-08 LAB — T4, FREE: Free T4: 1.37 ng/dL — ABNORMAL HIGH (ref 0.61–1.12)

## 2023-09-08 LAB — PHOSPHORUS: Phosphorus: 2.5 mg/dL (ref 2.5–4.6)

## 2023-09-08 MED ORDER — SODIUM CHLORIDE 0.9 % IV BOLUS
500.0000 mL | Freq: Once | INTRAVENOUS | Status: AC
  Administered 2023-09-08: 500 mL via INTRAVENOUS

## 2023-09-08 MED ORDER — ENSURE SURGERY PO LIQD
237.0000 mL | Freq: Three times a day (TID) | ORAL | Status: DC
  Administered 2023-09-08: 237 mL via ORAL

## 2023-09-08 MED ORDER — MELATONIN 5 MG PO TABS: 5.0000 mg | ORAL_TABLET | Freq: Every day | ORAL | Status: DC

## 2023-09-08 MED ORDER — FLUCONAZOLE 100 MG PO TABS
100.0000 mg | ORAL_TABLET | Freq: Every day | ORAL | Status: AC
  Administered 2023-09-09 – 2023-09-14 (×6): 100 mg via ORAL
  Filled 2023-09-08 (×6): qty 1

## 2023-09-08 MED ORDER — SIMETHICONE 80 MG PO CHEW
40.0000 mg | CHEWABLE_TABLET | Freq: Four times a day (QID) | ORAL | Status: DC
  Administered 2023-09-08 – 2023-09-09 (×4): 40 mg via ORAL
  Filled 2023-09-08 (×4): qty 1

## 2023-09-08 MED ORDER — KCL IN DEXTROSE-NACL 10-5-0.45 MEQ/L-%-% IV SOLN
INTRAVENOUS | Status: DC
  Filled 2023-09-08: qty 1000

## 2023-09-08 MED ORDER — FLUCONAZOLE 100 MG PO TABS
200.0000 mg | ORAL_TABLET | Freq: Once | ORAL | Status: AC
  Administered 2023-09-08: 200 mg via ORAL
  Filled 2023-09-08: qty 2

## 2023-09-08 NOTE — Progress Notes (Signed)
SLP Cancellation Note  Patient Details Name: Alexa Pham MRN: [id number] DOB: 1930/07/16   Cancelled treatment:       Reason Eval/Treat Not Completed: Patient's level of consciousness Patient minimally responsive and unable to adequately arouse for swallow evaluation. Spoke with MD and RN briefly in hallway, both of whom reporting patient has been very sleepy today. MD also suspects oral thrush. SLP will f/u next date.  Angela Nevin, MA, CCC-SLP Speech Therapy

## 2023-09-08 NOTE — Progress Notes (Signed)
Physical Therapy Treatment Patient Details Name: Alexa Pham MRN: [id number] DOB: [birth year] Today's Date: 09/08/2023   History of Present Illness Pt is a 89 y/o female presenting on 11/27 with rectal prolapse s/p surgery 11/27. PMH includes: HTN, breast cancer s/p  partial mastectomy L side, macular degeneration, CHF, MI.    PT Comments  Today's PT session focused on bed mobility and transfers. Pt requiring increased assist for all mobility this date demonstrating intermittent poor seated balance and difficulty taking side/forward steps despite MOD A+2. Pt presenting lethargic throughout session required intermittent auditory and tactile stimuli to maintain arousal. Pt will benefit from continued skilled PT to increase their independence and maximize safety with mobility.      If plan is discharge home, recommend the following: A lot of help with walking and/or transfers;Assistance with cooking/housework;Help with stairs or ramp for entrance;Assist for transportation;A lot of help with bathing/dressing/bathroom;Direct supervision/assist for medications management;Direct supervision/assist for financial management   Can travel by private vehicle        Equipment Recommendations  None recommended by PT    Recommendations for Other Services       Precautions / Restrictions Precautions Precautions: Fall Precaution Comments: abdominal precautions, jp drain Restrictions Weight Bearing Restrictions: No     Mobility  Bed Mobility Overal bed mobility: Needs Assistance Bed Mobility: Rolling, Sidelying to Sit, Sit to Supine Rolling: Mod assist Sidelying to sit: Mod assist, HOB elevated, +2 for physical assistance, +2 for safety/equipment Supine to sit: Mod assist, HOB elevated, +2 for physical assistance, +2 for safety/equipment     General bed mobility comments: multimodal cuing for log rolling technique with bed mobility. HOB slightly elevated. slow to initiate movements. Intermittent  slumped posture while seated EOB during rest breaks fluctuating between close supervision and MIN A for seated balance.    Transfers Overall transfer level: Needs assistance Equipment used: Rolling walker (2 wheels) Transfers: Sit to/from Stand Sit to Stand: +2 physical assistance, +2 safety/equipment, Mod assist           General transfer comment: STS x3; Initially MOD A+2 for power up to stand.Prorgessed to MIN A+2 on following reps.  Difficulty with taking side steps this date despite MOD A+2 and increased facilitation for offloading/weight shifitng and multimodal cues for sequencing of steps. X2 instances of pt returning to sit without being cued.    Ambulation/Gait                   Stairs             Wheelchair Mobility     Tilt Bed    Modified Rankin (Stroke Patients Only)       Balance Overall balance assessment: Needs assistance Sitting-balance support: No upper extremity supported, Feet supported Sitting balance-Leahy Scale: Fair     Standing balance support: Bilateral upper extremity supported, During functional activity, Reliant on assistive device for balance Standing balance-Leahy Scale: Zero                              Cognition Arousal: Lethargic Behavior During Therapy: Flat affect Overall Cognitive Status: No family/caregiver present to determine baseline cognitive functioning                                 General Comments: Lethargic requiring auditory and tactile stimulus throughout session to manitain arousal  Exercises      General Comments        Pertinent Vitals/Pain Pain Assessment Pain Assessment: No/denies pain    Home Living                          Prior Function            PT Goals (current goals can now be found in the care plan section) Acute Rehab PT Goals Patient Stated Goal: Regain IND PT Goal Formulation: With patient Time For Goal Achievement:  09/19/23 Potential to Achieve Goals: Good Progress towards PT goals: Not progressing toward goals - comment    Frequency    Min 1X/week      PT Plan      Co-evaluation              AM-PAC PT "6 Clicks" Mobility   Outcome Measure  Help needed turning from your back to your side while in a flat bed without using bedrails?: A Lot Help needed moving from lying on your back to sitting on the side of a flat bed without using bedrails?: Total Help needed moving to and from a bed to a chair (including a wheelchair)?: Total Help needed standing up from a chair using your arms (e.g., wheelchair or bedside chair)?: Total Help needed to walk in hospital room?: Total Help needed climbing 3-5 steps with a railing? : Total 6 Click Score: 7    End of Session Equipment Utilized During Treatment: Gait belt;Oxygen Activity Tolerance: Patient limited by lethargy Patient left: in bed;with call bell/phone within reach;with bed alarm set Nurse Communication: Mobility status PT Visit Diagnosis: Unsteadiness on feet (R26.81);Difficulty in walking, not elsewhere classified (R26.2);Muscle weakness (generalized) (M62.81)     Time: 1308-6578 PT Time Calculation (min) (ACUTE ONLY): 20 min  Charges:    $Therapeutic Activity: 8-22 mins PT General Charges $$ ACUTE PT VISIT: 1 Visit                     Lyman Speller PT, DPT  Acute Rehabilitation Services  Office [phone]  09/08/2023, 2:59 PM

## 2023-09-08 NOTE — Progress Notes (Signed)
2 days post op  Subjective/Chief Complaint: Pt had some leaking around foley so it was removed.  She voided, but still had 700-800 ml on bladder scan so it was replaced.  This morning she feels "very exhausted."  Still has some lagging in her BP, but overall trend is better while holding meds.  Cr improved yesterday; not back yet today.  The thought of the omelet and toast on her tray makes her feel sick; she desires just a small bowl of fruit.    Objective: Vital signs in last 24 hours: Temp:  [97.7 F (36.5 C)-98.2 F (36.8 C)] 97.7 F (36.5 C) (12/01 0830) Pulse Rate:  [77-84] 84 (12/01 0830) Resp:  [16-18] 16 (12/01 0830) BP: (112-156)/(54-73) 156/67 (12/01 0830) SpO2:  [93 %-98 %] 97 % (12/01 0830) Weight:  [59.7 kg] 59.7 kg (12/01 0500) Last BM Date : 09/07/23  Intake/Output from previous day: 11/30 0701 - 12/01 0700 In: 2287.1 [P.O.:840; I.V.:1447.1] Out: 2960 [Urine:2250; Drains:710] Intake/Output this shift: Total I/O In: 160 [P.O.:160] Out: 290 [Urine:200; Drains:90]  OOB in chair General appearance: alert, looks very fatigued.  Had difficulty getting her hearing aids in due to weakness.   Resp: breathing comfortably GI: soft, sl protuberant lower abd. Dressings c/d/I. Drain serosang, but higher volume Extremities: extremities normal, atraumatic, no cyanosis or edema  Lab Results:  Recent Labs    09/07/23 1108 09/08/23 0412  WBC 9.3 8.4  HGB 11.1* 11.3*  HCT 35.1* 37.4  PLT 223 246   BMET Recent Labs    09/07/23 1108 09/08/23 0412  NA 135 136  K 4.6 5.0  CL 105 105  CO2 23 28  GLUCOSE 124* 146*  BUN 29* 29*  CREATININE 1.27* 1.16*  CALCIUM 8.6* 8.9   PT/INR No results for input(s): "LABPROT", "INR" in the last 72 hours. ABG No results for input(s): "PHART", "HCO3" in the last 72 hours.  Invalid input(s): "PCO2", "PO2"  Studies/Results: No results found.  Anti-infectives: Anti-infectives (From admission, onward)    Start     Dose/Rate  Route Frequency Ordered Stop   09/09/23 1000  fluconazole (DIFLUCAN) tablet 100 mg       Placed in "Followed by" Linked Group   100 mg Oral Daily 09/08/23 0749 09/15/23 0959   09/08/23 0845  fluconazole (DIFLUCAN) tablet 200 mg       Placed in "Followed by" Linked Group   200 mg Oral  Once 09/08/23 0749 09/08/23 0851   09/04/23 2000  cefoTEtan (CEFOTAN) 2 g in sodium chloride 0.9 % 100 mL IVPB        2 g 200 mL/hr over 30 Minutes Intravenous Every 12 hours 09/04/23 1413 09/04/23 2119   09/04/23 0700  cefoTEtan (CEFOTAN) 2 g in sodium chloride 0.9 % 100 mL IVPB        2 g 200 mL/hr over 30 Minutes Intravenous On call to O.R. 09/04/23 1610 09/04/23 0851       Assessment/Plan: s/p Procedure(s): ROBOTIC RECTOPEXY, RESECTION OF RECTOSIGMOID COLON (N/A) FLEXIBLE SIGMOIDOSCOPY (N/A) Gross 09/04/23  Cr increased likely due to AKI from hypotension. Improving.  Drain output 500-700 mL/day, but cr normal, so no evidence of ureteral injury. Marland Kitchen   Appreciate triad input.   Mild ABL anemia- stable  Pain control with tylenol, PRN tramadol. Has still needed occasional low dose dilaudid.  Lovenox for VTE ppx.  Pulmonary toilet PT and OT to assist with discharge planning FEN- GI tract working. Soft diet as tolerated   LOS: 4  days    Maudry Diego, MD, FACS, FSSO Surgical Oncology, General Surgery, Trauma and Critical [facility], [place] [phone] for weekday/non holidays Check [email or web address] for coverage night/weekend/holidays

## 2023-09-08 NOTE — Plan of Care (Signed)
  Problem: Education: Goal: Understanding of discharge needs will improve Outcome: Progressing Goal: Verbalization of understanding of the causes of altered bowel function will improve Outcome: Progressing   Problem: Activity: Goal: Ability to tolerate increased activity will improve Outcome: Progressing   Problem: Bowel/Gastric: Goal: Gastrointestinal status for postoperative course will improve Outcome: Progressing   Problem: Health Behavior/Discharge Planning: Goal: Identification of community resources to assist with postoperative recovery needs will improve Outcome: Progressing

## 2023-09-08 NOTE — Progress Notes (Signed)
[facility] Consultation Progress Note  Patient: Alexa Pham [id number]:[id number]   PCP: Dale [place], MD DOB: Jun 12, 1930   DOA: 09/04/2023   DOS: 09/08/2023   Date of Service: the patient was seen and examined on 09/08/2023 Primary service: Karie Soda, MD   Brief hospital course: PMH of asthma, CAD, CHF, hypothyroidism, HLD, HTN, hard of hearing, pulmonary hypertension presented for robotic low anterior rectosigmoid resection with anastomosis. Medicine service was consulted for hypotension as well as AKI.   Assessment and Plan: AKI. Baseline serum creatinine 0.9. On admission serum creatinine 1.5 worsened to 1.7. Now improving  Continue with IV hydration.  Urine output is adequate.  Less likely obstruction. Avoid hypotension.  Avoid nephrotoxic medications. For now monitor.   History of hypertension but now with hypotension. On amlodipine 2.5 mg at home as well as Coreg 6.25 mg twice daily at home with Lasix and irbesartan. Postoperatively blood pressure has remaining low. Medications are on hold. Coreg was continued for mild elevated blood pressure but due to significant fluctuation I will reduce the dose. Monitor for now.   CAD. On aspirin.  And Plavix. Currently on aspirin. Resumption of Plavix per surgery.   HLD. On Lipitor.   Hypothyroidism. On Synthroid 150 mcg. TSH 2.56 in June. TSH and free T4 adequate.  Would not change dose for now.   Prediabetes. Hemoglobin A1c 5.8. Continue sensitive sliding scale.  Acute encephalopathy likely in the setting of poor sleep hygiene. Will discontinue gabapentin. Minimize narcotics. Change melatonin to scheduled and increase the dose to 5 mg nightly.  We will continue to follow the patient.   Subjective: No nausea no vomiting no fever no chills.  More lethargic today.  Did not sleep last night.  Objective: Vitals:   09/08/23 0824 09/08/23 0830 09/08/23 1318 09/08/23 1637  BP:  (!) 156/67 (!) 157/71 (!) 156/86   Pulse:  84 83 86  Resp:  16 16   Temp:  97.7 F (36.5 C) 98.4 F (36.9 C)   TempSrc:  Oral Oral   SpO2: 95% 97% 97%   Weight:      Height:        Clear to auscultation but S1-S2 present. Oral mucosa severely dry No edema. Able to follow commands although drowsy but easily awakened. Continues to remain hard of hearing.  Family Communication: No one at bedside  Data Reviewed: Since last encounter, pertinent lab results CBC and BMP   . I have ordered test including CBC and BMP  .   Author: Lynden Oxford, MD  [facility] 09/08/2023  6:11 PM  To reach On-call, Look up on care teams to locate the [facility] team or provider name and reach out to them via secure chat or [email or web address] Between 7PM-7AM, please contact night-coverage. If you still have difficulty reaching the attending provider, please page the [facility] (Director on Call) for [facility] on amion for assistance.

## 2023-09-08 NOTE — Progress Notes (Signed)
Pt moaned aloud stating she wanted to get up out of bed to urinate. Nurse explained to her that she has a foley catheter and it's purpose. She then stated she wanted her Ivs out and was attempting to get out of bed. Pt had pulled JP bulb from tubing. Nurse and NT talked to pt and told her that she needed her Ivs and to not pull on JP bulb. Pt was repositioned in bed and was given a Melatonin-see MAR. Bed remains in lowest position, bed alarm activated. Room door open.

## 2023-09-09 ENCOUNTER — Inpatient Hospital Stay (HOSPITAL_COMMUNITY): Payer: Medicare Other

## 2023-09-09 ENCOUNTER — Encounter: Payer: Self-pay | Admitting: Internal Medicine

## 2023-09-09 DIAGNOSIS — K623 Rectal prolapse: Secondary | ICD-10-CM | POA: Diagnosis not present

## 2023-09-09 LAB — CBC
HCT: 34.4 % — ABNORMAL LOW (ref 36.0–46.0)
Hemoglobin: 10.5 g/dL — ABNORMAL LOW (ref 12.0–15.0)
MCH: 31.3 pg (ref 26.0–34.0)
MCHC: 30.5 g/dL (ref 30.0–36.0)
MCV: 102.7 fL — ABNORMAL HIGH (ref 80.0–100.0)
Platelets: 233 10*3/uL (ref 150–400)
RBC: 3.35 MIL/uL — ABNORMAL LOW (ref 3.87–5.11)
RDW: 16.2 % — ABNORMAL HIGH (ref 11.5–15.5)
WBC: 9.3 10*3/uL (ref 4.0–10.5)
nRBC: 0 % (ref 0.0–0.2)

## 2023-09-09 LAB — URINALYSIS, COMPLETE (UACMP) WITH MICROSCOPIC
Bacteria, UA: NONE SEEN
Bilirubin Urine: NEGATIVE
Glucose, UA: NEGATIVE mg/dL
Hgb urine dipstick: NEGATIVE
Ketones, ur: NEGATIVE mg/dL
Leukocytes,Ua: NEGATIVE
Nitrite: NEGATIVE
Protein, ur: NEGATIVE mg/dL
Specific Gravity, Urine: 1.008 (ref 1.005–1.030)
pH: 5 (ref 5.0–8.0)

## 2023-09-09 LAB — MAGNESIUM: Magnesium: 1.9 mg/dL (ref 1.7–2.4)

## 2023-09-09 LAB — RENAL FUNCTION PANEL
Albumin: 2.4 g/dL — ABNORMAL LOW (ref 3.5–5.0)
Anion gap: 3 — ABNORMAL LOW (ref 5–15)
BUN: 18 mg/dL (ref 8–23)
CO2: 25 mmol/L (ref 22–32)
Calcium: 8.7 mg/dL — ABNORMAL LOW (ref 8.9–10.3)
Chloride: 107 mmol/L (ref 98–111)
Creatinine, Ser: 0.93 mg/dL (ref 0.44–1.00)
GFR, Estimated: 58 mL/min — ABNORMAL LOW (ref >60)
Glucose, Bld: 139 mg/dL — ABNORMAL HIGH (ref 70–99)
Phosphorus: 2.2 mg/dL — ABNORMAL LOW (ref 2.5–4.6)
Potassium: 5.1 mmol/L (ref 3.5–5.1)
Sodium: 135 mmol/L (ref 135–145)

## 2023-09-09 LAB — PREALBUMIN: Prealbumin: 11 mg/dL — ABNORMAL LOW (ref 18–38)

## 2023-09-09 MED ORDER — HYDROCODONE-ACETAMINOPHEN 5-325 MG PO TABS
1.0000 | ORAL_TABLET | ORAL | Status: DC | PRN
  Filled 2023-09-09: qty 1

## 2023-09-09 MED ORDER — DEXTROSE-SODIUM CHLORIDE 5-0.45 % IV SOLN: INTRAVENOUS | Status: DC

## 2023-09-09 MED ORDER — FENTANYL CITRATE PF 50 MCG/ML IJ SOSY: 12.5000 ug | PREFILLED_SYRINGE | INTRAMUSCULAR | Status: DC | PRN

## 2023-09-09 MED ORDER — LACTATED RINGERS IV BOLUS: 1000.0000 mL | Freq: Three times a day (TID) | INTRAVENOUS | Status: DC | PRN

## 2023-09-09 MED ORDER — ADULT MULTIVITAMIN W/MINERALS CH
1.0000 | ORAL_TABLET | Freq: Every day | ORAL | Status: DC
  Administered 2023-09-09: 1 via ORAL
  Filled 2023-09-09: qty 1

## 2023-09-09 MED ORDER — SIMETHICONE 40 MG/0.6ML PO SUSP
40.0000 mg | Freq: Four times a day (QID) | ORAL | Status: DC
  Administered 2023-09-09 – 2023-09-17 (×27): 40 mg via ORAL
  Filled 2023-09-09 (×28): qty 0.6

## 2023-09-09 MED ORDER — ACETAMINOPHEN 325 MG PO TABS: 325.0000 mg | ORAL_TABLET | Freq: Four times a day (QID) | ORAL | Status: DC | PRN

## 2023-09-09 MED ORDER — CENTRUM FRESH/FRUITY ADULT PO CHEW
1.0000 | CHEWABLE_TABLET | Freq: Two times a day (BID) | ORAL | Status: DC
  Filled 2023-09-09: qty 1

## 2023-09-09 MED ORDER — CYCLOBENZAPRINE HCL 5 MG PO TABS: 5.0000 mg | ORAL_TABLET | Freq: Three times a day (TID) | ORAL | Status: DC | PRN

## 2023-09-09 MED ORDER — ENSURE SURGERY PO LIQD
237.0000 mL | Freq: Two times a day (BID) | ORAL | Status: DC
  Administered 2023-09-09: 237 mL via ORAL

## 2023-09-09 MED ORDER — HYDROCODONE-ACETAMINOPHEN 5-325 MG PO TABS: 1.0000 | ORAL_TABLET | ORAL | Status: DC | PRN

## 2023-09-09 MED ORDER — CLOPIDOGREL BISULFATE 75 MG PO TABS
75.0000 mg | ORAL_TABLET | Freq: Every day | ORAL | Status: DC
  Administered 2023-09-09 – 2023-09-17 (×9): 75 mg via ORAL
  Filled 2023-09-09 (×9): qty 1

## 2023-09-09 MED ORDER — DEXTROSE-SODIUM CHLORIDE 5-0.9 % IV SOLN: INTRAVENOUS | Status: DC

## 2023-09-09 NOTE — Progress Notes (Signed)
RN attempted to give patient ordered PO Norco, patient having trouble swallowing, RN wasted PO Norco with RN Lind Covert in medication room, medication waste documented in [place].

## 2023-09-09 NOTE — Progress Notes (Signed)
Initial Nutrition Assessment  INTERVENTION:   -Reorder Calorie count when pt is on a diet and if still warranted  -Monitor for diet advancement -If diet advanced, recommend Ensure Plus High Protein po BID, each supplement provides 350 kcal and 20 grams of protein.  -If diet unable to be advanced, consider small bore NGT placement  NUTRITION DIAGNOSIS:   Inadequate oral intake related to poor appetite as evidenced by per patient/family report, NPO status.  GOAL:   Patient will meet greater than or equal to 90% of their needs  MONITOR:   Labs, Weight trends, I & O's, Diet advancement  REASON FOR ASSESSMENT:   Consult Calorie Count  ASSESSMENT:   89 y.o. PMH of asthma, CAD, CHF, hypothyroidism, HLD, HTN, hard of hearing, pulmonary hypertension presented for robotic low anterior rectosigmoid resection with anastomosis.  11/27: admitted, s/p rectopexy, resection of rectosigmoid colon  Patient being wheeled off floor at time of visit. Will attempt to see patient at a later time. Calorie count was ordered this morning. However, late this morning pt was made NPO. SLP evaluated and recommends pt remain NPO as pt is minimally alert and oriented to self only. Will d/c Calorie count order at this time and can be restarted later once on a diet. Per surgery note, may need NGT but wants to trial PO if able. Will monitor for plan.  Per weight records, pt with stable weight since July.  Admission weight: 110 lbs Current weight: 132 lbs  Medications: D5 infusion  Labs reviewed: Low Phos   NUTRITION - FOCUSED PHYSICAL EXAM:  Unable to complete  Diet Order:   Diet Order             Diet NPO time specified Except for: Ice Chips, Sips with Meds  Diet effective midnight           Diet - low sodium heart healthy                   EDUCATION NEEDS:   Not appropriate for education at this time  Skin:  Skin Assessment: Skin Integrity Issues: Skin Integrity Issues::  Incisions Incisions: 11/27 abdomen  Last BM:  11/30 -type 6  Height:   Ht Readings from Last 1 Encounters:  09/04/23 5\' 6"  (1.676 m)    Weight:   Wt Readings from Last 1 Encounters:  09/09/23 60.3 kg    BMI:  Body mass index is 21.46 kg/m.  Estimated Nutritional Needs:   Kcal:  1450-1650  Protein:  65-75g  Fluid:  1.6L/day   Tilda Franco, MS, RD, LDN Inpatient Clinical Dietitian Contact information available via Amion

## 2023-09-09 NOTE — Progress Notes (Signed)
[facility] Consultation Progress Note  Patient: Alexa Pham UEA:[id number]   PCP: Dale [place], MD DOB: Dec 14, 1929   DOA: 09/04/2023   DOS: 09/09/2023   Date of Service: the patient was seen and examined on 09/09/2023 Primary service: Karie Soda, MD   Brief hospital course: PMH of asthma, CAD, CHF, hypothyroidism, HLD, HTN, hard of hearing, pulmonary hypertension presented for robotic low anterior rectosigmoid resection with anastomosis. Medicine service was consulted for hypotension as well as AKI.   Assessment and Plan: AKI. Baseline serum creatinine 0.9. On admission serum creatinine 1.5 worsened to 1.7. Now improving to normal values. Due to poor p.o. intake continue with IV hydration. Avoid hypotension.  Avoid nephrotoxic medications.  Acute delirium, likely sundowning. Undiagnosed dementia. Patient appears to be withdrawn,.  Was lethargic on 12/1.  Was unable to sleep on 11/30 night as well as 12/1 night. Hard of hearing therefore it takes effort for her to understand questions. CT of the head unremarkable. Metabolic workup including B12 478, free T4 1.37 , TSH 0.8, folate 18.9, UA unremarkable. No focal deficit at the time of my evaluation. No asterixis as well. Speech was consulted, due to lethargy and inability to follow commands recommended n.p.o. but if she perks up recommended to advance her to dysphagia 2 diet. Will advance to dysphagia 2 diet. Delirium precaution.  Avoid psychotropic medications.  Minimize narcotics.  History of hypertension but now with hypotension. On amlodipine 2.5 mg at home as well as Coreg 6.25 mg twice daily at home with Lasix and irbesartan. Postoperatively blood pressure was low. Medications are on hold. Coreg was continued for mild elevated blood pressure but due to significant fluctuation I will reduce the dose. Monitor for now.   CAD. On aspirin.  And Plavix. Currently on aspirin. Resumption of Plavix per surgery.    HLD. On Lipitor.   Hypothyroidism. On Synthroid 150 mcg. TSH 2.56 in June. TSH and free T4 adequate.  Would not change dose for now.   Prediabetes. Hemoglobin A1c 5.8. Continue sensitive sliding scale.   We will continue to follow the patient.   Subjective: No nausea no vomiting.  Minimal oral intake.  Has been drowsy intermittently.  Was not able to follow commands with the speech therapy was there but was able to follow commands at the time of my evaluation.  Son who was at bedside felt that she did not talk with her by the time of my evaluation with the son at bedside she did not respond to the son as well.  Objective: Vitals:   09/09/23 0525 09/09/23 0737 09/09/23 1350 09/09/23 1657  BP: (!) 149/72  (!) 181/83 (!) 188/76  Pulse: 83  79 97  Resp: 18  18 18   Temp: 98.3 F (36.8 C)  98.6 F (37 C) 99.2 F (37.3 C)  TempSrc:   Oral Oral  SpO2: 98% 97% 97% 97%  Weight:      Height:      General: in Mild distress, No Rash, oral mucosa shows resolution of the thrush Cardiovascular: S1 and S2 Present, No Murmur Respiratory: Good respiratory effort, Bilateral Air entry present.  Upper airway crackles, No wheezes Abdomen: Bowel Sound present, mild right-sided tenderness Extremities: No edema Neuro: Alert and oriented x self, no new focal deficit   Family Communication: Son at bedside. He was appropriately worried. He was asking if neuro needs to see her and I recommended that at present she appears to be suffering from delirium and with normal CT scan currently no  indication for neurology consultation. He said she appears to be unable to speak. But she did respond to him when I was in the room. He brought hamburger for her and I recommended that she would not be able to eat solid food given her fatigue.  I recommended if he wants to bring anything pured that will be ok.   Data Reviewed: Since last encounter, pertinent lab results CBC and BMP   . I have ordered test including CBC  and BMP  . I have discussed pt's care plan and test results with general surgery  . I have ordered imaging CT head  .   Author: Lynden Oxford, MD  [facility] 09/09/2023  6:01 PM  To reach On-call, Look up on care teams to locate the [facility] team or provider name and reach out to them via secure chat or [email or web address] Between 7PM-7AM, please contact night-coverage. If you still have difficulty reaching the attending provider, please page the [facility] (Director on Call) for [facility] on amion for assistance.

## 2023-09-09 NOTE — Hospital Course (Signed)
Brief hospital course: PMH of asthma, CAD, CHF, hypothyroidism, HLD, HTN, hard of hearing, pulmonary hypertension presented for robotic low anterior rectosigmoid resection with anastomosis. Medicine service was consulted for hypotension as well as AKI.   Assessment and Plan: AKI. Baseline serum creatinine 0.9. On admission serum creatinine 1.5 worsened to 1.7. Now improving to normal values. Due to poor p.o. intake continue with IV hydration. Avoid hypotension.  Avoid nephrotoxic medications.  Acute delirium, likely sundowning. Undiagnosed dementia. Patient appears to be withdrawn,.  Was lethargic on 12/1.  Was unable to sleep on 11/30 night as well as 12/1 night. Hard of hearing therefore it takes effort for her to understand questions. CT of the head unremarkable. Metabolic workup including B12 409, free T4 1.37 , TSH 0.8, folate 18.9, UA unremarkable. No focal deficit at the time of my evaluation. No asterixis as well. Speech was consulted, due to lethargy and inability to follow commands recommended n.p.o. but if she perks up recommended to advance her to dysphagia 2 diet. Will advance to dysphagia 2 diet. Delirium precaution.  Avoid psychotropic medications.  Minimize narcotics.  History of hypertension but now with hypotension. On amlodipine 2.5 mg at home as well as Coreg 6.25 mg twice daily at home with Lasix and irbesartan. Postoperatively blood pressure was low. Medications are on hold. Coreg was continued for mild elevated blood pressure but due to significant fluctuation I will reduce the dose. Monitor for now.   CAD. On aspirin.  And Plavix. Currently on aspirin. Resumption of Plavix per surgery.   HLD. On Lipitor.   Hypothyroidism. On Synthroid 150 mcg. TSH 2.56 in June. TSH and free T4 adequate.  Would not change dose for now.   Prediabetes. Hemoglobin A1c 5.8. Continue sensitive sliding scale.

## 2023-09-09 NOTE — Progress Notes (Addendum)
09/09/2023  Alexa Pham [id number] 1929-12-21  CARE TEAM: PCP: Dale Richland, MD  Outpatient Care Team: Patient Care Team: Dale , MD as PCP - General (Internal Medicine) Salena Saner, MD as Consulting Physician (Pulmonary Disease) Elige Radon, MD as Referring Physician (Cardiology) Mia Creek Rossie Muskrat, MD as Consulting Physician (Gastroenterology) Karie Soda, MD as Consulting Physician (General Surgery) Lamar Blinks, MD as Referring Physician (Cardiology) Vanna Scotland, MD as Consulting Physician (Urology)  Inpatient Treatment Team: Treatment Team:  Karie Soda, MD Lilyan Gilford, MD Kelby Aline, RN Christell Constant, NT Norva Pavlov, RPH Chalmers Guest, RN Angela Nevin, CCC-SLP Worischeck, Lise Auer, OT Marline Backbone, Student-SLP   Problem List:   Principal Problem:   Complete rectal prolapse Active Problems:   Hypertension   Hypothyroidism   Hypercholesterolemia   Aortic atherosclerosis (HCC)   CAD (coronary artery disease)   Urge incontinence   Rectal prolapse   History of non-ST elevation myocardial infarction (NSTEMI)   Chronic anticoagulation   SUI (stress urinary incontinence, female)   Asthma   History of coronary artery stent placement 2021   AKI (acute kidney injury) (HCC)   Hypotension   Macrocytic anemia   Hypocalcemia   Moderate protein malnutrition (HCC)   09/04/2023  POST-OPERATIVE DIAGNOSIS:  RECTAL PROLAPSE   PROCEDURE:   ROBOTIC LOW ANTERIOR RECTOSIGMOID RESECTION WITH ANASTOMOSIS ROBOTIC RECTOPEXY FLEXIBLE SIGMOIDOSCOPY TRANSVERSUS ABDOMINIS PLANE (TAP) BLOCK - BILATERAL   Surgeons:  Karie Soda, MD  OR FINDINGS:  Very redundant sigmoid colon resting in the low pelvis with some adhesions at the left adnexa.  1/2 feet of redundant rectosigmoid colon excised.   Patient has a descending colon to proximal rectal 31 EEA stapled anastomosis that rests 12-13 cm from the anal  verge  Assessment [name] Stay = 5 days) 5 Days Post-Op    Sundowning and deconditioned    Plan:  -I had discussion with Dr. Allena Katz with [facility] hospitalist.  Help is appreciated.  She seemed to be an agitated and sundowning which is most likely explanation for her being more confused today.  However discussed about trying to get a CT of the head or MRI to rule out stroke.  She does not really seem to have any focal deficits.  Will switch her pain medicines around to make sure that is not a factor.  She is not actively bleeding so I think it safe to start her Plavix back up.  Try and adjust her pain control regimen.  I see no reason to keep her Foley in and she would be someone who would be at risk for UTI.  Send urinalysis to rule out a UTI and remove Foley.  She needs nutrition.  Need to try and get her to eat.  She normally ate well without any complaints of dysphagia so I doubt she has a long-term aspiration risk at baseline, but with her confusion that raises concerns.  Most likely try a mechanically softened dysphagia 3 diet.  Hard to see what is going on until her mental status improves.  She tends to be rather picky eater so it is a challenge.  See if we can convince her to do some supplemental shakes at least.  Would like to try and hold off on a Corpak feeding tube, but I worry were going to need to start that soon  IV fluids for now until she can eat.  Drainage is serosanguineous and was negative for creatinine or against any ureteral or anastomotic leak.  I think she tends to use diuretics and this is acting as it for her.  Would hold off on removing it until the output comes down and I know she is eating.  History of significant hypertension on numerous medications and being held right now.  Follow  VTE prophylaxis- SCDs, etc  mobilize as tolerated to help recovery.  Try and get her out of bed.  Nursing and physical Occupational Therapy consults  appreciated  Unfortunately with her being rather deconditioned, her fear of being at a SNF is becoming more likely.  She usually is pretty independent and active and seems to not have any major issue with the rectal prolapse/rectal resection and should improve.  However until we can get her nutrition better, she will struggle to improve  -Disposition:  Disposition:  The patient is from: Home Anticipate discharge to:  Skilled Nursing Facility (SNF) Anticipated Date of Discharge is:  December 5,2024   Barriers to discharge:  Consultant clearance & sign off  , Transitions of Care, Therapy assessment & Recommendations pending, Need for inpatient procedure/study, and Pending Clinical improvement (more likely than not)  Patient currently is NOT MEDICALLY STABLE for discharge from the hospital from a surgery standpoint.      I reviewed nursing notes, hospitalist notes, last 24 h vitals and pain scores, last 48 h intake and output, last 24 h labs and trends, and last 24 h imaging results.  I have reviewed this patient's available data, including medical history, events of note, test results, etc as part of my evaluation.   A significant portion of that time was spent in counseling. Care during the described time interval was provided by me.  This care required high  level of medical decision making.  09/09/2023    Subjective: (Chief complaint)  Patient tired and sleepy.  Denies much pain.  Does not have much of an appetite but denies any nausea.  Objective:  Vital signs:  Vitals:   09/09/23 0112 09/09/23 0500 09/09/23 0525 09/09/23 0737  BP: (!) 168/81  (!) 149/72   Pulse: 83  83   Resp: 20  18   Temp: 98.2 F (36.8 C)  98.3 F (36.8 C)   TempSrc: Oral     SpO2: 95%  98% 97%  Weight:  60.3 kg    Height:        Last BM Date : 09/07/23  Intake/Output   Yesterday:  12/01 0701 - 12/02 0700 In: 2557.8 [P.O.:300; I.V.:1757.8; IV Piggyback:500] Out: 1780 [Urine:1450;  Drains:330] This shift:  No intake/output data recorded.  Bowel function:  Flatus: YES  BM:  No  Drain: Serosanguinous   Physical Exam:  General: Pt awake/alert in no acute distress.  Tired. Eyes: PERRL, normal EOM.  Sclera clear.  No icterus Neuro: CN II-XII intact w/o focal sensory/motor deficits. Lymph: No head/neck/groin lymphadenopathy Psych: Sleeping but awakens to be oriented x 4.  Slow in answering.  Not agitated but not speaking well  HENT: Normocephalic, Mucus membranes moist.  No thrush Neck: Supple, No tracheal deviation.  No obvious thyromegaly Chest: No pain to chest wall compression.  Good respiratory excursion.  No audible wheezing CV:  Pulses intact.  Regular rhythm.  No major extremity edema MS: Normal AROM mjr joints.  No obvious deformity  Abdomen: Soft.  Nondistended.  Nontender.  Incisions well-healed without any cellulitis or drainage.  No evidence of peritonitis.  No incarcerated hernias.  Ext:   No deformity.  No mjr edema.  No cyanosis Skin:  No petechiae / purpurea.  No major sores.  Warm and dry    Results:   Cultures: No results found for this or any previous visit (from the past 720 hour(s)).  Labs: Results for orders placed or performed during the hospital encounter of 09/04/23 (from the past 48 hour(s))  Creatinine, fluid (JP Drainage)     Status: None   Collection Time: 09/07/23 10:02 AM  Result Value Ref Range   Creat, Fluid 1.3 mg/dL    Comment: (NOTE) No normal range established for this test Results should be evaluated in conjunction with serum values    Fluid Type-FCRE JP DRAINAGE     Comment: Performed at [facility], [address] [place], [place] [address]  CBC     Status: Abnormal   Collection Time: 09/07/23 11:08 AM  Result Value Ref Range   WBC 9.3 4.0 - 10.5 K/uL   RBC 3.47 (L) 3.87 - 5.11 MIL/uL   Hemoglobin 11.1 (L) 12.0 - 15.0 g/dL   HCT 62.1 (L) 30.8 - 65.7 %   MCV 101.2 (H) 80.0 - 100.0 fL    MCH 32.0 26.0 - 34.0 pg   MCHC 31.6 30.0 - 36.0 g/dL   RDW 84.6 (H) 96.2 - 95.2 %   Platelets 223 150 - 400 K/uL   nRBC 0.0 0.0 - 0.2 %    Comment: Performed at [facility], [address] [place], [place] [address]  Renal function panel     Status: Abnormal   Collection Time: 09/07/23 11:08 AM  Result Value Ref Range   Sodium 135 135 - 145 mmol/L   Potassium 4.6 3.5 - 5.1 mmol/L   Chloride 105 98 - 111 mmol/L   CO2 23 22 - 32 mmol/L   Glucose, Bld 124 (H) 70 - 99 mg/dL    Comment: Glucose reference range applies only to samples taken after fasting for at least 8 hours.   BUN 29 (H) 8 - 23 mg/dL   Creatinine, Ser 4.40 (H) 0.44 - 1.00 mg/dL   Calcium 8.6 (L) 8.9 - 10.3 mg/dL   Phosphorus 2.7 2.5 - 4.6 mg/dL   Albumin 2.7 (L) 3.5 - 5.0 g/dL   GFR, Estimated 40 (L) >60 mL/min    Comment: (NOTE) Calculated using the CKD-EPI Creatinine Equation (2021)    Anion gap 7 5 - 15    Comment: Performed at [facility], [address], [place], [place] [address]  Renal function panel     Status: Abnormal   Collection Time: 09/08/23  4:12 AM  Result Value Ref Range   Sodium 136 135 - 145 mmol/L   Potassium 5.0 3.5 - 5.1 mmol/L   Chloride 105 98 - 111 mmol/L   CO2 28 22 - 32 mmol/L   Glucose, Bld 146 (H) 70 - 99 mg/dL    Comment: Glucose reference range applies only to samples taken after fasting for at least 8 hours.   BUN 29 (H) 8 - 23 mg/dL   Creatinine, Ser 5.36 (H) 0.44 - 1.00 mg/dL   Calcium 8.9 8.9 - 64.4 mg/dL   Phosphorus 2.5 2.5 - 4.6 mg/dL   Albumin 2.7 (L) 3.5 - 5.0 g/dL   GFR, Estimated 45 (L) >60 mL/min    Comment: (NOTE) Calculated using the CKD-EPI Creatinine Equation (2021)    Anion gap 3 (L) 5 - 15    Comment: Performed at [facility], [address] [place], [place] [address]  Magnesium     Status: None   Collection Time: 09/08/23  4:12 AM  Result Value Ref Range   Magnesium 1.9 1.7 - 2.4 mg/dL     Comment: Performed at [facility], [address], [place], [place] [address]  CBC     Status: Abnormal   Collection Time: 09/08/23  4:12 AM  Result Value Ref Range   WBC 8.4 4.0 - 10.5 K/uL   RBC 3.68 (L) 3.87 - 5.11 MIL/uL   Hemoglobin 11.3 (L) 12.0 - 15.0 g/dL   HCT 13.2 44.0 - 10.2 %   MCV 101.6 (H) 80.0 - 100.0 fL   MCH 30.7 26.0 - 34.0 pg   MCHC 30.2 30.0 - 36.0 g/dL   RDW 72.5 (H) 36.6 - 44.0 %   Platelets 246 150 - 400 K/uL   nRBC 0.0 0.0 - 0.2 %    Comment: Performed at [facility], [address], [place], [place] [address]  Phosphorus     Status: None   Collection Time: 09/08/23  4:12 AM  Result Value Ref Range   Phosphorus 2.5 2.5 - 4.6 mg/dL    Comment: Performed at [facility], [address], [place], [place] [address]  TSH     Status: None   Collection Time: 09/08/23  4:12 AM  Result Value Ref Range   TSH 0.804 0.350 - 4.500 uIU/mL    Comment: Performed by a 3rd Generation assay with a functional sensitivity of <=0.01 uIU/mL. Performed at [facility], [address], [place], [place] [address]   T4, free     Status: Abnormal   Collection Time: 09/08/23  4:12 AM  Result Value Ref Range   Free T4 1.37 (H) 0.61 - 1.12 ng/dL    Comment: (NOTE) Biotin ingestion may interfere with free T4 tests. If the results are inconsistent with the TSH level, previous test results, or the clinical presentation, then consider biotin interference. If needed, order repeat testing after stopping biotin. Performed at [facility], [address] [place], [place] [address]   Vitamin B12     Status: None   Collection Time: 09/08/23  4:12 AM  Result Value Ref Range   Vitamin B-12 549 180 - 914 pg/mL    Comment: (NOTE) This assay is not validated for testing neonatal or myeloproliferative syndrome specimens for Vitamin B12 levels. Performed at [facility], [address] [place],  [place] [address]   Renal function panel     Status: Abnormal   Collection Time: 09/09/23  4:17 AM  Result Value Ref Range   Sodium 135 135 - 145 mmol/L   Potassium 5.1 3.5 - 5.1 mmol/L   Chloride 107 98 - 111 mmol/L   CO2 25 22 - 32 mmol/L   Glucose, Bld 139 (H) 70 - 99 mg/dL    Comment: Glucose reference range applies only to samples taken after fasting for at least 8 hours.   BUN 18 8 - 23 mg/dL   Creatinine, Ser 6.60 0.44 - 1.00 mg/dL   Calcium 8.7 (L) 8.9 - 10.3 mg/dL   Phosphorus 2.2 (L) 2.5 - 4.6 mg/dL   Albumin 2.4 (L) 3.5 - 5.0 g/dL   GFR, Estimated 58 (L) >60 mL/min    Comment: (NOTE) Calculated using the CKD-EPI Creatinine Equation (2021)    Anion gap 3 (L) 5 - 15    Comment: Performed at [facility], [address]  [place], [place] [address]  Magnesium     Status: None   Collection Time: 09/09/23  4:17 AM  Result Value Ref Range   Magnesium 1.9 1.7 - 2.4 mg/dL    Comment: Performed at [facility], [address] [place], [place] [address]  CBC     Status: Abnormal   Collection Time: 09/09/23  4:17 AM  Result Value Ref Range   WBC 9.3 4.0 - 10.5 K/uL   RBC 3.35 (L) 3.87 - 5.11 MIL/uL   Hemoglobin 10.5 (L) 12.0 - 15.0 g/dL   HCT 95.6 (L) 38.7 - 56.4 %   MCV 102.7 (H) 80.0 - 100.0 fL   MCH 31.3 26.0 - 34.0 pg   MCHC 30.5 30.0 - 36.0 g/dL   RDW 33.2 (H) 95.1 - 88.4 %   Platelets 233 150 - 400 K/uL   nRBC 0.0 0.0 - 0.2 %    Comment: Performed at [facility], [address], [place], [place] [address]    Imaging / Studies: No results found.  Medications / Allergies: per chart  Antibiotics: Anti-infectives (From admission, onward)    Start     Dose/Rate Route Frequency Ordered Stop   09/09/23 1000  fluconazole (DIFLUCAN) tablet 100 mg       Placed in "Followed by" Linked Group   100 mg Oral Daily 09/08/23 0749 09/15/23 0959   09/08/23 0845  fluconazole (DIFLUCAN) tablet 200 mg       Placed in "Followed  by" Linked Group   200 mg Oral  Once 09/08/23 0749 09/08/23 0851   09/04/23 2000  cefoTEtan (CEFOTAN) 2 g in sodium chloride 0.9 % 100 mL IVPB        2 g 200 mL/hr over 30 Minutes Intravenous Every 12 hours 09/04/23 1413 09/04/23 2119   09/04/23 0700  cefoTEtan (CEFOTAN) 2 g in sodium chloride 0.9 % 100 mL IVPB        2 g 200 mL/hr over 30 Minutes Intravenous On call to O.R. 09/04/23 3016 09/04/23 0851         Note: Portions of this report may have been transcribed using voice recognition software. Every effort was made to ensure accuracy; however, inadvertent computerized transcription errors may be present.   Any transcriptional errors that result from this process are unintentional.    Ardeth Sportsman, MD, FACS, MASCRS Esophageal, Gastrointestinal & Colorectal Surgery Robotic and Minimally Invasive Surgery  [facility] [address] [place], [place] [address] [phone] Fax [phone] Main  CONTACT INFORMATION: Weekday (9AM-5PM): Call [facility] main office at [phone] Weeknight (5PM-9AM) or Weekend/Holiday: Check EPIC "Web Links" tab & use "AMION" (password " [email or web address]") for General Surgery [facility] coverage  Please, DO NOT use SecureChat  (it is not reliable communication to reach operating surgeons & will lead to a delay in care).   Epic staff messaging available for outptient concerns needing 1-2 business day response.      09/09/2023  8:33 AM

## 2023-09-09 NOTE — Evaluation (Signed)
Clinical/Bedside Swallow Evaluation Patient Details  Name: Alexa Pham MRN: [id number] Date of Birth: 05-12-1930  Today's Date: 09/09/2023 Time: SLP Start Time (ACUTE ONLY): 0920 SLP Stop Time (ACUTE ONLY): 0940 SLP Time Calculation (min) (ACUTE ONLY): 20 min  Past Medical History:  Past Medical History:  Diagnosis Date   Actinic keratosis 01/28/2020   R thumb proximal nail fold    Arthritis    Asthma    Atherosclerosis of abdominal aorta (HCC)    Atypical mole 05/13/2018   R distal lat thigh   Atypical mole 09/14/2014   left ant mid thigh   Atypical mole 01/19/2014   left paraspinal mid to upper back   Atypical mole 01/19/2014   right back mid to inf scapula   Atypical mole 01/19/2014   left posterior deltoid medial   Basal cell carcinoma 01/28/2020   left of midline mid dorsum nose   Basal cell carcinoma 01/06/2018   left preauricular    Basal cell carcinoma 11/22/2016   left upper lip   Basal cell carcinoma 03/17/2015   left lateral elbow   Basal cell carcinoma 07/22/2014   R medial inferior knee   Basal cell carcinoma 07/21/2020   Left dorsum hand. Nodular pattern. Excised 08/09/2020, margins free.   Basal cell carcinoma 02/23/2021   right dorsal hand (BCC/SCC), left dorsal hand, right chin - treated with EDC   Breast cancer (HCC)    Left breast lumpectomy with radiation   Bronchiectasis (HCC)    CHF (congestive heart failure) (HCC)    Chronic anticoagulation    Coronary artery disease    Dyspnea on exertion    Family history of adverse reaction to anesthesia    son naseau   Family history of breast cancer    H/O partial mastectomy    LEFT   History of breast cancer    History of coronary artery stent placement 2021 09/04/2023   History of pneumonia    History of shingles    History of thyroidectomy, total    Hyperlipidemia    Hypertension    Hypomagnesemia    Hypothyroidism    Macular degeneration    Myocardial infarction [facility])    NSTEMI (non-ST  elevated myocardial infarction) (HCC) 02/13/2022   Pneumonia    Pulmonary hypertension (HCC)    Rectal prolapse    Renal mass, left    Right ventricular enlargement    Squamous cell carcinoma of skin 01/06/2018   right dorsum hand at ring finger MCP   Squamous cell carcinoma of skin 07/08/2017   left distal dorsum forearm    SUI (stress urinary incontinence, female)    Urge incontinence    Past Surgical History:  Past Surgical History:  Procedure Laterality Date   ABDOMINAL HYSTERECTOMY     ABDOMINAL HYSTERECTOMY     APPENDECTOMY     BREAST LUMPECTOMY     CATARACT EXTRACTION, BILATERAL     CHOLECYSTECTOMY     COLONOSCOPY WITH PROPOFOL N/A 09/03/2023   Procedure: COLONOSCOPY WITH PROPOFOL;  Surgeon: Regis Bill, MD;  Location: [facility] ENDOSCOPY;  Service: Endoscopy;  Laterality: N/A;   CORONARY STENT INTERVENTION N/A 07/26/2020   Procedure: CORONARY STENT INTERVENTION;  Surgeon: Marcina Millard, MD;  Location: [facility] INVASIVE CV LAB;  Service: Cardiovascular;  Laterality: N/A;   FLEXIBLE SIGMOIDOSCOPY N/A 09/04/2023   Procedure: FLEXIBLE SIGMOIDOSCOPY;  Surgeon: Karie Soda, MD;  Location: [facility] ORS;  Service: General;  Laterality: N/A;   LEFT HEART CATH AND CORONARY ANGIOGRAPHY Left 07/26/2020  Procedure: LEFT HEART CATH AND CORONARY ANGIOGRAPHY;  Surgeon: Lamar Blinks, MD;  Location: [facility] INVASIVE CV LAB;  Service: Cardiovascular;  Laterality: Left;   REPLACEMENT TOTAL KNEE Left    TONSILLECTOMY     TONSILLECTOMY AND ADENOIDECTOMY     HPI:  Patient is a 89 y.o. female who presented on 11/27 for scheduled rectal prolapse surgery. PMH is significant for CHF, CAD, pneumonia, thyroidectomy, hypertension, breast cancer s/p  partial mastectomy L side, and macular degeneration. ST swallow eval ordered 12/01.   Assessment / Plan / Recommendation  Clinical Impression  Patient seen by SLP for bedside swallow evaluation. Patient mentation with similar presentation as  previous date, with patient minimally alert and verbal utterances limited to single syllable. Patient was orientated to self. Examination of oral cavity revealed bolus residue of unknown origin. SLP set up in room suction and completed extensive oral care. Patient with some mild irritation on hard palate that resulted in grimacing when brushed with a swab. SLP attempted to initiate sips of Ensure via straw, however, patient refused. SLP consulted with MD and decision was made to initiate NPO status given patient's mentation at this time. Ice chips may be administered after oral care when patient is alert enough to attend to POs. Medications may be administered crushed in puree as tolerated. ST to continue following for PO trials. SLP Visit Diagnosis: Dysphagia, unspecified (R13.10)    Aspiration Risk  Mild aspiration risk    Diet Recommendation NPO;Ice chips PRN after oral care    Liquid Administration via: Spoon Medication Administration: Crushed with puree    Other  Recommendations Oral Care Recommendations: Staff/trained caregiver to provide oral care;Oral care prior to ice chip/H20;Oral care BID    Recommendations for follow up therapy are one component of a multi-disciplinary discharge planning process, led by the attending physician.  Recommendations may be updated based on patient status, additional functional criteria and insurance authorization.  Follow up Recommendations Skilled nursing-short term rehab (<3 hours/day)      Assistance Recommended at Discharge    Functional Status Assessment Patient has had a recent decline in their functional status and demonstrates the ability to make significant improvements in function in a reasonable and predictable amount of time.  Frequency and Duration min 2x/week  2 weeks       Prognosis Prognosis for improved oropharyngeal function: Good      Swallow Study   General Date of Onset: 09/04/23 HPI: Patient is a 89 y.o. female who  presented on 11/27 for schedueled rectal prolapse surgery. PMH is significant for CHF, CAD, pneumonia, thyroidectomy, hypertension, breast cancer s/p  partial mastectomy L side, and mascualr degeneration. ST swallow eval ordered 12/01 per increased patient confusion and refusal of POs. Type of Study: Bedside Swallow Evaluation Diet Prior to this Study: Dysphagia 3 (mechanical soft);Thin liquids (Level 0) Temperature Spikes Noted: No Respiratory Status: Room air History of Recent Intubation: No Behavior/Cognition: Confused;Lethargic/Drowsy;Requires cueing Oral Cavity Assessment: Within Functional Limits Oral Care Completed by SLP: Yes Oral Cavity - Dentition: Adequate natural dentition Vision: Functional for self-feeding Self-Feeding Abilities: Refused PO Patient Positioning: Upright in bed Baseline Vocal Quality: Not observed Volitional Cough: Cognitively unable to elicit Volitional Swallow: Unable to elicit    Oral/Motor/Sensory Function Overall Oral Motor/Sensory Function: Within functional limits   Ice Chips Ice chips: Not tested   Thin Liquid Thin Liquid: Not tested    Nectar Thick     Honey Thick     Puree Puree: Not tested  Solid            Marline Backbone, B.S., Speech Therapy Student   09/09/2023,10:35 AM

## 2023-09-09 NOTE — TOC Progression Note (Addendum)
Transition of Care [facility]) - Progression Note    Patient Details  Name: Alexa Pham MRN: [id number] Date of Birth: Mar 15, 1930  [facility]) CM/SW Contact  Darleene Cleaver, [place] Phone Number: 09/09/2023, 9:47 AM  Clinical Narrative:     CSW left message for [facility] to see if they can review patient's information.    9:48am CSW received message back from Ezel at [facility], [phone], she will review patient for short term rehab.  4:20pm CSW was informed that patient would prefer [facility] if possible as well.  CSW contacted Sue Lush in admissions, [phone] to see if she will review patient.  Per Sue Lush she will take a look at it tomorrow.  She currently does not have any beds available, but may later in the week.  TOC to continue to follow patient's progress throughout discharge planning.  Expected Discharge Plan: Skilled Nursing Facility Barriers to Discharge: Continued Medical Work up, SNF Pending bed offer  Expected Discharge Plan and Services In-house Referral: Clinical Social Work Discharge Planning Services: NA Post Acute Care Choice: Skilled Nursing Facility Living arrangements for the past 2 months: Single Family Home                 DME Arranged: N/A DME Agency: NA                   Social Determinants of Health (SDOH) Interventions SDOH Screenings   Food Insecurity: No Food Insecurity (09/04/2023)  Housing: Low Risk  (09/04/2023)  Transportation Needs: No Transportation Needs (09/04/2023)  Utilities: Not At Risk (09/04/2023)  Depression (PHQ2-9): Low Risk  (04/04/2023)  Financial Resource Strain: Low Risk  (10/22/2022)  Physical Activity: Insufficiently Active (10/22/2022)  Social Connections: Moderately Integrated (10/22/2022)  Stress: No Stress Concern Present (10/22/2022)  Tobacco Use: Medium Risk (09/04/2023)    Readmission Risk Interventions    09/06/2023    1:20 PM  Readmission Risk Prevention Plan   Transportation Screening Complete  PCP or Specialist Appt within 5-7 Days Complete  Home Care Screening Complete  Medication Review (RN CM) Complete

## 2023-09-10 ENCOUNTER — Inpatient Hospital Stay (HOSPITAL_COMMUNITY): Payer: Medicare Other

## 2023-09-10 DIAGNOSIS — R410 Disorientation, unspecified: Secondary | ICD-10-CM | POA: Diagnosis not present

## 2023-09-10 DIAGNOSIS — K623 Rectal prolapse: Secondary | ICD-10-CM | POA: Diagnosis not present

## 2023-09-10 LAB — RENAL FUNCTION PANEL
Albumin: 2.4 g/dL — ABNORMAL LOW (ref 3.5–5.0)
Anion gap: 5 (ref 5–15)
BUN: 13 mg/dL (ref 8–23)
CO2: 31 mmol/L (ref 22–32)
Calcium: 8 mg/dL — ABNORMAL LOW (ref 8.9–10.3)
Chloride: 103 mmol/L (ref 98–111)
Creatinine, Ser: 0.81 mg/dL (ref 0.44–1.00)
GFR, Estimated: >60 mL/min (ref >60)
Glucose, Bld: 141 mg/dL — ABNORMAL HIGH (ref 70–99)
Phosphorus: 1.8 mg/dL — ABNORMAL LOW (ref 2.5–4.6)
Potassium: 3.8 mmol/L (ref 3.5–5.1)
Sodium: 139 mmol/L (ref 135–145)

## 2023-09-10 LAB — CBC
HCT: 34.4 % — ABNORMAL LOW (ref 36.0–46.0)
Hemoglobin: 11 g/dL — ABNORMAL LOW (ref 12.0–15.0)
MCH: 31.6 pg (ref 26.0–34.0)
MCHC: 32 g/dL (ref 30.0–36.0)
MCV: 98.9 fL (ref 80.0–100.0)
Platelets: 273 10*3/uL (ref 150–400)
RBC: 3.48 MIL/uL — ABNORMAL LOW (ref 3.87–5.11)
RDW: 15.8 % — ABNORMAL HIGH (ref 11.5–15.5)
WBC: 7.9 10*3/uL (ref 4.0–10.5)
nRBC: 0 % (ref 0.0–0.2)

## 2023-09-10 LAB — MAGNESIUM: Magnesium: 1.7 mg/dL (ref 1.7–2.4)

## 2023-09-10 LAB — SURGICAL PATHOLOGY

## 2023-09-10 MED ORDER — POTASSIUM PHOSPHATES 15 MMOLE/5ML IV SOLN
30.0000 mmol | Freq: Once | INTRAVENOUS | Status: AC
  Administered 2023-09-10: 30 mmol via INTRAVENOUS
  Filled 2023-09-10: qty 10

## 2023-09-10 MED ORDER — ENSURE ENLIVE PO LIQD
237.0000 mL | Freq: Three times a day (TID) | ORAL | Status: DC
Start: 2023-09-10 — End: 2023-09-17
  Administered 2023-09-10 – 2023-09-17 (×18): 237 mL via ORAL

## 2023-09-10 MED ORDER — METOCLOPRAMIDE HCL 5 MG PO TABS
5.0000 mg | ORAL_TABLET | Freq: Every day | ORAL | Status: DC
  Administered 2023-09-11 – 2023-09-17 (×7): 5 mg via ORAL
  Filled 2023-09-10 (×7): qty 1

## 2023-09-10 MED ORDER — METOCLOPRAMIDE HCL 5 MG PO TABS
5.0000 mg | ORAL_TABLET | Freq: Every day | ORAL | Status: DC
  Administered 2023-09-10 – 2023-09-16 (×7): 5 mg via ORAL
  Filled 2023-09-10 (×7): qty 1

## 2023-09-10 MED ORDER — FOOD THICKENER (SIMPLYTHICK): 1.0000 | ORAL | Status: DC | PRN

## 2023-09-10 MED ORDER — SODIUM PHOSPHATES 45 MMOLE/15ML IV SOLN
30.0000 mmol | Freq: Once | INTRAVENOUS | Status: DC
  Filled 2023-09-10: qty 10

## 2023-09-10 NOTE — TOC Progression Note (Signed)
Transition of Care [facility]) - Progression Note    Patient Details  Name: Alexa Pham MRN: [id number] Date of Birth: 1929/11/08  Transition of Care [facility]) CM/SW Contact  Darleene Cleaver, [place] Phone Number: 09/10/2023, 6:04 PM  Clinical Narrative:     CSW attempted to contact patient's son [name] to provide bed offers.  CSW left a message on voicemail for a call back.  Expected Discharge Plan: Skilled Nursing Facility Barriers to Discharge: Continued Medical Work up, SNF Pending bed offer  Expected Discharge Plan and Services In-house Referral: Clinical Social Work Discharge Planning Services: NA Post Acute Care Choice: Skilled Nursing Facility Living arrangements for the past 2 months: Single Family Home                 DME Arranged: N/A DME Agency: NA                   Social Determinants of Health (SDOH) Interventions SDOH Screenings   Food Insecurity: No Food Insecurity (09/04/2023)  Housing: Low Risk  (09/04/2023)  Transportation Needs: No Transportation Needs (09/04/2023)  Utilities: Not At Risk (09/04/2023)  Depression (PHQ2-9): Low Risk  (04/04/2023)  Financial Resource Strain: Low Risk  (10/22/2022)  Physical Activity: Insufficiently Active (10/22/2022)  Social Connections: Moderately Integrated (10/22/2022)  Stress: No Stress Concern Present (10/22/2022)  Tobacco Use: Medium Risk (09/04/2023)    Readmission Risk Interventions    09/06/2023    1:20 PM  Readmission Risk Prevention Plan  Transportation Screening Complete  PCP or Specialist Appt within 5-7 Days Complete  Home Care Screening Complete  Medication Review (RN CM) Complete

## 2023-09-10 NOTE — Progress Notes (Signed)
Physical Therapy Treatment Patient Details Name: Alexa Pham MRN: [id number] DOB: 15-Sep-1930 Today's Date: 09/10/2023   History of Present Illness Pt is a 89 y/o female presenting on 11/27 with rectal prolapse s/p surgery 11/27. PMH includes: HTN, breast cancer s/p  partial mastectomy L side, macular degeneration, CHF, MI.    PT Comments   This 89 yo female seen today in conjunction with OT to see if we could progress mobility more due to patient being Mod A +2 for mobility yesterday. She was more alert/awake today. She was Mod A for bed mobility with sequencing cues, still needed Mod A +2 for sit<>stand and stand pivot with c/o dizziness both in sitting in and in standing.  She will continue to benefit from acute PT with follow up from continued follow up therapy, <3 hours/day at d/c   If plan is discharge home, recommend the following: A lot of help with walking and/or transfers;Assistance with cooking/housework;Help with stairs or ramp for entrance;Assist for transportation;A lot of help with bathing/dressing/bathroom;Direct supervision/assist for medications management;Direct supervision/assist for financial management   Can travel by private vehicle     No  Equipment Recommendations  None recommended by PT    Recommendations for Other Services       Precautions / Restrictions Precautions Precautions: Fall Precaution Comments: abdominal incision Restrictions Weight Bearing Restrictions: No     Mobility  Bed Mobility Overal bed mobility: Needs Assistance Bed Mobility: Rolling, Sidelying to Sit Rolling: Mod assist, Used rails Sidelying to sit: Mod assist, Used rails, HOB elevated       General bed mobility comments: sequencing cues for rolling and side lying to sit, increased time    Transfers Overall transfer level: Needs assistance Equipment used: Rolling walker (2 wheels) Transfers: Sit to/from Stand, Bed to chair/wheelchair/BSC Sit to Stand: +2 physical assistance,  +2 safety/equipment, Mod assist Stand pivot transfers: Mod assist, +2 physical assistance         General transfer comment: trouble shifting weight to take steps, increased time, VCs for sequencing, narrow BOS    Ambulation/Gait                   Stairs             Wheelchair Mobility     Tilt Bed    Modified Rankin (Stroke Patients Only)       Balance Overall balance assessment: Needs assistance Sitting-balance support: No upper extremity supported, Feet supported Sitting balance-Leahy Scale: Fair     Standing balance support: Bilateral upper extremity supported, Reliant on assistive device for balance Standing balance-Leahy Scale: Poor                              Cognition Arousal: Alert Behavior During Therapy: WFL for tasks assessed/performed Overall Cognitive Status: Within Functional Limits for tasks assessed                                 General Comments: very HOH (hearing aids, but not sure they are working)        Exercises      General Comments General comments (skin integrity, edema, etc.): VSS on RA currently with Sats 94%( pt on 5L [place] O@ earlier this am)      Pertinent Vitals/Pain Pain Assessment Pain Assessment: Faces Faces Pain Scale: Hurts a little bit Pain Location: bottom when we leaned her  back in recliner to replace the purewick Pain Descriptors / Indicators: Moaning, Grimacing Pain Intervention(s): Monitored during session, Repositioned (returned to upright in recliner)    Home Living                          Prior Function            PT Goals (current goals can now be found in the care plan section) Acute Rehab PT Goals PT Goal Formulation: With patient Time For Goal Achievement: 09/19/23 Potential to Achieve Goals: Good Progress towards PT goals: Progressing toward goals    Frequency    Min 1X/week      PT Plan      Co-evaluation PT/OT/SLP  Co-Evaluation/Treatment: Yes Reason for Co-Treatment: For patient/therapist safety;To address functional/ADL transfers PT goals addressed during session: Mobility/safety with mobility;Balance;Proper use of DME;Strengthening/ROM OT goals addressed during session: Strengthening/ROM;ADL's and self-care      AM-PAC PT "6 Clicks" Mobility   Outcome Measure  Help needed turning from your back to your side while in a flat bed without using bedrails?: A Lot Help needed moving from lying on your back to sitting on the side of a flat bed without using bedrails?: A Lot Help needed moving to and from a bed to a chair (including a wheelchair)?: Total Help needed standing up from a chair using your arms (e.g., wheelchair or bedside chair)?: Total Help needed to walk in hospital room?: Total Help needed climbing 3-5 steps with a railing? : Total 6 Click Score: 8    End of Session Equipment Utilized During Treatment: Gait belt Activity Tolerance: Patient tolerated treatment well;Patient limited by fatigue Patient left: in chair;with call bell/phone within reach;with chair alarm set;Other (comment);with nursing/sitter in room (MD) Nurse Communication: Mobility status PT Visit Diagnosis: Unsteadiness on feet (R26.81);Difficulty in walking, not elsewhere classified (R26.2);Muscle weakness (generalized) (M62.81)     Time: 8469-6295 PT Time Calculation (min) (ACUTE ONLY): 17 min  Charges:      PT General Charges $$ ACUTE PT VISIT: 1 Visit                     Zyiere Rosemond, PT  Acute Rehab Dept [facility]) [phone]  09/10/2023    [facility] 09/10/2023, 12:52 PM

## 2023-09-10 NOTE — Progress Notes (Signed)
09/10/2023  Alexa Pham [id number] 14-Dec-1929  CARE TEAM: PCP: Dale [place], MD  Outpatient Care Team: Patient Care Team: Dale Coffee, MD as PCP - General (Internal Medicine) Salena Saner, MD as Consulting Physician (Pulmonary Disease) Elige Radon, MD as Referring Physician (Cardiology) [place]ia [place] Rossie Muskrat, MD as Consulting Physician (Gastroenterology) Karie Soda, MD as Consulting Physician (General Surgery) Lamar Blinks, MD as Referring Physician (Cardiology) Vanna Scotland, MD as Consulting Physician (Urology)  Inpatient Treatment Team: Treatment Team:  Karie Soda, MD Lilyan Gilford, MD Apickup-Ot, Mervyn Skeeters, OT Montel Clock, RN Fuller Canada, NT Pershing Cox, NT Lysle Rubens, OT Kelby Aline, RN Norva Pavlov, [place] Caswell Corwin, PT   Problem List:   Principal Problem:   Complete rectal prolapse Active Problems:   Hypertension   Hypothyroidism   Hypercholesterolemia   Aortic atherosclerosis (HCC)   CAD (coronary artery disease)   Urge incontinence   Rectal prolapse   History of non-ST elevation myocardial infarction (NSTEMI)   Chronic anticoagulation   SUI (stress urinary incontinence, female)   Asthma   History of coronary artery stent placement 2021   AKI (acute kidney injury) (HCC)   Hypotension   Macrocytic anemia   Hypocalcemia   Moderate protein malnutrition (HCC)   09/04/2023  POST-OPERATIVE DIAGNOSIS:  RECTAL PROLAPSE   PROCEDURE:   ROBOTIC LOW ANTERIOR RECTOSIGMOID RESECTION WITH ANASTOMOSIS ROBOTIC RECTOPEXY FLEXIBLE SIGMOIDOSCOPY TRANSVERSUS ABDOMINIS PLANE (TAP) BLOCK - BILATERAL   Surgeons:  Karie Soda, MD  OR FINDINGS:  Very redundant sigmoid colon resting in the low pelvis with some adhesions at the left adnexa.  1/2 feet of redundant rectosigmoid colon excised.   Patient has a descending colon to proximal rectal 31 EEA stapled anastomosis that rests  12-13 cm from the anal verge  Assessment [facility] Stay = 6 days) 6 Days Post-Op    Sundowning and deconditioned - improving slowly   Plan:  Patient much more alert and much less confused today.  Workup negative for any obvious stroke.  Agree with internal medicine this is most likely sundowning with some delirium that seems to gradually improving.  Agree with restarting diet.  Reasonable to have speech therapy double check but I suspect as her mental status normalizes so were her swallowing.  Reasonable to be safe just in case.  Hypophosphatemia.  Needs to be replaced.  Discussed with Dr. Allena Katz.  He feels IV form is going to be more therapeutic so we will follow his lead/expertise   Try mobilizing get out of bed.  She is much more motivated to do that.  Plan to work with nursing team and therapies to see.  Nutritional supplements and calorie counts.  Hopefully she will be okay.  Anticoagulation regimen.  She is not actively bleeding so I think it safe to continue her Plavix back up.  Foley removed 12/2.  I am not a future fan of a pure wick but reasonable to do it nightly and then remove in the morning so she can mobilize.  I believe nursing agrees.  She needs nutrition.  Need to try and get her to eat.  Agree with dysphagia diet..  She started dysphagia 2.  See speech therapy has any concerns but I suspect as her mental status normalized so we will her swallowing.  We will see  Need to back off on her IV fluids some.  Do not want her getting fluid overloaded.  Continue follow electrolytes.  IV fluid boluses as needed.  Concern for oral flush.  Fluconazole 7-day regimen underway  Remove drain since output tapering off and remained serosanguineous with no evidence of any urine or colon leak   History of significant hypertension on numerous medications.  For now just on Coreg with PRN (as needed) breakthrough.  Defer to medicine but seems like she is getting back to her hypertensive  baseline.  Probably keep her on the mildly hypertensive side for now to avoid orthostasis and brain perfusion issues.  Dr. Allena Katz agrees.  VTE prophylaxis- SCDs, etc. back on Plavix anticoagulation.  Unfortunately with her being rather deconditioned, her fear of being at a SNF is becoming more likely.  She usually is pretty independent and active and seems to not have any major issue with the rectal prolapse/rectal resection and should improve.  However until we can get her nutrition better, she will struggle to improve  -Disposition:  Disposition:  The patient is from: Home Anticipate discharge to:  Skilled Nursing Facility (SNF) Anticipated Date of Discharge is:  December 5,2024   Barriers to discharge:  Consultant clearance & sign off  , Transitions of Care, Therapy assessment & Recommendations pending, Need for inpatient procedure/study, and Pending Clinical improvement (more likely than not)  Patient currently is NOT MEDICALLY STABLE for discharge from the hospital from a surgery standpoint.      I reviewed nursing notes, hospitalist notes, last 24 h vitals and pain scores, last 48 h intake and output, last 24 h labs and trends, and last 24 h imaging results.  I have reviewed this patient's available data, including medical history, events of note, test results, etc as part of my evaluation.   A significant portion of that time was spent in counseling. Care during the described time interval was provided by me.  This care required high  level of medical decision making.  09/10/2023    Subjective: (Chief complaint)  Patient much more alert today.  Denies any abdominal pain.  Anxious to not be alone in the room.  Taking pills and water without difficulty or choking.  Nursing team in room.  Objective:  Vital signs:  Vitals:   09/09/23 2100 09/09/23 2350 09/10/23 0500 09/10/23 0629  BP: (!) 192/95 (!) 158/78  (!) 141/73  Pulse: 88 87  83  Resp: 18   18  Temp: 98 F  (36.7 C)   98 F (36.7 C)  TempSrc: Oral   Oral  SpO2: 97%   99%  Weight:   58.6 kg   Height:        Last BM Date : 09/06/23  Intake/Output   Yesterday:  12/02 0701 - 12/03 0700 In: 1942.5 [P.O.:230; I.V.:1712.5] Out: 4510 [Urine:4350; Drains:160] This shift:  No intake/output data recorded.  Bowel function:  Flatus: YES  BM:  No  Drain: Serosanguinous   Physical Exam:  General: Pt awake/alert in no acute distress.  More alert today. Eyes: PERRL, normal EOM.  Sclera clear.  No icterus Neuro: CN II-XII intact w/o focal sensory/motor deficits. Lymph: No head/neck/groin lymphadenopathy Psych: A little slow to answering but answers appropriately.  Oriented x 4.  More anxious today but mostly consolable.    HENT: Normocephalic, Mucus membranes moist.  No thrush Neck: Supple, No tracheal deviation.  No obvious thyromegaly Chest: No pain to chest wall compression.  Good respiratory excursion.  No audible wheezing CV:  Pulses intact.  Regular rhythm.  No major extremity edema MS: Normal AROM mjr joints.  No obvious deformity  Abdomen: Soft.  Nondistended.  Nontender.  Incisions well-healed without any cellulitis or drainage.  No evidence of peritonitis.  No incarcerated hernias.  Ext:   No deformity.  No mjr edema.  No cyanosis Skin: No petechiae / purpurea.  No major sores.  Warm and dry    Results:   Cultures: No results found for this or any previous visit (from the past 720 hour(s)).  Labs: Results for orders placed or performed during the hospital encounter of 09/04/23 (from the past 48 hour(s))  Renal function panel     Status: Abnormal   Collection Time: 09/09/23  4:17 AM  Result Value Ref Range   Sodium 135 135 - 145 mmol/L   Potassium 5.1 3.5 - 5.1 mmol/L   Chloride 107 98 - 111 mmol/L   CO2 25 22 - 32 mmol/L   Glucose, Bld 139 (H) 70 - 99 mg/dL    Comment: Glucose reference range applies only to samples taken after fasting for at least 8 hours.   BUN 18  8 - 23 mg/dL   Creatinine, Ser 0.98 0.44 - 1.00 mg/dL   Calcium 8.7 (L) 8.9 - 10.3 mg/dL   Phosphorus 2.2 (L) 2.5 - 4.6 mg/dL   Albumin 2.4 (L) 3.5 - 5.0 g/dL   GFR, Estimated 58 (L) >60 mL/min    Comment: (NOTE) Calculated using the CKD-EPI Creatinine Equation (2021)    Anion gap 3 (L) 5 - 15    Comment: Performed at [facility], [address], [place], [place] [address]  Magnesium     Status: None   Collection Time: 09/09/23  4:17 AM  Result Value Ref Range   Magnesium 1.9 1.7 - 2.4 mg/dL    Comment: Performed at [facility], [address], [place], [place] [address]  CBC     Status: Abnormal   Collection Time: 09/09/23  4:17 AM  Result Value Ref Range   WBC 9.3 4.0 - 10.5 K/uL   RBC 3.35 (L) 3.87 - 5.11 MIL/uL   Hemoglobin 10.5 (L) 12.0 - 15.0 g/dL   HCT 62.1 (L) 30.8 - 65.7 %   MCV 102.7 (H) 80.0 - 100.0 fL   MCH 31.3 26.0 - 34.0 pg   MCHC 30.5 30.0 - 36.0 g/dL   RDW 84.6 (H) 96.2 - 95.2 %   Platelets 233 150 - 400 K/uL   nRBC 0.0 0.0 - 0.2 %    Comment: Performed at [facility], [address], [place], [place] [address]  Urinalysis, Complete w Microscopic -Urine, Catheterized; Indwelling urinary catheter     Status: Abnormal   Collection Time: 09/09/23  8:41 AM  Result Value Ref Range   Color, Urine STRAW (A) YELLOW   APPearance CLEAR CLEAR   Specific Gravity, Urine 1.008 1.005 - 1.030   pH 5.0 5.0 - 8.0   Glucose, UA NEGATIVE NEGATIVE mg/dL   Hgb urine dipstick NEGATIVE NEGATIVE   Bilirubin Urine NEGATIVE NEGATIVE   Ketones, ur NEGATIVE NEGATIVE mg/dL   Protein, ur NEGATIVE NEGATIVE mg/dL   Nitrite NEGATIVE NEGATIVE   Leukocytes,Ua NEGATIVE NEGATIVE   RBC / HPF 0-5 0 - 5 RBC/hpf   WBC, UA 0-5 0 - 5 WBC/hpf   Bacteria, UA NONE SEEN NONE SEEN   Squamous Epithelial / HPF 0-5 0 - 5 /HPF   Mucus PRESENT     Comment: Performed at [facility], [address], [place], [place] [address]   Prealbumin     Status:  Abnormal   Collection Time: 09/09/23 12:16 PM  Result Value Ref Range   Prealbumin 11 (L) 18 - 38 mg/dL    Comment: Performed at [facility], [address], [place], [place] [address]  Renal function panel     Status: Abnormal   Collection Time: 09/10/23  4:46 AM  Result Value Ref Range   Sodium 139 135 - 145 mmol/L   Potassium 3.8 3.5 - 5.1 mmol/L    Comment: DELTA CHECK NOTED   Chloride 103 98 - 111 mmol/L   CO2 31 22 - 32 mmol/L   Glucose, Bld 141 (H) 70 - 99 mg/dL    Comment: Glucose reference range applies only to samples taken after fasting for at least 8 hours.   BUN 13 8 - 23 mg/dL   Creatinine, Ser 8.41 0.44 - 1.00 mg/dL   Calcium 8.0 (L) 8.9 - 10.3 mg/dL   Phosphorus 1.8 (L) 2.5 - 4.6 mg/dL   Albumin 2.4 (L) 3.5 - 5.0 g/dL   GFR, Estimated >66 >06 mL/min    Comment: (NOTE) Calculated using the CKD-EPI Creatinine Equation (2021)    Anion gap 5 5 - 15    Comment: Performed at [facility], [address] [place], [place] [address]  Magnesium     Status: None   Collection Time: 09/10/23  4:46 AM  Result Value Ref Range   Magnesium 1.7 1.7 - 2.4 mg/dL    Comment: Performed at [facility], [address], [place], [place] [address]  CBC     Status: Abnormal   Collection Time: 09/10/23  4:46 AM  Result Value Ref Range   WBC 7.9 4.0 - 10.5 K/uL   RBC 3.48 (L) 3.87 - 5.11 MIL/uL   Hemoglobin 11.0 (L) 12.0 - 15.0 g/dL   HCT 35.5 (L) 73.2 - 20.2 %   MCV 98.9 80.0 - 100.0 fL   MCH 31.6 26.0 - 34.0 pg   MCHC 32.0 30.0 - 36.0 g/dL   RDW 54.2 (H) 70.6 - 23.7 %   Platelets 273 150 - 400 K/uL   nRBC 0.0 0.0 - 0.2 %    Comment: Performed at [facility], [address] [place], [place] [address]    Imaging / Studies: CT HEAD WO CONTRAST ( )  Result Date: 09/09/2023 CLINICAL DATA:  Mental status change, unknown cause EXAM: CT HEAD WITHOUT CONTRAST TECHNIQUE: Contiguous axial images  were obtained from the base of the skull through the vertex without intravenous contrast. RADIATION DOSE REDUCTION: This exam was performed according to the departmental dose-optimization program which includes automated exposure control, adjustment of the mA and/or kV according to patient size and/or use of iterative reconstruction technique. COMPARISON:  Head CT 07/11/21 FINDINGS: Brain: No hemorrhage. No hydrocephalus. No extra-axial fluid collection. No mass effect. No mass lesion. No CT evidence of an acute cortical infarct. 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 Vascular: No hyperdense vessel or unexpected calcification. Skull: Normal. Negative for fracture or focal lesion. Sinuses/Orbits: Large bilateral mastoid effusions, right-greater-than-left. There is small right-sided middle ear effusion. Paranasal sinuses are clear. Bilateral lens replacement. Orbits are otherwise unremarkable. Other: None IMPRESSION: No CT evidence of intracranial abnormality Electronically Signed   By: Lorenza Cambridge M.D.   On: 09/09/2023 11:31    Medications / Allergies: per chart  Antibiotics: Anti-infectives (From admission, onward)    Start  Dose/Rate Route Frequency Ordered Stop   09/09/23 1000  fluconazole (DIFLUCAN) tablet 100 mg       Placed in "Followed by" Linked Group   100 mg Oral Daily 09/08/23 0749 09/15/23 0959   09/08/23 0845  fluconazole (DIFLUCAN) tablet 200 mg       Placed in "Followed by" Linked Group   200 mg Oral  Once 09/08/23 0749 09/08/23 0851   09/04/23 2000  cefoTEtan (CEFOTAN) 2 g in sodium chloride 0.9 % 100 mL IVPB        2 g 200 mL/hr over 30 Minutes Intravenous Every 12 hours 09/04/23 1413 09/04/23 2119   09/04/23 0700  cefoTEtan (CEFOTAN) 2 g in sodium chloride 0.9 % 100 mL IVPB        2 g 200 mL/hr over 30 Minutes Intravenous On call to O.R. 09/04/23 9629 09/04/23 0851          Note: Portions of this report may have been transcribed using voice recognition software. Every effort was made to ensure accuracy; however, inadvertent computerized transcription errors may be present.   Any transcriptional errors that result from this process are unintentional.    Ardeth Sportsman, MD, FACS, MASCRS Esophageal, Gastrointestinal & Colorectal Surgery Robotic and Minimally Invasive Surgery  [facility] [address], [address] [place], [place] [address] [phone] Fax [phone] Main  CONTACT INFORMATION: Weekday (9AM-5PM): Call [facility] main office at [phone] Weeknight (5PM-9AM) or Weekend/Holiday: Check EPIC "Web Links" tab & use "AMION" (password " [email or web address]") for General Surgery [facility] coverage  Please, DO NOT use SecureChat  (it is not reliable communication to reach operating surgeons & will lead to a delay in care).   Epic staff messaging available for outptient concerns needing 1-2 business day response.      09/10/2023  7:30 AM

## 2023-09-10 NOTE — Progress Notes (Signed)
Occupational Therapy Treatment Patient Details Name: Rhodora Kautz MRN: [id number] DOB: 1930-05-08 Today's Date: 09/10/2023   History of present illness Pt is a 89 y/o female presenting on 11/27 with rectal prolapse s/p surgery 11/27. PMH includes: HTN, breast cancer s/p  partial mastectomy L side, macular degeneration, CHF, MI.   OT comments  This 89 yo female seen today in conjunction with PT to see if we could progress mobility more due to patient being Mod A +2 for mobility yesterday. She was more alert/awake today. She was Mod A for bed mobility with sequencing cues, still needed Mod A +2 for sit<>stand and stand pivot with c/o dizziness both in sitting in and in standing. She has 3/5 strength in Bil UEs for ADLs but just fatigues quickly with minimal activity. She will continue to benefit from acute OT with follow up from continued inpatient follow up therapy, <3 hours/day.       If plan is discharge home, recommend the following:  A lot of help with bathing/dressing/bathroom;Two people to help with walking and/or transfers;Assistance with cooking/housework;Assistance with feeding;Help with stairs or ramp for entrance;Assist for transportation;Direct supervision/assist for financial management;Direct supervision/assist for medications management   Equipment Recommendations  BSC/3in1       Precautions / Restrictions Precautions Precautions: Fall Precaution Comments: abdominal incision; dizziness Restrictions Weight Bearing Restrictions: No       Mobility Bed Mobility Overal bed mobility: Needs Assistance Bed Mobility: Rolling, Sidelying to Sit Rolling: Mod assist, Used rails Sidelying to sit: Mod assist, Used rails, HOB elevated       General bed mobility comments: sequencing cues for rolling and side lying to sit, increased time    Transfers Overall transfer level: Needs assistance Equipment used: Rolling walker (2 wheels) Transfers: Sit to/from Stand Sit to Stand: +2  physical assistance, +2 safety/equipment, Mod assist Stand pivot transfers: Mod assist, +2 physical assistance         General transfer comment: trouble shifting weight to take steps, increased time, VCs, and feet close together     Balance Overall balance assessment: Needs assistance Sitting-balance support: No upper extremity supported, Feet supported Sitting balance-Leahy Scale: Fair     Standing balance support: Bilateral upper extremity supported, Reliant on assistive device for balance Standing balance-Leahy Scale: Poor                             ADL either performed or assessed with clinical judgement   ADL Overall ADL's : Needs assistance/impaired Eating/Feeding: Total assistance Eating/Feeding Details (indicate cue type and reason): coughing with 2 out of 3 tsps of water                     Toilet Transfer: Moderate assistance;+2 for physical assistance;+2 for safety/equipment Toilet Transfer Details (indicate cue type and reason): simulated bed>recliner with RW (VCs to take steps) Toileting- Clothing Manipulation and Hygiene: Total assistance Toileting - Clothing Manipulation Details (indicate cue type and reason): Mod A for standing            Extremity/Trunk Assessment Upper Extremity Assessment Upper Extremity Assessment: Generalized weakness;Right hand dominant            Vision Baseline Vision/History: 6 Macular Degeneration;1 Wears glasses Ability to See in Adequate Light: 1 Impaired Patient Visual Report: No change from baseline            Cognition Arousal: Alert Behavior During Therapy: WFL for tasks assessed/performed Overall Cognitive Status:  Within Functional Limits for tasks assessed                                 General Comments: very HOH (hearing aids, but not sure they are working)              General Comments VSS on RA (pt had been on 5 liters earlier and now on RA with sats 94%)     Pertinent Vitals/ Pain       Pain Assessment Pain Assessment: Faces Pain Location: bottom when we leaned her back in recliner to replace the purewick Pain Descriptors / Indicators: Moaning, Grimacing Pain Intervention(s): Repositioned (put her back more upright in the recliner)         Frequency  Min 1X/week        Progress Toward Goals  OT Goals(current goals can now be found in the care plan section)  Progress towards OT goals: Progressing toward goals (slowly)  Acute Rehab OT Goals Patient Stated Goal: to get up out of bed OT Goal Formulation: With patient Time For Goal Achievement: 09/19/23 Potential to Achieve Goals: Good  Plan      Co-evaluation    PT/OT/SLP Co-Evaluation/Treatment: Yes Reason for Co-Treatment: For patient/therapist safety;To address functional/ADL transfers PT goals addressed during session: Mobility/safety with mobility;Balance;Proper use of DME;Strengthening/ROM OT goals addressed during session: Strengthening/ROM;ADL's and self-care      AM-PAC OT "6 Clicks" Daily Activity     Outcome Measure   Help from another person eating meals?: A Lot Help from another person taking care of personal grooming?: A Lot Help from another person toileting, which includes using toliet, bedpan, or urinal?: A Lot Help from another person bathing (including washing, rinsing, drying)?: A Lot Help from another person to put on and taking off regular upper body clothing?: A Lot Help from another person to put on and taking off regular lower body clothing?: Total 6 Click Score: 11    End of Session Equipment Utilized During Treatment: Gait belt;Rolling walker (2 wheels)  OT Visit Diagnosis: Unsteadiness on feet (R26.81);Other abnormalities of gait and mobility (R26.89);Muscle weakness (generalized) (M62.81);Dizziness and giddiness (R42) Pain - part of body:  (bottom)   Activity Tolerance Patient limited by fatigue   Patient Left in chair;with call  bell/phone within reach;with chair alarm set   Nurse Communication Mobility status (+2 Mod A stand pivot)        Time: 1610-9604 OT Time Calculation (min): 21 min  Charges: OT General Charges $OT Visit: 1 Visit OT Treatments $Self Care/Home Management : 8-22 mins  Lindon Romp OT Acute Rehabilitation Services Office [phone]    Evette Georges 09/10/2023, 11:39 AM

## 2023-09-10 NOTE — Telephone Encounter (Signed)
Called and spoke to Mr Vanderkolk Molly Maduro).  Pt is doing some better today.  ST - swallowing evaluation.  Changed to dysphagia II diet. PT working with her.  Not as confused.  Son updated. Will also keep me posted.

## 2023-09-10 NOTE — Progress Notes (Addendum)
[facility] Consultation Progress Note  Patient: Alexa Pham OZH:[id number]   PCP: Dale , MD DOB: 07/06/1930   DOA: 09/04/2023   DOS: 09/10/2023   Date of Service: the patient was seen and examined on 09/10/2023 Primary service: Karie Soda, MD   Brief hospital course: PMH of asthma, CAD, CHF, hypothyroidism, HLD, HTN, hard of hearing, pulmonary hypertension presented for robotic low anterior rectosigmoid resection with anastomosis. Medicine service was consulted for hypotension as well as AKI.   Assessment and Plan: AKI. Baseline serum creatinine 0.9. On admission serum creatinine 1.5 worsened to 1.7. Now improving to normal values. IV fluids now on hold. Avoid hypotension.  Avoid nephrotoxic medications.  Hypophosphatemia. Recommend to replace with IV K-Phos.  Especially in the setting of patient's poor p.o. intake.  Acute delirium, likely sundowning. Undiagnosed dementia. Patient appears to be withdrawn,.  Was lethargic on 12/1.  Was unable to sleep on 11/30 night as well as 12/1 night. Hard of hearing therefore it takes effort for her to understand questions. CT of the head unremarkable. Metabolic workup including B12 629, free T4 1.37 , TSH 0.8, folate 18.9, UA unremarkable. No focal deficit at the time of my evaluation. No asterixis as well. Delirium precaution.  Avoid psychotropic medications.  Minimize narcotics.  Dysphagia Oral thrush Speech was consulted, due to lethargy and inability to follow commands recommended n.p.o. but if she perks up recommended to advance her to dysphagia 2 diet. Will advance to dysphagia 2 diet.  Speech therapy recommended MBS although currently on hold.  Patient appears to be coughing on thin liquids therefore now on nectar thick liquids diet.  Thickener ordered.  Ensure was also ordered and the patient is actually doing well with Ensure. Continue Diflucan for oral thrush.  History of hypertension but now with  hypotension. On amlodipine 2.5 mg at home as well as Coreg 6.25 mg twice daily at home with Lasix and irbesartan. Postoperatively blood pressure was low. Coreg is continued.  Now blood pressure stable. Monitor for now.  Persistent nausea. Likely postop.  On scheduled Reglan.  Monitor for improvement.   CAD. On aspirin.  And Plavix. Currently on aspirin. Resumption of Plavix per surgery.   HLD. On Lipitor.   Hypothyroidism. On Synthroid 150 mcg. TSH 2.56 in June. TSH and free T4 adequate.  Would not change dose for now.   Prediabetes. Hemoglobin A1c 5.8. Continue sensitive sliding scale.   Hypoxia. Nocturnal hypoxia. Bilateral pleural effusion Patient was on 5 L of oxygen at the time of my evaluation on 12/3 in the morning. Chest x-ray shows volume overload. Later in the day patient was able to come off of the oxygen to room air 94%. Does have some basal crackles. Would not be able to follow incentive spirometry recommendation. Would not initiate diuresis for now given oxygen improvement. Family member who was visiting reports that the patient is using "a lot of" oxygen at home home at nighttime. Will use 2 L for now. If oxygenation worsens further may require repeat chest x-ray and if pleural effusion persists, may require thoracentesis.  We will continue to follow the patient.    Subjective: Patient more alert and oriented.  Able to follow commands.  Consistently complaining of nausea as a cause of her inability to eat.  No fever no chills.  Was on 4 L early in the morning but later was able to saturate 94% on room air.  Friend who is visiting reports that she is actually using oxygen at nighttime.  Objective: Vitals:   09/10/23 0629 09/10/23 0732 09/10/23 1119 09/10/23 1318  BP: (!) 141/73   (!) 186/92  Pulse: 83   81  Resp: 18   16  Temp: 98 F (36.7 C)   98 F (36.7 C)  TempSrc: Oral   Oral  SpO2: 99% 98% 95% 97%  Weight:      Height:        Basal  crackles. S1-S2 present.  No murmur. Bowel sound present.  Mildly tender diffusely. No edema. Alert and awake and oriented.  Able to tell me her name.  Able to follow commands. Family Communication: Friend at bedside.  Data Reviewed: Since last encounter, pertinent lab results CBC and BMP   . I have ordered test including CBC and BMP  . I have discussed pt's care plan and test results with general surgery  . I have ordered imaging chest x-ray  .   Author: Lynden Oxford, MD  [facility] 09/10/2023  7:57 PM  To reach On-call, Look up on care teams to locate the [facility] team or provider name and reach out to them via secure chat or [email or web address] Between 7PM-7AM, please contact night-coverage. If you still have difficulty reaching the attending provider, please page the [facility] (Director on Call) for [facility] on amion for assistance.

## 2023-09-10 NOTE — Progress Notes (Signed)
Speech Language Pathology Treatment: Dysphagia  Patient Details Name: Alexa Pham MRN: [id number] DOB: [birth year] Today's Date: 09/10/2023 Time: 1191-4782 SLP Time Calculation (min) (ACUTE ONLY): 31 min  Assessment / Plan / Recommendation Clinical Impression  Patient is demonstrating clinical indications of dysphagia with concern for retention and aspiration most notably of thin water via cup and straw. She performs multiple subswallows across all boluses concerning for her pharyngeal and/or cervical esophageal retention. 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 Potential contribution to dysphagia.  Prior chest imaging from 10/16/2022 showed right middle lobe volume loss question bronchiectasis postinfectious and inflammatory. 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   Patient provided with various intake including room temperature water via straw via cup via teaspoon, applesauce bolus, and small bite of cracker. Reflexive cough is significantly weak concerning for ability to protect airway if she is indeed aspirating.   Patient without attempts to masticate solids and second bolus of applesauce retained in an anterior oral cavity and moaning with patient expectorating applesauce and SLP removing graham cracker.   Patient does endorse premorbid issues with swallowing but does not expand on information. At this time recommend modified diet to clear liquids via teaspoon only pending medical plan. Messaged Dr. Michaell Cowing, Dr. Allena Katz and added Thelma Barge RN to communication.   HPI        SLP Plan  Continue with current plan of care;Other (Comment) (defer to surgery and hospitalist for def plan)      Recommendations for follow up therapy are one component of a multi-disciplinary discharge planning process, led by the attending physician.  Recommendations may be updated based on patient status, additional functional criteria and  insurance authorization.    Recommendations  Diet recommendations: Thin liquid (clears via tsp) Liquids provided via: Teaspoon Medication Administration: Other (Comment) Compensations: Slow rate;Small sips/bites Postural Changes and/or Swallow Maneuvers: Seated upright 90 degrees;Upright 30-60 min after meal                  Oral care BID     Dysphagia, pharyngeal phase (R13.13);Dysphagia, pharyngoesophageal phase (R13.14);Dysphagia, unspecified (R13.10)     Continue with current plan of care;Other (Comment) (defer to surgery and hospitalist for def plan)    Alexa Infante, MS [facility] SLP Acute Rehab Services Office [phone]  Chales Abrahams  09/10/2023, 10:19 AM

## 2023-09-11 DIAGNOSIS — K623 Rectal prolapse: Secondary | ICD-10-CM | POA: Diagnosis not present

## 2023-09-11 LAB — RENAL FUNCTION PANEL
Albumin: 2.4 g/dL — ABNORMAL LOW (ref 3.5–5.0)
Anion gap: 4 — ABNORMAL LOW (ref 5–15)
BUN: 15 mg/dL (ref 8–23)
CO2: 34 mmol/L — ABNORMAL HIGH (ref 22–32)
Calcium: 8.3 mg/dL — ABNORMAL LOW (ref 8.9–10.3)
Chloride: 99 mmol/L (ref 98–111)
Creatinine, Ser: 0.74 mg/dL (ref 0.44–1.00)
GFR, Estimated: >60 mL/min (ref >60)
Glucose, Bld: 116 mg/dL — ABNORMAL HIGH (ref 70–99)
Phosphorus: 3.1 mg/dL (ref 2.5–4.6)
Potassium: 4.3 mmol/L (ref 3.5–5.1)
Sodium: 137 mmol/L (ref 135–145)

## 2023-09-11 LAB — CBC
HCT: 34.6 % — ABNORMAL LOW (ref 36.0–46.0)
Hemoglobin: 10.9 g/dL — ABNORMAL LOW (ref 12.0–15.0)
MCH: 31.1 pg (ref 26.0–34.0)
MCHC: 31.5 g/dL (ref 30.0–36.0)
MCV: 98.9 fL (ref 80.0–100.0)
Platelets: 293 10*3/uL (ref 150–400)
RBC: 3.5 MIL/uL — ABNORMAL LOW (ref 3.87–5.11)
RDW: 15.5 % (ref 11.5–15.5)
WBC: 7.6 10*3/uL (ref 4.0–10.5)
nRBC: 0 % (ref 0.0–0.2)

## 2023-09-11 MED ORDER — HYDROCERIN EX CREA
TOPICAL_CREAM | Freq: Two times a day (BID) | CUTANEOUS | Status: DC
  Administered 2023-09-15: 1 via TOPICAL
  Filled 2023-09-11: qty 113

## 2023-09-11 MED ORDER — ACETAMINOPHEN 500 MG PO TABS
500.0000 mg | ORAL_TABLET | Freq: Three times a day (TID) | ORAL | Status: DC
  Administered 2023-09-11 – 2023-09-16 (×18): 500 mg via ORAL
  Filled 2023-09-11 (×18): qty 1

## 2023-09-11 MED ORDER — CALCIUM POLYCARBOPHIL 625 MG PO TABS
625.0000 mg | ORAL_TABLET | Freq: Two times a day (BID) | ORAL | Status: DC
  Administered 2023-09-11 – 2023-09-17 (×12): 625 mg via ORAL
  Filled 2023-09-11 (×12): qty 1

## 2023-09-11 NOTE — Progress Notes (Signed)
09/11/2023  Alexa Pham [id number] 1930-05-07  CARE TEAM: PCP: Dale Chester, MD  Outpatient Care Team: Patient Care Team: Dale [place], MD as PCP - General (Internal Medicine) Salena Saner, MD as Consulting Physician (Pulmonary Disease) Elige Radon, MD as Referring Physician (Cardiology) Mia [place] Rossie Muskrat, MD as Consulting Physician (Gastroenterology) Karie Soda, MD as Consulting Physician (General Surgery) Lamar Blinks, MD as Referring Physician (Cardiology) Vanna Scotland, MD as Consulting Physician (Urology)  Inpatient Treatment Team: Treatment Team:  Karie Soda, MD Lilyan Gilford, MD Montel Clock, RN Kelby Aline, RN Misty Stanley, NT Norva Pavlov, [facility]   Problem List:   Principal Problem:   Complete rectal prolapse Active Problems:   Hypertension   Hypothyroidism   Hypercholesterolemia   Aortic atherosclerosis (HCC)   CAD (coronary artery disease)   Urge incontinence   Rectal prolapse   History of non-ST elevation myocardial infarction (NSTEMI)   Chronic anticoagulation   SUI (stress urinary incontinence, female)   Asthma   History of coronary artery stent placement 2021   AKI (acute kidney injury) (HCC)   Hypotension   Macrocytic anemia   Hypocalcemia   Moderate protein malnutrition (HCC)   Delirium   Hypophosphatemia   09/04/2023  POST-OPERATIVE DIAGNOSIS:  RECTAL PROLAPSE   PROCEDURE:   ROBOTIC LOW ANTERIOR RECTOSIGMOID RESECTION WITH ANASTOMOSIS ROBOTIC RECTOPEXY FLEXIBLE SIGMOIDOSCOPY TRANSVERSUS ABDOMINIS PLANE (TAP) BLOCK - BILATERAL   Surgeons:  Karie Soda, MD  OR FINDINGS:  Very redundant sigmoid colon resting in the low pelvis with some adhesions at the left adnexa.  1/2 feet of redundant rectosigmoid colon excised.   Patient has a descending colon to proximal rectal 31 EEA stapled anastomosis that rests 12-13 cm from the anal verge  Assessment [facility] Stay = 7  days) 7 Days Post-Op    Sundowning and deconditioned - improving slowly   Plan:  Patient more alert today.  Improving.  Workup negative for any obvious stroke.  Agree with internal medicine this is most likely sundowning with some delirium that seems to gradually improving.  I would give her a solid diet since she did not have any major aspiration issues.  I need her to get nutrition.  Reasonable to have this followed which therapist  Try mobilizing get out of bed.  She is much more motivated to do that.  Plan to work with nursing team and therapies to see.  Nutritional supplements and calorie counts.  Hopefully she will be okay.  Anticoagulation regimen.  She is not actively bleeding so I think it safe to continue her Plavix back up.  Hypophosphatemia.  Improved with IV replacement.  Follow-up.  Foley removed 12/2.  I am not a future fan of a pure wick but reasonable to do it nightly and then remove in the morning so she can mobilize.  I believe nursing agrees.  Need to back off on her IV fluids some.  Do not want her getting fluid overloaded.  Continue follow electrolytes.  IV fluid boluses as needed.  Concern for oral flush.  Fluconazole 7-day regimen underway  Remove drain since output tapering off and remained serosanguineous with no evidence of any urine or colon leak   History of significant hypertension on numerous medications.  Seems like she is getting hypertension again.  Defer to medicine.  I think they are trying to see if they can slowly bring things on board.  With their prior orthostasis hedging on keeping her on the high side  VTE  prophylaxis- SCDs, etc. back on Plavix anticoagulation.  Unfortunately with her being rather deconditioned, her fear of being at a SNF is becoming more likely.  She usually is pretty independent and active and seems to not have any major issue with the rectal prolapse/rectal resection and should improve.  However until we can get her  nutrition better, she will struggle to improve  -Disposition:  Disposition:  The patient is from: Home Anticipate discharge to:  Skilled Nursing Facility (SNF) Anticipated Date of Discharge is:  December 6,2024   Barriers to discharge:  Consultant clearance & sign off  , Transitions of Care, Therapy assessment & Recommendations pending, Need for inpatient procedure/study, and Pending Clinical improvement (more likely than not)  Patient currently is NOT MEDICALLY STABLE for discharge from the hospital from a surgery standpoint.      I reviewed nursing notes, hospitalist notes, last 24 h vitals and pain scores, last 48 h intake and output, last 24 h labs and trends, and last 24 h imaging results.  I have reviewed this patient's available data, including medical history, events of note, test results, etc as part of my evaluation.   A significant portion of that time was spent in counseling. Care during the described time interval was provided by me.  This care required high  level of medical decision making.  09/11/2023    Subjective: (Chief complaint)  Patient had much better today.  Drinking Ensure with better appetite.  No nausea or vomiting.  Nursing feels more reassured.  Starting to mobilize more.  Objective:  Vital signs:  Vitals:   09/10/23 2050 09/10/23 2148 09/11/23 0500 09/11/23 0547  BP: (!) 150/69   (!) 144/68  Pulse: 78   62  Resp: 16   16  Temp: (!) 97.4 F (36.3 C)   98.1 F (36.7 C)  TempSrc: Oral   Oral  SpO2: 100% 99%  100%  Weight:   54.9 kg   Height:        Last BM Date : 09/10/23  Intake/Output   Yesterday:  12/03 0701 - 12/04 0700 In: 524.5 [P.O.:90; I.V.:3; IV Piggyback:431.5] Out: 3270 [Urine:3250; Drains:20] This shift:  No intake/output data recorded.  Bowel function:  Flatus: YES  BM:  YES  Drain: (No drain)   Physical Exam:  General: Pt sleeps but awakens gradually to be awake/alert in no acute distress.  Tired but  nontoxic Eyes: PERRL, normal EOM.  Sclera clear.  No icterus Neuro: CN II-XII intact w/o focal sensory/motor deficits. Lymph: No head/neck/groin lymphadenopathy Psych: No delirium or confusion today.  Not anxious..  Oriented x 4.   HENT: Normocephalic, Mucus membranes moist.  No thrush Neck: Supple, No tracheal deviation.  No obvious thyromegaly Chest: No pain to chest wall compression.  Good respiratory excursion.  No audible wheezing CV:  Pulses intact.  Regular rhythm.  No major extremity edema MS: Normal AROM mjr joints.  No obvious deformity  Abdomen: Soft.  Nondistended.  Nontender.  Incisions well-healed without any cellulitis or drainage.  No evidence of peritonitis.  No incarcerated hernias.  Ext:   No deformity.  No mjr edema.  No cyanosis Skin: No petechiae / purpurea.  No major sores.  Warm and dry    Results:   Cultures: No results found for this or any previous visit (from the past 720 hour(s)).  Labs: Results for orders placed or performed during the hospital encounter of 09/04/23 (from the past 48 hour(s))  Urinalysis, Complete w Microscopic -Urine, Catheterized;  Indwelling urinary catheter     Status: Abnormal   Collection Time: 09/09/23  8:41 AM  Result Value Ref Range   Color, Urine STRAW (A) YELLOW   APPearance CLEAR CLEAR   Specific Gravity, Urine 1.008 1.005 - 1.030   pH 5.0 5.0 - 8.0   Glucose, UA NEGATIVE NEGATIVE mg/dL   Hgb urine dipstick NEGATIVE NEGATIVE   Bilirubin Urine NEGATIVE NEGATIVE   Ketones, ur NEGATIVE NEGATIVE mg/dL   Protein, ur NEGATIVE NEGATIVE mg/dL   Nitrite NEGATIVE NEGATIVE   Leukocytes,Ua NEGATIVE NEGATIVE   RBC / HPF 0-5 0 - 5 RBC/hpf   WBC, UA 0-5 0 - 5 WBC/hpf   Bacteria, UA NONE SEEN NONE SEEN   Squamous Epithelial / HPF 0-5 0 - 5 /HPF   Mucus PRESENT     Comment: Performed at [facility], [address] [place], [place] [address]  Prealbumin     Status: Abnormal   Collection Time: 09/09/23 12:16 PM   Result Value Ref Range   Prealbumin 11 (L) 18 - 38 mg/dL    Comment: Performed at [facility], [address], [place], [place] [address]  Renal function panel     Status: Abnormal   Collection Time: 09/10/23  4:46 AM  Result Value Ref Range   Sodium 139 135 - 145 mmol/L   Potassium 3.8 3.5 - 5.1 mmol/L    Comment: DELTA CHECK NOTED   Chloride 103 98 - 111 mmol/L   CO2 31 22 - 32 mmol/L   Glucose, Bld 141 (H) 70 - 99 mg/dL    Comment: Glucose reference range applies only to samples taken after fasting for at least 8 hours.   BUN 13 8 - 23 mg/dL   Creatinine, Ser 4.01 0.44 - 1.00 mg/dL   Calcium 8.0 (L) 8.9 - 10.3 mg/dL   Phosphorus 1.8 (L) 2.5 - 4.6 mg/dL   Albumin 2.4 (L) 3.5 - 5.0 g/dL   GFR, Estimated >02 >72 mL/min    Comment: (NOTE) Calculated using the CKD-EPI Creatinine Equation (2021)    Anion gap 5 5 - 15    Comment: Performed at [facility], [address], [place], [place] [address]  Magnesium     Status: None   Collection Time: 09/10/23  4:46 AM  Result Value Ref Range   Magnesium 1.7 1.7 - 2.4 mg/dL    Comment: Performed at [facility], [address] [place], [place] [address]  CBC     Status: Abnormal   Collection Time: 09/10/23  4:46 AM  Result Value Ref Range   WBC 7.9 4.0 - 10.5 K/uL   RBC 3.48 (L) 3.87 - 5.11 MIL/uL   Hemoglobin 11.0 (L) 12.0 - 15.0 g/dL   HCT 42.5 (L) 95.6 - 38.7 %   MCV 98.9 80.0 - 100.0 fL   MCH 31.6 26.0 - 34.0 pg   MCHC 32.0 30.0 - 36.0 g/dL   RDW 56.4 (H) 33.2 - 95.1 %   Platelets 273 150 - 400 K/uL   nRBC 0.0 0.0 - 0.2 %    Comment: Performed at [facility], [address], [place], [place] [address]  Renal function panel     Status: Abnormal   Collection Time: 09/11/23  4:45 AM  Result Value Ref Range   Sodium 137 135 - 145 mmol/L   Potassium 4.3 3.5 - 5.1 mmol/L   Chloride 99 98 - 111 mmol/L   CO2 34 (  H) 22 - 32 mmol/L   Glucose, Bld 116 (H) 70 - 99  mg/dL    Comment: Glucose reference range applies only to samples taken after fasting for at least 8 hours.   BUN 15 8 - 23 mg/dL   Creatinine, Ser 0.86 0.44 - 1.00 mg/dL   Calcium 8.3 (L) 8.9 - 10.3 mg/dL   Phosphorus 3.1 2.5 - 4.6 mg/dL   Albumin 2.4 (L) 3.5 - 5.0 g/dL   GFR, Estimated >57 >84 mL/min    Comment: (NOTE) Calculated using the CKD-EPI Creatinine Equation (2021)    Anion gap 4 (L) 5 - 15    Comment: Performed at [facility], [address], [place], [place] [address]  CBC     Status: Abnormal   Collection Time: 09/11/23  4:45 AM  Result Value Ref Range   WBC 7.6 4.0 - 10.5 K/uL   RBC 3.50 (L) 3.87 - 5.11 MIL/uL   Hemoglobin 10.9 (L) 12.0 - 15.0 g/dL   HCT 52.8 (L) 41.3 - 24.4 %   MCV 98.9 80.0 - 100.0 fL   MCH 31.1 26.0 - 34.0 pg   MCHC 31.5 30.0 - 36.0 g/dL   RDW 01.0 27.2 - 53.6 %   Platelets 293 150 - 400 K/uL   nRBC 0.0 0.0 - 0.2 %    Comment: Performed at [facility], [address], [place], [place] [address]    Imaging / Studies: DG CHEST PORT 1 VIEW  Result Date: 09/10/2023 CLINICAL DATA:  89 year old female with shortness of breath. EXAM: PORTABLE CHEST 1 VIEW COMPARISON:  Chest radiographs 02/13/2022. CT Abdomen and Pelvis 11/02/2022. FINDINGS: Portable AP semi upright view at 0832 hours. Right greater than left lung base veiling opacity, dense retrocardiac opacity. No superimposed pneumothorax. No convincing pulmonary edema. Right middle lobe bronchiectasis and consolidation demonstrated on the January CT and probably progressed from last year. No definite air bronchograms now. No acute osseous abnormality identified. Chronic left axillary surgical clips. Stable visible bowel gas. IMPRESSION: Veiling bilateral lung base opacity most suggestive of pleural effusions. Recommend confirmation with cross-sectional imaging prior to attempted thoracentesis. Chronic right middle lobe Bronchiectasis and consolidation by CT this  year. Electronically Signed   By: Odessa Fleming M.D.   On: 09/10/2023 11:44   CT HEAD WO CONTRAST ( )  Result Date: 09/09/2023 CLINICAL DATA:  Mental status change, unknown cause EXAM: CT HEAD WITHOUT CONTRAST TECHNIQUE: Contiguous axial images were obtained from the base of the skull through the vertex without intravenous contrast. RADIATION DOSE REDUCTION: This exam was performed according to the departmental dose-optimization program which includes automated exposure control, adjustment of the mA and/or kV according to patient size and/or use of iterative reconstruction technique. COMPARISON:  Head CT 07/11/21 FINDINGS: Brain: No hemorrhage. No hydrocephalus. No extra-axial fluid collection. No mass effect. No mass lesion. No CT evidence of an acute cortical infarct. 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 Vascular: No hyperdense vessel or unexpected calcification. Skull: Normal. Negative for fracture or focal lesion. Sinuses/Orbits: Large bilateral mastoid effusions, right-greater-than-left. There is small right-sided middle ear effusion. Paranasal sinuses are clear. Bilateral lens replacement. Orbits are otherwise unremarkable. Other: None IMPRESSION: No CT evidence of intracranial abnormality Electronically Signed   By: Lorenza Cambridge M.D.   On: 09/09/2023 11:31    Medications / Allergies: per chart  Antibiotics: Anti-infectives (From admission, onward)    Start  Dose/Rate Route Frequency Ordered Stop   09/09/23 1000  fluconazole (DIFLUCAN) tablet 100 mg       Placed in "Followed by" Linked Group   100 mg Oral Daily 09/08/23 0749 09/15/23 0959   09/08/23 0845  fluconazole (DIFLUCAN) tablet 200 mg       Placed in "Followed by" Linked Group   200 mg Oral  Once 09/08/23 0749 09/08/23 0851   09/04/23 2000  cefoTEtan (CEFOTAN) 2 g in sodium chloride 0.9 % 100 mL IVPB        2 g 200 mL/hr over 30 Minutes  Intravenous Every 12 hours 09/04/23 1413 09/04/23 2119   09/04/23 0700  cefoTEtan (CEFOTAN) 2 g in sodium chloride 0.9 % 100 mL IVPB        2 g 200 mL/hr over 30 Minutes Intravenous On call to O.R. 09/04/23 9811 09/04/23 0851         Note: Portions of this report may have been transcribed using voice recognition software. Every effort was made to ensure accuracy; however, inadvertent computerized transcription errors may be present.   Any transcriptional errors that result from this process are unintentional.    Ardeth Sportsman, MD, FACS, MASCRS Esophageal, Gastrointestinal & Colorectal Surgery Robotic and Minimally Invasive Surgery  [facility] [address] [place], [place] [address] [phone] Fax [phone] Main  CONTACT INFORMATION: Weekday (9AM-5PM): Call [facility] main office at [phone] Weeknight (5PM-9AM) or Weekend/Holiday: Check EPIC "Web Links" tab & use "AMION" (password " [email or web address]") for General Surgery [facility] coverage  Please, DO NOT use SecureChat  (it is not reliable communication to reach operating surgeons & will lead to a delay in care).   Epic staff messaging available for outptient concerns needing 1-2 business day response.      09/11/2023  7:37 AM

## 2023-09-11 NOTE — Progress Notes (Signed)
PROGRESS NOTE    Alexa Pham  HYQ:[id number] DOB: 18-Jun-1930 DOA: 09/04/2023 PCP: Dale Barrett, MD   Brief Narrative:  PMH of asthma, CAD, CHF, hypothyroidism, HLD, HTN, hard of hearing, pulmonary hypertension presented for robotic low anterior rectosigmoid resection with anastomosis. Medicine service was consulted for hypotension as well as AKI.  Assessment & Plan:   Principal Problem:   Complete rectal prolapse Active Problems:   Hypertension   Hypothyroidism   Hypercholesterolemia   Aortic atherosclerosis (HCC)   CAD (coronary artery disease)   Urge incontinence   Rectal prolapse   History of non-ST elevation myocardial infarction (NSTEMI)   Chronic anticoagulation   SUI (stress urinary incontinence, female)   Asthma   History of coronary artery stent placement 2021   AKI (acute kidney injury) (HCC)   Hypotension   Macrocytic anemia   Hypocalcemia   Moderate protein malnutrition (HCC)   Delirium   Hypophosphatemia  AKI. Baseline serum creatinine 0.9. On admission serum creatinine 1.5 worsened to 1.7 but improved and back to normal now. Avoid nephrotoxic medications.   Hypophosphatemia. Resolved.   Acute delirium, likely sundowning. Undiagnosed dementia. Fully alert and oriented.  Resolved. 1. Avoid benzodiazepines, antihistamines, anticholinergics, and minimize opiate use as these may worsen delirium. 2: Assess, prevent and manage pain as lack of treatment can result in delirium.  3: Provide appropriate lighting and clear signage; a clock and calendar should be easily visible to the patient. 4: Monitor environmental factors. Reduce light and noise at night (close shades, turn off lights, turn off TV, ect). Correct any alterations in sleep cycle. 5: Reorient the patient to person, place, time and situation on each encounter.  6: Correct sensory deficits if possible (replace eye glasses, hearing aids, ect). 7: Avoid restraints if able. Severely delirious  patients benefit from constant observation by a sitter.   Dysphagia Oral thrush Speech was consulted, due to lethargy and inability to follow commands recommended n.p.o. but patient is more alert now and I noticed that she has been advanced to regular diet by primary service/general surgery.   History of hypertension but now with hypotension. On amlodipine 2.5 mg as well as Coreg 6.25 mg twice daily at home with Lasix and irbesartan.  Currently only on Coreg 3.125 mg p.o. twice daily and blood pressure control.   Persistent nausea. Likely postop.  Resolved.   CAD. Currently on aspirin and Plavix.   HLD. On Lipitor.   Hypothyroidism. On Synthroid 150 mcg. TSH 2.56 in June. TSH and free T4 adequate.  Would not change dose for now.   Prediabetes. Hemoglobin A1c 5.8. Continue sensitive sliding scale.    Hypoxia. Nocturnal hypoxia. Bilateral small pleural effusion Currently asymptomatic and saturating well over 94% on room air.  DVT prophylaxis: enoxaparin (LOVENOX) injection 30 mg Start: 09/06/23 0800 SCD's Start: 09/04/23 1414   Code Status: Full Code  Family Communication:  None present at bedside.  Plan of care discussed with patient in length and he/she verbalized understanding and agreed with it.  Status is: Inpatient Remains inpatient appropriate because: Disposition per primary service/general surgery   Estimated body mass index is 19.54 kg/m as calculated from the following:   Height as of this encounter: 5\' 6"  (1.676 m).   Weight as of this encounter: 54.9 kg.    Nutritional Assessment: Body mass index is 19.54 kg/m.Marland Kitchen Seen by dietician.  I agree with the assessment and plan as outlined below: Nutrition Status: Nutrition Problem: Inadequate oral intake Etiology: poor appetite Signs/Symptoms: per patient/family report,  NPO status Interventions: Refer to RD note for recommendations  . Skin Assessment: I have examined the patient's skin and I agree with the  wound assessment as performed by the wound care RN as outlined below:    Consultants:  [facility] hospitalist  Procedures:  As above  Antimicrobials:  Anti-infectives (From admission, onward)    Start     Dose/Rate Route Frequency Ordered Stop   09/09/23 1000  fluconazole (DIFLUCAN) tablet 100 mg       Placed in "Followed by" Linked Group   100 mg Oral Daily 09/08/23 0749 09/15/23 0959   09/08/23 0845  fluconazole (DIFLUCAN) tablet 200 mg       Placed in "Followed by" Linked Group   200 mg Oral  Once 09/08/23 0749 09/08/23 0851   09/04/23 2000  cefoTEtan (CEFOTAN) 2 g in sodium chloride 0.9 % 100 mL IVPB        2 g 200 mL/hr over 30 Minutes Intravenous Every 12 hours 09/04/23 1413 09/04/23 2119   09/04/23 0700  cefoTEtan (CEFOTAN) 2 g in sodium chloride 0.9 % 100 mL IVPB        2 g 200 mL/hr over 30 Minutes Intravenous On call to O.R. 09/04/23 1610 09/04/23 0851         Subjective: Patient seen and examined.  Sitting in the recliner.  She is very hard of hearing but today she is fully alert and oriented and she has no complaints.  Objective: Vitals:   09/10/23 2148 09/11/23 0500 09/11/23 0547 09/11/23 0831  BP:   (!) 144/68   Pulse:   62   Resp:   16   Temp:   98.1 F (36.7 C)   TempSrc:   Oral   SpO2: 99%  100% 99%  Weight:  54.9 kg    Height:        Intake/Output Summary (Last 24 hours) at 09/11/2023 1235 Last data filed at 09/11/2023 1016 Gross per 24 hour  Intake 704.5 ml  Output 2700 ml  Net -1995.5 ml   Filed Weights   09/09/23 0500 09/10/23 0500 09/11/23 0500  Weight: 60.3 kg 58.6 kg 54.9 kg    Examination:  General exam: Appears calm and comfortable  Respiratory system: Clear to auscultation. Respiratory effort normal. Cardiovascular system: S1 & S2 heard, RRR. No JVD, murmurs, rubs, gallops or clicks. No pedal edema. Gastrointestinal system: Abdomen is nondistended, soft and nontender. No organomegaly or masses felt. Normal bowel sounds  heard. Central nervous system: Alert and oriented. No focal neurological deficits. Extremities: Symmetric 5 x 5 power. Skin: No rashes, lesions or ulcers  Data Reviewed: I have personally reviewed following labs and imaging studies  CBC: Recent Labs  Lab 09/05/23 1900 09/07/23 1108 09/08/23 0412 09/09/23 0417 09/10/23 0446 09/11/23 0445  WBC 8.2 9.3 8.4 9.3 7.9 7.6  NEUTROABS 6.5  --   --   --   --   --   HGB 10.2* 11.1* 11.3* 10.5* 11.0* 10.9*  HCT 33.3* 35.1* 37.4 34.4* 34.4* 34.6*  MCV 102.5* 101.2* 101.6* 102.7* 98.9 98.9  PLT 215 223 246 233 273 293   Basic Metabolic Panel: Recent Labs  Lab 09/05/23 0452 09/05/23 1900 09/07/23 1108 09/08/23 0412 09/09/23 0417 09/10/23 0446 09/11/23 0445  NA 136   < > 135 136 135 139 137  K 3.8   < > 4.6 5.0 5.1 3.8 4.3  CL 103   < > 105 105 107 103 99  CO2 26   < >  23 28 25 31  34*  GLUCOSE 124*   < > 124* 146* 139* 141* 116*  BUN 29*   < > 29* 29* 18 13 15   CREATININE 1.54*   < > 1.27* 1.16* 0.93 0.81 0.74  CALCIUM 6.8*   < > 8.6* 8.9 8.7* 8.0* 8.3*  MG 2.0  --   --  1.9 1.9 1.7  --   PHOS  --   --  2.7 2.5  2.5 2.2* 1.8* 3.1   < > = values in this interval not displayed.   GFR: Estimated Creatinine Clearance: 39.7 mL/min (by C-G formula based on SCr of 0.74 mg/dL). Liver Function Tests: Recent Labs  Lab 09/06/23 0818 09/07/23 1108 09/08/23 0412 09/09/23 0417 09/10/23 0446 09/11/23 0445  AST 42*  --   --   --   --   --   ALT 25  --   --   --   --   --   ALKPHOS 47  --   --   --   --   --   BILITOT 0.6  --   --   --   --   --   PROT 5.5*  --   --   --   --   --   ALBUMIN 2.8* 2.7* 2.7* 2.4* 2.4* 2.4*   No results for input(s): "LIPASE", "AMYLASE" in the last 168 hours. No results for input(s): "AMMONIA" in the last 168 hours. Coagulation Profile: No results for input(s): "INR", "PROTIME" in the last 168 hours. Cardiac Enzymes: No results for input(s): "CKTOTAL", "CKMB", "CKMBINDEX", "TROPONINI" in the last 168  hours. BNP (last 3 results) No results for input(s): "PROBNP" in the last 8760 hours. HbA1C: No results for input(s): "HGBA1C" in the last 72 hours. CBG: No results for input(s): "GLUCAP" in the last 168 hours. Lipid Profile: No results for input(s): "CHOL", "HDL", "LDLCALC", "TRIG", "CHOLHDL", "LDLDIRECT" in the last 72 hours. Thyroid Function Tests: No results for input(s): "TSH", "T4TOTAL", "FREET4", "T3FREE", "THYROIDAB" in the last 72 hours. Anemia Panel: No results for input(s): "VITAMINB12", "FOLATE", "FERRITIN", "TIBC", "IRON", "RETICCTPCT" in the last 72 hours. Sepsis Labs: No results for input(s): "PROCALCITON", "LATICACIDVEN" in the last 168 hours.  No results found for this or any previous visit (from the past 240 hour(s)).   Radiology Studies: DG CHEST PORT 1 VIEW  Result Date: 09/10/2023 CLINICAL DATA:  89 year old female with shortness of breath. EXAM: PORTABLE CHEST 1 VIEW COMPARISON:  Chest radiographs 02/13/2022. CT Abdomen and Pelvis 11/02/2022. FINDINGS: Portable AP semi upright view at 0832 hours. Right greater than left lung base veiling opacity, dense retrocardiac opacity. No superimposed pneumothorax. No convincing pulmonary edema. Right middle lobe bronchiectasis and consolidation demonstrated on the January CT and probably progressed from last year. No definite air bronchograms now. No acute osseous abnormality identified. Chronic left axillary surgical clips. Stable visible bowel gas. IMPRESSION: Veiling bilateral lung base opacity most suggestive of pleural effusions. Recommend confirmation with cross-sectional imaging prior to attempted thoracentesis. Chronic right middle lobe Bronchiectasis and consolidation by CT this year. Electronically Signed   By: Odessa Fleming M.D.   On: 09/10/2023 11:44    Scheduled Meds:  albuterol  2.5 mg Nebulization BID   aspirin EC  81 mg Oral Daily   carvedilol  3.125 mg Oral BID WC   clopidogrel  75 mg Oral Daily   enoxaparin  (LOVENOX) injection  30 mg Subcutaneous Q24H   feeding supplement  237 mL Oral TID BM  fluconazole  100 mg Oral Daily   hydrocerin   Topical BID   levothyroxine  150 mcg Oral Daily   metoCLOPramide  5 mg Oral Q breakfast   metoCLOPramide  5 mg Oral Q supper   simethicone  40 mg Oral QID   sodium chloride flush  3 mL Intravenous Q12H   Continuous Infusions:  sodium chloride       LOS: 7 days   Hughie Closs, MD [facility]  09/11/2023, 12:35 PM   *Please note that this is a verbal dictation therefore any spelling or grammatical errors are due to the "Dragon [employer] One" system interpretation.  Please page via Amion and do not message via secure chat for urgent patient care matters. Secure chat can be used for non urgent patient care matters.  How to contact the [place] Attending or Consulting provider 7A - 7P or covering provider during after hours 7P -7A, for this patient?  Check the care team in [facility] and look for a) attending/consulting TRH provider listed and b) the [facility] team listed. Page or secure chat 7A-7P. Log into [email or web address] and use [facility]'s universal password to access. If you do not have the password, please contact the hospital operator. Locate the [facility] provider you are looking for under [facility] and page to a number that you can be directly reached. If you still have difficulty reaching the provider, please page the [facility] (Director on Call) for the Hospitalists listed on amion for assistance.

## 2023-09-12 ENCOUNTER — Inpatient Hospital Stay (HOSPITAL_COMMUNITY): Payer: Medicare Other

## 2023-09-12 DIAGNOSIS — K623 Rectal prolapse: Secondary | ICD-10-CM | POA: Diagnosis not present

## 2023-09-12 LAB — CBC WITH DIFFERENTIAL/PLATELET
Abs Immature Granulocytes: 0.09 10*3/uL — ABNORMAL HIGH (ref 0.00–0.07)
Basophils Absolute: 0 10*3/uL (ref 0.0–0.1)
Basophils Relative: 0 %
Eosinophils Absolute: 0.2 10*3/uL (ref 0.0–0.5)
Eosinophils Relative: 3 %
HCT: 35.7 % — ABNORMAL LOW (ref 36.0–46.0)
Hemoglobin: 11.3 g/dL — ABNORMAL LOW (ref 12.0–15.0)
Immature Granulocytes: 1 %
Lymphocytes Relative: 15 %
Lymphs Abs: 1.1 10*3/uL (ref 0.7–4.0)
MCH: 31.2 pg (ref 26.0–34.0)
MCHC: 31.7 g/dL (ref 30.0–36.0)
MCV: 98.6 fL (ref 80.0–100.0)
Monocytes Absolute: 0.8 10*3/uL (ref 0.1–1.0)
Monocytes Relative: 12 %
Neutro Abs: 4.7 10*3/uL (ref 1.7–7.7)
Neutrophils Relative %: 69 %
Platelets: 331 10*3/uL (ref 150–400)
RBC: 3.62 MIL/uL — ABNORMAL LOW (ref 3.87–5.11)
RDW: 15.7 % — ABNORMAL HIGH (ref 11.5–15.5)
WBC: 6.9 10*3/uL (ref 4.0–10.5)
nRBC: 0 % (ref 0.0–0.2)

## 2023-09-12 LAB — BRAIN NATRIURETIC PEPTIDE: B Natriuretic Peptide: 159.1 pg/mL — ABNORMAL HIGH (ref 0.0–100.0)

## 2023-09-12 LAB — BASIC METABOLIC PANEL
Anion gap: 7 (ref 5–15)
BUN: 19 mg/dL (ref 8–23)
CO2: 36 mmol/L — ABNORMAL HIGH (ref 22–32)
Calcium: 8.6 mg/dL — ABNORMAL LOW (ref 8.9–10.3)
Chloride: 95 mmol/L — ABNORMAL LOW (ref 98–111)
Creatinine, Ser: 0.84 mg/dL (ref 0.44–1.00)
GFR, Estimated: >60 mL/min (ref >60)
Glucose, Bld: 83 mg/dL (ref 70–99)
Potassium: 4.3 mmol/L (ref 3.5–5.1)
Sodium: 138 mmol/L (ref 135–145)

## 2023-09-12 MED ORDER — FUROSEMIDE 10 MG/ML IJ SOLN
40.0000 mg | Freq: Four times a day (QID) | INTRAMUSCULAR | Status: AC
Start: 2023-09-12 — End: 2023-09-12
  Administered 2023-09-12 (×2): 40 mg via INTRAVENOUS
  Filled 2023-09-12 (×2): qty 4

## 2023-09-12 NOTE — TOC Progression Note (Addendum)
Transition of Care [facility]) - Progression Note    Patient Details  Name: Alexa Pham MRN: [id number] Date of Birth: Feb 16, 1930  Transition [facility]) CM/SW Contact  Adrian Prows, RN Phone Number: 09/12/2023, 1:40 PM  Clinical Narrative:    [facility] Shown Admissions at at [facility]; she says facility does not have any bed availability this week; also per MD's note, pt not medically ready for d/c.   Expected Discharge Plan: Skilled Nursing Facility Barriers to Discharge: Continued Medical Work up, SNF Pending bed offer  Expected Discharge Plan and Services In-house Referral: Clinical Social Work Discharge Planning Services: NA Post Acute Care Choice: Skilled Nursing Facility Living arrangements for the past 2 months: Single Family Home                 DME Arranged: N/A DME Agency: NA                   Social Determinants of Health (SDOH) Interventions SDOH Screenings   Food Insecurity: No Food Insecurity (09/04/2023)  Housing: Low Risk  (09/04/2023)  Transportation Needs: No Transportation Needs (09/04/2023)  Utilities: Not At Risk (09/04/2023)  Depression (PHQ2-9): Low Risk  (04/04/2023)  Financial Resource Strain: Low Risk  (10/22/2022)  Physical Activity: Insufficiently Active (10/22/2022)  Social Connections: Moderately Integrated (10/22/2022)  Stress: No Stress Concern Present (10/22/2022)  Tobacco Use: Medium Risk (09/04/2023)    Readmission Risk Interventions    09/06/2023    1:20 PM  Readmission Risk Prevention Plan  Transportation Screening Complete  PCP or Specialist Appt within 5-7 Days Complete  Home Care Screening Complete  Medication Review (RN CM) Complete

## 2023-09-12 NOTE — Progress Notes (Signed)
Physical Therapy Treatment Patient Details Name: Alexa Pham MRN: [id number] DOB: May 17, 1930 Today's Date: 09/12/2023   History of Present Illness Pt is a 89 y/o female presenting on 11/27 with rectal prolapse s/p surgery 11/27. PMH includes: HTN, breast cancer s/p  partial mastectomy L side, macular degeneration, CHF, MI.    PT Comments  The patient is alert and  ready to mobilize to recliner. Patient demonstrate  improved strength and assisting self. Patient may tolerate initiating ambulation with EVA walker  next visit.Patient will benefit from continued inpatient follow up therapy, <3 hours/day      If plan is discharge home, recommend the following: A lot of help with walking and/or transfers;Assistance with cooking/housework;Help with stairs or ramp for entrance;Assist for transportation;A lot of help with bathing/dressing/bathroom;Direct supervision/assist for medications management;Direct supervision/assist for financial management   Can travel by private vehicle     No  Equipment Recommendations  None recommended by PT    Recommendations for Other Services       Precautions / Restrictions Precautions Precautions: Fall Precaution Comments: abdominal incision Restrictions Weight Bearing Restrictions: No     Mobility  Bed Mobility   Bed Mobility: Rolling, Sidelying to Sit Rolling: Min assist, Used rails Sidelying to sit: Min assist       General bed mobility comments: sequencing cues for rolling and side lying to sit, increased time, assist with trunk, able to self scoot to bed edge with encouragement    Transfers Overall transfer level: Needs assistance Equipment used: Rolling walker (2 wheels) Transfers: Sit to/from Stand, Bed to chair/wheelchair/BSC Sit to Stand: +2 physical assistance, +2 safety/equipment, Mod assist           General transfer comment: trouble shifting weight to take steps, increased time, VCs for sequencing, narrow BOS, did  take a  step with  each foot to move to recliner    Ambulation/Gait                   Stairs             Wheelchair Mobility     Tilt Bed    Modified Rankin (Stroke Patients Only)       Balance   Sitting-balance support: No upper extremity supported, Feet supported Sitting balance-Leahy Scale: Fair     Standing balance support: Bilateral upper extremity supported, Reliant on assistive device for balance Standing balance-Leahy Scale: Poor                              Cognition Arousal: Alert Behavior During Therapy: WFL for tasks assessed/performed Overall Cognitive Status: Difficult to assess                                 General Comments: very HOH (hearing aids, but not sure they are working), follows directions with increased time        Exercises      General Comments        Pertinent Vitals/Pain Pain Assessment Pain Assessment: No/denies pain    Home Living                          Prior Function            PT Goals (current goals can now be found in the care plan section) Progress towards PT goals: Progressing toward goals  Frequency    Min 1X/week      PT Plan      Co-evaluation              AM-PAC PT "6 Clicks" Mobility   Outcome Measure  Help needed turning from your back to your side while in a flat bed without using bedrails?: A Lot Help needed moving from lying on your back to sitting on the side of a flat bed without using bedrails?: A Lot Help needed moving to and from a bed to a chair (including a wheelchair)?: A Lot Help needed standing up from a chair using your arms (e.g., wheelchair or bedside chair)?: A Lot Help needed to walk in hospital room?: Total Help needed climbing 3-5 steps with a railing? : Total 6 Click Score: 10    End of Session Equipment Utilized During Treatment: Gait belt Activity Tolerance: Patient tolerated treatment well;Patient limited by  fatigue Patient left: in chair;with call bell/phone within reach;with chair alarm set;Other (comment) Nurse Communication: Mobility status PT Visit Diagnosis: Unsteadiness on feet (R26.81);Difficulty in walking, not elsewhere classified (R26.2);Muscle weakness (generalized) (M62.81)     Time: 6962-9528 PT Time Calculation (min) (ACUTE ONLY): 17 min  Charges:    $Therapeutic Activity: 8-22 mins PT General Charges $$ ACUTE PT VISIT: 1 Visit                    Blanchard Kelch PT Acute Rehabilitation Services Office [phone] Weekend pager-[phone]   Rada Hay 09/12/2023, 1:18 PM

## 2023-09-12 NOTE — Progress Notes (Signed)
09/12/2023  Alexa Pham [id number] July 05, 1930  CARE TEAM: PCP: Dale Lindsay, MD  Outpatient Care Team: Patient Care Team: Dale [place], MD as PCP - General (Internal Medicine) Salena Saner, MD as Consulting Physician (Pulmonary Disease) Elige Radon, MD as Referring Physician (Cardiology) Mia [place] Rossie Muskrat, MD as Consulting Physician (Gastroenterology) Karie Soda, MD as Consulting Physician (General Surgery) Lamar Blinks, MD as Referring Physician (Cardiology) Vanna Scotland, MD as Consulting Physician (Urology)  Inpatient Treatment Team: Treatment Team:  Karie Soda, MD Lilyan Gilford, MD Hassel Neth, CCC-SLP Fuller Canada, NT Wilford Corner, RN Pershing Cox, NT   Problem List:   Principal Problem:   Complete rectal prolapse Active Problems:   Hypertension   Hypothyroidism   Hypercholesterolemia   Aortic atherosclerosis (HCC)   CAD (coronary artery disease)   Urge incontinence   Rectal prolapse   History of non-ST elevation myocardial infarction (NSTEMI)   Chronic anticoagulation   SUI (stress urinary incontinence, female)   Asthma   History of coronary artery stent placement 2021   AKI (acute kidney injury) (HCC)   Hypotension   Macrocytic anemia   Hypocalcemia   Moderate protein malnutrition (HCC)   Delirium   Hypophosphatemia   09/04/2023  POST-OPERATIVE DIAGNOSIS:  RECTAL PROLAPSE   PROCEDURE:   ROBOTIC LOW ANTERIOR RECTOSIGMOID RESECTION WITH ANASTOMOSIS ROBOTIC RECTOPEXY FLEXIBLE SIGMOIDOSCOPY TRANSVERSUS ABDOMINIS PLANE (TAP) BLOCK - BILATERAL   Surgeons:  Karie Soda, MD  OR FINDINGS:  Very redundant sigmoid colon resting in the low pelvis with some adhesions at the left adnexa.  1/2 feet of redundant rectosigmoid colon excised.   Patient has a descending colon to proximal rectal 31 EEA stapled anastomosis that rests 12-13 cm from the anal verge  Assessment [facility] Stay = 8  days) 8 Days Post-Op    Sundowning and deconditioned - improving slowly   Plan:  Patient MS slowly improving.  Workup negative for any obvious stroke.  Agree with internal medicine this is most likely sundowning with some delirium that seems to gradually improving.  Solid diet.  Calorie counts.  Supple shakes.  Hopefully she can have enough oral nutrition to continue to heal   Mobilizing get out of bed.  Plan to work with nursing team and therapies to see.  Anticoagulation regimen.  She is not actively bleeding back on home Plavix.  Hypophosphatemia.  Improved with IV replacement.  Follow-up.  Foley removed 12/2. UA neg  Try to keep on the dry side.  Still on oxygen.  I wonder if you benefit from diuresis since she sometimes does that at home.  She seems to be naturally self diuresing so defer to medicine.  Concern for oral flush.  Fluconazole 7-day regimen underway  History of significant hypertension on numerous medications.  Seems like she is getting hypertension again.  Defer to medicine.  I think they are trying to see if they can slowly bring things on board.  With their prior orthostasis hedging on keeping her on the high side  VTE prophylaxis- SCDs, etc. back on Plavix anticoagulation.  Unfortunately with her being rather deconditioned, her fear of being at a SNF is becoming more likely.  She usually is pretty independent and active and seems to not have any major issue with the rectal prolapse/rectal resection and should improve.  However until we can get her nutrition better, she will struggle to improve  -Disposition:  Disposition:  The patient is from: Home Anticipate discharge to:  Skilled Nursing Facility (SNF)  Anticipated Date of Discharge is:  December 6,2024   Barriers to discharge:  Consultant clearance & sign off  , Transitions of Care, Therapy assessment & Recommendations pending, Need for inpatient procedure/study, and Pending Clinical improvement (more  likely than not)  Patient currently is NOT MEDICALLY STABLE for discharge from the hospital from a surgery standpoint.      I reviewed nursing notes, hospitalist notes, last 24 h vitals and pain scores, last 48 h intake and output, last 24 h labs and trends, and last 24 h imaging results.  I have reviewed this patient's available data, including medical history, events of note, test results, etc as part of my evaluation.   A significant portion of that time was spent in counseling. Care during the described time interval was provided by me.  This care required high  level of medical decision making.  09/12/2023    Subjective: (Chief complaint)  Slept better  Less confused.  Not much of an appetite but trying to take solid food with no nausea.  No major aspiration events.  Did get out of bed but needed moderate help  Objective:  Vital signs:  Vitals:   09/11/23 2117 09/11/23 2153 09/12/23 0500 09/12/23 0618  BP:  (!) 127/54  (!) 141/67  Pulse:  75  75  Resp:  14  16  Temp:  98.1 F (36.7 C)  97.7 F (36.5 C)  TempSrc:  Oral  Oral  SpO2: 98% 100%  100%  Weight:   51.4 kg   Height:        Last BM Date : 09/11/23  Intake/Output   Yesterday:  12/04 0701 - 12/05 0700 In: 423 [P.O.:420; I.V.:3] Out: 1300 [Urine:1300] This shift:  Total I/O In: 0  Out: 400 [Urine:400]  Bowel function:  Flatus: YES  BM:  YES  Drain: (No drain)   Physical Exam:  General: Pt sleeps but awakens gradually to be awake/alert in no acute distress.  Nontoxic Eyes: PERRL, normal EOM.  Sclera clear.  No icterus Neuro: CN II-XII intact w/o focal sensory/motor deficits. Lymph: No head/neck/groin lymphadenopathy Psych: No delirium or confusion today.  Not anxious..  Oriented x 4.   HENT: Normocephalic, Mucus membranes moist.  No thrush Neck: Supple, No tracheal deviation.  No obvious thyromegaly Chest: No pain to chest wall compression.  Good respiratory excursion.  No audible  wheezing CV:  Pulses intact.  Regular rhythm.  No major extremity edema MS: Normal AROM mjr joints.  No obvious deformity  Abdomen: Soft.  Nondistended.  Nontender.  Incisions well-healed without any cellulitis or drainage.  No evidence of peritonitis.  No incarcerated hernias.  Ext:   No deformity.  No mjr edema.  No cyanosis Skin: No petechiae / purpurea.  No major sores.  Warm and dry    Results:   Cultures: No results found for this or any previous visit (from the past 720 hour(s)).  Labs: Results for orders placed or performed during the hospital encounter of 09/04/23 (from the past 48 hour(s))  Renal function panel     Status: Abnormal   Collection Time: 09/11/23  4:45 AM  Result Value Ref Range   Sodium 137 135 - 145 mmol/L   Potassium 4.3 3.5 - 5.1 mmol/L   Chloride 99 98 - 111 mmol/L   CO2 34 (H) 22 - 32 mmol/L   Glucose, Bld 116 (H) 70 - 99 mg/dL    Comment: Glucose reference range applies only to samples taken after fasting for  at least 8 hours.   BUN 15 8 - 23 mg/dL   Creatinine, Ser 8.65 0.44 - 1.00 mg/dL   Calcium 8.3 (L) 8.9 - 10.3 mg/dL   Phosphorus 3.1 2.5 - 4.6 mg/dL   Albumin 2.4 (L) 3.5 - 5.0 g/dL   GFR, Estimated >78 >46 mL/min    Comment: (NOTE) Calculated using the CKD-EPI Creatinine Equation (2021)    Anion gap 4 (L) 5 - 15    Comment: Performed at [facility], [address], [place], [place] [address]  CBC     Status: Abnormal   Collection Time: 09/11/23  4:45 AM  Result Value Ref Range   WBC 7.6 4.0 - 10.5 K/uL   RBC 3.50 (L) 3.87 - 5.11 MIL/uL   Hemoglobin 10.9 (L) 12.0 - 15.0 g/dL   HCT 28.4 (L) 13.2 - 44.0 %   MCV 98.9 80.0 - 100.0 fL   MCH 31.1 26.0 - 34.0 pg   MCHC 31.5 30.0 - 36.0 g/dL   RDW 10.2 72.5 - 36.6 %   Platelets 293 150 - 400 K/uL   nRBC 0.0 0.0 - 0.2 %    Comment: Performed at [facility], [address] [place], [place] [address]    Imaging / Studies: DG CHEST PORT 1  VIEW  Result Date: 09/10/2023 CLINICAL DATA:  89 year old female with shortness of breath. EXAM: PORTABLE CHEST 1 VIEW COMPARISON:  Chest radiographs 02/13/2022. CT Abdomen and Pelvis 11/02/2022. FINDINGS: Portable AP semi upright view at 0832 hours. Right greater than left lung base veiling opacity, dense retrocardiac opacity. No superimposed pneumothorax. No convincing pulmonary edema. Right middle lobe bronchiectasis and consolidation demonstrated on the January CT and probably progressed from last year. No definite air bronchograms now. No acute osseous abnormality identified. Chronic left axillary surgical clips. Stable visible bowel gas. IMPRESSION: Veiling bilateral lung base opacity most suggestive of pleural effusions. Recommend confirmation with cross-sectional imaging prior to attempted thoracentesis. Chronic right middle lobe Bronchiectasis and consolidation by CT this year. Electronically Signed   By: Odessa Fleming M.D.   On: 09/10/2023 11:44    Medications / Allergies: per chart  Antibiotics: Anti-infectives (From admission, onward)    Start     Dose/Rate Route Frequency Ordered Stop   09/09/23 1000  fluconazole (DIFLUCAN) tablet 100 mg       Placed in "Followed by" Linked Group   100 mg Oral Daily 09/08/23 0749 09/15/23 0959   09/08/23 0845  fluconazole (DIFLUCAN) tablet 200 mg       Placed in "Followed by" Linked Group   200 mg Oral  Once 09/08/23 0749 09/08/23 0851   09/04/23 2000  cefoTEtan (CEFOTAN) 2 g in sodium chloride 0.9 % 100 mL IVPB        2 g 200 mL/hr over 30 Minutes Intravenous Every 12 hours 09/04/23 1413 09/04/23 2119   09/04/23 0700  cefoTEtan (CEFOTAN) 2 g in sodium chloride 0.9 % 100 mL IVPB        2 g 200 mL/hr over 30 Minutes Intravenous On call to O.R. 09/04/23 7425 09/04/23 0851         Note: Portions of this report may have been transcribed using voice recognition software. Every effort was made to ensure accuracy; however, inadvertent computerized  transcription errors may be present.   Any transcriptional errors that result from this process are unintentional.    Ardeth Sportsman, MD, FACS, MASCRS Esophageal, Gastrointestinal & Colorectal Surgery Robotic and Minimally Invasive  Surgery  [facility] [address], [address] [place], [place] [address] [phone] Fax [phone] Main  CONTACT INFORMATION: Weekday (9AM-5PM): Call [facility] main office at [phone] Weeknight (5PM-9AM) or Weekend/Holiday: Check EPIC "Web Links" tab & use "AMION" (password " [email or web address]") for General Surgery [facility] coverage  Please, DO NOT use SecureChat  (it is not reliable communication to reach operating surgeons & will lead to a delay in care).   Epic staff messaging available for outptient concerns needing 1-2 business day response.      09/12/2023  6:30 AM

## 2023-09-12 NOTE — Progress Notes (Signed)
Speech Language Pathology Treatment: Dysphagia  Patient Details Name: Alexa Pham MRN: [id number] DOB: 17-May-1930 Today's Date: 09/12/2023 Time: 7253-6644 SLP Time Calculation (min) (ACUTE ONLY): 13 min  Assessment / Plan / Recommendation Clinical Impression  Pt seen for dysphagia f/u tx session with pt exhibiting intermittent delayed cough during intake of thin liquids and subswallows present during solid consumption suggesting potential for esophageal dysphagia.  Belching noted post intake which could contribute to esophageal component of swallowing.  Pt with decreased awareness of deficits as she stated "I don't have trouble swallowing" after it was brought to her attention re: weak cough response and intermittent delayed cough with liquids.  May consider objective assessment to r/o esophageal component/UES involvement prior to d/c or complete as an OP prn.  Esophageal precautions given to pt and discussed during session with encouragement to consume smaller bites/sips d/t overt s/sx of aspiration present during session.  ST will continue to f/u acutely for dysphagia management/tx.    HPI HPI: Patient is a 89 y.o. female who presented on 11/27 for schedueled rectal prolapse surgery. PMH is significant for CHF, CAD, pneumonia, thyroidectomy, hypertension, breast cancer s/p partial mastectomy L side, and mascualr degeneration. ST swallow eval ordered 12/01 per increased patient confusion and refusal of POs; advanced to clear liquids but esophageal dysphagia symptoms reported.  Subswallows indicated during tx session.  ST f/u for diet tolerance as diet was advanced to Regular/thin via MD.      SLP Plan  Continue with current plan of care      Recommendations for follow up therapy are one component of a multi-disciplinary discharge planning process, led by the attending physician.  Recommendations may be updated based on patient status, additional functional criteria and insurance authorization.     Recommendations  Diet recommendations: Regular;Thin liquid Liquids provided via: Cup;No straw Medication Administration: Whole meds with puree Supervision: Patient able to self feed;Intermittent supervision to cue for compensatory strategies Compensations: Small sips/bites;Slow rate;Follow solids with liquid;Clear throat intermittently Postural Changes and/or Swallow Maneuvers: Seated upright 90 degrees;Upright 30-60 min after meal                  Oral care BID   Intermittent Supervision/Assistance Dysphagia, oropharyngeal phase (R13.12)     Continue with current plan of care     Pat Marjory Meints,M.S.,CCC-SLP  09/12/2023, 12:43 PM

## 2023-09-12 NOTE — Progress Notes (Addendum)
Nutrition Follow-up  DOCUMENTATION CODES:   Severe malnutrition in context of chronic illness, Underweight  INTERVENTION:   -48 hour Calorie Count per surgery -started 12/4 dinner  - Please document % intake of all foods, drinks, and nutrition supplements patient consumes on meal tickets and place in envelope on patient's door.  - If patient skips/refuses meals please document 0% for that meal.   -Ensure Plus High Protein po TID, each supplement provides 350 kcal and 20 grams of protein.  -Magic cup TID with meals, each supplement provides 290 kcal and 9 grams of protein   NUTRITION DIAGNOSIS:   Severe Malnutrition related to chronic illness as evidenced by severe fat depletion, severe muscle depletion.  GOAL:   Patient will meet greater than or equal to 90% of their needs  MONITOR:   PO intake, Supplement acceptance, Labs, Weight trends, I & O's  REASON FOR ASSESSMENT:   Consult Calorie Count  ASSESSMENT:   89 y.o. PMH of asthma, CAD, CHF, hypothyroidism, HLD, HTN, hard of hearing, pulmonary hypertension presented for robotic low anterior rectosigmoid resection with anastomosis.  11/27: admitted, s/p rectopexy, resection of rectosigmoid colon   Patient in room, very HOH but was able to hear me up close to her right ear. Pt states she doesn't feel good today but she is trying to eat. Really likes Ensure, chocolate flavor so will continue these. Willing to try [employer] with meals, will add.  Pt denies any issues with chewing or swallowing. States foods taste bland to her.  Day 1 Calorie Count results: 12/5 B: 50% = 140 kcals, 1g protein 12/5 L: 100 kcals, 4g protein 12/4 D: none ordered Supplements: 2 Ensures so far: 700 kcals, 40g protein Total: 940 kcals, 45g protein   Admission weight: 110 lbs Current weight: 113 lbs  Medications: Reglan, Fibercon  Labs reviewed.  NUTRITION - FOCUSED PHYSICAL EXAM:  Flowsheet Row Most Recent Value  Orbital Region Severe  depletion  Upper Arm Region Severe depletion  Thoracic and Lumbar Region Severe depletion  Buccal Region Severe depletion  Temple Region Severe depletion  Clavicle Bone Region Severe depletion  Clavicle and Acromion Bone Region Severe depletion  Scapular Bone Region Severe depletion  Dorsal Hand Severe depletion  Patellar Region Severe depletion  Anterior Thigh Region Severe depletion  Posterior Calf Region Severe depletion  Edema (RD Assessment) None  Hair Reviewed  Eyes Reviewed  Mouth Reviewed  Skin Reviewed  Nails Reviewed       Diet Order:   Diet Order             Diet regular Room service appropriate? Yes; Fluid consistency: Thin  Diet effective now           Diet - low sodium heart healthy                   EDUCATION NEEDS:   No education needs have been identified at this time  Skin:  Skin Assessment: Skin Integrity Issues: Skin Integrity Issues:: Incisions Incisions: 11/27 abdomen  Last BM:  12/4 -type 6  Height:   Ht Readings from Last 1 Encounters:  09/04/23 5\' 6"  (1.676 m)    Weight:   Wt Readings from Last 1 Encounters:  09/12/23 51.4 kg    BMI:  Body mass index is 18.29 kg/m.  Estimated Nutritional Needs:   Kcal:  1450-1650  Protein:  65-75g  Fluid:  1.6L/day   Alexa Franco, MS, RD, LDN Inpatient Clinical Dietitian Contact via Secure chat

## 2023-09-12 NOTE — Progress Notes (Signed)
Physical Therapy Treatment Patient Details Name: Alexa Pham MRN: [id number] DOB: 12-24-1929 Today's Date: 09/12/2023   History of Present Illness Pt is a 89 y/o female presenting on 11/27 with rectal prolapse s/p surgery 11/27. PMH includes: HTN, breast cancer s/p  partial mastectomy L side, macular degeneration, CHF, MI.    PT Comments  The patient ios seated in recliner, moaning, appears weaker than when transferred to recliner. Patient assisted to stand and pivot back to bed with max support. Continue PT and progress ambulation as tolerated.   If plan is discharge home, recommend the following: A lot of help with walking and/or transfers;Assistance with cooking/housework;Help with stairs or ramp for entrance;Assist for transportation;A lot of help with bathing/dressing/bathroom;Direct supervision/assist for medications management;Direct supervision/assist for financial management   Can travel by private vehicle     No  Equipment Recommendations  None recommended by PT    Recommendations for Other Services       Precautions / Restrictions Precautions Precautions: Fall Precaution Comments: abdominal incision     Mobility  Bed Mobility   Bed Mobility: Sit to Supine    General bed mobility comments: assist legs onto bed    Transfers  Transfers: Sit to/from Stand, Bed to chair/wheelchair/BSC Sit to Stand: Mod assist           General transfer comment: bear hug to stand and pivot to bed, patient appears weaker than last session.    Ambulation/Gait                   Stairs             Wheelchair Mobility     Tilt Bed    Modified Rankin (Stroke Patients Only)       Balance Overall balance assessment: Needs assistance Sitting-balance support: No upper extremity supported, Feet supported Sitting balance-Leahy Scale: Fair     Standing balance support: Bilateral upper extremity supported, Reliant on assistive device for balance Standing  balance-Leahy Scale: Poor                              Cognition Arousal: Alert Behavior During Therapy: Anxious, Restless Overall Cognitive Status: Difficult to assess                                 General Comments: very HOH (hearing aids, but not sure they are working), follows directions with increased time        Exercises      General Comments        Pertinent Vitals/Pain Pain Assessment Pain Assessment: No/denies pain Faces Pain Scale: Hurts even more Pain Location: all over Pain Descriptors / Indicators: Moaning, Grimacing Pain Intervention(s): Monitored during session    Home Living                          Prior Function            PT Goals (current goals can now be found in the care plan section) Progress towards PT goals: Progressing toward goals    Frequency    Min 1X/week      PT Plan      Co-evaluation              AM-PAC PT "6 Clicks" Mobility   Outcome Measure  Help needed turning from your back to your  side while in a flat bed without using bedrails?: A Lot Help needed moving from lying on your back to sitting on the side of a flat bed without using bedrails?: A Lot Help needed moving to and from a bed to a chair (including a wheelchair)?: A Lot Help needed standing up from a chair using your arms (e.g., wheelchair or bedside chair)?: A Lot Help needed to walk in hospital room?: Total Help needed climbing 3-5 steps with a railing? : Total 6 Click Score: 10    End of Session Equipment Utilized During Treatment: Gait belt Activity Tolerance: Patient limited by fatigue Patient left: in bed;with bed alarm set;with nursing/sitter in room;with call bell/phone within reach Nurse Communication: Mobility status PT Visit Diagnosis: Unsteadiness on feet (R26.81);Difficulty in walking, not elsewhere classified (R26.2);Muscle weakness (generalized) (M62.81)     Time: 1330-1340 PT Time Calculation  (min) (ACUTE ONLY): 10 min  Charges:    $Therapeutic Activity: 8-22 mins PT General Charges $$ ACUTE PT VISIT: 1 Visit                     Blanchard Kelch PT Acute Rehabilitation Services Office [phone] Weekend pager-[phone]    Rada Hay 09/12/2023, 5:10 PM

## 2023-09-12 NOTE — Progress Notes (Signed)
PROGRESS NOTE    Alexa Pham  ZOX:[id number] DOB: [birth year] DOA: 09/04/2023 PCP: Dale Welling, MD   Brief Narrative:  PMH of asthma, CAD, CHF, hypothyroidism, HLD, HTN, hard of hearing, pulmonary hypertension presented for robotic low anterior rectosigmoid resection with anastomosis. Medicine service was consulted for hypotension as well as AKI.  Assessment & Plan:   Principal Problem:   Complete rectal prolapse Active Problems:   Hypertension   Hypothyroidism   Hypercholesterolemia   Aortic atherosclerosis (HCC)   CAD (coronary artery disease)   Urge incontinence   Rectal prolapse   History of non-ST elevation myocardial infarction (NSTEMI)   Chronic anticoagulation   SUI (stress urinary incontinence, female)   Asthma   History of coronary artery stent placement 2021   AKI (acute kidney injury) (HCC)   Hypotension   Macrocytic anemia   Hypocalcemia   Moderate protein malnutrition (HCC)   Delirium   Hypophosphatemia  AKI. Baseline serum creatinine 0.9. On admission serum creatinine 1.5 worsened to 1.7 but improved and back to normal now. Avoid nephrotoxic medications.   Hypophosphatemia. Resolved.   Acute delirium, likely sundowning. Undiagnosed dementia. Fully alert and oriented.  Resolved. 1. Avoid benzodiazepines, antihistamines, anticholinergics, and minimize opiate use as these may worsen delirium. 2: Assess, prevent and manage pain as lack of treatment can result in delirium.  3: Provide appropriate lighting and clear signage; a clock and calendar should be easily visible to the patient. 4: Monitor environmental factors. Reduce light and noise at night (close shades, turn off lights, turn off TV, ect). Correct any alterations in sleep cycle. 5: Reorient the patient to person, place, time and situation on each encounter.  6: Correct sensory deficits if possible (replace eye glasses, hearing aids, ect). 7: Avoid restraints if able. Severely delirious  patients benefit from constant observation by a sitter.   Dysphagia Oral thrush Speech was consulted, due to lethargy and inability to follow commands recommended n.p.o. but patient is more alert now and I noticed that she has been advanced to regular diet by primary service/general surgery.   History of hypertension but now with hypotension. On amlodipine 2.5 mg as well as Coreg 6.25 mg twice daily at home with Lasix and irbesartan.  Currently only on Coreg 3.125 mg p.o. twice daily and blood pressure control.   Persistent nausea. Likely postop.  Resolved.   CAD. Currently on aspirin and Plavix.   HLD. On Lipitor.   Hypothyroidism. On Synthroid 150 mcg. TSH 2.56 in June. TSH and free T4 adequate.  Would not change dose for now.   Prediabetes. Hemoglobin A1c 5.8. Continue sensitive sliding scale.    Acute hypoxic respiratory failure secondary to acute on chronic congestive heart failure with preserved ejection fraction/bilateral pleural effusion: Nocturnal hypoxia. Bilateral small pleural effusion Patient has been requiring 1 to 2 L of oxygen intermittently, mostly during the sleep.  Patient however denies any shortness of breath.  Lungs were clear to auscultation however out of concern of possible fluid overload/congestive heart failure, obtain chest x-ray today which shows vascular congestion and BNP is elevated as well consistent with acute on chronic congestive heart failure with preserved ejection fraction.  Patient appears to be taking Lasix prior to admission which has not been resumed here.  I will start her on Lasix 40 mg every 6 hours x 2 doses and reassess tomorrow morning.  Patient currently on 2 L, try to wean oxygen.  DVT prophylaxis: enoxaparin (LOVENOX) injection 30 mg Start: 09/06/23 0800 SCD's Start: 09/04/23 1414  Code Status: Full Code  Family Communication:  None present at bedside.  Plan of care discussed with patient in length and he/she verbalized  understanding and agreed with it.  Status is: Inpatient Remains inpatient appropriate because: Disposition per primary service/general surgery   Estimated body mass index is 18.29 kg/m as calculated from the following:   Height as of this encounter: 5\' 6"  (1.676 m).   Weight as of this encounter: 51.4 kg.    Nutritional Assessment: Body mass index is 18.29 kg/m.Marland Kitchen Seen by dietician.  I agree with the assessment and plan as outlined below: Nutrition Status: Nutrition Problem: Severe Malnutrition Etiology: chronic illness Signs/Symptoms: severe fat depletion, severe muscle depletion Interventions: Ensure Enlive (each supplement provides 350kcal and 20 grams of protein), MVI, Calorie Count  . Skin Assessment: I have examined the patient's skin and I agree with the wound assessment as performed by the wound care RN as outlined below:    Consultants:  [facility]  Procedures:  As above  Antimicrobials:  Anti-infectives (From admission, onward)    Start     Dose/Rate Route Frequency Ordered Stop   09/09/23 1000  fluconazole (DIFLUCAN) tablet 100 mg       Placed in "Followed by" Linked Group   100 mg Oral Daily 09/08/23 0749 09/15/23 0959   09/08/23 0845  fluconazole (DIFLUCAN) tablet 200 mg       Placed in "Followed by" Linked Group   200 mg Oral  Once 09/08/23 0749 09/08/23 0851   09/04/23 2000  cefoTEtan (CEFOTAN) 2 g in sodium chloride 0.9 % 100 mL IVPB        2 g 200 mL/hr over 30 Minutes Intravenous Every 12 hours 09/04/23 1413 09/04/23 2119   09/04/23 0700  cefoTEtan (CEFOTAN) 2 g in sodium chloride 0.9 % 100 mL IVPB        2 g 200 mL/hr over 30 Minutes Intravenous On call to O.R. 09/04/23 4098 09/04/23 0851         Subjective: Patient seen and examined.  Sitting in the bed.  Denies any complaints.  Objective: Vitals:   09/11/23 2153 09/12/23 0500 09/12/23 0618 09/12/23 1354  BP: (!) 127/54  (!) 141/67 (!) 133/56  Pulse: 75  75 74  Resp: 14  16 18    Temp: 98.1 F (36.7 C)  97.7 F (36.5 C) 98.3 F (36.8 C)  TempSrc: Oral  Oral Oral  SpO2: 100%  100% 96%  Weight:  51.4 kg    Height:        Intake/Output Summary (Last 24 hours) at 09/12/2023 1357 Last data filed at 09/12/2023 1007 Gross per 24 hour  Intake 680 ml  Output 1300 ml  Net -620 ml   Filed Weights   09/10/23 0500 09/11/23 0500 09/12/23 0500  Weight: 58.6 kg 54.9 kg 51.4 kg    Examination:  General exam: Appears calm and comfortable  Respiratory system: Clear to auscultation. Respiratory effort normal. Cardiovascular system: S1 & S2 heard, RRR. No JVD, murmurs, rubs, gallops or clicks. No pedal edema. Gastrointestinal system: Abdomen is nondistended, soft and nontender. No organomegaly or masses felt. Normal bowel sounds heard. Central nervous system: Alert and oriented. No focal neurological deficits. Extremities: Symmetric 5 x 5 power. Skin: No rashes, lesions or ulcers.   Data Reviewed: I have personally reviewed following labs and imaging studies  CBC: Recent Labs  Lab 09/05/23 1900 09/07/23 1108 09/08/23 0412 09/09/23 0417 09/10/23 0446 09/11/23 0445 09/12/23 0854  WBC 8.2   < >  8.4 9.3 7.9 7.6 6.9  NEUTROABS 6.5  --   --   --   --   --  4.7  HGB 10.2*   < > 11.3* 10.5* 11.0* 10.9* 11.3*  HCT 33.3*   < > 37.4 34.4* 34.4* 34.6* 35.7*  MCV 102.5*   < > 101.6* 102.7* 98.9 98.9 98.6  PLT 215   < > 246 233 273 293 331   < > = values in this interval not displayed.   Basic Metabolic Panel: Recent Labs  Lab 09/07/23 1108 09/08/23 0412 09/09/23 0417 09/10/23 0446 09/11/23 0445 09/12/23 0854  NA 135 136 135 139 137 138  K 4.6 5.0 5.1 3.8 4.3 4.3  CL 105 105 107 103 99 95*  CO2 23 28 25 31  34* 36*  GLUCOSE 124* 146* 139* 141* 116* 83  BUN 29* 29* 18 13 15 19   CREATININE 1.27* 1.16* 0.93 0.81 0.74 0.84  CALCIUM 8.6* 8.9 8.7* 8.0* 8.3* 8.6*  MG  --  1.9 1.9 1.7  --   --   PHOS 2.7 2.5  2.5 2.2* 1.8* 3.1  --    GFR: Estimated Creatinine  Clearance: 35.4 mL/min (by C-G formula based on SCr of 0.84 mg/dL). Liver Function Tests: Recent Labs  Lab 09/06/23 0818 09/07/23 1108 09/08/23 0412 09/09/23 0417 09/10/23 0446 09/11/23 0445  AST 42*  --   --   --   --   --   ALT 25  --   --   --   --   --   ALKPHOS 47  --   --   --   --   --   BILITOT 0.6  --   --   --   --   --   PROT 5.5*  --   --   --   --   --   ALBUMIN 2.8* 2.7* 2.7* 2.4* 2.4* 2.4*   No results for input(s): "LIPASE", "AMYLASE" in the last 168 hours. No results for input(s): "AMMONIA" in the last 168 hours. Coagulation Profile: No results for input(s): "INR", "PROTIME" in the last 168 hours. Cardiac Enzymes: No results for input(s): "CKTOTAL", "CKMB", "CKMBINDEX", "TROPONINI" in the last 168 hours. BNP (last 3 results) No results for input(s): "PROBNP" in the last 8760 hours. HbA1C: No results for input(s): "HGBA1C" in the last 72 hours. CBG: No results for input(s): "GLUCAP" in the last 168 hours. Lipid Profile: No results for input(s): "CHOL", "HDL", "LDLCALC", "TRIG", "CHOLHDL", "LDLDIRECT" in the last 72 hours. Thyroid Function Tests: No results for input(s): "TSH", "T4TOTAL", "FREET4", "T3FREE", "THYROIDAB" in the last 72 hours. Anemia Panel: No results for input(s): "VITAMINB12", "FOLATE", "FERRITIN", "TIBC", "IRON", "RETICCTPCT" in the last 72 hours. Sepsis Labs: No results for input(s): "PROCALCITON", "LATICACIDVEN" in the last 168 hours.  No results found for this or any previous visit (from the past 240 hour(s)).   Radiology Studies: DG CHEST PORT 1 VIEW  Result Date: 09/12/2023 CLINICAL DATA:  CHF EXAM: PORTABLE CHEST 1 VIEW COMPARISON:  X-ray 09/10/2023. FINDINGS: Hyperinflation. Small pleural effusions. Enlarged cardiopericardial silhouette with vascular congestion. There is some fullness of both hila which is unchanged. No pneumothorax. Persistent mild lung base opacities. Surgical clips in the left axillary region. Osteopenia and  degenerative changes. IMPRESSION: Persistent lung base opacities and pleural effusions. Enlarged heart with some central vascular congestion. Recommend continued follow up Electronically Signed   By: Karen Kays M.D.   On: 09/12/2023 12:01    Scheduled Meds:  acetaminophen  500 mg Oral TID WC & HS   albuterol  2.5 mg Nebulization BID   aspirin EC  81 mg Oral Daily   carvedilol  3.125 mg Oral BID WC   clopidogrel  75 mg Oral Daily   enoxaparin (LOVENOX) injection  30 mg Subcutaneous Q24H   feeding supplement  237 mL Oral TID BM   fluconazole  100 mg Oral Daily   furosemide  40 mg Intravenous Q6H   hydrocerin   Topical BID   levothyroxine  150 mcg Oral Daily   metoCLOPramide  5 mg Oral Q breakfast   metoCLOPramide  5 mg Oral Q supper   polycarbophil  625 mg Oral BID   simethicone  40 mg Oral QID   sodium chloride flush  3 mL Intravenous Q12H   Continuous Infusions:  sodium chloride       LOS: 8 days   Hughie Closs, MD [facility]  09/12/2023, 1:57 PM   *Please note that this is a verbal dictation therefore any spelling or grammatical errors are due to the "Dragon [employer] One" system interpretation.  Please page via Amion and do not message via secure chat for urgent patient care matters. Secure chat can be used for non urgent patient care matters.  How to contact the [facility] Attending or Consulting provider 7A - 7P or covering provider during after hours 7P -7A, for this patient?  Check the care team in [facility] and look for a) attending/consulting [facility] provider listed and b) the [facility] team listed. Page or secure chat 7A-7P. Log into [email or web address] and use 's universal password to access. If you do not have the password, please contact the hospital operator. Locate the [facility] provider you are looking for under [facility] and page to a number that you can be directly reached. If you still have difficulty reaching the provider, please page the [facility] (Director on Call) for  the Hospitalists listed on amion for assistance.

## 2023-09-13 DIAGNOSIS — E43 Unspecified severe protein-calorie malnutrition: Secondary | ICD-10-CM | POA: Clinically undetermined

## 2023-09-13 DIAGNOSIS — K623 Rectal prolapse: Secondary | ICD-10-CM | POA: Diagnosis not present

## 2023-09-13 LAB — BASIC METABOLIC PANEL
Anion gap: 8 (ref 5–15)
BUN: 31 mg/dL — ABNORMAL HIGH (ref 8–23)
CO2: 43 mmol/L — ABNORMAL HIGH (ref 22–32)
Calcium: 8.6 mg/dL — ABNORMAL LOW (ref 8.9–10.3)
Chloride: 91 mmol/L — ABNORMAL LOW (ref 98–111)
Creatinine, Ser: 1.14 mg/dL — ABNORMAL HIGH (ref 0.44–1.00)
GFR, Estimated: 45 mL/min — ABNORMAL LOW (ref >60)
Glucose, Bld: 132 mg/dL — ABNORMAL HIGH (ref 70–99)
Potassium: 3.8 mmol/L (ref 3.5–5.1)
Sodium: 142 mmol/L (ref 135–145)

## 2023-09-13 MED ORDER — FUROSEMIDE 10 MG/ML IJ SOLN
40.0000 mg | Freq: Four times a day (QID) | INTRAMUSCULAR | Status: AC
  Administered 2023-09-13 (×2): 40 mg via INTRAVENOUS
  Filled 2023-09-13 (×2): qty 4

## 2023-09-13 NOTE — TOC Progression Note (Signed)
Transition of Care [facility]) - Progression Note    Patient Details  Name: Alexa Pham MRN: [id number] Date of Birth: June 29, 1930  [facility] CM/SW Contact  Adrian Prows, RN Phone Number: 09/13/2023, 4:28 PM  Clinical Narrative:    Pt not ready for d/c; TOC is following.  Expected Discharge Plan: Skilled Nursing Facility Barriers to Discharge: Continued Medical Work up, SNF Pending bed offer  Expected Discharge Plan and Services In-house Referral: Clinical Social Work Discharge Planning Services: NA Post Acute Care Choice: Skilled Nursing Facility Living arrangements for the past 2 months: Single Family Home                 DME Arranged: N/A DME Agency: NA                   Social Determinants of Health (SDOH) Interventions SDOH Screenings   Food Insecurity: No Food Insecurity (09/04/2023)  Housing: Low Risk  (09/04/2023)  Transportation Needs: No Transportation Needs (09/04/2023)  Utilities: Not At Risk (09/04/2023)  Depression (PHQ2-9): Low Risk  (04/04/2023)  Financial Resource Strain: Low Risk  (10/22/2022)  Physical Activity: Insufficiently Active (10/22/2022)  Social Connections: Moderately Integrated (10/22/2022)  Stress: No Stress Concern Present (10/22/2022)  Tobacco Use: Medium Risk (09/04/2023)    Readmission Risk Interventions    09/06/2023    1:20 PM  Readmission Risk Prevention Plan  Transportation Screening Complete  PCP or Specialist Appt within 5-7 Days Complete  Home Care Screening Complete  Medication Review (RN CM) Complete

## 2023-09-13 NOTE — Progress Notes (Signed)
PROGRESS NOTE    Alexa Pham  YQI:[id number] DOB: 07-Dec-1929 DOA: 09/04/2023 PCP: Dale [place], MD   Brief Narrative:  PMH of asthma, CAD, CHF, hypothyroidism, HLD, HTN, hard of hearing, pulmonary hypertension presented for robotic low anterior rectosigmoid resection with anastomosis. Medicine service was consulted for hypotension as well as AKI.  Assessment & Plan:   Principal Problem:   Complete rectal prolapse Active Problems:   Hypertension   Hypothyroidism   Hypercholesterolemia   Aortic atherosclerosis (HCC)   CAD (coronary artery disease)   Urge incontinence   Rectal prolapse   History of non-ST elevation myocardial infarction (NSTEMI)   Chronic anticoagulation   SUI (stress urinary incontinence, female)   Asthma   History of coronary artery stent placement 2021   AKI (acute kidney injury) (HCC)   Hypotension   Macrocytic anemia   Hypocalcemia   Moderate protein malnutrition (HCC)   Delirium   Hypophosphatemia   Protein-calorie malnutrition, severe  AKI. Baseline serum creatinine 0.9. On admission serum creatinine 1.5 worsened to 1.7 but improved and back to normal until this morning 09/13/2023 when creatinine rose slightly to 1.14.  Repeat labs in the morning.   Hypophosphatemia. Resolved.   Acute delirium, likely sundowning. Undiagnosed dementia. Fully alert and oriented.  Resolved. 1. Avoid benzodiazepines, antihistamines, anticholinergics, and minimize opiate use as these may worsen delirium. 2: Assess, prevent and manage pain as lack of treatment can result in delirium.  3: Provide appropriate lighting and clear signage; a clock and calendar should be easily visible to the patient. 4: Monitor environmental factors. Reduce light and noise at night (close shades, turn off lights, turn off TV, ect). Correct any alterations in sleep cycle. 5: Reorient the patient to person, place, time and situation on each encounter.  6: Correct sensory deficits if  possible (replace eye glasses, hearing aids, ect). 7: Avoid restraints if able. Severely delirious patients benefit from constant observation by a sitter.   Dysphagia Oral thrush Speech was consulted, due to lethargy and inability to follow commands recommended n.p.o. but patient is more alert now and I noticed that she has been advanced to regular diet by primary service/general surgery.   History of hypertension but now with hypotension. On amlodipine 2.5 mg as well as Coreg 6.25 mg twice daily at home with Lasix and irbesartan.  Currently only on Coreg 3.125 mg p.o. twice daily and blood pressure control.   Persistent nausea. Likely postop.  Resolved.   CAD. Currently on aspirin and Plavix.   HLD. On Lipitor.   Hypothyroidism. On Synthroid 150 mcg. TSH 2.56 in June. TSH and free T4 adequate.  Would not change dose for now.   Prediabetes. Hemoglobin A1c 5.8. Continue sensitive sliding scale.    Acute hypoxic respiratory failure secondary to acute on chronic congestive heart failure with preserved ejection fraction/bilateral pleural effusion: Nocturnal hypoxia. Bilateral small pleural effusion Much better today.  Lungs clear to auscultation.  She is not on oxygen when I saw him this morning.  However we will give her 2 more doses of IV Lasix 40 mg each for today.  I doubt she is going to require any more Lasix.  DVT prophylaxis: enoxaparin (LOVENOX) injection 30 mg Start: 09/06/23 0800 SCD's Start: 09/04/23 1414   Code Status: Full Code  Family Communication:  None present at bedside.  Plan of care discussed with patient in length and he/she verbalized understanding and agreed with it.  Status is: Inpatient Remains inpatient appropriate because: Disposition per primary service/general surgery  Estimated body mass index is 18.04 kg/m as calculated from the following:   Height as of this encounter: 5\' 6"  (1.676 m).   Weight as of this encounter: 50.7 kg.    Nutritional  Assessment: Body mass index is 18.04 kg/m.Marland Kitchen Seen by dietician.  I agree with the assessment and plan as outlined below: Nutrition Status: Nutrition Problem: Severe Malnutrition Etiology: chronic illness Signs/Symptoms: severe fat depletion, severe muscle depletion Interventions: Ensure Enlive (each supplement provides 350kcal and 20 grams of protein), MVI, Calorie Count  . Skin Assessment: I have examined the patient's skin and I agree with the wound assessment as performed by the wound care RN as outlined below:    Consultants:  [facility] hospitalist  Procedures:  As above  Antimicrobials:  Anti-infectives (From admission, onward)    Start     Dose/Rate Route Frequency Ordered Stop   09/09/23 1000  fluconazole (DIFLUCAN) tablet 100 mg       Placed in "Followed by" Linked Group   100 mg Oral Daily 09/08/23 0749 09/15/23 0959   09/08/23 0845  fluconazole (DIFLUCAN) tablet 200 mg       Placed in "Followed by" Linked Group   200 mg Oral  Once 09/08/23 0749 09/08/23 0851   09/04/23 2000  cefoTEtan (CEFOTAN) 2 g in sodium chloride 0.9 % 100 mL IVPB        2 g 200 mL/hr over 30 Minutes Intravenous Every 12 hours 09/04/23 1413 09/04/23 2119   09/04/23 0700  cefoTEtan (CEFOTAN) 2 g in sodium chloride 0.9 % 100 mL IVPB        2 g 200 mL/hr over 30 Minutes Intravenous On call to O.R. 09/04/23 3664 09/04/23 0851         Subjective: Patient seen and examined.  She is sitting in the recliner.  Fully alert and oriented.  She has no complaints.  Patient appears to be medically stable at this point in time.  Per general surgery/primary service note, she will be discharged to SNF on 09/16/2023.  I will keep her on my list but will follow remotely and check her labs tomorrow morning.  If there will be any issue, I will see her in person otherwise we will sign off if labs are looking good tomorrow.  Objective: Vitals:   09/12/23 2034 09/13/23 0500 09/13/23 0530 09/13/23 0757  BP: 131/61  (!)  148/79   Pulse: 67  80   Resp: 15  16   Temp: 97.9 F (36.6 C)  98 F (36.7 C)   TempSrc: Oral     SpO2: 98%  97% 92%  Weight:  50.7 kg    Height:        Intake/Output Summary (Last 24 hours) at 09/13/2023 1128 Last data filed at 09/13/2023 0932 Gross per 24 hour  Intake 750 ml  Output 3150 ml  Net -2400 ml   Filed Weights   09/11/23 0500 09/12/23 0500 09/13/23 0500  Weight: 54.9 kg 51.4 kg 50.7 kg    Examination:  General exam: Appears calm and comfortable  Respiratory system: Clear to auscultation. Respiratory effort normal. Cardiovascular system: S1 & S2 heard, RRR. No JVD, murmurs, rubs, gallops or clicks. No pedal edema. Gastrointestinal system: Abdomen is nondistended, soft and nontender. No organomegaly or masses felt. Normal bowel sounds heard. Central nervous system: Alert and oriented. No focal neurological deficits. Extremities: Symmetric 5 x 5 power. Skin: No rashes, lesions or ulcers.    Data Reviewed: I have personally reviewed following labs  and imaging studies  CBC: Recent Labs  Lab 09/08/23 0412 09/09/23 0417 09/10/23 0446 09/11/23 0445 09/12/23 0854  WBC 8.4 9.3 7.9 7.6 6.9  NEUTROABS  --   --   --   --  4.7  HGB 11.3* 10.5* 11.0* 10.9* 11.3*  HCT 37.4 34.4* 34.4* 34.6* 35.7*  MCV 101.6* 102.7* 98.9 98.9 98.6  PLT 246 233 273 293 331   Basic Metabolic Panel: Recent Labs  Lab 09/07/23 1108 09/08/23 0412 09/09/23 0417 09/10/23 0446 09/11/23 0445 09/12/23 0854 09/13/23 0440  NA 135 136 135 139 137 138 142  K 4.6 5.0 5.1 3.8 4.3 4.3 3.8  CL 105 105 107 103 99 95* 91*  CO2 23 28 25 31  34* 36* 43*  GLUCOSE 124* 146* 139* 141* 116* 83 132*  BUN 29* 29* 18 13 15 19  31*  CREATININE 1.27* 1.16* 0.93 0.81 0.74 0.84 1.14*  CALCIUM 8.6* 8.9 8.7* 8.0* 8.3* 8.6* 8.6*  MG  --  1.9 1.9 1.7  --   --   --   PHOS 2.7 2.5  2.5 2.2* 1.8* 3.1  --   --    GFR: Estimated Creatinine Clearance: 25.7 mL/min (A) (by C-G formula based on SCr of 1.14 mg/dL  (H)). Liver Function Tests: Recent Labs  Lab 09/07/23 1108 09/08/23 0412 09/09/23 0417 09/10/23 0446 09/11/23 0445  ALBUMIN 2.7* 2.7* 2.4* 2.4* 2.4*   No results for input(s): "LIPASE", "AMYLASE" in the last 168 hours. No results for input(s): "AMMONIA" in the last 168 hours. Coagulation Profile: No results for input(s): "INR", "PROTIME" in the last 168 hours. Cardiac Enzymes: No results for input(s): "CKTOTAL", "CKMB", "CKMBINDEX", "TROPONINI" in the last 168 hours. BNP (last 3 results) No results for input(s): "PROBNP" in the last 8760 hours. HbA1C: No results for input(s): "HGBA1C" in the last 72 hours. CBG: No results for input(s): "GLUCAP" in the last 168 hours. Lipid Profile: No results for input(s): "CHOL", "HDL", "LDLCALC", "TRIG", "CHOLHDL", "LDLDIRECT" in the last 72 hours. Thyroid Function Tests: No results for input(s): "TSH", "T4TOTAL", "FREET4", "T3FREE", "THYROIDAB" in the last 72 hours. Anemia Panel: No results for input(s): "VITAMINB12", "FOLATE", "FERRITIN", "TIBC", "IRON", "RETICCTPCT" in the last 72 hours. Sepsis Labs: No results for input(s): "PROCALCITON", "LATICACIDVEN" in the last 168 hours.  No results found for this or any previous visit (from the past 240 hour(s)).   Radiology Studies: DG CHEST PORT 1 VIEW  Result Date: 09/12/2023 CLINICAL DATA:  CHF EXAM: PORTABLE CHEST 1 VIEW COMPARISON:  X-ray 09/10/2023. FINDINGS: Hyperinflation. Small pleural effusions. Enlarged cardiopericardial silhouette with vascular congestion. There is some fullness of both hila which is unchanged. No pneumothorax. Persistent mild lung base opacities. Surgical clips in the left axillary region. Osteopenia and degenerative changes. IMPRESSION: Persistent lung base opacities and pleural effusions. Enlarged heart with some central vascular congestion. Recommend continued follow up Electronically Signed   By: Karen Kays M.D.   On: 09/12/2023 12:01    Scheduled Meds:   acetaminophen  500 mg Oral TID WC & HS   albuterol  2.5 mg Nebulization BID   aspirin EC  81 mg Oral Daily   carvedilol  3.125 mg Oral BID WC   clopidogrel  75 mg Oral Daily   enoxaparin (LOVENOX) injection  30 mg Subcutaneous Q24H   feeding supplement  237 mL Oral TID BM   fluconazole  100 mg Oral Daily   furosemide  40 mg Intravenous Q6H   hydrocerin   Topical BID   levothyroxine  150 mcg Oral Daily   metoCLOPramide  5 mg Oral Q breakfast   metoCLOPramide  5 mg Oral Q supper   polycarbophil  625 mg Oral BID   simethicone  40 mg Oral QID   sodium chloride flush  3 mL Intravenous Q12H   Continuous Infusions:  sodium chloride       LOS: 9 days   Hughie Closs, MD [facility]  09/13/2023, 11:28 AM   *Please note that this is a verbal dictation therefore any spelling or grammatical errors are due to the "Dragon Medical One" system interpretation.  Please page via Amion and do not message via secure chat for urgent patient care matters. Secure chat can be used for non urgent patient care matters.  How to contact the [facility] Attending or Consulting provider 7A - 7P or covering provider during after hours 7P -7A, for this patient?  Check the care team in [facility] and look for a) attending/consulting [facility] provider listed and b) the [facility] team listed. Page or secure chat 7A-7P. Log into [email or web address] and use [facility] universal password to access. If you do not have the password, please contact the hospital operator. Locate the [facility] provider you are looking for under [facility] and page to a number that you can be directly reached. If you still have difficulty reaching the provider, please page the [facility] (Director on Call) for the Hospitalists listed on amion for assistance.

## 2023-09-13 NOTE — Progress Notes (Addendum)
09/13/2023  Alexa Pham [id number] March 23, 1930  CARE TEAM: PCP: Dale [place], MD  Outpatient Care Team: Patient Care Team: Dale [place], MD as PCP - General (Internal Medicine) Salena Saner, MD as Consulting Physician (Pulmonary Disease) Elige Radon, MD as Referring Physician (Cardiology) [place]ia [place] Rossie Muskrat, MD as Consulting Physician (Gastroenterology) Karie Soda, MD as Consulting Physician (General Surgery) Lamar Blinks, MD as Referring Physician (Cardiology) Vanna Scotland, MD as Consulting Physician (Urology)  Inpatient Treatment Team: Treatment Team:  Karie Soda, MD Lilyan Gilford, MD Hughie Closs, MD Montel Clock, RN [place], [place], [place] Kathleene Hazel, RN Norva Pavlov, [facility]   Problem List:   Principal Problem:   Complete rectal prolapse Active Problems:   Hypertension   Hypothyroidism   Hypercholesterolemia   Aortic atherosclerosis (HCC)   CAD (coronary artery disease)   Urge incontinence   Rectal prolapse   History of non-ST elevation myocardial infarction (NSTEMI)   Chronic anticoagulation   SUI (stress urinary incontinence, female)   Asthma   History of coronary artery stent placement 2021   AKI (acute kidney injury) (HCC)   Hypotension   Macrocytic anemia   Hypocalcemia   Moderate protein malnutrition (HCC)   Delirium   Hypophosphatemia   09/04/2023  POST-OPERATIVE DIAGNOSIS:  RECTAL PROLAPSE   PROCEDURE:   ROBOTIC LOW ANTERIOR RECTOSIGMOID RESECTION WITH ANASTOMOSIS ROBOTIC RECTOPEXY FLEXIBLE SIGMOIDOSCOPY TRANSVERSUS ABDOMINIS PLANE (TAP) BLOCK - BILATERAL   Surgeons:  Karie Soda, MD  OR FINDINGS:  Very redundant sigmoid colon resting in the low pelvis with some adhesions at the left adnexa.  1/2 feet of redundant rectosigmoid colon excised.   Patient has a descending colon to proximal rectal 31 EEA stapled anastomosis that rests 12-13 cm from the anal  verge  Assessment [facility] Stay = 9 days) 9 Days Post-Op    Sundowning and deconditioned - improving slowly   Plan:  Patient MS back to normal which is reassuring.  Workup negative for any obvious stroke.  Agree with internal medicine this is most likely sundowning with some delirium that seems to gradually improving.  Solid diet.  Calorie counts.  Supple shakes.  Hopefully she can have enough oral nutrition to continue to heal   Mobilizing get out of bed.  Plan to work with nursing team and therapies to see.  Anticoagulation regimen.  She is not actively bleeding back on home Plavix.  Follow electrolytes.  Hypophosphatemia.  Improved with IV replacement.  Follow-up.  Foley removed 12/2. UA neg  Try to keep on the dry side.  Internal medicine agreed with Lasix.  Diuresed an extra 2 L.  Slight bump in creatinine but breathing better.  Defer if they wish to try again since she is not totally off oxygen yet.  Concern for oral flush.  Fluconazole 7-day regimen underway  History of significant hypertension on numerous medications.  Back on Coreg.  Defer to medicine on resuming amlodipine and angiotensin him better. VTE prophylaxis- SCDs, etc. back on Plavix anticoagulation.  She is worried about having fecal incontinence again.  She does not feel like her sensation is back to normal nor incontinence.  Noted that can take some time, especially 89 year old.  I did caution that she will not have perfect continence necessarily in a 89 year old but should improve.  Discussed using Kegel exercises again.  See if physical therapy has some insights as well while she is in house.  If she is not better in 3 months, may consider outpatient pelvic floor physical  therapy.  Hold off for now given recent pelvic surgery  Improving but still deconditioned.  I do think she would benefit from short-term SNF for more aggressive rehab to improve.  No beds available today = most likely transition to skilled  nurse facility with PT/OT on Monday 12/9.  -Disposition:  Disposition:  The patient is from: Home Anticipate discharge to:  Skilled Nursing Facility (SNF) Anticipated Date of Discharge is:  December 9,2024   Barriers to discharge:  Consultant clearance & sign off  , Transitions of Care, Therapy assessment & Recommendations pending, Need for inpatient procedure/study, and Pending Clinical improvement (more likely than not)  Patient currently is NOT MEDICALLY STABLE for discharge from the hospital from a surgery standpoint.      I reviewed nursing notes, hospitalist notes, last 24 h vitals and pain scores, last 48 h intake and output, last 24 h labs and trends, and last 24 h imaging results.  I have reviewed this patient's available data, including medical history, events of note, test results, etc as part of my evaluation.   A significant portion of that time was spent in counseling. Care during the described time interval was provided by me.  This care required high  level of medical decision making.  09/13/2023    Subjective: (Chief complaint)  Patient feeling better.  Getting an appetite back.  Still feels tired but trying to get out of bed.  Diuresed extra 2 L.  Feels like she is breathing better  Denies abdominal pain  Objective:  Vital signs:  Vitals:   09/12/23 1354 09/12/23 2034 09/13/23 0500 09/13/23 0530  BP: (!) 133/56 131/61  (!) 148/79  Pulse: 74 67  80  Resp: 18 15  16   Temp: 98.3 F (36.8 C) 97.9 F (36.6 C)  98 F (36.7 C)  TempSrc: Oral Oral    SpO2: 96% 98%  97%  Weight:   50.7 kg   Height:        Last BM Date : 09/11/23  Intake/Output   Yesterday:  12/05 0701 - 12/06 0700 In: 870 [P.O.:870] Out: 2950 [Urine:2950] This shift:  No intake/output data recorded.  Bowel function:  Flatus: YES  BM:  YES  Drain: (No drain)   Physical Exam:  General: Pt awake /alert in no acute distress.  Very close to baseline.  Not toxic nor  sickly.  Alert and chatty.  Initiating conversation. Eyes: PERRL, normal EOM.  Sclera clear.  No icterus Neuro: CN II-XII intact w/o focal sensory/motor deficits. Lymph: No head/neck/groin lymphadenopathy Psych: No delirium or confusion today.  Not anxious..  Oriented x 4.   HENT: Normocephalic, Mucus membranes moist.  No thrush Neck: Supple, No tracheal deviation.  No obvious thyromegaly Chest: No pain to chest wall compression.  Good respiratory excursion.  No audible wheezing CV:  Pulses intact.  Regular rhythm.  No major extremity edema MS: Normal AROM mjr joints.  No obvious deformity  Abdomen: Soft.  Nondistended.  Nontender.  Incisions well-healed without any cellulitis or drainage.  No evidence of peritonitis.  No incarcerated hernias.  Ext:   No deformity.  No mjr edema.  No cyanosis Skin: No petechiae / purpurea.  No major sores.  Warm and dry    Results:   Cultures: No results found for this or any previous visit (from the past 720 hour(s)).  Labs: Results for orders placed or performed during the hospital encounter of 09/04/23 (from the past 48 hour(s))  CBC with Differential/Platelet  Status: Abnormal   Collection Time: 09/12/23  8:54 AM  Result Value Ref Range   WBC 6.9 4.0 - 10.5 K/uL   RBC 3.62 (L) 3.87 - 5.11 MIL/uL   Hemoglobin 11.3 (L) 12.0 - 15.0 g/dL   HCT 16.1 (L) 09.6 - 04.5 %   MCV 98.6 80.0 - 100.0 fL   MCH 31.2 26.0 - 34.0 pg   MCHC 31.7 30.0 - 36.0 g/dL   RDW 40.9 (H) 81.1 - 91.4 %   Platelets 331 150 - 400 K/uL   nRBC 0.0 0.0 - 0.2 %   Neutrophils Relative % 69 %   Neutro Abs 4.7 1.7 - 7.7 K/uL   Lymphocytes Relative 15 %   Lymphs Abs 1.1 0.7 - 4.0 K/uL   Monocytes Relative 12 %   Monocytes Absolute 0.8 0.1 - 1.0 K/uL   Eosinophils Relative 3 %   Eosinophils Absolute 0.2 0.0 - 0.5 K/uL   Basophils Relative 0 %   Basophils Absolute 0.0 0.0 - 0.1 K/uL   Immature Granulocytes 1 %   Abs Immature Granulocytes 0.09 (H) 0.00 - 0.07 K/uL     Comment: Performed at [facility], [address] [place], [place] [address]  Basic metabolic panel     Status: Abnormal   Collection Time: 09/12/23  8:54 AM  Result Value Ref Range   Sodium 138 135 - 145 mmol/L   Potassium 4.3 3.5 - 5.1 mmol/L   Chloride 95 (L) 98 - 111 mmol/L   CO2 36 (H) 22 - 32 mmol/L   Glucose, Bld 83 70 - 99 mg/dL    Comment: Glucose reference range applies only to samples taken after fasting for at least 8 hours.   BUN 19 8 - 23 mg/dL   Creatinine, Ser 6.21 0.44 - 1.00 mg/dL   Calcium 8.6 (L) 8.9 - 10.3 mg/dL   GFR, Estimated >30 >86 mL/min    Comment: (NOTE) Calculated using the CKD-EPI Creatinine Equation (2021)    Anion gap 7 5 - 15    Comment: Performed at [facility], [address] [place], [place] [address]  Brain natriuretic peptide     Status: Abnormal   Collection Time: 09/12/23  8:54 AM  Result Value Ref Range   B Natriuretic Peptide 159.1 (H) 0.0 - 100.0 pg/mL    Comment: Performed at [facility], [address], [place], [place] [address]  Basic metabolic panel     Status: Abnormal   Collection Time: 09/13/23  4:40 AM  Result Value Ref Range   Sodium 142 135 - 145 mmol/L   Potassium 3.8 3.5 - 5.1 mmol/L   Chloride 91 (L) 98 - 111 mmol/L   CO2 43 (H) 22 - 32 mmol/L   Glucose, Bld 132 (H) 70 - 99 mg/dL    Comment: Glucose reference range applies only to samples taken after fasting for at least 8 hours.   BUN 31 (H) 8 - 23 mg/dL   Creatinine, Ser 2.84 (H) 0.44 - 1.00 mg/dL   Calcium 8.6 (L) 8.9 - 10.3 mg/dL   GFR, Estimated 45 (L) >60 mL/min    Comment: (NOTE) Calculated using the CKD-EPI Creatinine Equation (2021)    Anion gap 8 5 - 15    Comment: Performed at [facility], [address], [place], [place] [address]    Imaging / Studies: DG CHEST PORT 1 VIEW  Result Date: 09/12/2023 CLINICAL DATA:  CHF EXAM: PORTABLE CHEST 1  VIEW COMPARISON:  X-ray  09/10/2023. FINDINGS: Hyperinflation. Small pleural effusions. Enlarged cardiopericardial silhouette with vascular congestion. There is some fullness of both hila which is unchanged. No pneumothorax. Persistent mild lung base opacities. Surgical clips in the left axillary region. Osteopenia and degenerative changes. IMPRESSION: Persistent lung base opacities and pleural effusions. Enlarged heart with some central vascular congestion. Recommend continued follow up Electronically Signed   By: Karen Kays M.D.   On: 09/12/2023 12:01    Medications / Allergies: per chart  Antibiotics: Anti-infectives (From admission, onward)    Start     Dose/Rate Route Frequency Ordered Stop   09/09/23 1000  fluconazole (DIFLUCAN) tablet 100 mg       Placed in "Followed by" Linked Group   100 mg Oral Daily 09/08/23 0749 09/15/23 0959   09/08/23 0845  fluconazole (DIFLUCAN) tablet 200 mg       Placed in "Followed by" Linked Group   200 mg Oral  Once 09/08/23 0749 09/08/23 0851   09/04/23 2000  cefoTEtan (CEFOTAN) 2 g in sodium chloride 0.9 % 100 mL IVPB        2 g 200 mL/hr over 30 Minutes Intravenous Every 12 hours 09/04/23 1413 09/04/23 2119   09/04/23 0700  cefoTEtan (CEFOTAN) 2 g in sodium chloride 0.9 % 100 mL IVPB        2 g 200 mL/hr over 30 Minutes Intravenous On call to O.R. 09/04/23 6578 09/04/23 0851         Note: Portions of this report may have been transcribed using voice recognition software. Every effort was made to ensure accuracy; however, inadvertent computerized transcription errors may be present.   Any transcriptional errors that result from this process are unintentional.    Ardeth Sportsman, MD, FACS, MASCRS Esophageal, Gastrointestinal & Colorectal Surgery Robotic and Minimally Invasive Surgery  [facility] [address] [place], [place] [address] [phone] Fax [phone] Main  CONTACT  INFORMATION: Weekday (9AM-5PM): Call [facility] main office at [phone] Weeknight (5PM-9AM) or Weekend/Holiday: Check EPIC "Web Links" tab & use "AMION" (password " [email or web address]") for General Surgery [facility] coverage  Please, DO NOT use SecureChat  (it is not reliable communication to reach operating surgeons & will lead to a delay in care).   Epic staff messaging available for outptient concerns needing 1-2 business day response.      09/13/2023  7:15 AM

## 2023-09-13 NOTE — Progress Notes (Addendum)
Calorie Count Note  48 hour calorie count ordered per surgery.   - Please document % intake of all foods, drinks, and nutrition supplements patient consumes on meal tickets and place in envelope on patient's door.             - If patient skips/refuses meals please document 0% for that meal.  Diet: Regular Supplements:  -Ensure Plus High Protein po TID, each supplement provides 350 kcal and 20 grams of protein.  -Magic cup TID with meals, each supplement provides 290 kcal and 9 grams of protein   Day 2: 12/6 Breakfast: 100 kcals, 1g protein 12/6 Lunch: 415 kcals, 12g protein 12/5 Dinner: 150 kcals, 6g protein Supplements: 2 Ensures + 75% of a Network engineer High Protein: 865 kcals, 51g protein  Total intake: 1530 kcal (100% of minimum estimated needs)  70g protein (100% of minimum estimated needs)  Nutrition Dx: Severe Malnutrition related to chronic illness as evidenced by severe fat depletion, severe muscle depletion.   Goal: Pt to meet >/= 90% of their estimated nutrition needs   Intervention:  -Continue Ensure and/or other protein shakes -Continue Magic cups -Encourage PO intakes -D/c Calorie Count after lunch today  Tilda Franco, MS, RD, LDN Inpatient Clinical Dietitian Contact via Secure chat

## 2023-09-13 NOTE — Progress Notes (Signed)
Occupational Therapy Treatment Patient Details Name: Alexa Pham MRN: [id number] DOB: July 11, 1930 Today's Date: 09/13/2023   History of present illness Pt is a 89 yr old female presenting on 11/27 with rectal prolapse s/p surgery 11/27. PMH includes: HTN, breast cancer s/p  partial mastectomy L side, macular degeneration, CHF, MI.   OT comments  The pt required assist to perform a step-pivot transfer to the bedside commode using a RW, for toileting at bedside commode level, total assist to don socks, and min assist to don a hospital gown in sitting. She reported dizziness with activity, stating "I feel like I'm going to pass out." (Pt's blood pressure was taken and noted to be 163/89). She presented with good effort and participation, though noted to be limited by deconditioning, unsteadiness in standing, and generalized strength deficits. Continue OT plan of care. Patient will benefit from continued inpatient follow up therapy, <3 hours/day.       If plan is discharge home, recommend the following:  A lot of help with walking and/or transfers;A lot of help with bathing/dressing/bathroom;Assist for transportation;Assistance with cooking/housework;Direct supervision/assist for medications management   Equipment Recommendations  BSC/3in1    Recommendations for Other Services      Precautions / Restrictions Precautions Precautions: Fall Precaution Comments: abdominal incision Restrictions Weight Bearing Restrictions: No       Mobility Bed Mobility Overal bed mobility: Needs Assistance       Supine to sit: Min assist, HOB elevated, Used rails          Transfers Overall transfer level: Needs assistance Equipment used: Rolling walker (2 wheels) Transfers: Sit to/from Stand, Bed to chair/wheelchair/BSC Sit to Stand: Mod assist Stand pivot transfers: Mod assist, +2 safety/equipment   Step pivot transfers: Mod assist               ADL either performed or assessed with  clinical judgement   ADL Overall ADL's : Needs assistance/impaired Eating/Feeding: Set up;Sitting Eating/Feeding Details (indicate cue type and reason): She drank water from a cup seated in the bedside chair.             Upper Body Dressing : Minimal assistance;Sitting Upper Body Dressing Details (indicate cue type and reason): The pt doffed a hospital gown, then donned another clean one seated on the bedside commode. She required min assist to pull gown all the way up over her shoulders. Lower Body Dressing: Total assistance Lower Body Dressing Details (indicate cue type and reason): For sock management in sitting. Toilet Transfer: Moderate assistance;Rolling walker (2 wheels);BSC/3in1 Toilet Transfer Details (indicate cue type and reason): Pt required mod assist and use of a RW to perform a step-pivot transfer to the bedside commode. She required constant steadying assist, as well as cues for RW placement as well as upright posture and to reach for arm rest, prior to sitting. Toileting- Clothing Manipulation and Hygiene: Maximal assistance;Sit to/from stand Toileting - Clothing Manipulation Details (indicate cue type and reason): Pt noted to have bowel movement & required significant assistance for hygiene/clean-up and clothing management. She was limited by unsteadiness in standing, dizziness, and inability to release walker with BUE, in order to safely perform hygiene in standing.             Vision Baseline Vision/History: 1 Wears glasses            Cognition Arousal: Alert Behavior During Therapy: WFL for tasks assessed/performed Overall Cognitive Status: Difficult to assess        Able to follow simple  commands, friendly                Pertinent Vitals/ Pain       Pain Assessment Pain Assessment: No/denies pain         Frequency  Min 1X/week        Progress Toward Goals  OT Goals(current goals can now be found in the care plan section)  Progress  towards OT goals: Progressing toward goals  Acute Rehab OT Goals OT Goal Formulation: With patient Time For Goal Achievement: 09/19/23 Potential to Achieve Goals: Good  Plan         AM-PAC OT "6 Clicks" Daily Activity     Outcome Measure   Help from another person eating meals?: A Little Help from another person taking care of personal grooming?: A Little Help from another person toileting, which includes using toliet, bedpan, or urinal?: A Lot Help from another person bathing (including washing, rinsing, drying)?: A Lot Help from another person to put on and taking off regular upper body clothing?: A Little Help from another person to put on and taking off regular lower body clothing?: Total 6 Click Score: 14    End of Session Equipment Utilized During Treatment: Rolling walker (2 wheels);Gait belt;Oxygen  OT Visit Diagnosis: Unsteadiness on feet (R26.81);Other abnormalities of gait and mobility (R26.89);Muscle weakness (generalized) (M62.81);Dizziness and giddiness (R42)   Activity Tolerance Other (comment) (Limited by dizziness with activity)   Patient Left in chair;with call bell/phone within reach;with chair alarm set   Nurse Communication Other (comment) (nurse cleared pt for therapy participation)        Time: 6962-9528 OT Time Calculation (min): 32 min  Charges: OT General Charges $OT Visit: 1 Visit OT Treatments $Self Care/Home Management : 8-22 mins $Therapeutic Activity: 8-22 mins     Reuben Likes, OTR/L 09/13/2023, 4:25 PM

## 2023-09-14 DIAGNOSIS — K623 Rectal prolapse: Secondary | ICD-10-CM | POA: Diagnosis not present

## 2023-09-14 LAB — BASIC METABOLIC PANEL
Anion gap: 11 (ref 5–15)
BUN: 40 mg/dL — ABNORMAL HIGH (ref 8–23)
CO2: 44 mmol/L — ABNORMAL HIGH (ref 22–32)
Calcium: 8.7 mg/dL — ABNORMAL LOW (ref 8.9–10.3)
Chloride: 88 mmol/L — ABNORMAL LOW (ref 98–111)
Creatinine, Ser: 0.98 mg/dL (ref 0.44–1.00)
GFR, Estimated: 54 mL/min — ABNORMAL LOW (ref >60)
Glucose, Bld: 116 mg/dL — ABNORMAL HIGH (ref 70–99)
Potassium: 3.6 mmol/L (ref 3.5–5.1)
Sodium: 143 mmol/L (ref 135–145)

## 2023-09-14 NOTE — Progress Notes (Addendum)
Physical Therapy Treatment Patient Details Name: Alexa Pham MRN: [id number] DOB: 12/11/1929 Today's Date: 09/14/2023   History of Present Illness Pt is a 89 y/o female presenting on 11/27 with rectal prolapse s/p surgery 11/27.  Medicine service was consulted for hypotension as well as AKI. PMH includes: HTN, breast cancer s/p  partial mastectomy L side, macular degeneration, CHF, MI.    PT Comments  Pt assisted with sitting EOB. Pt dizzy but improved within a couple minutes.  Pt reports not being OOB much recently.  Pt agreeable to attempt standing and was able to progress to ambulating 20 ft however limited by generalized weakness and fatigue.  Pt reports only wearing oxygen at night at baseline so removed oxygen during session.  SPO2 94% at rest on room air.  SPO2 dropped to 85% on room air during ambulation so 2L O2 [place] reapplied upon sitting in recliner.  Pt also reported dizziness upon sitting (vitals below).  Pt encouraged to remain OOB in recliner for at least an hour (RN notified and aware).  Current d/c plan remains appropriate.  Patient will benefit from continued inpatient follow up therapy, <3 hours/day  *Pelvic PT order received however acute PT does not have any specialized or trained PT's to provide this skilled service.  Would recommend f/u with OP pelvic PT upon d/c if needed after post acute rehab.   09/14/23 1512  Vital Signs  Pulse Rate 92  BP (!) 162/93  BP Location Right Arm  BP Method Automatic  Patient Position (if appropriate) Sitting (after ambulation)  Oxygen Therapy  SpO2 (!) 84 %  O2 Device Room Air    09/14/23 1513  Vital Signs  Pulse Rate 81  Oxygen Therapy  SpO2 95 %  O2 Device Nasal Cannula  O2 Flow Rate (L/min) 2 L/min  Patient Activity (if Appropriate) In chair       If plan is discharge home, recommend the following: A lot of help with walking and/or transfers;Assistance with cooking/housework;Help with stairs or ramp for entrance;Assist for  transportation;A lot of help with bathing/dressing/bathroom;Direct supervision/assist for medications management;Direct supervision/assist for financial management   Can travel by private vehicle     No  Equipment Recommendations  None recommended by PT    Recommendations for Other Services       Precautions / Restrictions Precautions Precautions: Fall Precaution Comments: monitor sats     Mobility  Bed Mobility Overal bed mobility: Needs Assistance Bed Mobility: Supine to Sit Rolling: Min assist Sidelying to sit: Mod assist       General bed mobility comments: cues for log roll technique, assist for trunk upright    Transfers Overall transfer level: Needs assistance Equipment used: Rolling walker (2 wheels) Transfers: Sit to/from Stand Sit to Stand: Min assist           General transfer comment: assist to rise and steady, cues for hand placement    Ambulation/Gait Ambulation/Gait assistance: Min assist Gait Distance (Feet): 20 Feet Assistive device: Rolling walker (2 wheels) Gait Pattern/deviations: Step-through pattern, Decreased stride length, Narrow base of support, Trunk flexed Gait velocity: decr     General Gait Details: verbal cues for RW positioning, assist for stabilizing, distance limited due to fatigue and weakness   Stairs             Wheelchair Mobility     Tilt Bed    Modified Rankin (Stroke Patients Only)       Balance Overall balance assessment: Needs assistance  Standing balance support: Bilateral upper extremity supported, Reliant on assistive device for balance, During functional activity Standing balance-Leahy Scale: Poor                              Cognition Arousal: Alert Behavior During Therapy: WFL for tasks assessed/performed Overall Cognitive Status: Difficult to assess                                 General Comments: appropriate and following directions/cues with  increased time        Exercises      General Comments        Pertinent Vitals/Pain Pain Assessment Pain Assessment: Faces Faces Pain Scale: Hurts little more Pain Location: all over Pain Descriptors / Indicators: Grimacing, Guarding Pain Intervention(s): Monitored during session, Repositioned (improved at rest)    Home Living                          Prior Function            PT Goals (current goals can now be found in the care plan section) Acute Rehab PT Goals PT Goal Formulation: With patient Time For Goal Achievement: 09/21/23 Potential to Achieve Goals: Good Progress towards PT goals: Progressing toward goals    Frequency    Min 1X/week      PT Plan      Co-evaluation              AM-PAC PT "6 Clicks" Mobility   Outcome Measure  Help needed turning from your back to your side while in a flat bed without using bedrails?: A Lot Help needed moving from lying on your back to sitting on the side of a flat bed without using bedrails?: A Lot Help needed moving to and from a bed to a chair (including a wheelchair)?: A Lot Help needed standing up from a chair using your arms (e.g., wheelchair or bedside chair)?: A Lot Help needed to walk in hospital room?: A Lot Help needed climbing 3-5 steps with a railing? : Total 6 Click Score: 11    End of Session Equipment Utilized During Treatment: Gait belt Activity Tolerance: Patient limited by fatigue Patient left: in chair;with call bell/phone within reach;with chair alarm set Nurse Communication: Mobility status PT Visit Diagnosis: Difficulty in walking, not elsewhere classified (R26.2);Muscle weakness (generalized) (M62.81)     Time: 1500-1520 PT Time Calculation (min) (ACUTE ONLY): 20 min  Charges:    $Gait Training: 8-22 mins PT General Charges $$ ACUTE PT VISIT: 1 Visit                     Alexa Pham, DPT Physical Therapist Acute Rehabilitation Services Office:  [phone]    Alexa Pham 09/14/2023, 3:46 PM

## 2023-09-14 NOTE — Progress Notes (Signed)
10 Days Post-Op   Subjective/Chief Complaint: Pt doing well Tol PO   Objective: Vital signs in last 24 hours: Temp:  [98.2 F (36.8 C)-98.5 F (36.9 C)] 98.4 F (36.9 C) (12/07 0523) Pulse Rate:  [71-83] 83 (12/07 0523) Resp:  [16-18] 18 (12/07 0523) BP: (127-149)/(54-71) 134/71 (12/07 0523) SpO2:  [87 %-99 %] 99 % (12/07 0732) Weight:  [50.9 kg] 50.9 kg (12/07 0500) Last BM Date : 09/13/23  Intake/Output from previous day: 12/06 0701 - 12/07 0700 In: 1070 [P.O.:1070] Out: 1650 [Urine:1650] Intake/Output this shift: No intake/output data recorded.  PE:  Constitutional: No acute distress, conversant, appears states age. Eyes: Anicteric sclerae, moist conjunctiva, no lid lag Lungs: Clear to auscultation bilaterally, normal respiratory effort CV: regular rate and rhythm, no murmurs, no peripheral edema, pedal pulses 2+ GI: Soft, no masses or hepatosplenomegaly, non-tender to palpation Skin: No rashes, palpation reveals normal turgor Psychiatric: appropriate judgment and insight, oriented to person, place, and time   Lab Results:  Recent Labs    09/12/23 0854  WBC 6.9  HGB 11.3*  HCT 35.7*  PLT 331   BMET Recent Labs    09/12/23 0854 09/13/23 0440  NA 138 142  K 4.3 3.8  CL 95* 91*  CO2 36* 43*  GLUCOSE 83 132*  BUN 19 31*  CREATININE 0.84 1.14*  CALCIUM 8.6* 8.6*   PT/INR No results for input(s): "LABPROT", "INR" in the last 72 hours. ABG No results for input(s): "PHART", "HCO3" in the last 72 hours.  Invalid input(s): "PCO2", "PO2"  Studies/Results: No results found.  Anti-infectives: Anti-infectives (From admission, onward)    Start     Dose/Rate Route Frequency Ordered Stop   09/09/23 1000  fluconazole (DIFLUCAN) tablet 100 mg       Placed in "Followed by" Linked Group   100 mg Oral Daily 09/08/23 0749 09/14/23 0757   09/08/23 0845  fluconazole (DIFLUCAN) tablet 200 mg       Placed in "Followed by" Linked Group   200 mg Oral  Once  09/08/23 0749 09/08/23 0851   09/04/23 2000  cefoTEtan (CEFOTAN) 2 g in sodium chloride 0.9 % 100 mL IVPB        2 g 200 mL/hr over 30 Minutes Intravenous Every 12 hours 09/04/23 1413 09/04/23 2119   09/04/23 0700  cefoTEtan (CEFOTAN) 2 g in sodium chloride 0.9 % 100 mL IVPB        2 g 200 mL/hr over 30 Minutes Intravenous On call to O.R. 09/04/23 4098 09/04/23 0851       Assessment/Plan: s/p Procedure(s): ROBOTIC RECTOPEXY, RESECTION OF RECTOSIGMOID COLON (N/A) FLEXIBLE SIGMOIDOSCOPY (N/A) -tol PO well -con't to mobilize  Dispo: SNF monday   LOS: 10 days    Axel Filler 09/14/2023

## 2023-09-14 NOTE — Plan of Care (Signed)
  Problem: Respiratory: Goal: Respiratory status will improve Outcome: Progressing   Problem: Skin Integrity: Goal: Will show signs of wound healing Outcome: Progressing   Problem: Education: Goal: Knowledge of General Education information will improve Description: Including pain rating scale, medication(s)/side effects and non-pharmacologic comfort measures Outcome: Progressing

## 2023-09-14 NOTE — Progress Notes (Signed)
PROGRESS NOTE    Alexa Pham  YQM:[id number] DOB: [birth year] DOA: 09/04/2023 PCP: Dale Stephenson, MD   Brief Narrative:  PMH of asthma, CAD, CHF, hypothyroidism, HLD, HTN, hard of hearing, pulmonary hypertension presented for robotic low anterior rectosigmoid resection with anastomosis. Medicine service was consulted for hypotension as well as AKI.  Assessment & Plan:   Principal Problem:   Complete rectal prolapse Active Problems:   Hypertension   Hypothyroidism   Hypercholesterolemia   Aortic atherosclerosis (HCC)   CAD (coronary artery disease)   Urge incontinence   Rectal prolapse   History of non-ST elevation myocardial infarction (NSTEMI)   Chronic anticoagulation   SUI (stress urinary incontinence, female)   Asthma   History of coronary artery stent placement 2021   AKI (acute kidney injury) (HCC)   Hypotension   Macrocytic anemia   Hypocalcemia   Moderate protein malnutrition (HCC)   Delirium   Hypophosphatemia   Protein-calorie malnutrition, severe  AKI. Baseline serum creatinine 0.9. On admission serum creatinine 1.5 worsened to 1.7 but improved and back to normal until this morning 09/13/2023 when creatinine rose slightly to 1.14 but back to normal today.   Hypophosphatemia. Resolved.   Acute delirium, likely sundowning. Undiagnosed dementia. Fully alert and oriented.  Resolved. 1. Avoid benzodiazepines, antihistamines, anticholinergics, and minimize opiate use as these may worsen delirium. 2: Assess, prevent and manage pain as lack of treatment can result in delirium.  3: Provide appropriate lighting and clear signage; a clock and calendar should be easily visible to the patient. 4: Monitor environmental factors. Reduce light and noise at night (close shades, turn off lights, turn off TV, ect). Correct any alterations in sleep cycle. 5: Reorient the patient to person, place, time and situation on each encounter.  6: Correct sensory deficits if  possible (replace eye glasses, hearing aids, ect). 7: Avoid restraints if able. Severely delirious patients benefit from constant observation by a sitter.   Dysphagia Oral thrush Speech was consulted, due to lethargy and inability to follow commands recommended n.p.o. but patient is more alert now and I noticed that she has been advanced to regular diet by primary service/general surgery.   History of hypertension but now with hypotension. On amlodipine 2.5 mg as well as Coreg 6.25 mg twice daily at home with Lasix and irbesartan.  Currently only on Coreg 3.125 mg p.o. twice daily and blood pressure control.   Persistent nausea. Likely postop.  Resolved.   CAD. Currently on aspirin and Plavix.   HLD. On Lipitor.   Hypothyroidism. On Synthroid 150 mcg. TSH 2.56 in June. TSH and free T4 adequate.  Would not change dose for now.   Prediabetes. Hemoglobin A1c 5.8. Continue sensitive sliding scale.    Acute hypoxic respiratory failure secondary to acute on chronic congestive heart failure with preserved ejection fraction/bilateral pleural effusion: Nocturnal hypoxia. Bilateral small pleural effusion Resolved.  Patient is on room air again today.  She appears dry clinically.  No crackles.  Creatinine improved.  I do not think she needs any further diuresis.  All patient's medical issues are stable.  Nothing more for hospitalist to manage actively.  We will sign off.  Please let us know if ever services are required again.  Appreciate the opportunity to care for this patient.  DVT prophylaxis: enoxaparin (LOVENOX) injection 30 mg Start: 09/06/23 0800 SCD's Start: 09/04/23 1414   Code Status: Full Code  Family Communication:  None present at bedside.  Plan of care discussed with patient in length and  he/she verbalized understanding and agreed with it.  Status is: Inpatient Remains inpatient appropriate because: Disposition per primary service/general surgery   Estimated body mass  index is 18.11 kg/m as calculated from the following:   Height as of this encounter: 5\' 6"  (1.676 m).   Weight as of this encounter: 50.9 kg.    Nutritional Assessment: Body mass index is 18.11 kg/m.Marland Kitchen Seen by dietician.  I agree with the assessment and plan as outlined below: Nutrition Status: Nutrition Problem: Severe Malnutrition Etiology: chronic illness Signs/Symptoms: severe fat depletion, severe muscle depletion Interventions: Ensure Enlive (each supplement provides 350kcal and 20 grams of protein), MVI, Calorie Count  . Skin Assessment: I have examined the patient's skin and I agree with the wound assessment as performed by the wound care RN as outlined below:    Consultants:  [facility] hospitalist  Procedures:  As above  Antimicrobials:  Anti-infectives (From admission, onward)    Start     Dose/Rate Route Frequency Ordered Stop   09/09/23 1000  fluconazole (DIFLUCAN) tablet 100 mg       Placed in "Followed by" Linked Group   100 mg Oral Daily 09/08/23 0749 09/14/23 0757   09/08/23 0845  fluconazole (DIFLUCAN) tablet 200 mg       Placed in "Followed by" Linked Group   200 mg Oral  Once 09/08/23 0749 09/08/23 0851   09/04/23 2000  cefoTEtan (CEFOTAN) 2 g in sodium chloride 0.9 % 100 mL IVPB        2 g 200 mL/hr over 30 Minutes Intravenous Every 12 hours 09/04/23 1413 09/04/23 2119   09/04/23 0700  cefoTEtan (CEFOTAN) 2 g in sodium chloride 0.9 % 100 mL IVPB        2 g 200 mL/hr over 30 Minutes Intravenous On call to O.R. 09/04/23 1324 09/04/23 0851         Subjective: Patient seen and examined.  She has no complaints.  Objective: Vitals:   09/13/23 2216 09/14/23 0500 09/14/23 0523 09/14/23 0732  BP: (!) 143/56  134/71   Pulse: 79  83   Resp: 18  18   Temp: 98.4 F (36.9 C)  98.4 F (36.9 C)   TempSrc: Oral  Oral   SpO2: 98%  98% 99%  Weight:  50.9 kg    Height:        Intake/Output Summary (Last 24 hours) at 09/14/2023 1257 Last data filed at  09/14/2023 1216 Gross per 24 hour  Intake 1070 ml  Output 1350 ml  Net -280 ml   Filed Weights   09/12/23 0500 09/13/23 0500 09/14/23 0500  Weight: 51.4 kg 50.7 kg 50.9 kg    Examination:  General exam: Appears calm and comfortable  Respiratory system: Clear to auscultation. Respiratory effort normal. Cardiovascular system: S1 & S2 heard, RRR. No JVD, murmurs, rubs, gallops or clicks. No pedal edema. Gastrointestinal system: Abdomen is nondistended, soft and nontender. No organomegaly or masses felt. Normal bowel sounds heard. Central nervous system: Alert and oriented. No focal neurological deficits. Extremities: Symmetric 5 x 5 power. Skin: No rashes, lesions or ulcers.    Data Reviewed: I have personally reviewed following labs and imaging studies  CBC: Recent Labs  Lab 09/08/23 0412 09/09/23 0417 09/10/23 0446 09/11/23 0445 09/12/23 0854  WBC 8.4 9.3 7.9 7.6 6.9  NEUTROABS  --   --   --   --  4.7  HGB 11.3* 10.5* 11.0* 10.9* 11.3*  HCT 37.4 34.4* 34.4* 34.6* 35.7*  MCV 101.6*  102.7* 98.9 98.9 98.6  PLT 246 233 273 293 331   Basic Metabolic Panel: Recent Labs  Lab 09/08/23 0412 09/09/23 0417 09/10/23 0446 09/11/23 0445 09/12/23 0854 09/13/23 0440 09/14/23 1115  NA 136 135 139 137 138 142 143  K 5.0 5.1 3.8 4.3 4.3 3.8 3.6  CL 105 107 103 99 95* 91* 88*  CO2 28 25 31  34* 36* 43* 44*  GLUCOSE 146* 139* 141* 116* 83 132* 116*  BUN 29* 18 13 15 19  31* 40*  CREATININE 1.16* 0.93 0.81 0.74 0.84 1.14* 0.98  CALCIUM 8.9 8.7* 8.0* 8.3* 8.6* 8.6* 8.7*  MG 1.9 1.9 1.7  --   --   --   --   PHOS 2.5  2.5 2.2* 1.8* 3.1  --   --   --    GFR: Estimated Creatinine Clearance: 30 mL/min (by C-G formula based on SCr of 0.98 mg/dL). Liver Function Tests: Recent Labs  Lab 09/08/23 0412 09/09/23 0417 09/10/23 0446 09/11/23 0445  ALBUMIN 2.7* 2.4* 2.4* 2.4*   No results for input(s): "LIPASE", "AMYLASE" in the last 168 hours. No results for input(s): "AMMONIA" in  the last 168 hours. Coagulation Profile: No results for input(s): "INR", "PROTIME" in the last 168 hours. Cardiac Enzymes: No results for input(s): "CKTOTAL", "CKMB", "CKMBINDEX", "TROPONINI" in the last 168 hours. BNP (last 3 results) No results for input(s): "PROBNP" in the last 8760 hours. HbA1C: No results for input(s): "HGBA1C" in the last 72 hours. CBG: No results for input(s): "GLUCAP" in the last 168 hours. Lipid Profile: No results for input(s): "CHOL", "HDL", "LDLCALC", "TRIG", "CHOLHDL", "LDLDIRECT" in the last 72 hours. Thyroid Function Tests: No results for input(s): "TSH", "T4TOTAL", "FREET4", "T3FREE", "THYROIDAB" in the last 72 hours. Anemia Panel: No results for input(s): "VITAMINB12", "FOLATE", "FERRITIN", "TIBC", "IRON", "RETICCTPCT" in the last 72 hours. Sepsis Labs: No results for input(s): "PROCALCITON", "LATICACIDVEN" in the last 168 hours.  No results found for this or any previous visit (from the past 240 hour(s)).   Radiology Studies: No results found.  Scheduled Meds:  acetaminophen  500 mg Oral TID WC & HS   albuterol  2.5 mg Nebulization BID   aspirin EC  81 mg Oral Daily   carvedilol  3.125 mg Oral BID WC   clopidogrel  75 mg Oral Daily   enoxaparin (LOVENOX) injection  30 mg Subcutaneous Q24H   feeding supplement  237 mL Oral TID BM   hydrocerin   Topical BID   levothyroxine  150 mcg Oral Daily   metoCLOPramide  5 mg Oral Q breakfast   metoCLOPramide  5 mg Oral Q supper   polycarbophil  625 mg Oral BID   simethicone  40 mg Oral QID   sodium chloride flush  3 mL Intravenous Q12H   Continuous Infusions:  sodium chloride       LOS: 10 days   Hughie Closs, MD [facility]  09/14/2023, 12:57 PM   *Please note that this is a verbal dictation therefore any spelling or grammatical errors are due to the "Dragon [employer] One" system interpretation.  Please page via Amion and do not message via secure chat for urgent patient care matters.  Secure chat can be used for non urgent patient care matters.  How to contact the [facility] Attending or Consulting provider 7A - 7P or covering provider during after hours 7P -7A, for this patient?  Check the care team in [facility] and look for a) attending/consulting [facility] provider listed and b)  the [facility] team listed. Page or secure chat 7A-7P. Log into [email or web address] and use [place] universal password to access. If you do not have the password, please contact the hospital operator. Locate the [facility] provider you are looking for under [facility] and page to a number that you can be directly reached. If you still have difficulty reaching the provider, please page the [facility] (Director on Call) for the Hospitalists listed on amion for assistance.

## 2023-09-15 NOTE — Plan of Care (Signed)
  Problem: Nutrition: Goal: Adequate nutrition will be maintained Outcome: Progressing   

## 2023-09-15 NOTE — Progress Notes (Signed)
11 Days Post-Op   Subjective/Chief Complaint: Comfortable this morning No complaints Had BM's   Objective: Vital signs in last 24 hours: Temp:  [97.4 F (36.3 C)-98.8 F (37.1 C)] 97.9 F (36.6 C) (12/08 0443) Pulse Rate:  [68-92] 68 (12/08 0443) Resp:  [16-17] 16 (12/08 0443) BP: (153-178)/(63-93) 155/66 (12/08 0443) SpO2:  [84 %-99 %] 96 % (12/08 0443) Weight:  [49.5 kg] 49.5 kg (12/08 0500) Last BM Date : 09/15/23  Intake/Output from previous day: 12/07 0701 - 12/08 0700 In: 973 [P.O.:970; I.V.:3] Out: 550 [Urine:550] Intake/Output this shift: No intake/output data recorded.  Exam: Awake and alert Comfortable Abdomen soft, non-distended  Lab Results:  Recent Labs    09/12/23 0854  WBC 6.9  HGB 11.3*  HCT 35.7*  PLT 331   BMET Recent Labs    09/13/23 0440 09/14/23 1115  NA 142 143  K 3.8 3.6  CL 91* 88*  CO2 43* 44*  GLUCOSE 132* 116*  BUN 31* 40*  CREATININE 1.14* 0.98  CALCIUM 8.6* 8.7*   PT/INR No results for input(s): "LABPROT", "INR" in the last 72 hours. ABG No results for input(s): "PHART", "HCO3" in the last 72 hours.  Invalid input(s): "PCO2", "PO2"  Studies/Results: No results found.  Anti-infectives: Anti-infectives (From admission, onward)    Start     Dose/Rate Route Frequency Ordered Stop   09/09/23 1000  fluconazole (DIFLUCAN) tablet 100 mg       Placed in "Followed by" Linked Group   100 mg Oral Daily 09/08/23 0749 09/14/23 0757   09/08/23 0845  fluconazole (DIFLUCAN) tablet 200 mg       Placed in "Followed by" Linked Group   200 mg Oral  Once 09/08/23 0749 09/08/23 0851   09/04/23 2000  cefoTEtan (CEFOTAN) 2 g in sodium chloride 0.9 % 100 mL IVPB        2 g 200 mL/hr over 30 Minutes Intravenous Every 12 hours 09/04/23 1413 09/04/23 2119   09/04/23 0700  cefoTEtan (CEFOTAN) 2 g in sodium chloride 0.9 % 100 mL IVPB        2 g 200 mL/hr over 30 Minutes Intravenous On call to O.R. 09/04/23 3474 09/04/23 0851        Assessment/Plan: s/p Procedure(s): ROBOTIC RECTOPEXY, RESECTION OF RECTOSIGMOID COLON (N/A) FLEXIBLE SIGMOIDOSCOPY (N/A)   -continue current care -likely SNF tomorrow   LOS: 11 days    Abigail Miyamoto 09/15/2023

## 2023-09-16 DIAGNOSIS — N182 Chronic kidney disease, stage 2 (mild): Secondary | ICD-10-CM | POA: Diagnosis present

## 2023-09-16 LAB — COMPREHENSIVE METABOLIC PANEL
ALT: 40 U/L (ref 0–44)
AST: 45 U/L — ABNORMAL HIGH (ref 15–41)
Albumin: 2.6 g/dL — ABNORMAL LOW (ref 3.5–5.0)
Alkaline Phosphatase: 56 U/L (ref 38–126)
BUN: 47 mg/dL — ABNORMAL HIGH (ref 8–23)
CO2: >45 mmol/L — ABNORMAL HIGH (ref 22–32)
Calcium: 8.7 mg/dL — ABNORMAL LOW (ref 8.9–10.3)
Chloride: 92 mmol/L — ABNORMAL LOW (ref 98–111)
Creatinine, Ser: 0.98 mg/dL (ref 0.44–1.00)
GFR, Estimated: 54 mL/min — ABNORMAL LOW (ref >60)
Glucose, Bld: 114 mg/dL — ABNORMAL HIGH (ref 70–99)
Potassium: 3.7 mmol/L (ref 3.5–5.1)
Sodium: 145 mmol/L (ref 135–145)
Total Bilirubin: 0.4 mg/dL (ref <1.2)
Total Protein: 5.9 g/dL — ABNORMAL LOW (ref 6.5–8.1)

## 2023-09-16 LAB — PREALBUMIN: Prealbumin: 17 mg/dL — ABNORMAL LOW (ref 18–38)

## 2023-09-16 MED ORDER — FLUTICASONE FUROATE-VILANTEROL 100-25 MCG/ACT IN AEPB
1.0000 | INHALATION_SPRAY | Freq: Every day | RESPIRATORY_TRACT | Status: DC
  Administered 2023-09-17: 1 via RESPIRATORY_TRACT
  Filled 2023-09-16: qty 28

## 2023-09-16 MED ORDER — LOPERAMIDE HCL 2 MG PO CAPS
2.0000 mg | ORAL_CAPSULE | Freq: Every day | ORAL | Status: DC
  Administered 2023-09-16: 2 mg via ORAL
  Filled 2023-09-16: qty 1

## 2023-09-16 MED ORDER — HYDROCODONE-ACETAMINOPHEN 5-325 MG PO TABS: 1.0000 | ORAL_TABLET | Freq: Four times a day (QID) | ORAL | 0 refills | Status: DC | PRN

## 2023-09-16 MED ORDER — IRBESARTAN 150 MG PO TABS: 150.0000 mg | ORAL_TABLET | Freq: Every day | ORAL | 11 refills | Status: DC

## 2023-09-16 MED ORDER — UMECLIDINIUM BROMIDE 62.5 MCG/ACT IN AEPB
1.0000 | INHALATION_SPRAY | Freq: Every day | RESPIRATORY_TRACT | Status: DC
  Administered 2023-09-17: 1 via RESPIRATORY_TRACT
  Filled 2023-09-16: qty 7

## 2023-09-16 MED ORDER — HYDROCODONE-ACETAMINOPHEN 5-325 MG PO TABS: 1.0000 | ORAL_TABLET | ORAL | Status: DC | PRN

## 2023-09-16 MED ORDER — ACETAMINOPHEN 500 MG PO TABS: 500.0000 mg | ORAL_TABLET | Freq: Four times a day (QID) | ORAL | Status: DC | PRN

## 2023-09-16 MED ORDER — HYDRALAZINE HCL 25 MG PO TABS: 25.0000 mg | ORAL_TABLET | Freq: Four times a day (QID) | ORAL | Status: DC | PRN

## 2023-09-16 MED ORDER — OYSTER SHELL CALCIUM/D3 500-5 MG-MCG PO TABS
1.0000 | ORAL_TABLET | Freq: Two times a day (BID) | ORAL | Status: DC
Start: 2023-09-16 — End: 2023-09-17
  Administered 2023-09-16 – 2023-09-17 (×2): 1 via ORAL
  Filled 2023-09-16 (×2): qty 1

## 2023-09-16 MED ORDER — FUROSEMIDE 20 MG PO TABS
20.0000 mg | ORAL_TABLET | Freq: Every day | ORAL | Status: DC
  Administered 2023-09-16: 20 mg via ORAL
  Filled 2023-09-16: qty 1

## 2023-09-16 MED ORDER — FUROSEMIDE 20 MG PO TABS: 20.0000 mg | ORAL_TABLET | Freq: Every day | ORAL | 2 refills | Status: DC | PRN

## 2023-09-16 MED ORDER — IRBESARTAN 150 MG PO TABS
300.0000 mg | ORAL_TABLET | Freq: Every day | ORAL | Status: DC
  Administered 2023-09-17: 300 mg via ORAL
  Filled 2023-09-16: qty 2

## 2023-09-16 MED ORDER — IRBESARTAN 150 MG PO TABS
150.0000 mg | ORAL_TABLET | Freq: Every day | ORAL | Status: DC
  Administered 2023-09-16: 150 mg via ORAL
  Filled 2023-09-16: qty 1

## 2023-09-16 MED ORDER — CALCIUM POLYCARBOPHIL 625 MG PO TABS: 625.0000 mg | ORAL_TABLET | Freq: Two times a day (BID) | ORAL | 10 refills | Status: DC

## 2023-09-16 MED ORDER — METOCLOPRAMIDE HCL 5 MG PO TABS: 5.0000 mg | ORAL_TABLET | Freq: Every day | ORAL | 2 refills | Status: DC

## 2023-09-16 MED ORDER — HYDRALAZINE HCL 10 MG PO TABS
10.0000 mg | ORAL_TABLET | Freq: Four times a day (QID) | ORAL | Status: DC | PRN
Start: 2023-09-16 — End: 2023-09-17

## 2023-09-16 MED ORDER — CARVEDILOL 6.25 MG PO TABS
6.2500 mg | ORAL_TABLET | Freq: Two times a day (BID) | ORAL | Status: DC
  Administered 2023-09-17: 6.25 mg via ORAL
  Filled 2023-09-16: qty 1

## 2023-09-16 MED ORDER — AMLODIPINE BESYLATE 5 MG PO TABS
2.5000 mg | ORAL_TABLET | Freq: Two times a day (BID) | ORAL | Status: DC
  Administered 2023-09-16: 2.5 mg via ORAL
  Filled 2023-09-16: qty 1

## 2023-09-16 MED ORDER — ATORVASTATIN CALCIUM 20 MG PO TABS
40.0000 mg | ORAL_TABLET | Freq: Every day | ORAL | Status: DC
  Administered 2023-09-16 – 2023-09-17 (×2): 40 mg via ORAL
  Filled 2023-09-16 (×2): qty 2

## 2023-09-16 MED ORDER — LOPERAMIDE HCL 2 MG PO CAPS
2.0000 mg | ORAL_CAPSULE | Freq: Once | ORAL | Status: AC
  Administered 2023-09-16: 2 mg via ORAL
  Filled 2023-09-16: qty 1

## 2023-09-16 MED ORDER — LOPERAMIDE HCL 2 MG PO CAPS: 2.0000 mg | ORAL_CAPSULE | Freq: Every day | ORAL | 2 refills | Status: DC

## 2023-09-16 NOTE — TOC Progression Note (Signed)
Transition of Care [facility]) - Progression Note    Patient Details  Name: Alexa Pham MRN: [id number] Date of Birth: 19-Apr-1930  Transition of Care [facility]) CM/SW Contact  Geni Bers, RN Phone Number: 09/16/2023, 9:04 AM  Clinical Narrative:     A call was made to pt's son concerning bed offers. Waiting for a return call.   Expected Discharge Plan: Skilled Nursing Facility Barriers to Discharge: Continued Medical Work up, SNF Pending bed offer  Expected Discharge Plan and Services In-house Referral: Clinical Social Work Discharge Planning Services: NA Post Acute Care Choice: Skilled Nursing Facility Living arrangements for the past 2 months: Single Family Home Expected Discharge Date: 09/16/23               DME Arranged: N/A DME Agency: NA                   Social Determinants of Health (SDOH) Interventions SDOH Screenings   Food Insecurity: No Food Insecurity (09/04/2023)  Housing: Low Risk  (09/04/2023)  Transportation Needs: No Transportation Needs (09/04/2023)  Utilities: Not At Risk (09/04/2023)  Depression (PHQ2-9): Low Risk  (04/04/2023)  Financial Resource Strain: Low Risk  (10/22/2022)  Physical Activity: Insufficiently Active (10/22/2022)  Social Connections: Moderately Integrated (10/22/2022)  Stress: No Stress Concern Present (10/22/2022)  Tobacco Use: Medium Risk (09/04/2023)    Readmission Risk Interventions    09/06/2023    1:20 PM  Readmission Risk Prevention Plan  Transportation Screening Complete  PCP or Specialist Appt within 5-7 Days Complete  Home Care Screening Complete  Medication Review (RN CM) Complete

## 2023-09-16 NOTE — Care Management Important Message (Signed)
Important Message  Patient Details IM Letter given. Name: Alexa Pham MRN: [id number] Date of Birth: Mar 07, 1930   Important Message Given:  Yes - Medicare IM     Caren Macadam 09/16/2023, 9:38 AM

## 2023-09-16 NOTE — Discharge Summary (Addendum)
Physician Discharge Summary    Alexa Pham MRN: [id number] DOB/AGE: 05-29-1930 = 89 y.o.  Patient Care Team: Dale San Fernando, MD as PCP - General (Internal Medicine) Salena Saner, MD as Consulting Physician (Pulmonary Disease) Elige Radon, MD as Referring Physician (Cardiology) Regis Bill, MD as Consulting Physician (Gastroenterology) Karie Soda, MD as Consulting Physician (General Surgery) Lamar Blinks, MD as Referring Physician (Cardiology) Vanna Scotland, MD as Consulting Physician (Urology)  Admit date: 09/04/2023  Discharge date: 09/16/2023  Hospital Stay = 12 days    Discharge Diagnoses:  Principal Problem:   Complete rectal prolapse Active Problems:   Hypertension   Hypothyroidism   Hypercholesterolemia   Aortic atherosclerosis (HCC)   CAD (coronary artery disease)   Urge incontinence   Rectal prolapse   History of non-ST elevation myocardial infarction (NSTEMI)   Chronic anticoagulation   SUI (stress urinary incontinence, female)   Asthma   History of coronary artery stent placement 2021   AKI (acute kidney injury) (HCC)   Hypotension   Macrocytic anemia   Hypocalcemia   Moderate protein malnutrition (HCC)   Delirium   Hypophosphatemia   Protein-calorie malnutrition, severe   CKD (chronic kidney disease) stage 2, GFR 60-89 ml/min   12 Days Post-Op  09/04/2023    POST-OPERATIVE DIAGNOSIS:  RECTAL PROLAPSE   PROCEDURE:   ROBOTIC LOW ANTERIOR RECTOSIGMOID RESECTION WITH ANASTOMOSIS ROBOTIC RECTOPEXY FLEXIBLE SIGMOIDOSCOPY TRANSVERSUS ABDOMINIS PLANE (TAP) BLOCK - BILATERAL   Surgeons:  Karie Soda, MD   OR FINDINGS:  Very redundant sigmoid colon resting in the low pelvis with some adhesions at the left adnexa.  1/2 feet of redundant rectosigmoid colon excised.   Patient has a descending colon to proximal rectal 31 EEA stapled anastomosis that rests 12-13 cm from the anal verge   FINAL MICROSCOPIC DIAGNOSIS:    A. COLON, RECTO SIGMOID, RESECTION:  - Segment of colon (18 cm) showing diverticulosis with associated  reactive changes  - Margins appear viable   B. COLON, FINAL DISTAL MARGIN OF ANASTAMOTIC RING, EXCISION:  - Colonic donut with no specific histologic changes      Consults: Case Management / Social Work, Physical Therapy, Occupational Therapy, Speech Therapy, Pharmacy, Nutrition, Anesthesia, and Internal Medicine (Hospitalist)  Hospital Course:   The patient underwent  the surgery above.  Postoperatively, the patient struggled with decreased urine output and confusion.  Became rather sedated.  Internal medicine was consulted.  She was gradually rehydrated.  Blood pressure was somewhat soft but did not require transfusion.  CT of head ruled out stroke.  Electrolytes such as sodium, potassium, phosphorus, etc. were monitored and replaced and adjusted as needed.  Patient placed on oxygen.  Medications such as narcotics and other regimens were completely switched.  That seemed to help as well.  Patient denied much in the way of abdominal pain so narcotic use was minimal.  Gradually patient had return of bowel function and was able to advance on diet.  She did have some confusion with difficulty swallowing at first with speech therapy follow-up.  We placed on a dysphagia diet as first.  By the time of discharge her mental status had markedly improved and she was back to eating a solid diet which she normally did at home.  Did have some occasional nausea and vomiting.  Improved with some intermittent oral metoclopramide.  He is on a fiber bowel regimen with return of bowel function.  Added Imodium at bedtime to help patient still having some fecal incontinence with leaking  given her weak sphincter tone but no recurrent prolapse.  Pathology consistent with inflammation and no cancer or tumor or proctitis  She did have some dehydration with fluid replacement.  Then later was diuresed.  Weaned on  her oxygen.  No proof of any aspiration event or urosepsis.  No peripheral anastomotic leak.  She started back on her Plavix anticoagulation with no recurrence and bleeding or concerns.  She is very deconditioned.  Physical and Occupational Therapy was involved and she was agreeable to improve with some moderate assistant.  Based on these concerns, I did not feel safe for her to immediately go home and therapy agreed.  Therefore recommended skilled nursing facility with rehab in the next few weeks in the hopes that she can get stronger and more independent and go back to living at home.  Son involved. Postoperative recommendations were discussed in detail.  They are written as well.  Discharged Condition: stable  Discharge Exam: Blood pressure (!) 175/74, pulse 71, temperature 98.9 F (37.2 C), temperature source Oral, resp. rate 16, height 5\' 6"  (1.676 m), weight 54.6 kg, SpO2 90%.  General: Pt awake/alert/oriented x4 in No acute distress Eyes: PERRL, normal EOM.  Sclera clear.  No icterus Neuro: CN II-XII intact w/o focal sensory/motor deficits. Lymph: No head/neck/groin lymphadenopathy Psych:  No delerium/psychosis/paranoia HENT: Normocephalic, Mucus membranes moist.  No thrush Neck: Supple, No tracheal deviation Chest:  No chest wall pain w good excursion CV:  Pulses intact.  Regular rhythm MS: Normal AROM mjr joints.  No obvious deformity Abdomen: Soft.  Nondistended.  Nontender.  No evidence of peritonitis.  No incarcerated hernias. GU: No vaginal bleeding or discharge.  Clear yellow urine Rectal: No recurrent prolapse.  Decreased but intact sphincter tone.  Some fecal soiling and mild pruritus. Ext:  SCDs BLE.  No mjr edema.  No cyanosis Skin: No petechiae / purpura   Disposition:    Follow-up Information     Karie Soda, MD Follow up on 10/14/2023.   Specialties: General Surgery, Colon and Rectal Surgery Why: To follow up after your hospital stay, To follow up after your  operation Contact information: [address] [place] [address] [phone]                 Discharge disposition: 03-Skilled Nursing Facility       Discharge Instructions     Call MD for:   Complete by: As directed    FEVER > 101.5 F  (temperatures < 101.5 F are not significant)   Call MD for:  extreme fatigue   Complete by: As directed    Call MD for:  persistant dizziness or light-headedness   Complete by: As directed    Call MD for:  persistant nausea and vomiting   Complete by: As directed    Call MD for:  redness, tenderness, or signs of infection (pain, swelling, redness, odor or green/yellow discharge around incision site)   Complete by: As directed    Call MD for:  severe uncontrolled pain   Complete by: As directed    Diet general   Complete by: As directed    Discharge instructions   Complete by: As directed    See Discharge Instructions If you are not getting better after two weeks or are noticing you are getting worse, contact our office [phone] for further advice.  We may need to adjust your medications, re-evaluate you in the office, send you to the emergency room, or see  what other things we can do to help. The clinic staff is available to answer your questions during regular business hours (8:30am-5pm).  Please don't hesitate to call and ask to speak to one of our nurses for clinical concerns.    A surgeon from [facility] is always on call at the hospitals 24 hours/day If you have a medical emergency, go to the nearest emergency room or call 911.   Discharge wound care:   Complete by: As directed    It is good for closed incisions and even open wounds to be washed every day.  Shower every day.  Short baths are fine.  Wash the incisions and wounds clean with soap & water.    You may leave closed incisions open to air if it is dry.   You may cover the incision with clean gauze & replace it after your daily shower  for comfort.   Driving Restrictions   Complete by: As directed    You may drive when: - you are no longer taking narcotic prescription pain medication - you can comfortably wear a seatbelt - you can safely make sudden turns/stops without pain.   Increase activity slowly   Complete by: As directed    Start light daily activities --- self-care, walking, climbing stairs- beginning the day after surgery.  Gradually increase activities as tolerated.  Control your pain to be active.  Stop when you are tired.  Ideally, walk several times a day, eventually an hour a day.   Most people are back to most day-to-day activities in a few weeks.  It takes 4-6 weeks to get back to unrestricted, intense activity. If you can walk 30 minutes without difficulty, it is safe to try more intense activity such as jogging, treadmill, bicycling, low-impact aerobics, swimming, etc. Save the most intensive and strenuous activity for last (Usually 4-8 weeks after surgery) such as sit-ups, heavy lifting, contact sports, etc.  Refrain from any intense heavy lifting or straining until you are off narcotics for pain control.  You will have off days, but things should improve week-by-week. DO NOT PUSH THROUGH PAIN.  Let pain be your guide: If it hurts to do something, don't do it.   Lifting restrictions   Complete by: As directed    If you can walk 30 minutes without difficulty, it is safe to try more intense activity such as jogging, treadmill, bicycling, low-impact aerobics, swimming, etc. Save the most intensive and strenuous activity for last (Usually 4-8 weeks after surgery) such as sit-ups, heavy lifting, contact sports, etc.   Refrain from any intense heavy lifting or straining until you are off narcotics for pain control.  You will have off days, but things should improve week-by-week. DO NOT PUSH THROUGH PAIN.  Let pain be your guide: If it hurts to do something, don't do it.  Pain is your body warning you to avoid that  activity for another week until the pain goes down.   May shower / Bathe   Complete by: As directed    May walk up steps   Complete by: As directed    Sexual Activity Restrictions   Complete by: As directed    You may have sexual intercourse when it is comfortable. If it hurts to do something, stop.       Allergies as of 09/16/2023       Reactions   Ticagrelor Shortness Of Breath   Extreme    Oxycodone Nausea Only  Medication List     TAKE these medications    albuterol (2.5 MG/3ML) 0.083% nebulizer solution Commonly known as: PROVENTIL Take 2.5 mg by nebulization 2 (two) times daily.   amLODipine 2.5 MG tablet Commonly known as: NORVASC Take 2.5 mg by mouth 2 (two) times daily.   aspirin EC 81 MG tablet Take 1 tablet (81 mg total) by mouth daily. Swallow whole.   atorvastatin 40 MG tablet Commonly known as: LIPITOR TAKE 1 TABLET(40 MG) BY MOUTH DAILY   calcium citrate-vitamin D 315-200 MG-UNIT tablet Commonly known as: CITRACAL+D Take 1 tablet by mouth 2 (two) times daily.   carvedilol 6.25 MG tablet Commonly known as: COREG Take 6.25 mg by mouth 2 (two) times daily with a meal.   clopidogrel 75 MG tablet Commonly known as: PLAVIX Take 1 tablet (75 mg total) by mouth daily.   furosemide 20 MG tablet Commonly known as: LASIX Take 1 tablet (20 mg total) by mouth daily as needed for edema (SBP > 180). What changed:  how much to take when to take this reasons to take this   HYDROcodone-acetaminophen 5-325 MG tablet Commonly known as: NORCO/VICODIN Take 1 tablet by mouth every 6 (six) hours as needed for severe pain (pain score 7-10).   irbesartan 150 MG tablet Commonly known as: AVAPRO Take 1 tablet (150 mg total) by mouth daily. Hold if SBP < 120 What changed: additional instructions   levothyroxine 150 MCG tablet Commonly known as: SYNTHROID Take 1 tablet (150 mcg total) by mouth daily.   loperamide 2 MG capsule Commonly known as:  IMODIUM Take 1 capsule (2 mg total) by mouth at bedtime. Hold if no BM in 24 hours   metoCLOPramide 5 MG tablet Commonly known as: REGLAN Take 1 tablet (5 mg total) by mouth daily with breakfast. Start taking on: September 17, 2023   polycarbophil 625 MG tablet Commonly known as: FIBERCON Take 1 tablet (625 mg total) by mouth 2 (two) times daily.   potassium chloride 10 MEQ tablet Commonly known as: KLOR-CON TAKE 1 TABLET(10 MEQ) BY MOUTH DAILY What changed: See the new instructions.   PRESERVISION AREDS 2 PO Take 1 tablet by mouth 2 (two) times daily.   sodium chloride HYPERTONIC 3 % nebulizer solution Take 4 mLs by nebulization in the morning and at bedtime.   Trelegy Ellipta 100-62.5-25 MCG/ACT Aepb Generic drug: Fluticasone-Umeclidin-Vilant Inhale 1 puff into the lungs daily.               Discharge Care Instructions  (From admission, onward)           Start     Ordered   09/16/23 0000  Discharge wound care:       Comments: It is good for closed incisions and even open wounds to be washed every day.  Shower every day.  Short baths are fine.  Wash the incisions and wounds clean with soap & water.    You may leave closed incisions open to air if it is dry.   You may cover the incision with clean gauze & replace it after your daily shower for comfort.   09/16/23 0843            Significant Diagnostic Studies:  Results for orders placed or performed during the hospital encounter of 09/04/23 (from the past 72 hour(s))  Basic metabolic panel     Status: Abnormal   Collection Time: 09/14/23 11:15 AM  Result Value Ref Range   Sodium 143 135 -  145 mmol/L   Potassium 3.6 3.5 - 5.1 mmol/L   Chloride 88 (L) 98 - 111 mmol/L   CO2 44 (H) 22 - 32 mmol/L   Glucose, Bld 116 (H) 70 - 99 mg/dL    Comment: Glucose reference range applies only to samples taken after fasting for at least 8 hours.   BUN 40 (H) 8 - 23 mg/dL   Creatinine, Ser 1.19 0.44 - 1.00 mg/dL    Calcium 8.7 (L) 8.9 - 10.3 mg/dL   GFR, Estimated 54 (L) >60 mL/min    Comment: (NOTE) Calculated using the CKD-EPI Creatinine Equation (2021)    Anion gap 11 5 - 15    Comment: Performed at [facility], [address], [place], [place] [address]  Prealbumin     Status: Abnormal   Collection Time: 09/16/23 12:22 AM  Result Value Ref Range   Prealbumin 17 (L) 18 - 38 mg/dL    Comment: Performed at [facility], [address] [place], [place] [address]  Comprehensive metabolic panel     Status: Abnormal   Collection Time: 09/16/23 12:22 AM  Result Value Ref Range   Sodium 145 135 - 145 mmol/L   Potassium 3.7 3.5 - 5.1 mmol/L   Chloride 92 (L) 98 - 111 mmol/L   CO2 >45 (H) 22 - 32 mmol/L   Glucose, Bld 114 (H) 70 - 99 mg/dL    Comment: Glucose reference range applies only to samples taken after fasting for at least 8 hours.   BUN 47 (H) 8 - 23 mg/dL   Creatinine, Ser 3.08 0.44 - 1.00 mg/dL   Calcium 8.7 (L) 8.9 - 10.3 mg/dL   Total Protein 5.9 (L) 6.5 - 8.1 g/dL   Albumin 2.6 (L) 3.5 - 5.0 g/dL   AST 45 (H) 15 - 41 U/L   ALT 40 0 - 44 U/L   Alkaline Phosphatase 56 38 - 126 U/L   Total Bilirubin 0.4 <1.2 mg/dL   GFR, Estimated 54 (L) >60 mL/min    Comment: (NOTE) Calculated using the CKD-EPI Creatinine Equation (2021)    Anion gap NOT CALCULATED 5 - 15    Comment: Performed at [facility], [address], [place], [place] [address]    No results found.  Past Medical History:  Diagnosis Date   Actinic keratosis 01/28/2020   R thumb proximal nail fold    Arthritis    Asthma    Atherosclerosis of abdominal aorta (HCC)    Atypical mole 05/13/2018   R distal lat thigh   Atypical mole 09/14/2014   left ant mid thigh   Atypical mole 01/19/2014   left paraspinal mid to upper back   Atypical mole 01/19/2014   right back mid to inf scapula   Atypical mole 01/19/2014   left posterior deltoid medial   Basal cell carcinoma  01/28/2020   left of midline mid dorsum nose   Basal cell carcinoma 01/06/2018   left preauricular    Basal cell carcinoma 11/22/2016   left upper lip   Basal cell carcinoma 03/17/2015   left lateral elbow   Basal cell carcinoma 07/22/2014   R medial inferior knee   Basal cell carcinoma 07/21/2020   Left dorsum hand. Nodular pattern. Excised 08/09/2020, margins free.   Basal cell carcinoma 02/23/2021   right dorsal hand (BCC/SCC), left dorsal hand, right chin - treated with EDC   Breast cancer (HCC)    Left breast lumpectomy with radiation  Bronchiectasis (HCC)    CHF (congestive heart failure) (HCC)    Chronic anticoagulation    Coronary artery disease    Dyspnea on exertion    Family history of adverse reaction to anesthesia    son naseau   Family history of breast cancer    H/O partial mastectomy    LEFT   History of breast cancer    History of coronary artery stent placement 2021 09/04/2023   History of pneumonia    History of shingles    History of thyroidectomy, total    Hyperlipidemia    Hypertension    Hypomagnesemia    Hypothyroidism    Macular degeneration    Myocardial infarction [facility])    NSTEMI (non-ST elevated myocardial infarction) (HCC) 02/13/2022   Pneumonia    Pulmonary hypertension (HCC)    Rectal prolapse    Renal mass, left    Right ventricular enlargement    Squamous cell carcinoma of skin 01/06/2018   right dorsum hand at ring finger MCP   Squamous cell carcinoma of skin 07/08/2017   left distal dorsum forearm    SUI (stress urinary incontinence, female)    Urge incontinence     Past Surgical History:  Procedure Laterality Date   ABDOMINAL HYSTERECTOMY     ABDOMINAL HYSTERECTOMY     APPENDECTOMY     BREAST LUMPECTOMY     CATARACT EXTRACTION, BILATERAL     CHOLECYSTECTOMY     COLONOSCOPY WITH PROPOFOL N/A 09/03/2023   Procedure: COLONOSCOPY WITH PROPOFOL;  Surgeon: Regis Bill, MD;  Location: [facility] ENDOSCOPY;  Service: Endoscopy;   Laterality: N/A;   CORONARY STENT INTERVENTION N/A 07/26/2020   Procedure: CORONARY STENT INTERVENTION;  Surgeon: Marcina Millard, MD;  Location: [facility] INVASIVE CV LAB;  Service: Cardiovascular;  Laterality: N/A;   FLEXIBLE SIGMOIDOSCOPY N/A 09/04/2023   Procedure: FLEXIBLE SIGMOIDOSCOPY;  Surgeon: Karie Soda, MD;  Location: [facility] ORS;  Service: General;  Laterality: N/A;   LEFT HEART CATH AND CORONARY ANGIOGRAPHY Left 07/26/2020   Procedure: LEFT HEART CATH AND CORONARY ANGIOGRAPHY;  Surgeon: Lamar Blinks, MD;  Location: [facility] INVASIVE CV LAB;  Service: Cardiovascular;  Laterality: Left;   REPLACEMENT TOTAL KNEE Left    TONSILLECTOMY     TONSILLECTOMY AND ADENOIDECTOMY      Social History   Socioeconomic History   Marital status: Widowed    Spouse name: Not on file   Number of children: Not on file   Years of education: Not on file   Highest education level: Not on file  Occupational History   Not on file  Tobacco Use   Smoking status: Former    Current packs/day: 0.00    Average packs/day: 1.5 packs/day for 6.0 years (9.0 ttl pk-yrs)    Types: Cigarettes    Start date: 19    Quit date: 44    Years since quitting: 49.9    Passive exposure: Never   Smokeless tobacco: Never  Vaping Use   Vaping status: Never Used  Substance and Sexual Activity   Alcohol use: Yes    Comment: very rare   Drug use: Never   Sexual activity: Not Currently  Other Topics Concern   Not on file  Social History Narrative   Married    Social Determinants of Health   Financial Resource Strain: Low Risk  (10/22/2022)   Overall Financial Resource Strain (CARDIA)    Difficulty of Paying Living Expenses: Not hard at all  Food Insecurity: No Food Insecurity (09/04/2023)  Hunger Vital Sign    Worried About Running Out of Food in the Last Year: Never true    Ran Out of Food in the Last Year: Never true  Transportation Needs: No Transportation Needs (09/04/2023)   PRAPARE -  Administrator, Civil Service (Medical): No    Lack of Transportation (Non-Medical): No  Physical Activity: Insufficiently Active (10/22/2022)   Exercise Vital Sign    Days of Exercise per Week: 2 days    Minutes of Exercise per Session: 40 min  Stress: No Stress Concern Present (10/22/2022)   [employer] of Occupational Health - Occupational Stress Questionnaire    Feeling of Stress : Not at all  Social Connections: Moderately Integrated (10/22/2022)   Social Connection and Isolation Panel [NHANES]    Frequency of Communication with Friends and Family: More than three times a week    Frequency of Social Gatherings with Friends and Family: More than three times a week    Attends Religious Services: More than 4 times per year    Active Member of [employer] or Organizations: Yes    Attends Banker Meetings: More than 4 times per year    Marital Status: Widowed  Intimate Partner Violence: Not At Risk (09/04/2023)   Humiliation, Afraid, Rape, and Kick questionnaire    Fear of Current or Ex-Partner: No    Emotionally Abused: No    Physically Abused: No    Sexually Abused: No    Family History  Problem Relation Age of Onset   CAD Mother    Cancer Maternal Aunt        unk type   Leukemia Maternal Grandmother    Breast cancer Half-Sister        dx 61s    Current Facility-Administered Medications  Medication Dose Route Frequency Provider Last Rate Last Admin   0.9 %  sodium chloride infusion  250 mL Intravenous PRN Karie Soda, MD       acetaminophen (TYLENOL) tablet 500 mg  500 mg Oral Q6H PRN Karie Soda, MD       albuterol (PROVENTIL) (2.5 MG/3ML) 0.083% nebulizer solution 2.5 mg  2.5 mg Nebulization BID Karie Soda, MD   2.5 mg at 09/16/23 0814   amLODipine (NORVASC) tablet 2.5 mg  2.5 mg Oral BID Karie Soda, MD       aspirin EC tablet 81 mg  81 mg Oral Daily Karie Soda, MD   81 mg at 09/16/23 1011   atorvastatin (LIPITOR) tablet 40 mg  40 mg  Oral Daily Karie Soda, MD       calcium-vitamin D (OSCAL WITH D) 500-5 MG-MCG per tablet 1 tablet  1 tablet Oral BID Karie Soda, MD       [START ON 09/17/2023] carvedilol (COREG) tablet 6.25 mg  6.25 mg Oral BID WC Karie Soda, MD       clopidogrel (PLAVIX) tablet 75 mg  75 mg Oral Daily Karie Soda, MD   75 mg at 09/16/23 1011   enoxaparin (LOVENOX) injection 30 mg  30 mg Subcutaneous Q24H Karie Soda, MD   30 mg at 09/16/23 0831   feeding supplement (ENSURE ENLIVE / ENSURE PLUS) liquid 237 mL  237 mL Oral TID BM Rolly Salter, MD   237 mL at 09/15/23 2100   fentaNYL (SUBLIMAZE) injection 12.5-25 mcg  12.5-25 mcg Intravenous Q4H PRN Karie Soda, MD       [START ON 09/17/2023] fluticasone furoate-vilanterol (BREO ELLIPTA) 100-25 MCG/ACT 1 puff  1  puff Inhalation Daily Karie Soda, MD       And   [START ON 09/17/2023] umeclidinium bromide (INCRUSE ELLIPTA) 62.5 MCG/ACT 1 puff  1 puff Inhalation Daily Karie Soda, MD       food thickener (SIMPLYTHICK (NECTAR/LEVEL 2/MILDLY THICK)) 1 packet  1 packet Oral PRN Rolly Salter, MD       furosemide (LASIX) tablet 20 mg  20 mg Oral Daily Karie Soda, MD       hydrALAZINE (APRESOLINE) injection 10 mg  10 mg Intravenous Q2H PRN Karie Soda, MD   10 mg at 09/09/23 1840   hydrocerin (EUCERIN) cream   Topical BID Karie Soda, MD   1 Application at 09/15/23 2102   HYDROcodone-acetaminophen (NORCO/VICODIN) 5-325 MG per tablet 1 tablet  1 tablet Oral Q4H PRN Karie Soda, MD       irbesartan (AVAPRO) tablet 150 mg  150 mg Oral Daily Karie Soda, MD       levothyroxine (SYNTHROID) tablet 150 mcg  150 mcg Oral Daily Karie Soda, MD   150 mcg at 09/16/23 0522   loperamide (IMODIUM) capsule 2 mg  2 mg Oral Laurena Slimmer, MD       magic mouthwash  15 mL Oral QID PRN Karie Soda, MD       menthol-cetylpyridinium (CEPACOL) lozenge 3 mg  1 lozenge Oral PRN Karie Soda, MD       metoCLOPramide (REGLAN) tablet 5 mg  5 mg Oral Q  breakfast Karie Soda, MD   5 mg at 09/16/23 0831   metoCLOPramide (REGLAN) tablet 5 mg  5 mg Oral Q supper Karie Soda, MD   5 mg at 09/16/23 1700   metoprolol tartrate (LOPRESSOR) injection 5 mg  5 mg Intravenous Q6H PRN Karie Soda, MD       naphazoline-glycerin (CLEAR EYES REDNESS) ophth solution 1-2 drop  1-2 drop Both Eyes QID PRN Karie Soda, MD       ondansetron [facility]) tablet 4 mg  4 mg Oral Q6H PRN Karie Soda, MD       Or   ondansetron [facility]) injection 4 mg  4 mg Intravenous Q6H PRN Karie Soda, MD   4 mg at 09/07/23 1104   phenol (CHLORASEPTIC) mouth spray 2 spray  2 spray Mouth/Throat PRN Karie Soda, MD       polycarbophil (FIBERCON) tablet 625 mg  625 mg Oral BID Karie Soda, MD   625 mg at 09/16/23 1010   prochlorperazine (COMPAZINE) tablet 10 mg  10 mg Oral Q6H PRN Karie Soda, MD       Or   prochlorperazine (COMPAZINE) injection 5-10 mg  5-10 mg Intravenous Q6H PRN Karie Soda, MD       simethicone (MYLICON) 40 MG/0.6ML suspension 40 mg  40 mg Oral QID Rolly Salter, MD   40 mg at 09/16/23 1800   sodium chloride (OCEAN) 0.65 % nasal spray 1-2 spray  1-2 spray Each Nare Q6H PRN Karie Soda, MD       sodium chloride flush (NS) 0.9 % injection 3 mL  3 mL Intravenous Catha Gosselin, MD   3 mL at 09/16/23 1026   sodium chloride flush (NS) 0.9 % injection 3 mL  3 mL Intravenous PRN Karie Soda, MD         Allergies  Allergen Reactions   Ticagrelor Shortness Of Breath    Extreme    Oxycodone Nausea Only    Signed:   Ardeth Sportsman, MD, FACS, MASCRS Esophageal,  Gastrointestinal & Colorectal Surgery Robotic and Minimally Invasive Surgery  [facility] [address], [address] [place], [place] [address] [phone] Fax [phone] Main  CONTACT INFORMATION: Weekday (9AM-5PM): Call [facility] main office at [phone] Weeknight (5PM-9AM) or Weekend/Holiday: Check EPIC "Web Links" tab &  use "AMION" (password " [email or web address]") for General Surgery [facility] coverage  Please, DO NOT use SecureChat  (it is not reliable communication to reach operating surgeons & will lead to a delay in care).   Epic staff messaging available for outptient concerns needing 1-2 business day response.      09/16/2023, 6:02 PM

## 2023-09-16 NOTE — Progress Notes (Signed)
Speech Language Pathology Treatment: Dysphagia  Patient Details Name: Varina Frasch MRN: [id number] DOB: Sep 29, 1930 Today's Date: 09/16/2023 Time: 1020-1030 SLP Time Calculation (min) (ACUTE ONLY): 10 min  Assessment / Plan / Recommendation Clinical Impression  Patient seen by SLP for skilled treatment focused on dysphagia goals. When SLP entered room, she was taking medications (whole pills taken with water). She denied any swallowing difficulties, denied any recent nausea or vomiting, denied globus sensation or regurgitation. She does endorse difficulty with pills but that this is not new.  Patient swallowed her pills one at a time with sips of water via straw. Subtle throat clearing observed and at times patient had to swallow an extra time. No change in voice and throat clearing was brief in duration. No coughing observed and patient did not report any globus sensation with pills. She told SLP that she had some oatmeal for breakfast and ordered a sandwich for lunch. Patient is likely at her baseline at this time regarding swallow function. No further skilled SLP intervention needed, but if patient has any c/o dysphagia, please reorder SLP.    HPI HPI: Patient is a 89 y.o. female who presented on 11/27 for schedueled rectal prolapse surgery. PMH is significant for CHF, CAD, pneumonia, thyroidectomy, hypertension, breast cancer s/p partial mastectomy L side, and mascualr degeneration. ST swallow eval ordered 12/01 per increased patient confusion and refusal of POs; advanced to clear liquids but esophageal dysphagia symptoms reported.  Subswallows indicated during tx session.  ST f/u for diet tolerance as diet was advanced to Regular/thin via MD.      SLP Plan  Discharge SLP treatment due to (comment);All goals met      Recommendations for follow up therapy are one component of a multi-disciplinary discharge planning process, led by the attending physician.  Recommendations may be updated based on  patient status, additional functional criteria and insurance authorization.    Recommendations  Diet recommendations: Regular;Thin liquid Liquids provided via: Cup;Straw Medication Administration: Other (Comment) (meds as tolerated) Supervision: Patient able to self feed Compensations: Slow rate;Small sips/bites Postural Changes and/or Swallow Maneuvers: Seated upright 90 degrees;Upright 30-60 min after meal                  Oral care BID   PRN Dysphagia, oropharyngeal phase (R13.12)     Discharge SLP treatment due to (comment);All goals met     Angela Nevin, MA, CCC-SLP Speech Therapy

## 2023-09-16 NOTE — Progress Notes (Signed)
Patient was unable to be discharged today due to her BP being high 186/77. After multiple times of rechecking it, the BP remained high. Dr. Michaell Cowing was called about the patient's BP. He stated to have patient's home medications started and will check patient in morning.

## 2023-09-16 NOTE — TOC Progression Note (Signed)
Transition of Care [facility]) - Progression Note    Patient Details  Name: Alexa Pham MRN: [id number] Date of Birth: 03-08-1930  [facility]) CM/SW Contact  Geni Bers, RN Phone Number: 09/16/2023, 1:19 PM  Clinical Narrative:     Pt's son Alexa Pham is okay with  Rehab in [place]. [facility] was called, RN is aware. Call report to [phone], room 1B. Also please, call pt's son Alexa Pham when [facility] leaves transport.   Expected Discharge Plan: Skilled Nursing Facility Barriers to Discharge: Continued Medical Work up, SNF Pending bed offer  Expected Discharge Plan and Services In-house Referral: Clinical Social Work Discharge Planning Services: NA Post Acute Care Choice: Skilled Nursing Facility Living arrangements for the past 2 months: Single Family Home Expected Discharge Date: 09/16/23               DME Arranged: N/A DME Agency: NA                   Social Determinants of Health (SDOH) Interventions SDOH Screenings   Food Insecurity: No Food Insecurity (09/04/2023)  Housing: Low Risk  (09/04/2023)  Transportation Needs: No Transportation Needs (09/04/2023)  Utilities: Not At Risk (09/04/2023)  Depression (PHQ2-9): Low Risk  (04/04/2023)  Financial Resource Strain: Low Risk  (10/22/2022)  Physical Activity: Insufficiently Active (10/22/2022)  Social Connections: Moderately Integrated (10/22/2022)  Stress: No Stress Concern Present (10/22/2022)  Tobacco Use: Medium Risk (09/04/2023)    Readmission Risk Interventions    09/06/2023    1:20 PM  Readmission Risk Prevention Plan  Transportation Screening Complete  PCP or Specialist Appt within 5-7 Days Complete  Home Care Screening Complete  Medication Review (RN CM) Complete

## 2023-09-16 NOTE — Plan of Care (Signed)
  Problem: Nutritional: Goal: Will attain and maintain optimal nutritional status will improve Outcome: Progressing

## 2023-09-16 NOTE — Progress Notes (Signed)
OT Cancellation Note  Patient Details Name: Shayvon Vanblaricum MRN: [id number] DOB: 1930/03/07   Cancelled Treatment:    Reason Eval/Treat Not Completed: Patient declined, no reason specified Patient reported plan was to d/c to SNF today with need to sleep this AM. OT to continue to follow and check back as schedule will allow.  Rosalio Loud, MS Acute Rehabilitation Department Office# [phone]  09/16/2023, 8:59 AM

## 2023-09-17 ENCOUNTER — Encounter (HOSPITAL_COMMUNITY): Payer: Self-pay | Admitting: Surgery

## 2023-09-17 DIAGNOSIS — H9190 Unspecified hearing loss, unspecified ear: Secondary | ICD-10-CM

## 2023-09-17 DIAGNOSIS — I509 Heart failure, unspecified: Secondary | ICD-10-CM

## 2023-09-17 MED ORDER — FUROSEMIDE 40 MG PO TABS
40.0000 mg | ORAL_TABLET | Freq: Once | ORAL | Status: AC
  Administered 2023-09-17: 40 mg via ORAL
  Filled 2023-09-17: qty 1

## 2023-09-17 MED ORDER — FUROSEMIDE 20 MG PO TABS: 20.0000 mg | ORAL_TABLET | Freq: Every day | ORAL | Status: DC

## 2023-09-17 MED ORDER — AMLODIPINE BESYLATE 5 MG PO TABS
5.0000 mg | ORAL_TABLET | Freq: Two times a day (BID) | ORAL | Status: DC
  Administered 2023-09-17: 5 mg via ORAL
  Filled 2023-09-17: qty 1

## 2023-09-17 MED ORDER — HYDROCODONE-ACETAMINOPHEN 5-325 MG PO TABS: 1.0000 | ORAL_TABLET | Freq: Four times a day (QID) | ORAL | 0 refills | Status: DC | PRN

## 2023-09-17 MED ORDER — CARVEDILOL 3.125 MG PO TABS
3.1250 mg | ORAL_TABLET | Freq: Two times a day (BID) | ORAL | 11 refills | Status: DC | PRN
Start: 2023-09-17 — End: 2023-11-04

## 2023-09-17 NOTE — TOC Transition Note (Signed)
Transition of Care [facility]) - CM/SW Discharge Note   Patient Details  Name: Alexa Pham MRN: [id number] Date of Birth: [birth year]  Transition of Care [facility]) CM/SW Contact:  Darleene Cleaver, LCSW Phone Number: 09/17/2023, 12:01 PM   Clinical Narrative:     CSW was informed that patient can discharge to SNF today.  CSW spoke to [place] at [facility], they can accept patient today.  Patient to be d/c'ed today to [facility] room 1B. Patient and family agreeable to plans will transport via ems RN to call report to [phone].  CSW notified patient's son Alexa Pham that she is discharging today.    Final next level of care: Skilled Nursing Facility Barriers to Discharge: Barriers Resolved   Patient Goals and CMS Choice CMS Medicare.gov Compare Post Acute Care list provided to:: Patient Represenative (must comment) Choice offered to / list presented to : Adult Children  Discharge Placement   SNF for short term rehab.   Existing PASRR number confirmed : 09/06/23          Patient chooses bed at: [facility] Patient to be transferred to facility by: [facility] Name of family member notified: Patient's son Alexa Pham Patient and family notified of of transfer: 09/17/23  Discharge Plan and Services Additional resources added to the After Visit Summary for   In-house Referral: Clinical Social Work Discharge Planning Services: NA Post Acute Care Choice: Skilled Nursing Facility          DME Arranged: N/A DME Agency: NA                  Social Determinants of Health (SDOH) Interventions SDOH Screenings   Food Insecurity: No Food Insecurity (09/04/2023)  Housing: Low Risk  (09/04/2023)  Transportation Needs: No Transportation Needs (09/04/2023)  Utilities: Not At Risk (09/04/2023)  Depression (PHQ2-9): Low Risk  (04/04/2023)  Financial Resource Strain: Low Risk  (10/22/2022)  Physical Activity: Insufficiently Active (10/22/2022)  Social Connections: Moderately  Integrated (10/22/2022)  Stress: No Stress Concern Present (10/22/2022)  Tobacco Use: Medium Risk (09/04/2023)     Readmission Risk Interventions    09/06/2023    1:20 PM  Readmission Risk Prevention Plan  Transportation Screening Complete  PCP or Specialist Appt within 5-7 Days Complete  Home Care Screening Complete  Medication Review (RN CM) Complete

## 2023-09-17 NOTE — Progress Notes (Addendum)
09/17/2023  Alexa Pham [id number] [birth year]  CARE TEAM: PCP: Dale Peever, MD  Outpatient Care Team: Patient Care Team: Dale [place], MD as PCP - General (Internal Medicine) Salena Saner, MD as Consulting Physician (Pulmonary Disease) Elige Radon, MD as Referring Physician (Cardiology) Regis Bill, MD as Consulting Physician (Gastroenterology) Karie Soda, MD as Consulting Physician (General Surgery) Lamar Blinks, MD as Referring Physician (Cardiology) Vanna Scotland, MD as Consulting Physician (Urology)  Inpatient Treatment Team: Treatment Team:  Karie Soda, MD Hughie Closs, MD Selinda Flavin, OT Kusnitz, Eli Phillips, RN Caudell, Allayne Butcher, RN Pricilla Riffle, RPH Hendricks Limes, PT Chalmers Guest, RN   Problem List:   Principal Problem:   Complete rectal prolapse Active Problems:   Hypertension   Hypothyroidism   Hypercholesterolemia   Bronchiectasis without complication [facility])   Aortic atherosclerosis (HCC)   CAD (coronary artery disease)   Urge incontinence   Rectal prolapse   Pulmonary hypertension (HCC)   Nocturnal hypoxia   Right ventricular enlargement   History of non-ST elevation myocardial infarction (NSTEMI)   Chronic anticoagulation   SUI (stress urinary incontinence, female)   Asthma   History of coronary artery stent placement 2021   AKI (acute kidney injury) (HCC)   Hypotension   Macrocytic anemia   Hypocalcemia   Moderate protein malnutrition (HCC)   Delirium   Hypophosphatemia   Protein-calorie malnutrition, severe   CKD (chronic kidney disease) stage 2, GFR 60-89 ml/min   CHF (congestive heart failure) (HCC)   HOH (hard of hearing)   09/04/2023  POST-OPERATIVE DIAGNOSIS:  RECTAL PROLAPSE   PROCEDURE:   ROBOTIC LOW ANTERIOR RECTOSIGMOID RESECTION WITH ANASTOMOSIS ROBOTIC RECTOPEXY FLEXIBLE SIGMOIDOSCOPY TRANSVERSUS ABDOMINIS PLANE (TAP) BLOCK - BILATERAL   Surgeons:  Karie Soda,  MD  OR FINDINGS:  Very redundant sigmoid colon resting in the low pelvis with some adhesions at the left adnexa.  1/2 feet of redundant rectosigmoid colon excised.   Patient has a descending colon to proximal rectal 31 EEA stapled anastomosis that rests 12-13 cm from the anal verge  Assessment [facility] Stay = 13 days) 13 Days Post-Op    Improved  Plan:  Patient MS back to normal which is reassuring.  Workup negative for any obvious stroke.  Agree with internal medicine this is most likely sundowning with some delirium that seems to gradually improving.  Solid diet.  Calorie counts.  She prefers to have oatmeal and fruit and not really eat any protein.  Getting picky about diet choices again, and encouraging sign she is getting back to her baseline.  She is at least willing to do oral supplemental shakes.  Hopefully she can have enough oral nutrition to continue to heal   Mobilizing get out of bed.  Plan to work with nursing team and therapies to see.  Anticoagulation regimen.  She is not actively bleeding back on home Plavix.  Follow electrolytes.  Hypophosphatemia.  Improved with IV replacement.  Follow-up.  Foley removed 12/2. UA neg  Try to keep on the dry side.  Internal medicine agreed with Lasix.  Make that more regular to hopefully keep her off oxygen.  Get her back on her usual inhalers.  They do not have her typical one on formulary so adjusted to different type her pharmacy  Concern for oral flush.  Fluconazole 7-day regimen given.    History of significant hypertension on numerous medications.  Back on Coreg.  Defer to medicine on resuming amlodipine and angiotensin him  better.  They signed off this weekend.  Will try and get her back on her usual carvedilol, Amlodipine, irbesartan, furosemide  VTE prophylaxis- SCDs, etc. back on Plavix anticoagulation.  Bowel regimen.  Fiber twice daily.  With looser bowel movements add Imodium at bedtime and as needed  She is  worried about having fecal incontinence again.  She does not feel like her sensation is back to normal nor incontinence.  Stools are thickened.  Will add Imodium to slow things down.  Noted return of sphincteric function incontinence can take some time, especially in a 89 year old who has been deconditioned.  I did caution that she will not have perfect continence necessarily in a 89 year old but should improve.  Discussed using Kegel exercises again.  See if physical therapy has some insights as well while she is in house.  If she is not better in 3 months, may consider outpatient pelvic floor physical therapy.  Hold off for now given recent pelvic surgery  Improving but still deconditioned.  I do think she would benefit from short-term SNF for more aggressive rehab to improve.  No beds available today = most likely transition to skilled nurse facility with PT/OT later today. -Disposition:  Disposition:  The patient is from: Home Anticipate discharge to:  Skilled Nursing Facility (SNF) Anticipated Date of Discharge is:  December 9,2024   Barriers to discharge:  Consultant clearance & sign off  , Transitions of Care, Therapy assessment & Recommendations pending, Need for inpatient procedure/study, and Pending Clinical improvement (more likely than not)  Patient currently is NOT MEDICALLY STABLE for discharge from the hospital from a surgery standpoint.      I reviewed nursing notes, hospitalist notes, last 24 h vitals and pain scores, last 48 h intake and output, last 24 h labs and trends, and last 24 h imaging results.  I have reviewed this patient's available data, including medical history, events of note, test results, etc as part of my evaluation.   A significant portion of that time was spent in counseling. Care during the described time interval was provided by me.  This care required high  level of medical decision making.  09/17/2023    Subjective: (Chief complaint)  Patient  feeling better.  Getting an appetite back.  Does not like the hospital food which is back to her usual baseline.  Still feels tired but trying to get out of bed.  Medicine signed off.   On 2 L oxygen.  Feels like she is breathing better  Denies abdominal pain  Objective:  Vital signs:  Vitals:   09/16/23 2152 09/17/23 0500 09/17/23 0620 09/17/23 0747  BP: (!) 148/75  (!) 168/86   Pulse: 69  79   Resp: 18  20   Temp: 98.9 F (37.2 C)  98.5 F (36.9 C)   TempSrc: Oral  Oral   SpO2: 96%  96% 98%  Weight:  51.1 kg    Height:        Last BM Date : 09/15/23  Intake/Output   Yesterday:  12/09 0701 - 12/10 0700 In: 660 [P.O.:660] Out: 650 [Urine:650] This shift:  Total I/O In: 120 [P.O.:120] Out: -   Bowel function:  Flatus: YES  BM:  YES  Drain: (No drain)   Physical Exam:  General: Pt awake /alert in no acute distress.  Very close to baseline.  Not toxic nor sickly.  Alert and chatty.  Initiating conversation. Eyes: PERRL, normal EOM.  Sclera clear.  No icterus Neuro:  CN II-XII intact w/o focal sensory/motor deficits. Lymph: No head/neck/groin lymphadenopathy Psych: No delirium or confusion today.  Not anxious..  Oriented x 4.   HENT: Normocephalic, Mucus membranes moist.  No thrush Neck: Supple, No tracheal deviation.  No obvious thyromegaly Chest: No pain to chest wall compression.  Good respiratory excursion.  No audible wheezing CV:  Pulses intact.  Regular rhythm.  No major extremity edema MS: Normal AROM mjr joints.  No obvious deformity  Abdomen: Soft.  Nondistended.  Nontender.  Incisions well-healed without any cellulitis or drainage.  No evidence of peritonitis.  No incarcerated hernias.  GU urine wick in place.  No vaginal bleeding or discharge. Rectal: Some thick stool in perineum and anus but no recurrent rectal prolapse.  Sphincter tone decreased but intact.  Ext:   No deformity.  No mjr edema.  No cyanosis Skin: No petechiae / purpurea.   No major sores.  Warm and dry    Results:   Cultures: No results found for this or any previous visit (from the past 720 hour(s)).  Labs: Results for orders placed or performed during the hospital encounter of 09/04/23 (from the past 48 hour(s))  Prealbumin     Status: Abnormal   Collection Time: 09/16/23 12:22 AM  Result Value Ref Range   Prealbumin 17 (L) 18 - 38 mg/dL    Comment: Performed at [facility], [address] [place], [place] [address]  Comprehensive metabolic panel     Status: Abnormal   Collection Time: 09/16/23 12:22 AM  Result Value Ref Range   Sodium 145 135 - 145 mmol/L   Potassium 3.7 3.5 - 5.1 mmol/L   Chloride 92 (L) 98 - 111 mmol/L   CO2 >45 (H) 22 - 32 mmol/L   Glucose, Bld 114 (H) 70 - 99 mg/dL    Comment: Glucose reference range applies only to samples taken after fasting for at least 8 hours.   BUN 47 (H) 8 - 23 mg/dL   Creatinine, Ser 8.41 0.44 - 1.00 mg/dL   Calcium 8.7 (L) 8.9 - 10.3 mg/dL   Total Protein 5.9 (L) 6.5 - 8.1 g/dL   Albumin 2.6 (L) 3.5 - 5.0 g/dL   AST 45 (H) 15 - 41 U/L   ALT 40 0 - 44 U/L   Alkaline Phosphatase 56 38 - 126 U/L   Total Bilirubin 0.4 <1.2 mg/dL   GFR, Estimated 54 (L) >60 mL/min    Comment: (NOTE) Calculated using the CKD-EPI Creatinine Equation (2021)    Anion gap NOT CALCULATED 5 - 15    Comment: Performed at [facility], [address], [place], [place] [address]    Imaging / Studies: No results found.  Medications / Allergies: per chart  Antibiotics: Anti-infectives (From admission, onward)    Start     Dose/Rate Route Frequency Ordered Stop   09/09/23 1000  fluconazole (DIFLUCAN) tablet 100 mg       Placed in "Followed by" Linked Group   100 mg Oral Daily 09/08/23 0749 09/14/23 0757   09/08/23 0845  fluconazole (DIFLUCAN) tablet 200 mg       Placed in "Followed by" Linked Group   200 mg Oral  Once 09/08/23 0749 09/08/23 0851   09/04/23 2000  cefoTEtan (CEFOTAN) 2 g  in sodium chloride 0.9 % 100 mL IVPB        2 g 200 mL/hr over 30 Minutes Intravenous Every 12 hours 09/04/23 1413 09/04/23 2119   09/04/23  0700  cefoTEtan (CEFOTAN) 2 g in sodium chloride 0.9 % 100 mL IVPB        2 g 200 mL/hr over 30 Minutes Intravenous On call to O.R. 09/04/23 1610 09/04/23 0851         Note: Portions of this report may have been transcribed using voice recognition software. Every effort was made to ensure accuracy; however, inadvertent computerized transcription errors may be present.   Any transcriptional errors that result from this process are unintentional.    Ardeth Sportsman, MD, FACS, MASCRS Esophageal, Gastrointestinal & Colorectal Surgery Robotic and Minimally Invasive Surgery  [facility] [address], [address] [place], [place] [address] [phone] Fax [phone] Main  CONTACT INFORMATION: Weekday (9AM-5PM): Call [facility] main office at [phone] Weeknight (5PM-9AM) or Weekend/Holiday: Check EPIC "Web Links" tab & use "AMION" (password " [email or web address]") for General Surgery [facility] coverage  Please, DO NOT use SecureChat  (it is not reliable communication to reach operating surgeons & will lead to a delay in care).   Epic staff messaging available for outptient concerns needing 1-2 business day response.      09/17/2023  8:31 AM

## 2023-09-17 NOTE — Plan of Care (Signed)
  Problem: Education: Goal: Understanding of discharge needs will improve Outcome: Adequate for Discharge Goal: Verbalization of understanding of the causes of altered bowel function will improve Outcome: Adequate for Discharge   Problem: Activity: Goal: Ability to tolerate increased activity will improve Outcome: Adequate for Discharge   Problem: Bowel/Gastric: Goal: Gastrointestinal status for postoperative course will improve Outcome: Adequate for Discharge   Problem: Health Behavior/Discharge Planning: Goal: Identification of community resources to assist with postoperative recovery needs will improve Outcome: Adequate for Discharge   Problem: Nutritional: Goal: Will attain and maintain optimal nutritional status will improve Outcome: Adequate for Discharge   Problem: Clinical Measurements: Goal: Postoperative complications will be avoided or minimized Outcome: Adequate for Discharge   Problem: Respiratory: Goal: Respiratory status will improve Outcome: Adequate for Discharge   Problem: Skin Integrity: Goal: Will show signs of wound healing Outcome: Adequate for Discharge   Problem: Education: Goal: Knowledge of General Education information will improve Description: Including pain rating scale, medication(s)/side effects and non-pharmacologic comfort measures Outcome: Adequate for Discharge   Problem: Health Behavior/Discharge Planning: Goal: Ability to manage health-related needs will improve Outcome: Adequate for Discharge   Problem: Clinical Measurements: Goal: Ability to maintain clinical measurements within normal limits will improve Outcome: Adequate for Discharge Goal: Will remain free from infection Outcome: Adequate for Discharge Goal: Diagnostic test results will improve Outcome: Adequate for Discharge Goal: Respiratory complications will improve Outcome: Adequate for Discharge Goal: Cardiovascular complication will be avoided Outcome: Adequate for  Discharge   Problem: Activity: Goal: Risk for activity intolerance will decrease Outcome: Adequate for Discharge   Problem: Nutrition: Goal: Adequate nutrition will be maintained Outcome: Adequate for Discharge   Problem: Coping: Goal: Level of anxiety will decrease Outcome: Adequate for Discharge   Problem: Elimination: Goal: Will not experience complications related to bowel motility Outcome: Adequate for Discharge Goal: Will not experience complications related to urinary retention Outcome: Adequate for Discharge   Problem: Pain Management: Goal: General experience of comfort will improve Outcome: Adequate for Discharge   Problem: Safety: Goal: Ability to remain free from injury will improve Outcome: Adequate for Discharge   Problem: Skin Integrity: Goal: Risk for impaired skin integrity will decrease Outcome: Adequate for Discharge

## 2023-09-17 NOTE — Progress Notes (Signed)
Occupational Therapy Treatment Patient Details Name: Alexa Pham MRN: [id number] DOB: Oct 07, 1930 Today's Date: 09/17/2023   History of present illness Pt is a 89 y/o female presenting on 11/27 with rectal prolapse s/p surgery 11/27.  Medicine service was consulted for hypotension as well as AKI. PMH includes: HTN, breast cancer s/p  partial mastectomy L side, macular degeneration, CHF, MI.   OT comments  Patient was seen for therapy this AM. Patient was +1 to transfer from EOB to recliner in room with increased time with RW. Patient educated on keeping hair brushed to prevent tangles/matting on back of head with increased time in bed. Patient verbalized and demonstrated understanding. Patient will benefit from continued inpatient follow up therapy, <3 hours/day       If plan is discharge home, recommend the following:  A lot of help with walking and/or transfers;A lot of help with bathing/dressing/bathroom;Assist for transportation;Assistance with cooking/housework;Direct supervision/assist for medications management   Equipment Recommendations  BSC/3in1       Precautions / Restrictions Precautions Precautions: Fall Precaution Comments: monitor sats Restrictions Weight Bearing Restrictions: No              ADL either performed or assessed with clinical judgement   ADL Overall ADL's : Needs assistance/impaired     Grooming: Brushing hair;Set up;Sitting Grooming Details (indicate cue type and reason): in recliner with increased time and cues for knots in back of head.            Cognition Arousal: Alert Behavior During Therapy: WFL for tasks assessed/performed Overall Cognitive Status: Difficult to assess                                 General Comments: appropirate follows commands with increased time. needed encouragement to get out of bed.                   Pertinent Vitals/ Pain       Pain Assessment Pain Assessment: No/denies pain          Frequency  Min 1X/week        Progress Toward Goals  OT Goals(current goals can now be found in the care plan section)  Progress towards OT goals: Progressing toward goals     Plan         AM-PAC OT "6 Clicks" Daily Activity     Outcome Measure   Help from another person eating meals?: A Little Help from another person taking care of personal grooming?: A Little Help from another person toileting, which includes using toliet, bedpan, or urinal?: A Lot Help from another person bathing (including washing, rinsing, drying)?: A Lot Help from another person to put on and taking off regular upper body clothing?: A Little Help from another person to put on and taking off regular lower body clothing?: Total 6 Click Score: 14    End of Session Equipment Utilized During Treatment: Rolling walker (2 wheels);Gait belt  OT Visit Diagnosis: Unsteadiness on feet (R26.81);Other abnormalities of gait and mobility (R26.89);Muscle weakness (generalized) (M62.81);Dizziness and giddiness (R42)   Activity Tolerance Patient tolerated treatment well   Patient Left in chair;with call bell/phone within reach;with chair alarm set   Nurse Communication Other (comment) (in room during session)        Time: 2831-5176 OT Time Calculation (min): 14 min  Charges: OT General Charges $OT Visit: 1 Visit OT Treatments $Self Care/Home Management : 8-22 mins  Rosalio Loud, MS Acute Rehabilitation Department Office# [phone]   Selinda Flavin 09/17/2023, 12:27 PM

## 2023-09-17 NOTE — Progress Notes (Signed)
Patient discharged with written instructions, and understood instructions with no questions. Patient stable for discharge. Patient discharged via stretcher with EMS.

## 2023-09-18 ENCOUNTER — Ambulatory Visit (INDEPENDENT_AMBULATORY_CARE_PROVIDER_SITE_OTHER): Payer: Medicare Other

## 2023-10-22 ENCOUNTER — Ambulatory Visit (INDEPENDENT_AMBULATORY_CARE_PROVIDER_SITE_OTHER): Payer: Medicare Other | Admitting: Internal Medicine

## 2023-10-22 DIAGNOSIS — I10 Essential (primary) hypertension: Secondary | ICD-10-CM

## 2023-10-22 NOTE — Progress Notes (Deleted)
 Subjective:    [name], female    DOB: 1930-07-30, 89 y.o.   MRN: [id number]  Patient here for No chief complaint on file.   HPI Here for a scheduled follow up -    Past Medical History:  Diagnosis Date   Actinic keratosis 01/28/2020   R thumb proximal nail fold    Arthritis    Asthma    Atherosclerosis of abdominal aorta (HCC)    Atypical mole 05/13/2018   R distal lat thigh   Atypical mole 09/14/2014   left ant mid thigh   Atypical mole 01/19/2014   left paraspinal mid to upper back   Atypical mole 01/19/2014   right back mid to inf scapula   Atypical mole 01/19/2014   left posterior deltoid medial   Basal cell carcinoma 01/28/2020   left of midline mid dorsum nose   Basal cell carcinoma 01/06/2018   left preauricular    Basal cell carcinoma 11/22/2016   left upper lip   Basal cell carcinoma 03/17/2015   left lateral elbow   Basal cell carcinoma 07/22/2014   R medial inferior knee   Basal cell carcinoma 07/21/2020   Left dorsum hand. Nodular pattern. Excised 08/09/2020, margins free.   Basal cell carcinoma 02/23/2021   right dorsal hand (BCC/SCC), left dorsal hand, right chin - treated with EDC   Breast cancer (HCC)    Left breast lumpectomy with radiation   Bronchiectasis (HCC)    CHF (congestive heart failure) (HCC)    Chronic anticoagulation    Coronary artery disease    Dyspnea on exertion    Family history of adverse reaction to anesthesia    son naseau   Family history of breast cancer    H/O partial mastectomy    LEFT   History of breast cancer    History of coronary artery stent placement 2021 09/04/2023   History of pneumonia    History of shingles    History of thyroidectomy, total    Hyperlipidemia    Hypertension    Hypomagnesemia    Hypothyroidism    Macular degeneration    Myocardial infarction [facility])    NSTEMI (non-ST elevated myocardial infarction) (HCC) 02/13/2022   Pneumonia    Pulmonary hypertension (HCC)     Rectal prolapse    Renal mass, left    Right ventricular enlargement    Squamous cell carcinoma of skin 01/06/2018   right dorsum hand at ring finger MCP   Squamous cell carcinoma of skin 07/08/2017   left distal dorsum forearm    SUI (stress urinary incontinence, female)    Urge incontinence    Past Surgical History:  Procedure Laterality Date   ABDOMINAL HYSTERECTOMY     ABDOMINAL HYSTERECTOMY     APPENDECTOMY     BREAST LUMPECTOMY     CATARACT EXTRACTION, BILATERAL     CHOLECYSTECTOMY     COLONOSCOPY WITH PROPOFOL  N/A 09/03/2023   Procedure: COLONOSCOPY WITH PROPOFOL ;  Surgeon: Maryruth Ole DASEN, MD;  Location: [facility] ENDOSCOPY;  Service: Endoscopy;  Laterality: N/A;   CORONARY STENT INTERVENTION N/A 07/26/2020   Procedure: CORONARY STENT INTERVENTION;  Surgeon: Ammon Blunt, MD;  Location: [facility] INVASIVE CV LAB;  Service: Cardiovascular;  Laterality: N/A;   FLEXIBLE SIGMOIDOSCOPY N/A 09/04/2023   Procedure: FLEXIBLE SIGMOIDOSCOPY;  Surgeon: Sheldon Standing, MD;  Location: [facility] ORS;  Service: General;  Laterality: N/A;   LEFT HEART CATH AND CORONARY ANGIOGRAPHY Left 07/26/2020   Procedure: LEFT HEART CATH AND CORONARY  ANGIOGRAPHY;  Surgeon: Hester Wolm PARAS, MD;  Location: [facility] INVASIVE CV LAB;  Service: Cardiovascular;  Laterality: Left;   REPLACEMENT TOTAL KNEE Left    TONSILLECTOMY     TONSILLECTOMY AND ADENOIDECTOMY     Family History  Problem Relation Age of Onset   CAD Mother    Cancer Maternal Aunt        unk type   Leukemia Maternal Grandmother    Breast cancer Half-Sister        dx 80s   Social History   Socioeconomic History   Marital status: Widowed    Spouse name: Not on file   Number of children: Not on file   Years of education: Not on file   Highest education level: Not on file  Occupational History   Not on file  Tobacco Use   Smoking status: Former    Current packs/day: 0.00    Average packs/day: 1.5 packs/day for 6.0 years (9.0 ttl pk-yrs)     Types: Cigarettes    Start date: 9    Quit date: 49    Years since quitting: 50.0    Passive exposure: Never   Smokeless tobacco: Never  Vaping Use   Vaping status: Never Used  Substance and Sexual Activity   Alcohol  use: Yes    Comment: very rare   Drug use: Never   Sexual activity: Not Currently  Other Topics Concern   Not on file  Social History Narrative   Married    Social Drivers of Health   Financial Resource Strain: Low Risk  (10/22/2022)   Overall Financial Resource Strain (CARDIA)    Difficulty of Paying Living Expenses: Not hard at all  Food Insecurity: No Food Insecurity (09/04/2023)   Hunger Vital Sign    Worried About Running Out of Food in the Last Year: Never true    Ran Out of Food in the Last Year: Never true  Transportation Needs: No Transportation Needs (09/04/2023)   PRAPARE - Administrator, Civil Service (Medical): No    Lack of Transportation (Non-Medical): No  Physical Activity: Insufficiently Active (10/22/2022)   Exercise Vital Sign    Days of Exercise per Week: 2 days    Minutes of Exercise per Session: 40 min  Stress: No Stress Concern Present (10/22/2022)   [employer] of Occupational Health - Occupational Stress Questionnaire    Feeling of Stress : Not at all  Social Connections: Moderately Integrated (10/22/2022)   Social Connection and Isolation Panel [NHANES]    Frequency of Communication with Friends and Family: More than three times a week    Frequency of Social Gatherings with Friends and Family: More than three times a week    Attends Religious Services: More than 4 times per year    Active Member of [employer] or Organizations: Yes    Attends Banker Meetings: More than 4 times per year    Marital Status: Widowed     Review of Systems     Objective:     There were no vitals taken for this visit. Wt Readings from Last 3 Encounters:  09/17/23 112 lb 10.5 oz (51.1 kg)  09/03/23 110 lb 3.2 oz  (50 kg)  08/27/23 111 lb (50.3 kg)    Physical Exam  {Perform Simple Foot Exam  Perform Detailed exam:1} {Insert foot Exam (Optional):30965}   Outpatient Encounter Medications as of 10/22/2023  Medication Sig   albuterol  (PROVENTIL ) (2.5 MG/3ML) 0.083% nebulizer solution Take 2.5 mg  by nebulization 2 (two) times daily.   amLODipine  (NORVASC ) 2.5 MG tablet Take 2.5 mg by mouth 2 (two) times daily.   aspirin  EC 81 MG EC tablet Take 1 tablet (81 mg total) by mouth daily. Swallow whole.   atorvastatin  (LIPITOR) 40 MG tablet TAKE 1 TABLET(40 MG) BY MOUTH DAILY   calcium  citrate-vitamin D  (CITRACAL+D) 315-200 MG-UNIT tablet Take 1 tablet by mouth 2 (two) times daily.   carvedilol  (COREG ) 3.125 MG tablet Take 1 tablet (3.125 mg total) by mouth 2 (two) times daily as needed (SBP > 180).   carvedilol  (COREG ) 6.25 MG tablet Take 6.25 mg by mouth 2 (two) times daily with a meal.   clopidogrel  (PLAVIX ) 75 MG tablet Take 1 tablet (75 mg total) by mouth daily.   Fluticasone -Umeclidin-Vilant (TRELEGY ELLIPTA ) 100-62.5-25 MCG/ACT AEPB Inhale 1 puff into the lungs daily.   furosemide  (LASIX ) 20 MG tablet Take 1 tablet (20 mg total) by mouth daily as needed for edema (SBP > 180).   HYDROcodone -acetaminophen  (NORCO/VICODIN) 5-325 MG tablet Take 1 tablet by mouth every 6 (six) hours as needed for severe pain (pain score 7-10).   irbesartan  (AVAPRO ) 150 MG tablet Take 1 tablet (150 mg total) by mouth daily. Hold if SBP < 120   levothyroxine  (SYNTHROID ) 150 MCG tablet Take 1 tablet (150 mcg total) by mouth daily.   loperamide  (IMODIUM ) 2 MG capsule Take 1 capsule (2 mg total) by mouth at bedtime. Hold if no BM in 24 hours   metoCLOPramide  (REGLAN ) 5 MG tablet Take 1 tablet (5 mg total) by mouth daily with breakfast.   Multiple Vitamins-Minerals (PRESERVISION AREDS 2 PO) Take 1 tablet by mouth 2 (two) times daily.    polycarbophil (FIBERCON) 625 MG tablet Take 1 tablet (625 mg total) by mouth 2 (two) times  daily.   potassium chloride  (KLOR-CON ) 10 MEQ tablet TAKE 1 TABLET(10 MEQ) BY MOUTH DAILY (Patient taking differently: Take 10 mEq by mouth daily.)   sodium chloride  HYPERTONIC 3 % nebulizer solution Take 4 mLs by nebulization in the morning and at bedtime.   No facility-administered encounter medications on file as of 10/22/2023.     Lab Results  Component Value Date   WBC 6.9 09/12/2023   HGB 11.3 (L) 09/12/2023   HCT 35.7 (L) 09/12/2023   PLT 331 09/12/2023   GLUCOSE 114 (H) 09/16/2023   CHOL 126 07/24/2023   TRIG 51.0 07/24/2023   HDL 51.70 07/24/2023   LDLCALC 64 07/24/2023   ALT 40 09/16/2023   AST 45 (H) 09/16/2023   NA 145 09/16/2023   K 3.7 09/16/2023   CL 92 (L) 09/16/2023   CREATININE 0.98 09/16/2023   BUN 47 (H) 09/16/2023   CO2 >45 (H) 09/16/2023   TSH 0.804 09/08/2023   INR 1.1 02/13/2022   HGBA1C 5.8 (H) 08/23/2023    No results found.     Assessment & Plan:  There are no diagnoses linked to this encounter.   Allena Hamilton, MD

## 2023-10-24 ENCOUNTER — Encounter: Payer: Self-pay | Admitting: Internal Medicine

## 2023-10-25 NOTE — Telephone Encounter (Signed)
Confirmed no nausea no vomiting. Patient is going to come in 1/21 for appt.

## 2023-10-25 NOTE — Telephone Encounter (Signed)
Please call and see if any nausea or vomiting or any other symptoms. Sounds like she needs an appt to discuss.

## 2023-10-27 ENCOUNTER — Encounter: Payer: Self-pay | Admitting: Internal Medicine

## 2023-10-27 NOTE — Progress Notes (Signed)
[name], female   DOB: Feb 13, 1930, 89 y.o.   MRN: [id number] No showed for appt. Is in rehabl.

## 2023-10-28 ENCOUNTER — Encounter: Payer: Self-pay | Admitting: Urology

## 2023-10-28 ENCOUNTER — Telehealth (INDEPENDENT_AMBULATORY_CARE_PROVIDER_SITE_OTHER): Payer: Self-pay | Admitting: Otolaryngology

## 2023-10-28 NOTE — Telephone Encounter (Signed)
LVM to confirm appt & location [phone] afm

## 2023-10-29 ENCOUNTER — Encounter: Payer: Self-pay | Admitting: Internal Medicine

## 2023-10-29 ENCOUNTER — Inpatient Hospital Stay
Admission: EM | Admit: 2023-10-29 | Discharge: 2023-11-09 | DRG: 193 | Disposition: E | Payer: Medicare Other | Source: Ambulatory Visit | Attending: Internal Medicine | Admitting: Internal Medicine

## 2023-10-29 ENCOUNTER — Ambulatory Visit (INDEPENDENT_AMBULATORY_CARE_PROVIDER_SITE_OTHER): Payer: Medicare Other

## 2023-10-29 ENCOUNTER — Encounter (INDEPENDENT_AMBULATORY_CARE_PROVIDER_SITE_OTHER): Payer: Self-pay

## 2023-10-29 ENCOUNTER — Emergency Department: Payer: Medicare Other

## 2023-10-29 ENCOUNTER — Other Ambulatory Visit: Payer: Self-pay

## 2023-10-29 ENCOUNTER — Ambulatory Visit (INDEPENDENT_AMBULATORY_CARE_PROVIDER_SITE_OTHER): Payer: Medicare Other | Admitting: Audiology

## 2023-10-29 ENCOUNTER — Ambulatory Visit: Payer: Medicare Other | Admitting: Internal Medicine

## 2023-10-29 VITALS — BP 138/74 | HR 73 | Temp 98.5°F | Resp 16 | Ht 66.0 in | Wt 121.4 lb

## 2023-10-29 DIAGNOSIS — R0602 Shortness of breath: Secondary | ICD-10-CM

## 2023-10-29 DIAGNOSIS — N182 Chronic kidney disease, stage 2 (mild): Secondary | ICD-10-CM | POA: Diagnosis present

## 2023-10-29 DIAGNOSIS — E890 Postprocedural hypothyroidism: Secondary | ICD-10-CM | POA: Diagnosis present

## 2023-10-29 DIAGNOSIS — Z7902 Long term (current) use of antithrombotics/antiplatelets: Secondary | ICD-10-CM | POA: Diagnosis not present

## 2023-10-29 DIAGNOSIS — Z885 Allergy status to narcotic agent status: Secondary | ICD-10-CM

## 2023-10-29 DIAGNOSIS — Z955 Presence of coronary angioplasty implant and graft: Secondary | ICD-10-CM

## 2023-10-29 DIAGNOSIS — I252 Old myocardial infarction: Secondary | ICD-10-CM | POA: Diagnosis not present

## 2023-10-29 DIAGNOSIS — K623 Rectal prolapse: Secondary | ICD-10-CM | POA: Diagnosis not present

## 2023-10-29 DIAGNOSIS — Z79899 Other long term (current) drug therapy: Secondary | ICD-10-CM

## 2023-10-29 DIAGNOSIS — N3941 Urge incontinence: Secondary | ICD-10-CM | POA: Diagnosis present

## 2023-10-29 DIAGNOSIS — E039 Hypothyroidism, unspecified: Secondary | ICD-10-CM

## 2023-10-29 DIAGNOSIS — E785 Hyperlipidemia, unspecified: Secondary | ICD-10-CM | POA: Diagnosis present

## 2023-10-29 DIAGNOSIS — I2510 Atherosclerotic heart disease of native coronary artery without angina pectoris: Secondary | ICD-10-CM | POA: Diagnosis present

## 2023-10-29 DIAGNOSIS — I10 Essential (primary) hypertension: Secondary | ICD-10-CM | POA: Diagnosis present

## 2023-10-29 DIAGNOSIS — Z803 Family history of malignant neoplasm of breast: Secondary | ICD-10-CM

## 2023-10-29 DIAGNOSIS — Z66 Do not resuscitate: Secondary | ICD-10-CM | POA: Diagnosis not present

## 2023-10-29 DIAGNOSIS — R0902 Hypoxemia: Secondary | ICD-10-CM | POA: Diagnosis not present

## 2023-10-29 DIAGNOSIS — Z85828 Personal history of other malignant neoplasm of skin: Secondary | ICD-10-CM

## 2023-10-29 DIAGNOSIS — Z853 Personal history of malignant neoplasm of breast: Secondary | ICD-10-CM

## 2023-10-29 DIAGNOSIS — J9621 Acute and chronic respiratory failure with hypoxia: Secondary | ICD-10-CM | POA: Diagnosis present

## 2023-10-29 DIAGNOSIS — J918 Pleural effusion in other conditions classified elsewhere: Secondary | ICD-10-CM | POA: Diagnosis present

## 2023-10-29 DIAGNOSIS — Z515 Encounter for palliative care: Secondary | ICD-10-CM

## 2023-10-29 DIAGNOSIS — Z5320 Procedure and treatment not carried out because of patient's decision for unspecified reasons: Secondary | ICD-10-CM | POA: Diagnosis present

## 2023-10-29 DIAGNOSIS — Z8249 Family history of ischemic heart disease and other diseases of the circulatory system: Secondary | ICD-10-CM | POA: Diagnosis not present

## 2023-10-29 DIAGNOSIS — J189 Pneumonia, unspecified organism: Principal | ICD-10-CM | POA: Diagnosis present

## 2023-10-29 DIAGNOSIS — Z7901 Long term (current) use of anticoagulants: Secondary | ICD-10-CM | POA: Diagnosis not present

## 2023-10-29 DIAGNOSIS — Z90710 Acquired absence of both cervix and uterus: Secondary | ICD-10-CM

## 2023-10-29 DIAGNOSIS — Z87891 Personal history of nicotine dependence: Secondary | ICD-10-CM | POA: Diagnosis not present

## 2023-10-29 DIAGNOSIS — I272 Pulmonary hypertension, unspecified: Secondary | ICD-10-CM | POA: Diagnosis present

## 2023-10-29 DIAGNOSIS — Z7982 Long term (current) use of aspirin: Secondary | ICD-10-CM | POA: Diagnosis not present

## 2023-10-29 DIAGNOSIS — J90 Pleural effusion, not elsewhere classified: Secondary | ICD-10-CM | POA: Insufficient documentation

## 2023-10-29 DIAGNOSIS — Z1152 Encounter for screening for COVID-19: Secondary | ICD-10-CM | POA: Diagnosis not present

## 2023-10-29 DIAGNOSIS — E43 Unspecified severe protein-calorie malnutrition: Secondary | ICD-10-CM | POA: Diagnosis present

## 2023-10-29 DIAGNOSIS — Z8619 Personal history of other infectious and parasitic diseases: Secondary | ICD-10-CM

## 2023-10-29 DIAGNOSIS — N393 Stress incontinence (female) (male): Secondary | ICD-10-CM | POA: Diagnosis not present

## 2023-10-29 DIAGNOSIS — Z883 Allergy status to other anti-infective agents status: Secondary | ICD-10-CM

## 2023-10-29 DIAGNOSIS — I5033 Acute on chronic diastolic (congestive) heart failure: Secondary | ICD-10-CM | POA: Diagnosis present

## 2023-10-29 DIAGNOSIS — J479 Bronchiectasis, uncomplicated: Secondary | ICD-10-CM

## 2023-10-29 DIAGNOSIS — J9601 Acute respiratory failure with hypoxia: Secondary | ICD-10-CM

## 2023-10-29 DIAGNOSIS — Z806 Family history of leukemia: Secondary | ICD-10-CM

## 2023-10-29 DIAGNOSIS — Z681 Body mass index (BMI) 19 or less, adult: Secondary | ICD-10-CM | POA: Diagnosis not present

## 2023-10-29 DIAGNOSIS — Z9981 Dependence on supplemental oxygen: Secondary | ICD-10-CM

## 2023-10-29 DIAGNOSIS — Z8701 Personal history of pneumonia (recurrent): Secondary | ICD-10-CM

## 2023-10-29 DIAGNOSIS — J45909 Unspecified asthma, uncomplicated: Secondary | ICD-10-CM | POA: Diagnosis present

## 2023-10-29 DIAGNOSIS — R531 Weakness: Secondary | ICD-10-CM

## 2023-10-29 DIAGNOSIS — I130 Hypertensive heart and chronic kidney disease with heart failure and stage 1 through stage 4 chronic kidney disease, or unspecified chronic kidney disease: Secondary | ICD-10-CM | POA: Diagnosis present

## 2023-10-29 DIAGNOSIS — E7800 Pure hypercholesterolemia, unspecified: Secondary | ICD-10-CM

## 2023-10-29 DIAGNOSIS — Z79890 Hormone replacement therapy (postmenopausal): Secondary | ICD-10-CM

## 2023-10-29 DIAGNOSIS — N2889 Other specified disorders of kidney and ureter: Secondary | ICD-10-CM

## 2023-10-29 DIAGNOSIS — H9190 Unspecified hearing loss, unspecified ear: Secondary | ICD-10-CM | POA: Diagnosis present

## 2023-10-29 DIAGNOSIS — Z96652 Presence of left artificial knee joint: Secondary | ICD-10-CM | POA: Diagnosis present

## 2023-10-29 DIAGNOSIS — R54 Age-related physical debility: Secondary | ICD-10-CM | POA: Diagnosis present

## 2023-10-29 LAB — COMPREHENSIVE METABOLIC PANEL
ALT: 23 U/L (ref 0–44)
AST: 35 U/L (ref 15–41)
Albumin: 2.6 g/dL — ABNORMAL LOW (ref 3.5–5.0)
Alkaline Phosphatase: 63 U/L (ref 38–126)
Anion gap: 9 (ref 5–15)
BUN: 41 mg/dL — ABNORMAL HIGH (ref 8–23)
CO2: 36 mmol/L — ABNORMAL HIGH (ref 22–32)
Calcium: 8.5 mg/dL — ABNORMAL LOW (ref 8.9–10.3)
Chloride: 95 mmol/L — ABNORMAL LOW (ref 98–111)
Creatinine, Ser: 0.88 mg/dL (ref 0.44–1.00)
GFR, Estimated: >60 mL/min (ref >60)
Glucose, Bld: 145 mg/dL — ABNORMAL HIGH (ref 70–99)
Potassium: 4.6 mmol/L (ref 3.5–5.1)
Sodium: 140 mmol/L (ref 135–145)
Total Bilirubin: 0.8 mg/dL (ref 0.0–1.2)
Total Protein: 6.9 g/dL (ref 6.5–8.1)

## 2023-10-29 LAB — CBC
HCT: 35.7 % — ABNORMAL LOW (ref 36.0–46.0)
Hemoglobin: 10.6 g/dL — ABNORMAL LOW (ref 12.0–15.0)
MCH: 29.7 pg (ref 26.0–34.0)
MCHC: 29.7 g/dL — ABNORMAL LOW (ref 30.0–36.0)
MCV: 100 fL (ref 80.0–100.0)
Platelets: 313 10*3/uL (ref 150–400)
RBC: 3.57 MIL/uL — ABNORMAL LOW (ref 3.87–5.11)
RDW: 15.2 % (ref 11.5–15.5)
WBC: 9.2 10*3/uL (ref 4.0–10.5)
nRBC: 0 % (ref 0.0–0.2)

## 2023-10-29 LAB — RESP PANEL BY RT-PCR (RSV, FLU A&B, COVID)  RVPGX2
Influenza A by PCR: NEGATIVE
Influenza B by PCR: NEGATIVE
Resp Syncytial Virus by PCR: NEGATIVE
SARS Coronavirus 2 by RT PCR: NEGATIVE

## 2023-10-29 MED ORDER — AMLODIPINE BESYLATE 5 MG PO TABS
2.5000 mg | ORAL_TABLET | Freq: Two times a day (BID) | ORAL | Status: DC
  Administered 2023-10-29 – 2023-11-02 (×8): 2.5 mg via ORAL
  Filled 2023-10-29 (×8): qty 1

## 2023-10-29 MED ORDER — ACETAMINOPHEN 325 MG RE SUPP: 650.0000 mg | Freq: Four times a day (QID) | RECTAL | Status: DC | PRN

## 2023-10-29 MED ORDER — CARVEDILOL 6.25 MG PO TABS
6.2500 mg | ORAL_TABLET | Freq: Two times a day (BID) | ORAL | Status: DC
  Administered 2023-10-30 – 2023-11-02 (×7): 6.25 mg via ORAL
  Filled 2023-10-29 (×7): qty 1

## 2023-10-29 MED ORDER — HEPARIN SODIUM (PORCINE) 5000 UNIT/ML IJ SOLN
5000.0000 [IU] | Freq: Three times a day (TID) | INTRAMUSCULAR | Status: DC
  Administered 2023-10-29 – 2023-11-02 (×11): 5000 [IU] via SUBCUTANEOUS
  Filled 2023-10-29 (×12): qty 1

## 2023-10-29 MED ORDER — ACETAMINOPHEN 325 MG PO TABS
650.0000 mg | ORAL_TABLET | Freq: Four times a day (QID) | ORAL | Status: DC | PRN
  Administered 2023-10-29: 650 mg via ORAL
  Filled 2023-10-29: qty 2

## 2023-10-29 MED ORDER — SODIUM CHLORIDE 0.9 % IV SOLN
2.0000 g | INTRAVENOUS | Status: AC
  Administered 2023-10-30 – 2023-11-02 (×4): 2 g via INTRAVENOUS
  Filled 2023-10-29 (×4): qty 20

## 2023-10-29 MED ORDER — CALCIUM POLYCARBOPHIL 625 MG PO TABS
625.0000 mg | ORAL_TABLET | Freq: Two times a day (BID) | ORAL | Status: DC
  Administered 2023-10-30 – 2023-11-02 (×7): 625 mg via ORAL
  Filled 2023-10-29 (×9): qty 1

## 2023-10-29 MED ORDER — ALBUTEROL SULFATE (2.5 MG/3ML) 0.083% IN NEBU
2.5000 mg | INHALATION_SOLUTION | Freq: Two times a day (BID) | RESPIRATORY_TRACT | Status: DC
  Administered 2023-10-30 – 2023-10-31 (×3): 2.5 mg via RESPIRATORY_TRACT
  Filled 2023-10-29 (×4): qty 3

## 2023-10-29 MED ORDER — ALBUTEROL SULFATE HFA 108 (90 BASE) MCG/ACT IN AERS: 2.0000 | INHALATION_SPRAY | RESPIRATORY_TRACT | Status: DC | PRN

## 2023-10-29 MED ORDER — SENNOSIDES-DOCUSATE SODIUM 8.6-50 MG PO TABS
1.0000 | ORAL_TABLET | Freq: Every evening | ORAL | Status: DC | PRN
Start: 2023-10-29 — End: 2023-11-02

## 2023-10-29 MED ORDER — IPRATROPIUM-ALBUTEROL 0.5-2.5 (3) MG/3ML IN SOLN
3.0000 mL | Freq: Four times a day (QID) | RESPIRATORY_TRACT | Status: AC
  Administered 2023-10-29 – 2023-10-30 (×3): 3 mL via RESPIRATORY_TRACT
  Filled 2023-10-29 (×3): qty 3

## 2023-10-29 MED ORDER — ONDANSETRON HCL 4 MG/2ML IJ SOLN
4.0000 mg | Freq: Four times a day (QID) | INTRAMUSCULAR | Status: DC | PRN
Start: 2023-10-29 — End: 2023-11-02
  Administered 2023-11-02: 4 mg via INTRAVENOUS
  Filled 2023-10-29: qty 2

## 2023-10-29 MED ORDER — ONDANSETRON HCL 4 MG PO TABS: 4.0000 mg | ORAL_TABLET | Freq: Four times a day (QID) | ORAL | Status: DC | PRN

## 2023-10-29 MED ORDER — SODIUM CHLORIDE 0.9 % IV SOLN
500.0000 mg | Freq: Once | INTRAVENOUS | Status: AC
  Administered 2023-10-29: 500 mg via INTRAVENOUS
  Filled 2023-10-29: qty 5

## 2023-10-29 MED ORDER — SODIUM CHLORIDE 0.9 % IV SOLN
500.0000 mg | INTRAVENOUS | Status: AC
  Administered 2023-10-30 – 2023-11-02 (×4): 500 mg via INTRAVENOUS
  Filled 2023-10-29 (×4): qty 5

## 2023-10-29 MED ORDER — LEVOTHYROXINE SODIUM 50 MCG PO TABS
150.0000 ug | ORAL_TABLET | Freq: Every day | ORAL | Status: DC
  Administered 2023-10-30 – 2023-11-02 (×4): 150 ug via ORAL
  Filled 2023-10-29: qty 1
  Filled 2023-10-29: qty 3
  Filled 2023-10-29 (×2): qty 1

## 2023-10-29 MED ORDER — ASPIRIN 81 MG PO TBEC
81.0000 mg | DELAYED_RELEASE_TABLET | Freq: Every day | ORAL | Status: DC
  Administered 2023-10-30 – 2023-11-02 (×4): 81 mg via ORAL
  Filled 2023-10-29 (×4): qty 1

## 2023-10-29 MED ORDER — SODIUM CHLORIDE 0.9 % IV SOLN
1.0000 g | Freq: Once | INTRAVENOUS | Status: AC
  Administered 2023-10-29: 1 g via INTRAVENOUS
  Filled 2023-10-29: qty 10

## 2023-10-29 MED ORDER — SODIUM CHLORIDE 0.9 % IV SOLN
INTRAVENOUS | Status: AC
Start: 2023-10-29 — End: 2023-10-30

## 2023-10-29 MED ORDER — HYDRALAZINE HCL 20 MG/ML IJ SOLN
5.0000 mg | Freq: Four times a day (QID) | INTRAMUSCULAR | Status: DC | PRN
Start: 2023-10-29 — End: 2023-11-02

## 2023-10-29 MED ORDER — CLOPIDOGREL BISULFATE 75 MG PO TABS
75.0000 mg | ORAL_TABLET | Freq: Every day | ORAL | Status: DC
  Administered 2023-10-30 – 2023-10-31 (×2): 75 mg via ORAL
  Filled 2023-10-29 (×2): qty 1

## 2023-10-29 MED ORDER — SODIUM CHLORIDE 0.9 % IV BOLUS
1000.0000 mL | Freq: Once | INTRAVENOUS | Status: AC
  Administered 2023-10-29: 1000 mL via INTRAVENOUS

## 2023-10-29 MED ORDER — FLUTICASONE FUROATE-VILANTEROL 100-25 MCG/ACT IN AEPB
1.0000 | INHALATION_SPRAY | Freq: Every day | RESPIRATORY_TRACT | Status: DC
  Administered 2023-10-30 – 2023-11-02 (×4): 1 via RESPIRATORY_TRACT
  Filled 2023-10-29: qty 28

## 2023-10-29 MED ORDER — UMECLIDINIUM BROMIDE 62.5 MCG/ACT IN AEPB
1.0000 | INHALATION_SPRAY | Freq: Every day | RESPIRATORY_TRACT | Status: DC
  Administered 2023-10-30 – 2023-11-02 (×4): 1 via RESPIRATORY_TRACT
  Filled 2023-10-29: qty 7

## 2023-10-29 MED ORDER — ATORVASTATIN CALCIUM 20 MG PO TABS
40.0000 mg | ORAL_TABLET | Freq: Every day | ORAL | Status: DC
  Administered 2023-10-29: 40 mg via ORAL
  Filled 2023-10-29: qty 2

## 2023-10-29 NOTE — Assessment & Plan Note (Signed)
On synthroid.  Follow tsh.   

## 2023-10-29 NOTE — Assessment & Plan Note (Signed)
Weakness as outlined. Known history of CAD. EKG as outlined. Not eating. Concern regarding possible dehydration. Unable to stand without assistance. Discussed change in clinical status, weakness and increased sob with exertion.  To ER for further evaluation. EMS called for transport.

## 2023-10-29 NOTE — Assessment & Plan Note (Signed)
Atorvastatin 40 mg nightly, Plavix 75 mg daily, aspirin 81 mg daily resumed

## 2023-10-29 NOTE — Assessment & Plan Note (Signed)
Currently on avapro, coreg and amlodipine. Not taking lasix. Check metabolic panel.

## 2023-10-29 NOTE — Assessment & Plan Note (Signed)
Does report increased sob with exertion.  Weakness.  Oxygen desaturation with minimal exertion - in office - with attempts to weigh her. EKG - SR - no acute ischemic changes. No increased lower extremity swelling. Discussed the need for further w/u and evaluation, especially given the weakness.

## 2023-10-29 NOTE — H&P (Signed)
History and Physical   Alexa Pham ION:[id number] DOB: 1930/05/02 DOA: 10/29/2023  PCP: Dale Whipholt, MD  Outpatient Specialists: Dr. Jayme Cloud, pulmonologist Patient coming from: PCP clinic via EMS  I have personally briefly reviewed patient's old medical records in [facility] EMR.  Chief Concern: Shortness of breath, weakness  HPI: Alexa Pham is a 89 year old female with history of hyperlipidemia, hypertension, CAD requiring dual antiplatelet therapy, hypothyroid, recent rectal prolapse surgery, who presents emergency department for chief concerns of shortness of breath and weakness.  Patient came from outpatient PCP clinic and per PCP note, patient had SpO2 of 85% on baseline 2 L nasal cannula.  Patient was placed on 4 L nasal cannula.    Vitals in the ED showed temperature of 98.3, respiration rate of 16, heart rate of 77, blood pressure 128/61, SpO2 of 94% on 2 L nasal cannula.  Serum sodium is 140, potassium 4.6, chloride 95, bicarb 36, BUN of 41, serum creatinine 0.88, EGFR greater than 60, nonfasting blood glucose 145, WBC 9.2, hemoglobin 10.6, platelets of 313.  COVID/influenza A/influenza B/RSV PCR were negative.  ED treatment: Azithromycin 500 mg IV one-time dose, ceftriaxone 1 g IV, sodium chloride 1 L bolus. ------------------------------ At bedside, patient able to tell me her first and last name, her age, the current calendar year and the current location.  She reports that she is in the hospital because her oxygen was low at home.  She reports that she thinks the oxygen was 86% wearing her normal oxygen. She reports that she does not know the exact oxygen liter that she wears at home.  She reports that she is supposed to wear oxygen nightly and as needed during the day.  She denies known sick contacts.  She denies trauma to her person.  She reports not too much coughing. She denies chest pain, shortness of breath currently, nausea, vomiting, dysuria,  hematuria.  She denies syncopal event.  She reports she does feel better at this time compared to this morning before coming to the ED.  Social history: She lives at home with her husband.   ROS: Constitutional: no weight change, no fever ENT/Mouth: no sore throat, no rhinorrhea Eyes: no eye pain, no vision changes Cardiovascular: no chest pain, + dyspnea,  no edema, no palpitations Respiratory: no cough, no sputum, no wheezing Gastrointestinal: no nausea, no vomiting, no diarrhea, no constipation Genitourinary: no urinary incontinence, no dysuria, no hematuria Musculoskeletal: no arthralgias, no myalgias Skin: no skin lesions, no pruritus, Neuro: + weakness, no loss of consciousness, no syncope Psych: no anxiety, no depression, + decrease appetite Heme/Lymph: no bruising, no bleeding  ED Course: Discussed with EDP, patient requiring hospitalization for chief concerns of shortness of breath and weakness.  Assessment/Plan  Principal Problem:   Acute on chronic hypoxic respiratory failure (HCC) Active Problems:   Hypertension   Hypothyroidism   Hyperlipidemia   Hypomagnesemia   CAD (coronary artery disease)   Urge incontinence   Rectal prolapse   SUI (stress urinary incontinence, female)   Hypophosphatemia   Protein-calorie malnutrition, severe   Assessment and Plan:  * Acute on chronic hypoxic respiratory failure (HCC) Check procalcitonin on admission Continue with ceftriaxone 2 g IV daily, azithromycin 500 mg IV daily, to complete a 5-day course DuoNebs 4 times daily, 3 doses ordered on admission Oxygen therapy, as needed to maintain SpO2 greater than 92% Continuous pulse oximetry ordered on admission  Protein-calorie malnutrition, severe Registered dietitian has been consulted Check serum magnesium, phosphorus on admission  Hypophosphatemia Will check serum phosphorus level on admission  CAD (coronary artery disease) Atorvastatin 40 mg nightly, Plavix 75 mg  daily, aspirin 81 mg daily resumed  Hypomagnesemia Will check serum magnesium level on admission  Hyperlipidemia Home atorvastatin 40 mg nightly resumed  Hypothyroidism Home levothyroxine 150 mcg p.o. daily resumed on admission  Hypertension Amlodipine 2.5 mg p.o. twice daily, Coreg 6.25 mg p.o. twice daily were resumed on admission Hydralazine 5 mg IV every 6 hours as needed for SBP greater than 170, 5 days ordered  Patient is clinically dry at bedside-status post sodium chloride 1 L bolus per EDP, sodium chloride infusion at 125 mL/h, 1 day ordered on admission  Pending med reconciliation  Chart reviewed.   DVT prophylaxis: Heparin 5000 units subcutaneous every 8 hours Code Status: Full code Diet: N.p.o. except for sips of meds due to aspiration risk Family Communication: Patient states her husband knows she is being admitted to the hospital Disposition Plan: Pending clinical course Consults called: none at this time Admission status: Telemetry cardiac, inpatient  Past Medical History:  Diagnosis Date   Actinic keratosis 01/28/2020   R thumb proximal nail fold    Arthritis    Asthma    Atherosclerosis of abdominal aorta (HCC)    Atypical mole 05/13/2018   R distal lat thigh   Atypical mole 09/14/2014   left ant mid thigh   Atypical mole 01/19/2014   left paraspinal mid to upper back   Atypical mole 01/19/2014   right back mid to inf scapula   Atypical mole 01/19/2014   left posterior deltoid medial   Basal cell carcinoma 01/28/2020   left of midline mid dorsum nose   Basal cell carcinoma 01/06/2018   left preauricular    Basal cell carcinoma 11/22/2016   left upper lip   Basal cell carcinoma 03/17/2015   left lateral elbow   Basal cell carcinoma 07/22/2014   R medial inferior knee   Basal cell carcinoma 07/21/2020   Left dorsum hand. Nodular pattern. Excised 08/09/2020, margins free.   Basal cell carcinoma 02/23/2021   right dorsal hand (BCC/SCC), left  dorsal hand, right chin - treated with EDC   Breast cancer (HCC)    Left breast lumpectomy with radiation   Bronchiectasis (HCC)    CHF (congestive heart failure) (HCC)    Chronic anticoagulation    Coronary artery disease    Dyspnea on exertion    Family history of adverse reaction to anesthesia    son naseau   Family history of breast cancer    H/O partial mastectomy    LEFT   History of breast cancer    History of coronary artery stent placement 2021 09/04/2023   History of pneumonia    History of shingles    History of thyroidectomy, total    Hyperlipidemia    Hypertension    Hypomagnesemia    Hypothyroidism    Macular degeneration    Myocardial infarction [facility])    NSTEMI (non-ST elevated myocardial infarction) (HCC) 02/13/2022   Pneumonia    Pulmonary hypertension (HCC)    Rectal prolapse    Renal mass, left    Right ventricular enlargement    Squamous cell carcinoma of skin 01/06/2018   right dorsum hand at ring finger MCP   Squamous cell carcinoma of skin 07/08/2017   left distal dorsum forearm    SUI (stress urinary incontinence, female)    Urge incontinence    Past Surgical History:  Procedure Laterality Date  ABDOMINAL HYSTERECTOMY     ABDOMINAL HYSTERECTOMY     APPENDECTOMY     BREAST LUMPECTOMY     CATARACT EXTRACTION, BILATERAL     CHOLECYSTECTOMY     COLONOSCOPY WITH PROPOFOL N/A 09/03/2023   Procedure: COLONOSCOPY WITH PROPOFOL;  Surgeon: Regis Bill, MD;  Location: [facility] ENDOSCOPY;  Service: Endoscopy;  Laterality: N/A;   CORONARY STENT INTERVENTION N/A 07/26/2020   Procedure: CORONARY STENT INTERVENTION;  Surgeon: Marcina Millard, MD;  Location: [facility] INVASIVE CV LAB;  Service: Cardiovascular;  Laterality: N/A;   FLEXIBLE SIGMOIDOSCOPY N/A 09/04/2023   Procedure: FLEXIBLE SIGMOIDOSCOPY;  Surgeon: Karie Soda, MD;  Location: [facility] ORS;  Service: General;  Laterality: N/A;   LEFT HEART CATH AND CORONARY ANGIOGRAPHY Left 07/26/2020    Procedure: LEFT HEART CATH AND CORONARY ANGIOGRAPHY;  Surgeon: Lamar Blinks, MD;  Location: [facility] INVASIVE CV LAB;  Service: Cardiovascular;  Laterality: Left;   REPLACEMENT TOTAL KNEE Left    TONSILLECTOMY     TONSILLECTOMY AND ADENOIDECTOMY     Social History:  reports that she quit smoking about 50 years ago. Her smoking use included cigarettes. She started smoking about 56 years ago. She has a 9 pack-year smoking history. She has never been exposed to tobacco smoke. She has never used smokeless tobacco. She reports current alcohol use. She reports that she does not use drugs.  Allergies  Allergen Reactions   Ticagrelor Shortness Of Breath    Extreme    Oxycodone Nausea Only   Family History  Problem Relation Age of Onset   CAD Mother    Cancer Maternal Aunt        unk type   Leukemia Maternal Grandmother    Breast cancer Half-Sister        dx 12s   Family history: Family history reviewed and not pertinent.  Prior to Admission medications   Medication Sig Start Date End Date Taking? Authorizing Provider  ibandronate (BONIVA) 150 MG tablet Take 150 mg by mouth every 30 (thirty) days. 08/28/23  Yes [provider]  albuterol (PROVENTIL) (2.5 MG/3ML) 0.083% nebulizer solution Take 2.5 mg by nebulization 2 (two) times daily. 06/01/23   [provider]  amLODipine (NORVASC) 2.5 MG tablet Take 2.5 mg by mouth 2 (two) times daily. 02/20/23   [provider]  aspirin EC 81 MG EC tablet Take 1 tablet (81 mg total) by mouth daily. Swallow whole. 02/16/22   Amin, Ankit C, MD  atorvastatin (LIPITOR) 40 MG tablet TAKE 1 TABLET(40 MG) BY MOUTH DAILY 04/04/23   Dale Marienthal, MD  calcium citrate-vitamin D (CITRACAL+D) 315-200 MG-UNIT tablet Take 1 tablet by mouth 2 (two) times daily.    [provider]  carvedilol (COREG) 3.125 MG tablet Take 1 tablet (3.125 mg total) by mouth 2 (two) times daily as needed (SBP > 180). 09/17/23 09/16/24  Karie Soda, MD   carvedilol (COREG) 6.25 MG tablet Take 6.25 mg by mouth 2 (two) times daily with a meal. 10/10/22 10/10/23  [provider]  clopidogrel (PLAVIX) 75 MG tablet Take 1 tablet (75 mg total) by mouth daily. 04/04/23   Dale [place], MD  Fluticasone-Umeclidin-Vilant (TRELEGY ELLIPTA) 100-62.5-25 MCG/ACT AEPB Inhale 1 puff into the lungs daily. 01/21/23   Salena Saner, MD  furosemide (LASIX) 20 MG tablet Take 1 tablet (20 mg total) by mouth daily as needed for edema (SBP > 180). 09/16/23   Karie Soda, MD  HYDROcodone-acetaminophen (NORCO/VICODIN) 5-325 MG tablet Take 1 tablet by mouth every 6 (  six) hours as needed for severe pain (pain score 7-10). 09/17/23   Juliet Rude, PA-C  irbesartan (AVAPRO) 150 MG tablet Take 1 tablet (150 mg total) by mouth daily. Hold if SBP < 120 09/16/23 09/15/24  Karie Soda, MD  levothyroxine (SYNTHROID) 150 MCG tablet Take 1 tablet (150 mcg total) by mouth daily. 04/04/23   Dale [place], MD  loperamide (IMODIUM) 2 MG capsule Take 1 capsule (2 mg total) by mouth at bedtime. Hold if no BM in 24 hours 09/16/23   Karie Soda, MD  metoCLOPramide (REGLAN) 5 MG tablet Take 1 tablet (5 mg total) by mouth daily with breakfast. 09/17/23   Karie Soda, MD  Multiple Vitamins-Minerals (PRESERVISION AREDS 2 PO) Take 1 tablet by mouth 2 (two) times daily.     [provider]  polycarbophil (FIBERCON) 625 MG tablet Take 1 tablet (625 mg total) by mouth 2 (two) times daily. 09/16/23   Karie Soda, MD  potassium chloride (KLOR-CON) 10 MEQ tablet TAKE 1 TABLET(10 MEQ) BY MOUTH DAILY Patient taking differently: Take 10 mEq by mouth daily. 08/16/23   Dale [place], MD  sodium chloride HYPERTONIC 3 % nebulizer solution Take 4 mLs by nebulization in the morning and at bedtime. 07/12/23   [provider]   Physical Exam: Vitals:   10/29/23 1259 10/29/23 1535 10/29/23 1538 10/29/23 1825  BP: 128/61 (!) 144/73    Pulse: 77  76   Resp: 16     Temp: 98.3  F (36.8 C)   98 F (36.7 C)  TempSrc: Oral   Oral  SpO2: 92%  100%   Weight: 55.1 kg     Height: 5\' 6"  (1.676 m)      Constitutional: appears frail, calm Eyes: PERRL, lids and conjunctivae normal ENMT: Mucous membranes are dry. Posterior pharynx clear of any exudate or lesions. Age-appropriate dentition. Hearing appropriate Neck: normal, supple, no masses, no thyromegaly Respiratory: clear to auscultation bilaterally, no wheezing, no crackles. Normal respiratory effort. No accessory muscle use.  Cardiovascular: Regular rate and rhythm, no murmurs / rubs / gallops. No extremity edema. 2+ pedal pulses. No carotid bruits.  Abdomen: Scaphoid abdomen, no tenderness, no masses palpated, no hepatosplenomegaly. Bowel sounds positive.  Musculoskeletal: no clubbing / cyanosis. No joint deformity upper and lower extremities. Good ROM, no contractures, no atrophy. Normal muscle tone.  Skin: no rashes, lesions, ulcers. No induration Neurologic: Sensation intact. Strength 5/5 in all 4.  Psychiatric: Normal judgment and insight. Alert and oriented x 3. Normal mood.   EKG: independently reviewed, showing sinus rhythm with rate of 75, QTc 410  Chest x-ray on Admission: I personally reviewed and I agree with radiologist reading as below.  DG Chest 2 View Result Date: 10/29/2023 CLINICAL DATA:  Hypoxia.  Recent surgery. EXAM: CHEST - 2 VIEW COMPARISON:  Chest x-ray dated September 12, 2023. FINDINGS: The patient is rotated to the left. Unchanged mild cardiomegaly. Worsening patchy opacities throughout both lungs with more consolidative appearance at the right lung base. Unchanged small bilateral pleural effusions. No pneumothorax. No acute osseous abnormality. IMPRESSION: 1. Worsening multifocal pneumonia. 2. Unchanged small bilateral pleural effusions. Electronically Signed   By: Obie Dredge M.D.   On: 10/29/2023 13:46   Labs on Admission: I have personally reviewed following labs  CBC: Recent Labs   Lab 10/29/23 1306  WBC 9.2  HGB 10.6*  HCT 35.7*  MCV 100.0  PLT 313   Basic Metabolic Panel: Recent Labs  Lab 10/29/23 1306  NA 140  K 4.6  CL 95*  CO2 36*  GLUCOSE 145*  BUN 41*  CREATININE 0.88  CALCIUM 8.5*   GFR: Estimated Creatinine Clearance: 35.5 mL/min (by C-G formula based on SCr of 0.88 mg/dL).  Liver Function Tests: Recent Labs  Lab 10/29/23 1306  AST 35  ALT 23  ALKPHOS 63  BILITOT 0.8  PROT 6.9  ALBUMIN 2.6*   Urine analysis:    Component Value Date/Time   COLORURINE STRAW (A) 09/09/2023 0841   APPEARANCEUR CLEAR 09/09/2023 0841   APPEARANCEUR Cloudy (A) 08/17/2020 1023   LABSPEC 1.008 09/09/2023 0841   PHURINE 5.0 09/09/2023 0841   GLUCOSEU NEGATIVE 09/09/2023 0841   HGBUR NEGATIVE 09/09/2023 0841   BILIRUBINUR NEGATIVE 09/09/2023 0841   BILIRUBINUR Negative 08/17/2020 1023   KETONESUR NEGATIVE 09/09/2023 0841   PROTEINUR NEGATIVE 09/09/2023 0841   NITRITE NEGATIVE 09/09/2023 0841   LEUKOCYTESUR NEGATIVE 09/09/2023 0841   This document was prepared using Dragon Voice Recognition software and may include unintentional dictation errors.  Dr. Sedalia Muta [facility]  If 7PM-7AM, please contact overnight-coverage provider If 7AM-7PM, please contact day attending provider [email or web address]  10/29/2023, 8:09 PM

## 2023-10-29 NOTE — ED Provider Notes (Signed)
[facility] Provider Note    Event Date/Time   First MD Initiated Contact with Patient 10/29/23 1527     (approximate)   History   Shortness of Breath   HPI Alexa Pham is a 89 y.o. female with history of HTN, HLD, CAD, pulmonary hypertension, asthma, CKD stage II, CHF presenting today for shortness of breath.  Patient was discharged from rehab hospital 1 week ago following rectal prolapse surgery back in December.  She has had worsening weakness and fatigue.  Very poor appetite and not wanting to eat with weight loss because of this.  Requires 24/7 assistance at home now because of this.  Has had an intermittent cough but no congestion.  No chest pain, nausea, vomiting, abdominal pain, dysuria.  Was seen at her primary care doctor's office today where she was found to be hypoxic on her baseline 2 L.  She states in the past she was only on 2 L overnight but 2 weeks ago was now requiring it during the day.  Reviewed PCP note from today for her visit for worsening shortness of breath and weakness.  Noted hypoxia with any exertion at that visit.     Physical Exam   Triage Vital Signs: ED Triage Vitals [10/29/23 1259]  Encounter Vitals Group     BP 128/61     Systolic BP Percentile      Diastolic BP Percentile      Pulse Rate 77     Resp 16     Temp 98.3 F (36.8 C)     Temp Source Oral     SpO2 92 %     Weight 121 lb 7.6 oz (55.1 kg)     Height 5\' 6"  (1.676 m)     Head Circumference      Peak Flow      Pain Score 0     Pain Loc      Pain Education      Exclude from Growth Chart     Most recent vital signs: Vitals:   10/29/23 1535 10/29/23 1538  BP: (!) 144/73   Pulse:  76  Resp:    Temp:    SpO2:  100%   Physical Exam: I have reviewed the vital signs and nursing notes. General: Awake, alert, no acute distress.  Very fatigued appearing. Head:  Atraumatic, normocephalic.   ENT:  EOM intact, PERRL. Oral mucosa is pink and moist with no  lesions. Neck: Neck is supple with full range of motion, No meningeal signs. Cardiovascular:  RRR, No murmurs. Peripheral pulses palpable and equal bilaterally. Respiratory:  Symmetrical chest wall expansion.  Scattered crackles throughout bilateral lungs.  Good air movement throughout.  No use of accessory muscles.   Musculoskeletal:  No cyanosis or edema. Moving extremities with full ROM Abdomen:  Soft, nontender, nondistended. Neuro:  GCS 15, moving all four extremities, interacting appropriately. Speech clear. Psych:  Calm, appropriate.   Skin:  Warm, dry, no rash.    ED Results / Procedures / Treatments   Labs (all labs ordered are listed, but only abnormal results are displayed) Labs Reviewed  CBC - Abnormal; Notable for the following components:      Result Value   RBC 3.57 (*)    Hemoglobin 10.6 (*)    HCT 35.7 (*)    MCHC 29.7 (*)    All other components within normal limits  COMPREHENSIVE METABOLIC PANEL - Abnormal; Notable for the following components:   Chloride 95 (*)  CO2 36 (*)    Glucose, Bld 145 (*)    BUN 41 (*)    Calcium 8.5 (*)    Albumin 2.6 (*)    All other components within normal limits  RESP PANEL BY RT-PCR (RSV, FLU A&B, COVID)  [id number]     EKG EKG interpretation: Rate of 75, sinus rhythm with occasional sinus arrhythmia.  No acute ST elevations or depressions.  Normal axis.  Normal intervals   RADIOLOGY Independently interpreted chest x-ray with evidence of multifocal pneumonia   PROCEDURES:  Critical Care performed: Yes, see critical care procedure note(s)  .Critical Care  Performed by: Janith Lima, MD Authorized by: Janith Lima, MD   Critical care provider statement:    Critical care time (minutes):  30   Critical care was necessary to treat or prevent imminent or life-threatening deterioration of the following conditions:  Respiratory failure   Critical care was time spent personally by me on the following activities:   Development of treatment plan with patient or surrogate, discussions with consultants, evaluation of patient's response to treatment, examination of patient, ordering and review of laboratory studies, ordering and review of radiographic studies, ordering and performing treatments and interventions, pulse oximetry, re-evaluation of patient's condition and review of old charts   I assumed direction of critical care for this patient from another provider in my specialty: no     Care discussed with: admitting provider      MEDICATIONS ORDERED IN ED: Medications  albuterol (VENTOLIN HFA) 108 (90 Base) MCG/ACT inhaler 2 puff (has no administration in time range)  cefTRIAXone (ROCEPHIN) 1 g in sodium chloride 0.9 % 100 mL IVPB (has no administration in time range)  azithromycin (ZITHROMAX) 500 mg in sodium chloride 0.9 % 250 mL IVPB (has no administration in time range)     IMPRESSION / MDM / ASSESSMENT AND PLAN / ED COURSE  I reviewed the triage vital signs and the nursing notes.                              Differential diagnosis includes, but is not limited to, pneumonia, viral URI, deconditioning, asthma/COPD exacerbation  Patient's presentation is most consistent with acute presentation with potential threat to life or bodily function.  Patient is a 89 year old female presenting today for hypoxia on her baseline oxygen.  Previously just on 2 L overnight and a progressive 2 L in the day within the past 2 weeks.  She was now hypoxic at her PCPs office and sent here for further evaluation.  Vital signs are stable but with any movement, patient does to get tachypneic and become hypoxic down to the 80s.  Required 2+ assistance to be able to stand in the chair and moved to the bed.  Chest x-ray shows evidence of multifocal pneumonia.  Laboratory workup with no other obvious acute findings.  Negative viral panel.  Given worsening hypoxia from her baseline with profound deconditioning and weakness, will  admit to hospitalist for further care.  Started on ceftriaxone azithromycin for pneumonia coverage.  The patient is on the cardiac monitor to evaluate for evidence of arrhythmia and/or significant heart rate changes. Clinical Course as of 10/29/23 1551  Tue Oct 29, 2023  1543 Patient dropping to 85% with any significant movement or with standing.  Requires 2+ standing.  Will admit to hospitalist and started on antibiotics [DW]    Clinical Course User Index [DW] Janith Lima,  MD     FINAL CLINICAL IMPRESSION(S) / ED DIAGNOSES   Final diagnoses:  Multifocal pneumonia  Acute hypoxic respiratory failure (HCC)     Rx / DC Orders   ED Discharge Orders     None        Note:  This document was prepared using Dragon voice recognition software and may include unintentional dictation errors.   Janith Lima, MD 10/29/23 [phone]

## 2023-10-29 NOTE — Assessment & Plan Note (Signed)
Check procalcitonin on admission Continue with ceftriaxone 2 g IV daily, azithromycin 500 mg IV daily, to complete a 5-day course DuoNebs 4 times daily, 3 doses ordered on admission Oxygen therapy, as needed to maintain SpO2 greater than 92% Continuous pulse oximetry ordered on admission

## 2023-10-29 NOTE — Assessment & Plan Note (Signed)
Registered dietitian has been consulted Check serum magnesium, phosphorus on admission

## 2023-10-29 NOTE — Assessment & Plan Note (Signed)
Home atorvastatin 40 mg nightly resumed

## 2023-10-29 NOTE — Assessment & Plan Note (Addendum)
Amlodipine 2.5 mg p.o. twice daily, Coreg 6.25 mg p.o. twice daily were resumed on admission Hydralazine 5 mg IV every 6 hours as needed for SBP greater than 170, 5 days ordered

## 2023-10-29 NOTE — Assessment & Plan Note (Signed)
Previous admission for NSTEMI.  ECHO with no wall motion abnormality.  EF 60%.  Recommended dual antiplatelet therapy and statin medication.  Per note review, off plavix for surgery. Confirmed with cardiology regarding remaining off plavix after procedure. Plans to resume aspirin therapy.  Recommended to remain off plavix. Continue f/u with cardiology.

## 2023-10-29 NOTE — Assessment & Plan Note (Signed)
On lipitor.  Follow lipid panel and liver function tests.   

## 2023-10-29 NOTE — Assessment & Plan Note (Signed)
Followed by pulmonary. Increased sob with exertion as outlined. No increased cough. Discussed f/u.

## 2023-10-29 NOTE — Assessment & Plan Note (Addendum)
Admitted 09/04/23 - 09/17/23 - for rectal prolapse - s/p robotic low anterior rectosigmoid resection with anastomosis.

## 2023-10-29 NOTE — Assessment & Plan Note (Signed)
Home levothyroxine 150 mcg p.o. daily resumed on admission

## 2023-10-29 NOTE — ED Triage Notes (Addendum)
Pt here with SOB. Pt had surgery 6 weeks ago and was just released, pt went to her primary today and was found to be hypoxic, no documentation of low oxygen level from primary. Pt wears 2L BNC chronically. Pt endorses bausea but denies pain.

## 2023-10-29 NOTE — Progress Notes (Deleted)
-   [address], [address] [place], [place] [address] [phone]  Hearing Aid Check     Paisley Sheffler comes for a scheduled appointment for a hearing aid check.  Time in:*** Time out:*** Accompanied by:***   Right Left  Hearing aid manufacturer Phonak Audeo P90- 13T [id number] Phonak Audeo P90- 13T [id number]  Hearing aid style [place] Receiver[place]  Hearing aid battery ZA 13 ZA 13  Receiver 1-Power 1 Power    custom earpiece c-shell- [id number] c-shell- [id number]  Retention wire n/a n/a  Warranty expiration date 06-25-2024   Loss and Damage unknown unknown  Initial fitting date 03-31-2021 03-31-2021  Device was fit at: Dr. [facility] clinic  Hearing aid services Bundled until:06-26-2023  Bundled until:06-26-2023     Chief complaint: Patient reports she need the hearing aids to be cleaned while Dr. Suszanne Conners cleans her ears.  Actions taken: Inspection of the device and listening check showed that ***.  Services fee: $0 was paid at checkout.  Patient was oriented about ***.  Recommend: Return for a hearing aid check, as needed. Return for a hearing evaluation and to see an ENT, if concerns with hearing changes arise.    Doralyn Kirkes MARIE LEROUX-MARTINEZ, AUD

## 2023-10-29 NOTE — Assessment & Plan Note (Signed)
Will check serum phosphorus level on admission

## 2023-10-29 NOTE — Assessment & Plan Note (Signed)
Has a documented history of pulmonary hypertension. Increased sob on exertion.  EKG as outlined.  On oxygen. Further evaluation as outlined.

## 2023-10-29 NOTE — Assessment & Plan Note (Signed)
Will check serum magnesium level on admission

## 2023-10-29 NOTE — Hospital Course (Signed)
Ms. Alexa Pham is a 89 year old female with history of hyperlipidemia, hypertension, CAD requiring dual antiplatelet therapy, hypothyroid, recent rectal prolapse surgery, who presents emergency department for chief concerns of shortness of breath and weakness.  Patient came from outpatient PCP clinic and per PCP note, patient had SpO2 of 85% on baseline 2 L nasal cannula.  Patient was placed on 4 L nasal cannula.    Vitals in the ED showed temperature of 98.3, respiration rate of 16, heart rate of 77, blood pressure 128/61, SpO2 of 94% on 2 L nasal cannula.  Serum sodium is 140, potassium 4.6, chloride 95, bicarb 36, BUN of 41, serum creatinine 0.88, EGFR greater than 60, nonfasting blood glucose 145, WBC 9.2, hemoglobin 10.6, platelets of 313.  COVID/influenza A/influenza B/RSV PCR were negative.  ED treatment: Azithromycin 500 mg IV one-time dose, ceftriaxone 1 g IV, sodium chloride 1 L bolus.

## 2023-10-29 NOTE — Assessment & Plan Note (Signed)
10/2022 - Dr Brandon - The left renal mass is in the region somewhat, overall growth rate is fairly minimal dating back to 22 months ago when first identified, on average 3 mm annually.  Recommended f/u scan one year.  

## 2023-10-29 NOTE — ED Triage Notes (Signed)
First Nurse Note: Patient to ED via [facility] from [facility]. PT had rectal prolapse surgery- at f/u appointment. PT more lethargic than normal- wears 2L [place] at baseline was 85% and bumped up to 4L . Has not been eating or drinking much the last few days.   98.5 73 HR

## 2023-10-29 NOTE — Progress Notes (Signed)
Subjective:    [name], female    DOB: Aug 14, 1930, 89 y.o.   MRN: [id number]  Patient here for hospital/rehab follow up.    HPI Hospital/rehab follow up - admitted 09/04/23 - 09/17/23 - for rectal prolapse - s/p robotic low anterior rectosigmoid resection with anastomosis. Post op, had decreased urine output and confusion. Rehydrated. CT head - no stroke. Electrolytes replaced. Pathology c/w inflammation. No cancer, tumor or proctitis. Fiber added to her bowel regimen. Imodium to help with fecal incontinence. Discharged to a skilled nursing facility. Discharged home from rehab 10/26/23. Here today for f/u. Still with weakness. Not eating well. Noted to be very weak when entering the clinic - stating "I don" feel well". Son reports increased sob with exertion. Oxygen on 2 liters walking to the scale - 85%. Had to be helped to sit down. Unable to stand on her own - reporting increased weakness.  Some light headedness and unsteadiness. Oxygen turned up initially and O2 sats increased to 98% on 4 liters.  Decreased oxygen to 3L and O2 sats 94-96% - sitting. Denies chest pain.  Does report the sob with exertion. Not eating. No abdominal pain.  Bowels are moving. Has been followed by urology for renal mass. Last seen 11/07/22. Growth rate minimal.  Had recommended continuing active surveillance with repeat CT scan one year.    Past Medical History:  Diagnosis Date   Actinic keratosis 01/28/2020   R thumb proximal nail fold    Arthritis    Asthma    Atherosclerosis of abdominal aorta (HCC)    Atypical mole 05/13/2018   R distal lat thigh   Atypical mole 09/14/2014   left ant mid thigh   Atypical mole 01/19/2014   left paraspinal mid to upper back   Atypical mole 01/19/2014   right back mid to inf scapula   Atypical mole 01/19/2014   left posterior deltoid medial   Basal cell carcinoma 01/28/2020   left of midline mid dorsum nose   Basal cell carcinoma 01/06/2018   left  preauricular    Basal cell carcinoma 11/22/2016   left upper lip   Basal cell carcinoma 03/17/2015   left lateral elbow   Basal cell carcinoma 07/22/2014   R medial inferior knee   Basal cell carcinoma 07/21/2020   Left dorsum hand. Nodular pattern. Excised 08/09/2020, margins free.   Basal cell carcinoma 02/23/2021   right dorsal hand (BCC/SCC), left dorsal hand, right chin - treated with EDC   Breast cancer (HCC)    Left breast lumpectomy with radiation   Bronchiectasis (HCC)    CHF (congestive heart failure) (HCC)    Chronic anticoagulation    Coronary artery disease    Dyspnea on exertion    Family history of adverse reaction to anesthesia    son naseau   Family history of breast cancer    H/O partial mastectomy    LEFT   History of breast cancer    History of coronary artery stent placement 2021 09/04/2023   History of pneumonia    History of shingles    History of thyroidectomy, total    Hyperlipidemia    Hypertension    Hypomagnesemia    Hypothyroidism    Macular degeneration    Myocardial infarction [facility])    NSTEMI (non-ST elevated myocardial infarction) (HCC) 02/13/2022   Pneumonia    Pulmonary hypertension (HCC)    Rectal prolapse    Renal mass, left    Right ventricular enlargement  Squamous cell carcinoma of skin 01/06/2018   right dorsum hand at ring finger MCP   Squamous cell carcinoma of skin 07/08/2017   left distal dorsum forearm    SUI (stress urinary incontinence, female)    Urge incontinence    Past Surgical History:  Procedure Laterality Date   ABDOMINAL HYSTERECTOMY     ABDOMINAL HYSTERECTOMY     APPENDECTOMY     BREAST LUMPECTOMY     CATARACT EXTRACTION, BILATERAL     CHOLECYSTECTOMY     COLONOSCOPY WITH PROPOFOL N/A 09/03/2023   Procedure: COLONOSCOPY WITH PROPOFOL;  Surgeon: Regis Bill, MD;  Location: [facility] ENDOSCOPY;  Service: Endoscopy;  Laterality: N/A;   CORONARY STENT INTERVENTION N/A 07/26/2020   Procedure: CORONARY  STENT INTERVENTION;  Surgeon: Marcina Millard, MD;  Location: [facility] INVASIVE CV LAB;  Service: Cardiovascular;  Laterality: N/A;   FLEXIBLE SIGMOIDOSCOPY N/A 09/04/2023   Procedure: FLEXIBLE SIGMOIDOSCOPY;  Surgeon: Karie Soda, MD;  Location: [facility] ORS;  Service: General;  Laterality: N/A;   LEFT HEART CATH AND CORONARY ANGIOGRAPHY Left 07/26/2020   Procedure: LEFT HEART CATH AND CORONARY ANGIOGRAPHY;  Surgeon: Lamar Blinks, MD;  Location: [facility] INVASIVE CV LAB;  Service: Cardiovascular;  Laterality: Left;   REPLACEMENT TOTAL KNEE Left    TONSILLECTOMY     TONSILLECTOMY AND ADENOIDECTOMY     Family History  Problem Relation Age of Onset   CAD Mother    Cancer Maternal Aunt        unk type   Leukemia Maternal Grandmother    Breast cancer Half-Sister        dx 36s   Social History   Socioeconomic History   Marital status: Widowed    Spouse name: Not on file   Number of children: Not on file   Years of education: Not on file   Highest education level: Not on file  Occupational History   Not on file  Tobacco Use   Smoking status: Former    Current packs/day: 0.00    Average packs/day: 1.5 packs/day for 6.0 years (9.0 ttl pk-yrs)    Types: Cigarettes    Start date: 6    Quit date: 18    Years since quitting: 50.0    Passive exposure: Never   Smokeless tobacco: Never  Vaping Use   Vaping status: Never Used  Substance and Sexual Activity   Alcohol use: Yes    Comment: very rare   Drug use: Never   Sexual activity: Not Currently  Other Topics Concern   Not on file  Social History Narrative   Married    Social Drivers of Health   Financial Resource Strain: Low Risk  (10/22/2022)   Overall Financial Resource Strain (CARDIA)    Difficulty of Paying Living Expenses: Not hard at all  Food Insecurity: No Food Insecurity (09/04/2023)   Hunger Vital Sign    Worried About Running Out of Food in the Last Year: Never true    Ran Out of Food in the Last Year: Never  true  Transportation Needs: No Transportation Needs (09/04/2023)   PRAPARE - Administrator, Civil Service (Medical): No    Lack of Transportation (Non-Medical): No  Physical Activity: Insufficiently Active (10/22/2022)   Exercise Vital Sign    Days of Exercise per Week: 2 days    Minutes of Exercise per Session: 40 min  Stress: No Stress Concern Present (10/22/2022)   [employer] of Occupational Health - Occupational Stress Questionnaire  Feeling of Stress : Not at all  Social Connections: Moderately Integrated (10/22/2022)   Social Connection and Isolation Panel [NHANES]    Frequency of Communication with Friends and Family: More than three times a week    Frequency of Social Gatherings with Friends and Family: More than three times a week    Attends Religious Services: More than 4 times per year    Active Member of [employer] or Organizations: Yes    Attends Banker Meetings: More than 4 times per year    Marital Status: Widowed     Review of Systems  Constitutional:  Positive for appetite change and fatigue.  HENT:  Negative for congestion and sinus pressure.   Respiratory:  Positive for shortness of breath. Negative for cough and chest tightness.   Cardiovascular:  Negative for chest pain and palpitations.       No increased lower extremity swelling.   Gastrointestinal:  Negative for abdominal pain and vomiting.  Genitourinary:  Negative for difficulty urinating and dysuria.  Musculoskeletal:  Negative for joint swelling and myalgias.  Skin:  Negative for color change and rash.  Neurological:  Positive for light-headedness. Negative for headaches.  Psychiatric/Behavioral:  Negative for agitation and dysphoric mood.        Objective:     BP 138/74   Pulse 73   Temp 98.5 F (36.9 C)   Resp 16   Ht 5\' 6"  (1.676 m)   Wt 121 lb 6.4 oz (55.1 kg)   SpO2 94%   PF (!) 4 L/min   BMI 19.59 kg/m  Wt Readings from Last 3 Encounters:  10/29/23 121  lb 7.6 oz (55.1 kg)  10/29/23 121 lb 6.4 oz (55.1 kg)  09/17/23 112 lb 10.5 oz (51.1 kg)   Unable to stand for orthostatics.   Physical Exam Vitals reviewed.  Constitutional:      General: She is not in acute distress.    Appearance: Normal appearance.  HENT:     Head: Normocephalic and atraumatic.     Right Ear: External ear normal.     Left Ear: External ear normal.  Eyes:     General: No scleral icterus.       Right eye: No discharge.        Left eye: No discharge.     Conjunctiva/sclera: Conjunctivae normal.  Neck:     Thyroid: No thyromegaly.  Cardiovascular:     Rate and Rhythm: Normal rate and regular rhythm.  Pulmonary:     Effort: No respiratory distress.     Breath sounds: Normal breath sounds. No wheezing.  Abdominal:     General: Bowel sounds are normal.     Palpations: Abdomen is soft.     Tenderness: There is no abdominal tenderness.  Musculoskeletal:        General: No tenderness.     Cervical back: Neck supple. No tenderness.     Comments: No increased swelling.   Lymphadenopathy:     Cervical: No cervical adenopathy.  Skin:    Findings: No erythema or rash.  Neurological:     Mental Status: She is alert.  Psychiatric:        Mood and Affect: Mood normal.        Behavior: Behavior normal.         Outpatient Encounter Medications as of 10/29/2023  Medication Sig   albuterol (PROVENTIL) (2.5 MG/3ML) 0.083% nebulizer solution Take 2.5 mg by nebulization 2 (two) times daily.   amLODipine (  NORVASC) 2.5 MG tablet Take 2.5 mg by mouth 2 (two) times daily.   aspirin EC 81 MG EC tablet Take 1 tablet (81 mg total) by mouth daily. Swallow whole.   atorvastatin (LIPITOR) 40 MG tablet TAKE 1 TABLET(40 MG) BY MOUTH DAILY   calcium citrate-vitamin D (CITRACAL+D) 315-200 MG-UNIT tablet Take 1 tablet by mouth 2 (two) times daily.   carvedilol (COREG) 3.125 MG tablet Take 1 tablet (3.125 mg total) by mouth 2 (two) times daily as needed (SBP > 180).   carvedilol  (COREG) 6.25 MG tablet Take 6.25 mg by mouth 2 (two) times daily with a meal.   clopidogrel (PLAVIX) 75 MG tablet Take 1 tablet (75 mg total) by mouth daily.   Fluticasone-Umeclidin-Vilant (TRELEGY ELLIPTA) 100-62.5-25 MCG/ACT AEPB Inhale 1 puff into the lungs daily.   furosemide (LASIX) 20 MG tablet Take 1 tablet (20 mg total) by mouth daily as needed for edema (SBP > 180).   HYDROcodone-acetaminophen (NORCO/VICODIN) 5-325 MG tablet Take 1 tablet by mouth every 6 (six) hours as needed for severe pain (pain score 7-10).   irbesartan (AVAPRO) 150 MG tablet Take 1 tablet (150 mg total) by mouth daily. Hold if SBP < 120   levothyroxine (SYNTHROID) 150 MCG tablet Take 1 tablet (150 mcg total) by mouth daily.   loperamide (IMODIUM) 2 MG capsule Take 1 capsule (2 mg total) by mouth at bedtime. Hold if no BM in 24 hours   metoCLOPramide (REGLAN) 5 MG tablet Take 1 tablet (5 mg total) by mouth daily with breakfast.   Multiple Vitamins-Minerals (PRESERVISION AREDS 2 PO) Take 1 tablet by mouth 2 (two) times daily.    polycarbophil (FIBERCON) 625 MG tablet Take 1 tablet (625 mg total) by mouth 2 (two) times daily.   potassium chloride (KLOR-CON) 10 MEQ tablet TAKE 1 TABLET(10 MEQ) BY MOUTH DAILY (Patient taking differently: Take 10 mEq by mouth daily.)   sodium chloride HYPERTONIC 3 % nebulizer solution Take 4 mLs by nebulization in the morning and at bedtime.   No facility-administered encounter medications on file as of 10/29/2023.    Medications reconciled with pt.   Lab Results  Component Value Date   WBC 6.9 09/12/2023   HGB 11.3 (L) 09/12/2023   HCT 35.7 (L) 09/12/2023   PLT 331 09/12/2023   GLUCOSE 114 (H) 09/16/2023   CHOL 126 07/24/2023   TRIG 51.0 07/24/2023   HDL 51.70 07/24/2023   LDLCALC 64 07/24/2023   ALT 40 09/16/2023   AST 45 (H) 09/16/2023   NA 145 09/16/2023   K 3.7 09/16/2023   CL 92 (L) 09/16/2023   CREATININE 0.98 09/16/2023   BUN 47 (H) 09/16/2023   CO2 >45 (H)  09/16/2023   TSH 0.804 09/08/2023   INR 1.1 02/13/2022   HGBA1C 5.8 (H) 08/23/2023    No results found.     Assessment & Plan:  Hypoxia -     EKG 12-Lead  Rectal prolapse Assessment & Plan: Admitted 09/04/23 - 09/17/23 - for rectal prolapse - s/p robotic low anterior rectosigmoid resection with anastomosis.    Shortness of breath Assessment & Plan: Does report increased sob with exertion.  Weakness.  Oxygen desaturation with minimal exertion - in office - with attempts to weigh her. EKG - SR - no acute ischemic changes. No increased lower extremity swelling. Discussed the need for further w/u and evaluation, especially given the weakness.    Pulmonary hypertension (HCC) Assessment & Plan: Has a documented history of pulmonary hypertension. Increased  sob on exertion.  EKG as outlined.  On oxygen. Further evaluation as outlined.    Kidney mass Assessment & Plan: 10/2022 - Dr Apolinar Junes - The left renal mass is in the region somewhat, overall growth rate is fairly minimal dating back to 22 months ago when first identified, on average 3 mm annually.  Recommended f/u scan one year.    Hypothyroidism, unspecified type Assessment & Plan: On synthroid.  Follow tsh.    Primary hypertension Assessment & Plan: Currently on avapro, coreg and amlodipine. Not taking lasix. Check metabolic panel.    Hypercholesterolemia Assessment & Plan: On lipitor. Follow lipid panel and liver function tests.    Coronary artery disease involving native coronary artery of native heart without angina pectoris Assessment & Plan: Previous admission for NSTEMI.  ECHO with no wall motion abnormality.  EF 60%.  Recommended dual antiplatelet therapy and statin medication.  Per note review, off plavix for surgery. Confirmed with cardiology regarding remaining off plavix after procedure. Plans to resume aspirin therapy.  Recommended to remain off plavix. Continue f/u with cardiology.    Bronchiectasis  without complication (HCC) Assessment & Plan: Followed by pulmonary. Increased sob with exertion as outlined. No increased cough. Discussed f/u.    Weakness Assessment & Plan: Weakness as outlined. Known history of CAD. EKG as outlined. Not eating. Concern regarding possible dehydration. Unable to stand without assistance. Discussed change in clinical status, weakness and increased sob with exertion.  To ER for further evaluation. EMS called for transport.       Dale Ackley, MD

## 2023-10-30 ENCOUNTER — Encounter: Payer: Self-pay | Admitting: Internal Medicine

## 2023-10-30 DIAGNOSIS — E43 Unspecified severe protein-calorie malnutrition: Secondary | ICD-10-CM | POA: Diagnosis not present

## 2023-10-30 DIAGNOSIS — K623 Rectal prolapse: Secondary | ICD-10-CM

## 2023-10-30 DIAGNOSIS — E039 Hypothyroidism, unspecified: Secondary | ICD-10-CM | POA: Diagnosis not present

## 2023-10-30 DIAGNOSIS — J9621 Acute and chronic respiratory failure with hypoxia: Secondary | ICD-10-CM | POA: Diagnosis not present

## 2023-10-30 DIAGNOSIS — J189 Pneumonia, unspecified organism: Secondary | ICD-10-CM | POA: Diagnosis not present

## 2023-10-30 DIAGNOSIS — I10 Essential (primary) hypertension: Secondary | ICD-10-CM

## 2023-10-30 DIAGNOSIS — N3941 Urge incontinence: Secondary | ICD-10-CM

## 2023-10-30 LAB — CBC
HCT: 33.9 % — ABNORMAL LOW (ref 36.0–46.0)
Hemoglobin: 10 g/dL — ABNORMAL LOW (ref 12.0–15.0)
MCH: 29.4 pg (ref 26.0–34.0)
MCHC: 29.5 g/dL — ABNORMAL LOW (ref 30.0–36.0)
MCV: 99.7 fL (ref 80.0–100.0)
Platelets: 283 10*3/uL (ref 150–400)
RBC: 3.4 MIL/uL — ABNORMAL LOW (ref 3.87–5.11)
RDW: 15.5 % (ref 11.5–15.5)
WBC: 7.3 10*3/uL (ref 4.0–10.5)
nRBC: 0 % (ref 0.0–0.2)

## 2023-10-30 LAB — BASIC METABOLIC PANEL
Anion gap: 9 (ref 5–15)
BUN: 35 mg/dL — ABNORMAL HIGH (ref 8–23)
CO2: 32 mmol/L (ref 22–32)
Calcium: 8 mg/dL — ABNORMAL LOW (ref 8.9–10.3)
Chloride: 102 mmol/L (ref 98–111)
Creatinine, Ser: 0.7 mg/dL (ref 0.44–1.00)
GFR, Estimated: >60 mL/min (ref >60)
Glucose, Bld: 78 mg/dL (ref 70–99)
Potassium: 4.5 mmol/L (ref 3.5–5.1)
Sodium: 143 mmol/L (ref 135–145)

## 2023-10-30 LAB — PHOSPHORUS: Phosphorus: 3.5 mg/dL (ref 2.5–4.6)

## 2023-10-30 LAB — PROCALCITONIN: Procalcitonin: <0.1 ng/mL

## 2023-10-30 LAB — MAGNESIUM: Magnesium: 2 mg/dL (ref 1.7–2.4)

## 2023-10-30 MED ORDER — ENSURE ENLIVE PO LIQD
237.0000 mL | Freq: Three times a day (TID) | ORAL | Status: DC
  Administered 2023-10-30 (×2): 237 mL via ORAL

## 2023-10-30 MED ORDER — ENSURE ENLIVE PO LIQD: 237.0000 mL | Freq: Three times a day (TID) | ORAL | Status: DC

## 2023-10-30 MED ORDER — ENSURE ENLIVE PO LIQD
237.0000 mL | Freq: Three times a day (TID) | ORAL | Status: DC
Start: 2023-10-30 — End: 2023-10-30

## 2023-10-30 NOTE — Telephone Encounter (Signed)
Please call her son and thank him for the update. Also, tell him to keep [place] posted.

## 2023-10-30 NOTE — ED Notes (Signed)
This NT assisted pt onto bedpan. Pt had bm while on bedpan, pt was then changed into new brief. Pt is now resting comfortably in bed.

## 2023-10-30 NOTE — ED Notes (Signed)
Pt sleeping. 

## 2023-10-30 NOTE — Progress Notes (Addendum)
Initial Nutrition Assessment  DOCUMENTATION CODES:   Not applicable  INTERVENTION:   -Obtain new wt -RD will follow for diet advancement and add supplements as appropriate  NUTRITION DIAGNOSIS:   Increased nutrient needs related to acute illness as evidenced by estimated needs.  GOAL:   Patient will meet greater than or equal to 90% of their needs  MONITOR:   Diet advancement, PO intake, Supplement acceptance  REASON FOR ASSESSMENT:   Consult Assessment of nutrition requirement/status  ASSESSMENT:   Pt with history of hyperlipidemia, hypertension, CAD requiring dual antiplatelet therapy, hypothyroid, recent rectal prolapse surgery, who presented for chief concerns of shortness of breath and weakness.  Pt admitted with acute on chronic respiratory failure.   Pt unavailable at time of visit. RD unable to obtain further nutrition-related history or complete nutrition-focused physical exam at this time.     Reviewed wt hx. Unsure if current wt is an actual weight or stated weight, as weight is identical from last few weight readings. RD will obtain new wt to better assess for weight trends. Based upon present wt hx; wt has been stable over the past 3 months.   Pt identified with severe malnutrition on prior hospitalization. RD suspects malnutrition is ongoing, but unable to identify at this time.   Per MD notes, pt currently NPO secondary to aspiration risk. RD will follow for diet advancement; pt has taken Ensure and Magic Cups well in the past.   Labs reviewed.   Diet Order:   Diet Order             Diet NPO time specified Except for: Sips with Meds, Ice Chips  Diet effective now                   EDUCATION NEEDS:   Not appropriate for education at this time  Skin:  Skin Assessment: Reviewed RN Assessment  Last BM:  10/30/23  Height:   Ht Readings from Last 1 Encounters:  10/29/23 5\' 6"  (1.676 m)    Weight:   Wt Readings from Last 1 Encounters:   10/29/23 55.1 kg    Ideal Body Weight:  59.1 kg  BMI:  Body mass index is 19.61 kg/m.  Estimated Nutritional Needs:   Kcal:  1650-1850  Protein:  85-100 grams  Fluid:  > 1.6 L    Levada Schilling, RD, LDN, CDCES Registered Dietitian III Certified Diabetes Care and Education Specialist If unable to reach this RD, please use "RD Inpatient" group chat on secure chat between hours of 8am-4 pm daily

## 2023-10-30 NOTE — ED Notes (Signed)
Patient is not interested in eating lunch. States she drinks chocolate ensures/day. Will request from the MD.

## 2023-10-30 NOTE — ED Notes (Signed)
Changed bed linen and brief

## 2023-10-30 NOTE — Telephone Encounter (Signed)
Spoke with son. Aware of below.

## 2023-10-30 NOTE — ED Notes (Signed)
Patient ws given breakfast tray. Ate less than 1/4 of breakfast.

## 2023-10-30 NOTE — ED Notes (Signed)
Assumed care of patient. Patient was resting in bed. Patient is on oxygen via nasal cannula. Patient is hard of hearing.

## 2023-10-30 NOTE — Progress Notes (Addendum)
Progress Note   Patient: Alexa Pham WUJ:[id number] DOB: 03-May-1930 DOA: 10/29/2023     1 DOS: the patient was seen and examined on 10/30/2023   Brief hospital course: Ms. Alexa Pham is a 89 year old female with history of hyperlipidemia, hypertension, CAD requiring dual antiplatelet therapy, hypothyroid, recent rectal prolapse surgery, who presents emergency department for chief concerns of shortness of breath and weakness.  Patient was found to be hypoxic at PCP office requiring 4 L supplemental oxygen. Chest x-ray showed multifocal pneumonia admitted for IV antibiotic therapy to hospitalist service.  Assessment and Plan: * Acute on chronic hypoxic respiratory failure (HCC) Multifocal pneumonia- Respiratory viral panel negative including COVID. Check urine Legionella, strep. Sputum culture ordered. Continue ceftriaxone and azithromycin therapy. Continue DuoNebs 4 times daily. Continue supplemental oxygen therapy to maintain SpO2 greater than 92%. Encourage oral diet, supplements. Encourage incentive spirometry, out of bed to chair.  Protein-calorie malnutrition, severe BMI 19.6. Dietitian consulted. Encourage oral diet, supplements. Monitor daily electrolytes.  CAD (coronary artery disease) Continue atorvastatin 40 mg nightly, Plavix 75 mg daily, aspirin 81 mg daily.  Hyperlipidemia Home atorvastatin 40 mg nightly resumed  Hypothyroidism Continue levothyroxine 150 mcg p.o. daily.  Hypertension BP stable. Home medications amlodipine, Coreg resumed IV hydralazine as needed ordered.  PT/ OT evaluation Out of bed to chair. Incentive spirometry. Nursing supportive care. Fall, aspiration precautions. DVT prophylaxis   Code Status: Full Code Son agreeable to palliative care consult.  Subjective: Patient is seen and examined today morning in ED hallway 32.  She has urge to have a bowel movement.  Mild respiratory distress noted.  She is currently on 3 L supplemental  oxygen.  She is accompanied by her son.  Physical Exam: Vitals:   10/30/23 1200 10/30/23 1230 10/30/23 1300 10/30/23 1347  BP: 116/81  127/67   Pulse: 74 84 69   Resp: 18 (!) 22 (!) 24   Temp:    98.5 F (36.9 C)  TempSrc:    Oral  SpO2:  98% 95%   Weight:      Height:        General - Elderly weak, thin built Caucasian female, mild respiratory distress HEENT - PERRLA, EOMI, atraumatic head, non tender sinuses. Lung - Clear, basal rales, rhonchi, no wheezes. Heart - S1, S2 heard, no murmurs, rubs, no pedal edema. Abdomen - Soft, non tender, shallow, bowel sounds good Neuro - Alert, awake and oriented x 3, non focal exam. Skin - Warm and dry.  Data Reviewed:      Latest Ref Rng & Units 10/30/2023    8:31 AM 10/29/2023    1:06 PM 09/12/2023    8:54 AM  CBC  WBC 4.0 - 10.5 K/uL 7.3  9.2  6.9   Hemoglobin 12.0 - 15.0 g/dL 95.6  21.3  08.6   Hematocrit 36.0 - 46.0 % 33.9  35.7  35.7   Platelets 150 - 400 K/uL 283  313  331       Latest Ref Rng & Units 10/30/2023    8:31 AM 10/29/2023    1:06 PM 09/16/2023   12:22 AM  BMP  Glucose 70 - 99 mg/dL 78  578  469   BUN 8 - 23 mg/dL 35  41  47   Creatinine 0.44 - 1.00 mg/dL 6.29  5.28  4.13   Sodium 135 - 145 mmol/L 143  140  145   Potassium 3.5 - 5.1 mmol/L 4.5  4.6  3.7   Chloride 98 - 111  mmol/L 102  95  92   CO2 22 - 32 mmol/L 32  36  >45   Calcium 8.9 - 10.3 mg/dL 8.0  8.5  8.7    DG Chest 2 View Result Date: 10/29/2023 CLINICAL DATA:  Hypoxia.  Recent surgery. EXAM: CHEST - 2 VIEW COMPARISON:  Chest x-ray dated September 12, 2023. FINDINGS: The patient is rotated to the left. Unchanged mild cardiomegaly. Worsening patchy opacities throughout both lungs with more consolidative appearance at the right lung base. Unchanged small bilateral pleural effusions. No pneumothorax. No acute osseous abnormality. IMPRESSION: 1. Worsening multifocal pneumonia. 2. Unchanged small bilateral pleural effusions. Electronically Signed   By:  Obie Dredge M.D.   On: 10/29/2023 13:46   Family Communication: Discussed with son at bedside, he understand and agree. All questions answereed.  Disposition: Status is: Inpatient Remains inpatient appropriate because: IV antibiotics, supplemental O2  Planned Discharge Destination: Home with Home Health vs rehab Recent discharge to SNF after rectal prolapse surgery.  Time spent: 40 minutes  Author: Marcelino Duster, MD 10/30/2023 1:49 PM Secure chat 7am to 7pm For on call review [email or web address].

## 2023-10-31 DIAGNOSIS — E43 Unspecified severe protein-calorie malnutrition: Secondary | ICD-10-CM | POA: Diagnosis not present

## 2023-10-31 DIAGNOSIS — J189 Pneumonia, unspecified organism: Secondary | ICD-10-CM | POA: Diagnosis not present

## 2023-10-31 DIAGNOSIS — E039 Hypothyroidism, unspecified: Secondary | ICD-10-CM | POA: Diagnosis not present

## 2023-10-31 DIAGNOSIS — J9621 Acute and chronic respiratory failure with hypoxia: Secondary | ICD-10-CM | POA: Diagnosis not present

## 2023-10-31 LAB — STREP PNEUMONIAE URINARY ANTIGEN: Strep Pneumo Urinary Antigen: NEGATIVE

## 2023-10-31 LAB — MRSA NEXT GEN BY PCR, NASAL: MRSA by PCR Next Gen: NOT DETECTED

## 2023-10-31 MED ORDER — ADULT MULTIVITAMIN W/MINERALS CH
1.0000 | ORAL_TABLET | Freq: Every day | ORAL | Status: DC
  Administered 2023-10-31 – 2023-11-02 (×3): 1 via ORAL
  Filled 2023-10-31 (×3): qty 1

## 2023-10-31 MED ORDER — ENSURE ENLIVE PO LIQD
237.0000 mL | Freq: Four times a day (QID) | ORAL | Status: DC
  Administered 2023-10-31 – 2023-11-02 (×8): 237 mL via ORAL

## 2023-10-31 MED ORDER — ALBUTEROL SULFATE (2.5 MG/3ML) 0.083% IN NEBU: 2.5000 mg | INHALATION_SOLUTION | RESPIRATORY_TRACT | Status: DC | PRN

## 2023-10-31 NOTE — Plan of Care (Signed)

## 2023-10-31 NOTE — Progress Notes (Signed)
Patient refused nebulizer after medicine was opened and scanned

## 2023-10-31 NOTE — Evaluation (Signed)
Physical Therapy Evaluation Patient Details Name: Alexa Pham MRN: [id number] DOB: Feb 06, 1930 Today's Date: 10/31/2023  History of Present Illness  Pt is a 89 y.o. female presenting to hospital from PCP office 10/29/23 with SOB; hypoxic at PCP's.  Pt discharged from rehab about 1 week prior following rectal prolapse surgery.  Pt with worsening weakness and fatigue.  Pt admitted with acute on chronic hypoxic respiratory failure, multifocal PNA, severe protein-calorie malnutrition, hypophosphatemia, CAD, hypomagnesemia.  PMH includes HLD, htn, CAD, basal cell carcinoma, L breast lumpectomy with radiation (breast CA), CHF, NSTEMI, L TKR.  Baseline 2 L supplemental O2 use; uses at night (PRN day) but recently requiring during the day as well.  Clinical Impression  PT/OT co-evaluation performed.  Pt's son present during session and reports pt was at rehab for 6 weeks and recently discharged to home on Saturday (returned to hospital Tuesday); family has been providing 24/7 assist since discharged home from rehab; has hired 24/7 assist but hadn't started yet.  Since discharged home from rehab, pt was ambulatory short house hold distances with walker with SBA; able to stand on own but required assist with bed mobility.  No c/o pain during session.  Pt's son reports pt has not been eating much.  Pt's HR 70 bpm and SpO2 sats 93% on 5 L O2 via nasal cannula at rest beginning of session.  Currently pt is mod assist semi-supine to sitting EOB; min assist x1 plus CGA of 2nd for transfers and walking short distance bed to recliner with RW use (2nd assist for safety and lines/leads).  SOB noted with activity (SpO2 sats 87% on 5 L O2 post activity (with HR 82 bpm) but recovered to 94% on 5 L O2 at rest end of session (with HR 73 bpm).  Pt would currently benefit from skilled PT to address noted impairments and functional limitations (see below for any additional details).  Upon hospital discharge, pt would benefit from  ongoing therapy.  Anticipate d/t generalized weakness and decreased activity tolerance, pt will require 24/7 assist and may need to be w/c level initially with functional mobility.     If plan is discharge home, recommend the following: A little help with walking and/or transfers;A little help with bathing/dressing/bathroom;Assistance with cooking/housework;Assist for transportation;Help with stairs or ramp for entrance;Other (comment) (24/7 assist with all functional mobility (may need to be w/c level initially))   Can travel by private vehicle        Equipment Recommendations None recommended by PT (Pt has RW and manual w/c at home already)  Recommendations for Other Services       Functional Status Assessment Patient has had a recent decline in their functional status and demonstrates the ability to make significant improvements in function in a reasonable and predictable amount of time.     Precautions / Restrictions Precautions Precautions: Fall Restrictions Weight Bearing Restrictions Per Provider Order: No      Mobility  Bed Mobility Overal bed mobility: Needs Assistance Bed Mobility: Supine to Sit     Supine to sit: Mod assist     General bed mobility comments: assist for trunk and to scoot to edge of bed    Transfers Overall transfer level: Needs assistance Equipment used: Rolling walker (2 wheels) Transfers: Sit to/from Stand Sit to Stand: Min assist, Contact guard assist, +2 safety/equipment (2nd assist for safety and lines/leads)           General transfer comment: assist to stand and control descent sitting; vc's for  UE placement    Ambulation/Gait Ambulation/Gait assistance: Min assist, +2 safety/equipment (2nd assist for safety and lines/leads management) Gait Distance (Feet): 3 Feet (bed to recliner) Assistive device: Rolling walker (2 wheels) Gait Pattern/deviations: Decreased step length - right, Decreased step length - left Gait velocity:  decreased        Stairs            Wheelchair Mobility     Tilt Bed    Modified Rankin (Stroke Patients Only)       Balance Overall balance assessment: Needs assistance Sitting-balance support: No upper extremity supported, Feet supported Sitting balance-Leahy Scale: Good Sitting balance - Comments: steady reaching within BOS   Standing balance support: Bilateral upper extremity supported, Reliant on assistive device for balance Standing balance-Leahy Scale: Fair Standing balance comment: steady static standing with B UE support on RW                             Pertinent Vitals/Pain Pain Assessment Pain Assessment: No/denies pain    Home Living Family/patient expects to be discharged to:: Private residence Living Arrangements: Alone Available Help at Discharge: Family;Personal care attendant;Available 24 hours/day (family has hired 24/7 assist that hasn't started yet) Type of Home: House Home Access: Stairs to enter   [employer] of Steps: 8, but has a chair lift   Home Layout: Able to live on main level with bedroom/bathroom Home Equipment: Grab bars - tub/shower;Rollator (4 wheels);Shower seat - built in;Wheelchair - manual      Prior Function Prior Level of Function : Needs assist       Physical Assist : ADLs (physical);Mobility (physical) Mobility (physical): Bed mobility ADLs (physical): IADLs;Dressing;Bathing Mobility Comments: 4WW for mobility short houshold distances (since discharge from rehab someone walks with her for safety--pt walks with assist to bathroom but goes into bathroom by herself); recently requiring assist for bed mobility (but can stand on own)       Extremity/Trunk Assessment   Upper Extremity Assessment Upper Extremity Assessment: Generalized weakness    Lower Extremity Assessment Lower Extremity Assessment: Generalized weakness    Cervical / Trunk Assessment Cervical / Trunk Assessment: Normal   Communication   Communication Communication: Hearing impairment (Very HOH (even with B hearing aides in)) Cueing Techniques: Verbal cues;Visual cues  Cognition Arousal: Alert Behavior During Therapy: WFL for tasks assessed/performed Overall Cognitive Status: Within Functional Limits for tasks assessed                                          General Comments General comments (skin integrity, edema, etc.): O2 sats dropping to 87% with mobility efforts on 5L via [place].  Nursing cleared pt for participation in physical therapy.  Pt agreeable to PT session.  Pt's son present during session.     Exercises     Assessment/Plan    PT Assessment Patient needs continued PT services  PT Problem List Decreased strength;Decreased activity tolerance;Decreased balance;Decreased mobility;Cardiopulmonary status limiting activity       PT Treatment Interventions DME instruction;Gait training;Functional mobility training;Therapeutic activities;Therapeutic exercise;Balance training;Patient/family education    PT Goals (Current goals can be found in the Care Plan section)  Acute Rehab PT Goals Patient Stated Goal: to improve strength and walking PT Goal Formulation: With patient/family Time For Goal Achievement: 11/14/23 Potential to Achieve Goals: Good    Frequency  Min 1X/week     Co-evaluation PT/OT/SLP Co-Evaluation/Treatment: Yes Reason for Co-Treatment: For patient/therapist safety;To address functional/ADL transfers PT goals addressed during session: Mobility/safety with mobility;Balance OT goals addressed during session: ADL's and self-care       AM-PAC PT "6 Clicks" Mobility  Outcome Measure Help needed turning from your back to your side while in a flat bed without using bedrails?: None Help needed moving from lying on your back to sitting on the side of a flat bed without using bedrails?: A Lot Help needed moving to and from a bed to a chair (including a  wheelchair)?: A Little Help needed standing up from a chair using your arms (e.g., wheelchair or bedside chair)?: A Little Help needed to walk in hospital room?: A Little Help needed climbing 3-5 steps with a railing? : A Lot 6 Click Score: 17    End of Session Equipment Utilized During Treatment: Oxygen (5 L via nasal cannula) Activity Tolerance: Patient limited by fatigue Patient left: in chair;with call bell/phone within reach;with chair alarm set;with family/visitor present Nurse Communication: Mobility status;Precautions;Other (comment) (Pt's SpO2 sats during session) PT Visit Diagnosis: Other abnormalities of gait and mobility (R26.89);Muscle weakness (generalized) (M62.81)    Time: 2440-1027 PT Time Calculation (min) (ACUTE ONLY): 27 min   Charges:   PT Evaluation $PT Eval Low Complexity: 1 Low   PT General Charges $$ ACUTE PT VISIT: 1 Visit        Hendricks Limes, PT 10/31/23, 2:23 PM

## 2023-10-31 NOTE — Evaluation (Signed)
Occupational Therapy Evaluation Patient Details Name: Alexa Pham MRN: [id number] DOB: [birth year] Today's Date: 10/31/2023   History of Present Illness Pt is a 89 y.o. female presenting to hospital from PCP office 10/29/23 with SOB; hypoxic at PCP's.  Pt discharged from rehab about 1 week prior following rectal prolapse surgery.  Pt with worsening weakness and fatigue.  Pt admitted with acute on chronic hypoxic respiratory failure, multifocal PNA, severe protein-calorie malnutrition, hypophosphatemia, CAD, hypomagnesemia.  PMH includes HLD, htn, CAD, basal cell carcinoma, L breast lumpectomy with radiation (breast CA), CHF, NSTEMI, L TKR.  Baseline 2 L supplemental O2 use; uses at night (PRN day) but recently requiring during the day as well.   Clinical Impression   Pt significantly HOH even with hearing aides in. Son present for OT/PT co-evaluation and able to verify PLOF and home setup. States pt recently at rehab for about 6 weeks. Was discharged and returned to hospital ~48 hours later, family has been providing 24/7 assist as pt is unable to get OOB without help. Has hired 24/7 caregivers. Pt was able to walk short distances and perform toileting tasks mod independent.  On eval, pt requires modA to perform bed mobility, minA +1 for transfers using RW (+2 for safety and mgmt of lines/leads), anticipate setup for UB grooming, up to minA for UB bathing/dressing, and up to max for LB bathing/ dressing. Pt presents with generalized weakness, impaired balance and decreased tolerance to activity.  Pt would benefit from skilled OT services to address noted impairments and functional limitations (see below for any additional details) in order to maximize safety and independence while minimizing falls risk and caregiver burden. Will require 24/7 assist at discharge. Anticipate the need for follow up [facility] OT services upon acute hospital DC.         If plan is discharge home, recommend the following: A  little help with walking and/or transfers;A little help with bathing/dressing/bathroom;Assistance with cooking/housework;Direct supervision/assist for medications management;Direct supervision/assist for financial management;Assist for transportation;Supervision due to cognitive status;Help with stairs or ramp for entrance    Functional Status Assessment  Patient has had a recent decline in their functional status and demonstrates the ability to make significant improvements in function in a reasonable and predictable amount of time.  Equipment Recommendations  BSC/3in1       Precautions / Restrictions Precautions Precautions: Fall Restrictions Weight Bearing Restrictions Per Provider Order: No      Mobility Bed Mobility Overal bed mobility: Needs Assistance Bed Mobility: Supine to Sit     Supine to sit: Mod assist     General bed mobility comments: assit for trunk, increased time and cuing needed    Transfers Overall transfer level: Needs assistance Equipment used: Rolling walker (2 wheels) Transfers: Sit to/from Stand, Bed to chair/wheelchair/BSC Sit to Stand: Min assist, +2 safety/equipment, Contact guard assist                  Balance Overall balance assessment: Needs assistance Sitting-balance support: No upper extremity supported Sitting balance-Leahy Scale: Good     Standing balance support: Bilateral upper extremity supported, During functional activity                               ADL either performed or assessed with clinical judgement   ADL Overall ADL's : Needs assistance/impaired Eating/Feeding: Set up;Sitting Eating/Feeding Details (indicate cue type and reason): per son, pt has lost appetite and only eats a  few bites at time. drinks Ensure. Will likely require encourgement for intake Grooming: Sitting;Wash/dry face;Wash/dry hands;Set up Grooming Details (indicate cue type and reason): recliner Upper Body Bathing: Set up;Sitting    Lower Body Bathing: Moderate assistance;Sit to/from stand   Upper Body Dressing : Set up;Sitting   Lower Body Dressing: Moderate assistance;Sit to/from stand   Toilet Transfer: +2 for safety/equipment;Minimal assistance;BSC/3in1;Rolling walker (2 wheels) Toilet Transfer Details (indicate cue type and reason): simulated bed > recliner, +2 for line and lead mmgt Toileting- Clothing Manipulation and Hygiene: Sit to/from stand;Moderate assistance       Functional mobility during ADLs: +2 for safety/equipment;Minimal assistance;Rolling walker (2 wheels) General ADL Comments: Pt very HOH, requires significant visual cues to communicate. Anticipate setup for UB grooming, up to minA for UB bathing/dressing, and up to max for LB/bathing dressing.      Pertinent Vitals/Pain Pain Assessment Pain Assessment: No/denies pain     Extremity/Trunk Assessment Upper Extremity Assessment Upper Extremity Assessment: Generalized weakness   Lower Extremity Assessment Lower Extremity Assessment: Generalized weakness   Cervical / Trunk Assessment Cervical / Trunk Assessment: Normal   Communication Communication Communication: Hearing impairment (Very HOH (even with B hearing aides in)) Cueing Techniques: Verbal cues;Visual cues   Cognition Arousal: Alert Behavior During Therapy: WFL for tasks assessed/performed Overall Cognitive Status: Within Functional Limits for tasks assessed                                       General Comments  O2 sats dropping to 87% with mobility efforts on 5L via [place], increasing 93%>    Exercises     Shoulder Instructions      Home Living Family/patient expects to be discharged to:: Private residence Living Arrangements: Alone Available Help at Discharge: Family;Personal care attendant;Available 24 hours/day (family has hired 24/7 assist that hasn't started yet) Type of Home: House Home Access: Stairs to enter [employer] of Steps:  8, but has a chair lift   Home Layout: Able to live on main level with bedroom/bathroom     Bathroom Shower/Tub: Producer, television/film/video: Handicapped height     Home Equipment: Grab bars - tub/shower;Rollator (4 wheels);Shower seat - built in;Wheelchair - manual          Prior Functioning/Environment Prior Level of Function : Needs assist       Physical Assist : ADLs (physical);Mobility (physical) Mobility (physical): Bed mobility ADLs (physical): IADLs;Dressing;Bathing Mobility Comments: 4WW for mobility short houshold distances (since discharge from rehab someone walks with her for safety--pt walks with assist to bathroom but goes into bathroom by herself); recently requiring assist for bed mobility (but can stand on own)          OT Problem List: Decreased strength;Decreased range of motion;Decreased activity tolerance;Impaired balance (sitting and/or standing)      OT Treatment/Interventions: Self-care/ADL training;Therapeutic exercise;Neuromuscular education;Energy conservation;DME and/or AE instruction;Therapeutic activities;Patient/family education;Balance training    OT Goals(Current goals can be found in the care plan section) Acute Rehab OT Goals OT Goal Formulation: With patient/family Time For Goal Achievement: 11/14/23 Potential to Achieve Goals: Good  OT Frequency: Min 1X/week    Co-evaluation PT/OT/SLP Co-Evaluation/Treatment: Yes Reason for Co-Treatment: For patient/therapist safety;To address functional/ADL transfers PT goals addressed during session: Mobility/safety with mobility;Balance OT goals addressed during session: ADL's and self-care      AM-PAC OT "6 Clicks" Daily Activity     Outcome  Measure Help from another person eating meals?: A Little Help from another person taking care of personal grooming?: A Little Help from another person toileting, which includes using toliet, bedpan, or urinal?: A Little Help from another person  bathing (including washing, rinsing, drying)?: A Little Help from another person to put on and taking off regular upper body clothing?: A Little Help from another person to put on and taking off regular lower body clothing?: A Little 6 Click Score: 18   End of Session Equipment Utilized During Treatment: Gait belt;Rolling walker (2 wheels) Nurse Communication: Mobility status  Activity Tolerance: Patient tolerated treatment well Patient left: in chair;with call bell/phone within reach;with chair alarm set;with family/visitor present  OT Visit Diagnosis: Muscle weakness (generalized) (M62.81);Other abnormalities of gait and mobility (R26.89);Unsteadiness on feet (R26.81)                Time: 2952-8413 OT Time Calculation (min): 27 min Charges:  OT General Charges $OT Visit: 1 Visit OT Evaluation $OT Eval Low Complexity: 1 Low  Jeydan Barner L. Mikias Lanz, OTR/L  10/31/23, 3:51 PM

## 2023-10-31 NOTE — Plan of Care (Signed)
  Problem: Education: Goal: Knowledge of General Education information will improve Description: Including pain rating scale, medication(s)/side effects and non-pharmacologic comfort measures 10/31/2023 0248 by Myles Gip, RN Outcome: Progressing 10/31/2023 0247 by Myles Gip, RN Outcome: Progressing   Problem: Clinical Measurements: Goal: Will remain free from infection Outcome: Progressing   Problem: Clinical Measurements: Goal: Respiratory complications will improve 10/31/2023 0248 by Myles Gip, RN Outcome: Progressing 10/31/2023 0247 by Myles Gip, RN Outcome: Progressing   Problem: Clinical Measurements: Goal: Cardiovascular complication will be avoided 10/31/2023 0248 by Myles Gip, RN Outcome: Progressing 10/31/2023 0247 by Myles Gip, RN Outcome: Progressing   Problem: Elimination: Goal: Will not experience complications related to bowel motility 10/31/2023 0248 by Myles Gip, RN Outcome: Progressing 10/31/2023 0247 by Myles Gip, RN Outcome: Progressing   Problem: Elimination: Goal: Will not experience complications related to urinary retention 10/31/2023 0248 by Myles Gip, RN Outcome: Progressing 10/31/2023 0247 by Myles Gip, RN Outcome: Progressing   Problem: Pain Managment: Goal: General experience of comfort will improve and/or be controlled 10/31/2023 0248 by Myles Gip, RN Outcome: Progressing 10/31/2023 0247 by Myles Gip, RN Outcome: Progressing   Problem: Safety: Goal: Ability to remain free from injury will improve 10/31/2023 0248 by Myles Gip, RN Outcome: Progressing 10/31/2023 0247 by Myles Gip, RN Outcome: Progressing

## 2023-10-31 NOTE — Progress Notes (Signed)
Nutrition Follow-up  DOCUMENTATION CODES:   Severe malnutrition in context of chronic illness  INTERVENTION:   -Liberalize diet to regular for widest variety of meal selections -Magic cup TID with meals, each supplement provides 290 kcal and 9 grams of protein  -MVI with minerals daily -Ensure Enlive po QID, each supplement provides 350 kcal and 20 grams of protein.   NUTRITION DIAGNOSIS:   Severe Malnutrition related to chronic illness (CHF) as evidenced by moderate fat depletion, severe fat depletion, moderate muscle depletion, severe muscle depletion.  Ongoing  GOAL:   Patient will meet greater than or equal to 90% of their needs  Progressing   MONITOR:   PO intake, Supplement acceptance  REASON FOR ASSESSMENT:   Consult Assessment of nutrition requirement/status  ASSESSMENT:   Pt with history of hyperlipidemia, hypertension, CAD requiring dual antiplatelet therapy, hypothyroid, recent rectal prolapse surgery, who presented for chief concerns of shortness of breath and weakness.  1/22- advanced to heart healthy diet  Reviewed I/O's: +1.1 L x 24 hours  UOP: 300 ml x 24 hours  Pt lying in bed at time of visit. She responded minimally to voice or touch. She did not answer RD questions. When RD introduced self, pt replied "I don't want anymore food. Please take the rest away". RD discarded cereal, milk, and juice container.   Noted pt consumed no pancake, 90% of Ensure, and 100% of orange juice this morning. Per ED notes, pt reports that she historically is not interested in eating food and intake primarily cones from chocolate Ensure.   Reviewed wt hx. Unsure if current wt is an actual weight or stated weight, as weight is identical from last few weight readings. RD will obtain new wt to better assess for weight trends. Based upon present wt hx; wt has been stable over the past 3 months.   Pt currently on a heart healthy diet. Due to severe malnutrition and poor oral  intake, will liberalize diet to regular for widest variety of meal selections.   PT/OT evaluations pending.   Medications reviewed and include plavix and fibercon.   Labs reviewed.   NUTRITION - FOCUSED PHYSICAL EXAM:  Flowsheet Row Most Recent Value  Orbital Region Moderate depletion  Upper Arm Region Severe depletion  Thoracic and Lumbar Region Severe depletion  Buccal Region Moderate depletion  Temple Region Moderate depletion  Clavicle Bone Region Severe depletion  Clavicle and Acromion Bone Region Severe depletion  Scapular Bone Region Severe depletion  Dorsal Hand Severe depletion  Patellar Region Moderate depletion  Anterior Thigh Region Moderate depletion  Posterior Calf Region Moderate depletion  Edema (RD Assessment) Mild  Hair Reviewed  Eyes Reviewed  Mouth Reviewed  Skin Reviewed  Nails Reviewed       Diet Order:   Diet Order             Diet regular Room service appropriate? Yes; Fluid consistency: Thin  Diet effective now                   EDUCATION NEEDS:   No education needs have been identified at this time  Skin:  Skin Assessment: Reviewed RN Assessment  Last BM:  10/31/23 (type 5)  Height:   Ht Readings from Last 1 Encounters:  10/30/23 5\' 6"  (1.676 m)    Weight:   Wt Readings from Last 1 Encounters:  10/29/23 55.1 kg    Ideal Body Weight:  59.1 kg  BMI:  Body mass index is 19.61 kg/m.  Estimated Nutritional Needs:   Kcal:  1650-1850  Protein:  85-100 grams  Fluid:  > 1.6 L    Levada Schilling, RD, LDN, CDCES Registered Dietitian III Certified Diabetes Care and Education Specialist If unable to reach this RD, please use "RD Inpatient" group chat on secure chat between hours of 8am-4 pm daily

## 2023-10-31 NOTE — Progress Notes (Signed)
Progress Note   Patient: Alexa Pham [id number] DOB: 11-May-1930 DOA: 10/29/2023     2 DOS: the patient was seen and examined on 10/31/2023   Brief hospital course: Ms. Alexa Pham is a 89 year old female with history of hyperlipidemia, hypertension, CAD requiring dual antiplatelet therapy, hypothyroid, recent rectal prolapse surgery, who presents emergency department for chief concerns of shortness of breath and weakness.  Patient was found to be hypoxic at PCP office requiring 4 L supplemental oxygen. Chest x-ray showed multifocal pneumonia admitted for IV antibiotic therapy to hospitalist service.  Assessment and Plan: * Acute on chronic hypoxic respiratory failure (HCC) Multifocal pneumonia- Respiratory viral panel negative including COVID. Check urine strep negative MRSA PCR negative. Sputum culture ordered. Continue ceftriaxone and azithromycin therapy for 5 days. Continue DuoNebs 4 times daily. She I son 4-5L supplemental O2, usually on 2L at home. Continue supplemental oxygen therapy to maintain SpO2 greater than 92%. Will get repeat chest xray. Encourage incentive spirometry, out of bed to chair.  Protein-calorie malnutrition, severe BMI 19.6. Dietitian consulted. Encourage oral diet, supplements. Monitor daily electrolytes.  CAD (coronary artery disease) Continue atorvastatin 40 mg nightly, aspirin 81 mg daily. Plavix on hold per cardiology.  Hyperlipidemia Home atorvastatin 40 mg nightly.  Hypothyroidism Continue levothyroxine 150 mcg p.o. daily.  Hypertension BP stable. Continue amlodipine, Coreg. IV hydralazine as needed ordered.  PT/ OT evaluation Out of bed to chair. Incentive spirometry. Nursing supportive care. Fall, aspiration precautions. DVT prophylaxis   Code Status: Full Code Son agreeable to palliative care consult.  Subjective: Patient is seen and examined today morning.  She is lying comfortably. No family at bedside.  She is currently on  4-5 L supplemental oxygen. Per RN, she was hypoxic overnight.   Physical Exam: Vitals:   10/31/23 0830 10/31/23 0843 10/31/23 1210 10/31/23 1526  BP: 132/66  126/69 (!) 142/64  Pulse: 70 72 (!) 107 68  Resp: 20 14 20 18   Temp: 98.3 F (36.8 C)  98.6 F (37 C) 98.7 F (37.1 C)  TempSrc: Oral     SpO2: 93% 92% 93% 96%  Weight:      Height:        General - Elderly weak, thin built Caucasian female, mild respiratory distress HEENT - PERRLA, EOMI, atraumatic head, non tender sinuses. Lung - Clear, basal rales, rhonchi, no wheezes. Heart - S1, S2 heard, no murmurs, rubs, no pedal edema. Abdomen - Soft, non tender, shallow, bowel sounds good Neuro - Alert, awake and oriented x 3, non focal exam. Skin - Warm and dry.  Data Reviewed:      Latest Ref Rng & Units 10/30/2023    8:31 AM 10/29/2023    1:06 PM 09/12/2023    8:54 AM  CBC  WBC 4.0 - 10.5 K/uL 7.3  9.2  6.9   Hemoglobin 12.0 - 15.0 g/dL 56.2  13.0  86.5   Hematocrit 36.0 - 46.0 % 33.9  35.7  35.7   Platelets 150 - 400 K/uL 283  313  331       Latest Ref Rng & Units 10/30/2023    8:31 AM 10/29/2023    1:06 PM 09/16/2023   12:22 AM  BMP  Glucose 70 - 99 mg/dL 78  784  696   BUN 8 - 23 mg/dL 35  41  47   Creatinine 0.44 - 1.00 mg/dL 2.95  2.84  1.32   Sodium 135 - 145 mmol/L 143  140  145   Potassium 3.5 -  5.1 mmol/L 4.5  4.6  3.7   Chloride 98 - 111 mmol/L 102  95  92   CO2 22 - 32 mmol/L 32  36  >45   Calcium 8.9 - 10.3 mg/dL 8.0  8.5  8.7    No results found.  Family Communication: Discussed with son yesterday, he understand and agree. All questions answereed.  Disposition: Status is: Inpatient Remains inpatient appropriate because: IV antibiotics, supplemental O2  Planned Discharge Destination: Home with Home Health vs rehab Recent discharge to SNF after rectal prolapse surgery.  Time spent: 39 minutes  Author: Marcelino Duster, MD 10/31/2023 4:09 PM Secure chat 7am to 7pm For on call review  [email or web address].

## 2023-11-01 ENCOUNTER — Inpatient Hospital Stay: Payer: Medicare Other

## 2023-11-01 DIAGNOSIS — J90 Pleural effusion, not elsewhere classified: Secondary | ICD-10-CM | POA: Insufficient documentation

## 2023-11-01 DIAGNOSIS — N393 Stress incontinence (female) (male): Secondary | ICD-10-CM

## 2023-11-01 DIAGNOSIS — J9621 Acute and chronic respiratory failure with hypoxia: Secondary | ICD-10-CM | POA: Diagnosis not present

## 2023-11-01 DIAGNOSIS — J189 Pneumonia, unspecified organism: Secondary | ICD-10-CM | POA: Diagnosis not present

## 2023-11-01 LAB — LEGIONELLA PNEUMOPHILA SEROGP 1 UR AG: L. pneumophila Serogp 1 Ur Ag: NEGATIVE

## 2023-11-01 MED ORDER — ACETAMINOPHEN 325 MG PO TABS
650.0000 mg | ORAL_TABLET | Freq: Four times a day (QID) | ORAL | Status: DC | PRN
  Administered 2023-11-01: 650 mg via ORAL
  Filled 2023-11-01: qty 2

## 2023-11-01 NOTE — Plan of Care (Signed)

## 2023-11-01 NOTE — Progress Notes (Signed)
PT Cancellation Note  Patient Details Name: Alexa Pham MRN: [id number] DOB: 19-Oct-1929   Cancelled Treatment:    Reason Eval/Treat Not Completed: Patient declined, no reason specified.  Pt resting in bed upon PT arrival; pt declining therapy session.  Therapist attempted to encourage pt to participate in therapy but pt stating "go away so I can sleep".  Will re-attempt PT session at a later date/time.  Hendricks Limes, PT 11/01/23, 3:00 PM

## 2023-11-01 NOTE — Plan of Care (Signed)
Problem: Clinical Measurements: Goal: Will remain free from infection Outcome: Progressing Goal: Respiratory complications will improve Outcome: Progressing

## 2023-11-01 NOTE — Progress Notes (Signed)
Progress Note   Patient: Alexa Pham [id number] DOB: 06-25-1930 DOA: 10/29/2023     3 DOS: the patient was seen and examined on 11/01/2023   Brief hospital course: Ms. Alexa Pham is a 89 year old female with history of hyperlipidemia, hypertension, CAD requiring dual antiplatelet therapy, hypothyroid, recent rectal prolapse surgery, who presents emergency department for chief concerns of shortness of breath and weakness.  Patient was found to be hypoxic at PCP office requiring 4 L supplemental oxygen. Chest x-ray showed multifocal pneumonia admitted for IV antibiotic therapy to hospitalist service.  Assessment and Plan: * Acute on chronic hypoxic respiratory failure (HCC) Multifocal pneumonia Bilateral pleural effusions- Sputum culture ordered. Continue ceftriaxone and azithromycin therapy for 5 days. Continue DuoNebs 4 times daily. She is currently 4-5L supplemental O2, usually on 2L at home. Continue supplemental oxygen therapy to maintain SpO2 greater than 92%. Repeat chest xray shows bilateral enlarging pleural effusions. Encourage incentive spirometry, out of bed to chair.  Protein-calorie malnutrition, severe BMI 19.6. Encourage oral diet, supplements. Monitor daily electrolytes.  CAD (coronary artery disease) Continue atorvastatin 40 mg nightly, aspirin 81 mg daily. Plavix on hold per cardiology.  Hyperlipidemia Home atorvastatin 40 mg nightly.  Hypothyroidism Continue levothyroxine 150 mcg p.o. daily.  Hypertension BP stable. Continue amlodipine, Coreg. IV hydralazine as needed ordered.  PT/ OT evaluation Out of bed to chair. Incentive spirometry. Nursing supportive care. Fall, aspiration precautions. DVT prophylaxis   Code Status: Limited: Do not attempt resuscitation (DNR) -DNR-LIMITED -Do Not Intubate/DNI  Patient wished DNR, son at bedside confirmed with her.  CODE STATUS order changed. Palliative care consult pending.  Subjective: Patient is seen and  examined today morning.  She is lying comfortably. Son at bedside. She is hard of hearing. She is eating poor. Did not get out of bed. Able to do incentive spirometry. She is currently on 4-5 L supplemental oxygen.   Physical Exam: Vitals:   11/01/23 0338 11/01/23 1006 11/01/23 1228 11/01/23 1551  BP: (!) 187/68 126/66 134/75 132/71  Pulse: 78 62 69 61  Resp:   18 18  Temp: 98.4 F (36.9 C) 98.4 F (36.9 C) 97.8 F (36.6 C) 98.4 F (36.9 C)  TempSrc:  Oral  Oral  SpO2: 96% 94% 91% 95%  Weight:      Height:        General - Elderly weak, thin built Caucasian female, mild respiratory distress HEENT - PERRLA, EOMI, atraumatic head, non tender sinuses. Lung - Clear, basal rales, rhonchi, no wheezes. Heart - S1, S2 heard, no murmurs, rubs, no pedal edema. Abdomen - Soft, non tender, shallow, bowel sounds good Neuro - Alert, awake and oriented x 3, non focal exam. Skin - Warm and dry.  Data Reviewed:      Latest Ref Rng & Units 10/30/2023    8:31 AM 10/29/2023    1:06 PM 09/12/2023    8:54 AM  CBC  WBC 4.0 - 10.5 K/uL 7.3  9.2  6.9   Hemoglobin 12.0 - 15.0 g/dL 81.1  91.4  78.2   Hematocrit 36.0 - 46.0 % 33.9  35.7  35.7   Platelets 150 - 400 K/uL 283  313  331       Latest Ref Rng & Units 10/30/2023    8:31 AM 10/29/2023    1:06 PM 09/16/2023   12:22 AM  BMP  Glucose 70 - 99 mg/dL 78  956  213   BUN 8 - 23 mg/dL 35  41  47   Creatinine  0.44 - 1.00 mg/dL 5.62  1.30  8.65   Sodium 135 - 145 mmol/L 143  140  145   Potassium 3.5 - 5.1 mmol/L 4.5  4.6  3.7   Chloride 98 - 111 mmol/L 102  95  92   CO2 22 - 32 mmol/L 32  36  >45   Calcium 8.9 - 10.3 mg/dL 8.0  8.5  8.7    DG Chest 1 View Result Date: 11/01/2023 CLINICAL DATA:  Hypoxia. EXAM: CHEST  1 VIEW COMPARISON:  Chest x-ray dated October 29, 2023. FINDINGS: The patient remains rotated to the left. Unchanged mild cardiomegaly. Enlarging now moderate bilateral pleural effusions. Worsening aeration at the lung bases.  Unchanged opacity in the peripheral right upper lobe. No pneumothorax. No acute osseous abnormality. IMPRESSION: 1. Enlarging now moderate bilateral pleural effusions with worsening bibasilar atelectasis. 2. Unchanged opacity in the peripheral right upper lobe, suspicious for pneumonia. Electronically Signed   By: Obie Dredge M.D.   On: 11/01/2023 12:11    Family Communication: Discussed with son at bedside, he understand and agree. All questions answereed.  Disposition: Status is: Inpatient Remains inpatient appropriate because: IV antibiotics, supplemental O2, worsening pleural effusions  Planned Discharge Destination: Home with Home Health vs rehab Recent discharge to SNF after rectal prolapse surgery.  Time spent: 38 minutes  Author: Marcelino Duster, MD 11/01/2023 4:50 PM Secure chat 7am to 7pm For on call review [email or web address].

## 2023-11-01 NOTE — Care Management Important Message (Signed)
Important Message  Patient Details  Name: Alexa Pham MRN: [id number] Date of Birth: [birth year]   Important Message Given:  Yes - Medicare IM     Sherilyn Banker 11/01/2023, 10:38 AM

## 2023-11-01 NOTE — Plan of Care (Signed)
GOC consult noted. Patient is currently in a semi private room at this time. Per nursing patient is oriented, and hard of hearing. Due to the nature of GOC conversations and the need for a quiet atmosphere, without distractions for extended periods of time, PMT will be happy to have full GOC discussion when patient is in a private room. Please re-consult PMT when patient is in a private room.

## 2023-11-02 ENCOUNTER — Inpatient Hospital Stay: Payer: Medicare Other

## 2023-11-02 DIAGNOSIS — J189 Pneumonia, unspecified organism: Secondary | ICD-10-CM | POA: Diagnosis not present

## 2023-11-02 DIAGNOSIS — J90 Pleural effusion, not elsewhere classified: Secondary | ICD-10-CM | POA: Diagnosis not present

## 2023-11-02 DIAGNOSIS — J9621 Acute and chronic respiratory failure with hypoxia: Secondary | ICD-10-CM | POA: Diagnosis not present

## 2023-11-02 LAB — BASIC METABOLIC PANEL
Anion gap: 7 (ref 5–15)
BUN: 22 mg/dL (ref 8–23)
CO2: 39 mmol/L — ABNORMAL HIGH (ref 22–32)
Calcium: 8.6 mg/dL — ABNORMAL LOW (ref 8.9–10.3)
Chloride: 95 mmol/L — ABNORMAL LOW (ref 98–111)
Creatinine, Ser: 0.79 mg/dL (ref 0.44–1.00)
GFR, Estimated: >60 mL/min (ref >60)
Glucose, Bld: 177 mg/dL — ABNORMAL HIGH (ref 70–99)
Potassium: 4.8 mmol/L (ref 3.5–5.1)
Sodium: 141 mmol/L (ref 135–145)

## 2023-11-02 LAB — CBC
HCT: 38.9 % (ref 36.0–46.0)
Hemoglobin: 11.3 g/dL — ABNORMAL LOW (ref 12.0–15.0)
MCH: 29.7 pg (ref 26.0–34.0)
MCHC: 29 g/dL — ABNORMAL LOW (ref 30.0–36.0)
MCV: 102.4 fL — ABNORMAL HIGH (ref 80.0–100.0)
Platelets: 363 10*3/uL (ref 150–400)
RBC: 3.8 MIL/uL — ABNORMAL LOW (ref 3.87–5.11)
RDW: 15.2 % (ref 11.5–15.5)
WBC: 11.5 10*3/uL — ABNORMAL HIGH (ref 4.0–10.5)
nRBC: 0 % (ref 0.0–0.2)

## 2023-11-02 MED ORDER — ACETAMINOPHEN 650 MG RE SUPP: 650.0000 mg | Freq: Four times a day (QID) | RECTAL | Status: DC | PRN

## 2023-11-02 MED ORDER — ONDANSETRON 4 MG PO TBDP: 4.0000 mg | ORAL_TABLET | Freq: Four times a day (QID) | ORAL | Status: DC | PRN

## 2023-11-02 MED ORDER — GLYCOPYRROLATE 0.2 MG/ML IJ SOLN: 0.2000 mg | INTRAMUSCULAR | Status: DC | PRN

## 2023-11-02 MED ORDER — HALOPERIDOL LACTATE 5 MG/ML IJ SOLN: 0.5000 mg | INTRAMUSCULAR | Status: DC | PRN

## 2023-11-02 MED ORDER — POLYVINYL ALCOHOL 1.4 % OP SOLN: 1.0000 [drp] | Freq: Four times a day (QID) | OPHTHALMIC | Status: DC | PRN

## 2023-11-02 MED ORDER — HALOPERIDOL 0.5 MG PO TABS: 0.5000 mg | ORAL_TABLET | ORAL | Status: DC | PRN

## 2023-11-02 MED ORDER — BIOTENE DRY MOUTH MT LIQD: 15.0000 mL | OROMUCOSAL | Status: DC | PRN

## 2023-11-02 MED ORDER — PROMETHAZINE HCL 6.25 MG/5ML PO SOLN
12.5000 mg | Freq: Four times a day (QID) | ORAL | Status: DC | PRN
  Filled 2023-11-02 (×2): qty 10

## 2023-11-02 MED ORDER — FUROSEMIDE 10 MG/ML IJ SOLN
20.0000 mg | Freq: Every day | INTRAMUSCULAR | Status: DC
  Filled 2023-11-02: qty 2

## 2023-11-02 MED ORDER — HALOPERIDOL LACTATE 2 MG/ML PO CONC: 0.5000 mg | ORAL | Status: DC | PRN

## 2023-11-02 MED ORDER — ONDANSETRON HCL 4 MG/2ML IJ SOLN: 4.0000 mg | Freq: Four times a day (QID) | INTRAMUSCULAR | Status: DC | PRN

## 2023-11-02 MED ORDER — GLYCOPYRROLATE 1 MG PO TABS: 1.0000 mg | ORAL_TABLET | ORAL | Status: DC | PRN

## 2023-11-02 MED ORDER — ACETAMINOPHEN 325 MG PO TABS: 650.0000 mg | ORAL_TABLET | Freq: Four times a day (QID) | ORAL | Status: DC | PRN

## 2023-11-02 NOTE — Progress Notes (Signed)
Rapid response called as patient is most sleepy, lethargic, hypoxic.  During my exam, patient has shallow breathing, not responding to painful stimuli.  RT at bedside, bagged her and check the airway for any foreign body.  I ordered x-ray, labs.  Advised to place her on 100% nonrebreather.  I discussed with son over phone and updated patient's current clinical condition.  Patient's son understands that her condition is poor and overall prognosis is grim.  As per her wishes, he advised to keep her more comfortable.  Patient's son hopes that she will be alive until tomorrow when he can come visit her. I placed a DNR -comfort care orders.  RN notified.

## 2023-11-02 NOTE — Progress Notes (Addendum)
Chaplain responds to rapid response code and provides compassionate presence as team provides care. No family at bedside.

## 2023-11-02 NOTE — Progress Notes (Signed)
Progress Note   Patient: Alexa Pham [id number] DOB: September 05, 1930 DOA: 10/29/2023     4 DOS: the patient was seen and examined on 11/02/2023   Brief hospital course: Ms. Alexa Pham is a 89 year old female with history of hyperlipidemia, hypertension, CAD requiring dual antiplatelet therapy, hypothyroid, recent rectal prolapse surgery, who presents emergency department for chief concerns of shortness of breath and weakness.  Patient was found to be hypoxic at PCP office requiring 4 L supplemental oxygen. Chest x-ray showed multifocal pneumonia admitted for IV antibiotic therapy to hospitalist service.  Assessment and Plan: * Acute on chronic hypoxic respiratory failure (HCC) Multifocal pneumonia Bilateral pleural effusions- Sputum culture ordered. Continue ceftriaxone and azithromycin therapy for 5 days. Continue DuoNebs 4 times daily. She is currently 6L supplemental O2, usually on 2L at home. Continue supplemental oxygen therapy to maintain SpO2 greater than 92%. Repeat chest xray shows bilateral enlarging pleural effusions. Started IV Lasix 20mg  IV daily.  Encourage incentive spirometry, out of bed to chair.  Nausea and vomiting: Continue IV zofran as needed. Ordered. Phenergan for persistent symptoms. Clears for now and advance as tolerated.  Protein-calorie malnutrition, severe BMI 19.6. Encourage oral diet, supplements. Monitor daily electrolytes.  CAD (coronary artery disease) Continue atorvastatin 40 mg nightly, aspirin 81 mg daily. Plavix on hold per cardiology.  Hyperlipidemia Home atorvastatin 40 mg nightly.  Hypothyroidism Continue levothyroxine 150 mcg p.o. daily.  Hypertension BP stable. Continue amlodipine, Coreg. IV hydralazine as needed ordered.  PT/ OT evaluation Out of bed to chair. Incentive spirometry. Nursing supportive care. Fall, aspiration precautions. DVT prophylaxis   Code Status: Limited: Do not attempt resuscitation (DNR) -DNR-LIMITED  -Do Not Intubate/DNI  Patient wished DNR, son at bedside confirmed with her.   Palliative care consult pending.  Subjective: Patient is seen and examined today morning.  She has nausea and vomiting per RN. Seems in distress. She is currently on 6 L supplemental oxygen.   Physical Exam: Vitals:   11/01/23 1952 11/01/23 2337 11/02/23 0453 11/02/23 0924  BP: 122/60 136/73 (!) 162/68 (!) 154/79  Pulse: 63 62 61 81  Resp: 19 16 19 16   Temp: 97.6 F (36.4 C) 98 F (36.7 C) 98.1 F (36.7 C) 98.3 F (36.8 C)  TempSrc: Oral Oral  Oral  SpO2: 93% 95% 93% 91%  Weight:      Height:        General - Elderly weak, thin built Caucasian female, in distress due to nausea. HEENT - PERRLA, EOMI, atraumatic head, non tender sinuses. Lung - Clear, basal rales, diffuse rhonchi, no wheezes. Heart - S1, S2 heard, no murmurs, rubs, no pedal edema. Abdomen - Soft, non tender, shallow, bowel sounds good Neuro - Alert, awake and oriented, non focal exam. Skin - Warm and dry.  Data Reviewed:      Latest Ref Rng & Units 10/30/2023    8:31 AM 10/29/2023    1:06 PM 09/12/2023    8:54 AM  CBC  WBC 4.0 - 10.5 K/uL 7.3  9.2  6.9   Hemoglobin 12.0 - 15.0 g/dL 95.6  21.3  08.6   Hematocrit 36.0 - 46.0 % 33.9  35.7  35.7   Platelets 150 - 400 K/uL 283  313  331       Latest Ref Rng & Units 10/30/2023    8:31 AM 10/29/2023    1:06 PM 09/16/2023   12:22 AM  BMP  Glucose 70 - 99 mg/dL 78  578  469   BUN 8 -  23 mg/dL 35  41  47   Creatinine 0.44 - 1.00 mg/dL 4.09  8.11  9.14   Sodium 135 - 145 mmol/L 143  140  145   Potassium 3.5 - 5.1 mmol/L 4.5  4.6  3.7   Chloride 98 - 111 mmol/L 102  95  92   CO2 22 - 32 mmol/L 32  36  >45   Calcium 8.9 - 10.3 mg/dL 8.0  8.5  8.7    DG Chest 1 View Result Date: 11/01/2023 CLINICAL DATA:  Hypoxia. EXAM: CHEST  1 VIEW COMPARISON:  Chest x-ray dated October 29, 2023. FINDINGS: The patient remains rotated to the left. Unchanged mild cardiomegaly. Enlarging now moderate  bilateral pleural effusions. Worsening aeration at the lung bases. Unchanged opacity in the peripheral right upper lobe. No pneumothorax. No acute osseous abnormality. IMPRESSION: 1. Enlarging now moderate bilateral pleural effusions with worsening bibasilar atelectasis. 2. Unchanged opacity in the peripheral right upper lobe, suspicious for pneumonia. Electronically Signed   By: Obie Dredge M.D.   On: 11/01/2023 12:11    Family Communication: Discussed with son at bedside yesterday, he understand and agree. All questions answereed.  Disposition: Status is: Inpatient Remains inpatient appropriate because: IV antibiotics, supplemental O2, worsening pleural effusions, nausea/ vomiting.  Planned Discharge Destination: Home with Home Health vs rehab Recent discharge to SNF after rectal prolapse surgery.  Time spent: 39 minutes  Author: Marcelino Duster, MD 11/02/2023 12:59 PM Secure chat 7am to 7pm For on call review [email or web address].

## 2023-11-02 NOTE — TOC Initial Note (Signed)
Transition of Care [facility]) - Initial/Assessment Note    Patient Details  Name: Alexa Pham MRN: [id number] Date of Birth: 24-May-1930  Transition of Care [facility]) CM/SW Contact:    Rodney Langton, RN Phone Number: 11/02/2023, 1:51 PM  Clinical Narrative:                  Spoke with son, [name], state patient lives alone but has had PCS for additional support in the home in the past.  He feels patient will need to have 24/7 supervision, they will plan for this prior to discharge.  Aware that recommendations are for home with home health, already active with [facility], agrees to restart services at discharge, [place] notified.   Expected Discharge Plan: Home w Home Health Services Barriers to Discharge: Continued Medical Work up   Patient Goals and CMS Choice            Expected Discharge Plan and Services     Post Acute Care Choice: Home Health Living arrangements for the past 2 months: Single Family Home                           HH Arranged: PT, OT HH Agency: [facility] Date [facility] Agency Contacted: 11/02/23 Time HH Agency Contacted: [phone] Representative spoke with at [facility] Agency: Elnita Maxwell  Prior Living Arrangements/Services Living arrangements for the past 2 months: Single Family Home Lives with:: Self   Do you feel safe going back to the place where you live?: Yes          Current home services: DME, Home OT, Home PT, Homehealth aide    Activities of Daily Living   ADL Screening (condition at time of admission) Independently performs ADLs?: No Does the patient have a NEW difficulty with bathing/dressing/toileting/self-feeding that is expected to last >3 days?: No Does the patient have a NEW difficulty with getting in/out of bed, walking, or climbing stairs that is expected to last >3 days?: No Does the patient have a NEW difficulty with communication that is expected to last >3 days?: No Is the patient deaf or have difficulty hearing?: Yes Does the  patient have difficulty seeing, even when wearing glasses/contacts?: No Does the patient have difficulty concentrating, remembering, or making decisions?: No  Permission Sought/Granted                  Emotional Assessment              Admission diagnosis:  Multifocal pneumonia [J18.9] Acute hypoxic respiratory failure (HCC) [J96.01] Acute on chronic hypoxic respiratory failure (HCC) [J96.21] Patient Active Problem List   Diagnosis Date Noted   Pleural effusion, bilateral 11/01/2023   Weakness 10/29/2023   Acute on chronic hypoxic respiratory failure (HCC) 10/29/2023   HOH (hard of hearing) 09/17/2023   CHF (congestive heart failure) (HCC)    CKD (chronic kidney disease) stage 2, GFR 60-89 ml/min 09/16/2023   Protein-calorie malnutrition, severe 09/13/2023   Delirium 09/10/2023   Hypophosphatemia 09/10/2023   AKI (acute kidney injury) (HCC) 09/06/2023   Hypotension 09/06/2023   Macrocytic anemia 09/06/2023   Hypocalcemia 09/06/2023   Moderate protein malnutrition (HCC) 09/06/2023   History of non-ST elevation myocardial infarction (NSTEMI) 09/04/2023   History of coronary artery stent placement 2021 09/04/2023   Complete rectal prolapse 09/04/2023   Chronic anticoagulation    SUI (stress urinary incontinence, female)    Asthma    Nocturnal hypoxia 04/08/2023   Pulmonary hypertension (  HCC) 04/07/2023   Bilateral lower extremity edema 02/20/2023   Rectal prolapse 02/04/2023   Right ventricular enlargement 01/28/2023   Urge incontinence 11/08/2022   Shortness of breath 10/04/2022   Cough 06/24/2022   Fatigue 06/03/2022   Nocturia 06/03/2022   Premature beats 05/31/2022   Osteopenia 02/18/2022   NSTEMI (non-ST elevated myocardial infarction) (HCC) 02/13/2022   Basal cell carcinoma 02/23/2021   Genetic testing 08/31/2020   Family history of breast cancer    Kidney mass 08/08/2020   CAD (coronary artery disease) 07/26/2020   Angina pectoris (HCC) 07/04/2020    Abnormal liver function test 06/15/2020   Dizziness 05/04/2020   Elevated troponin 05/04/2020   Hypomagnesemia 05/04/2020   Orthostatic hypotension 05/04/2020   Shingles 01/10/2020   Weight loss 05/10/2019   Stress 04/19/2019   Aortic atherosclerosis (HCC) 11/20/2018   Abnormal CT lung screening 11/07/2018   Bronchiectasis without complication (HCC) 11/07/2018   Abnormal chest CT 10/19/2018   Health care maintenance 07/06/2018   History of breast cancer 12/01/2017   Macular degeneration 12/01/2017   Hypertension 11/28/2017   Hypothyroidism 11/28/2017   Hyperlipidemia 11/28/2017   PCP:  Dale , MD Pharmacy:   [id number] - [place], [place] - [address] AT [facility] [address] [facility] [address] [place] [address] Phone: [phone] Fax: [phone]     Social Drivers of Health (SDOH) Social History: SDOH Screenings   Food Insecurity: No Food Insecurity (10/30/2023)  Housing: Low Risk  (10/30/2023)  Transportation Needs: No Transportation Needs (10/30/2023)  Utilities: Not At Risk (10/30/2023)  Depression (PHQ2-9): Low Risk  (04/04/2023)  Financial Resource Strain: Low Risk  (10/22/2022)  Physical Activity: Insufficiently Active (10/22/2022)  Social Connections: Unknown (10/30/2023)  Stress: No Stress Concern Present (10/22/2022)  Tobacco Use: Medium Risk (10/30/2023)   SDOH Interventions:     Readmission Risk Interventions    11/02/2023    1:50 PM 09/06/2023    1:20 PM  Readmission Risk Prevention Plan  Transportation Screening Complete Complete  PCP or Specialist Appt within 5-7 Days Complete Complete  Home Care Screening Complete Complete  Medication Review (RN CM) Complete Complete

## 2023-11-03 DIAGNOSIS — E039 Hypothyroidism, unspecified: Secondary | ICD-10-CM | POA: Diagnosis not present

## 2023-11-03 DIAGNOSIS — J189 Pneumonia, unspecified organism: Principal | ICD-10-CM

## 2023-11-03 DIAGNOSIS — J9621 Acute and chronic respiratory failure with hypoxia: Secondary | ICD-10-CM | POA: Diagnosis not present

## 2023-11-03 LAB — BASIC METABOLIC PANEL
Anion gap: 6 (ref 5–15)
BUN: 31 mg/dL — ABNORMAL HIGH (ref 8–23)
CO2: 39 mmol/L — ABNORMAL HIGH (ref 22–32)
Calcium: 8.8 mg/dL — ABNORMAL LOW (ref 8.9–10.3)
Chloride: 98 mmol/L (ref 98–111)
Creatinine, Ser: 1.4 mg/dL — ABNORMAL HIGH (ref 0.44–1.00)
GFR, Estimated: 35 mL/min — ABNORMAL LOW (ref >60)
Glucose, Bld: 106 mg/dL — ABNORMAL HIGH (ref 70–99)
Potassium: 5.3 mmol/L — ABNORMAL HIGH (ref 3.5–5.1)
Sodium: 143 mmol/L (ref 135–145)

## 2023-11-03 LAB — CBC
HCT: 36.2 % (ref 36.0–46.0)
Hemoglobin: 10.1 g/dL — ABNORMAL LOW (ref 12.0–15.0)
MCH: 29.3 pg (ref 26.0–34.0)
MCHC: 27.9 g/dL — ABNORMAL LOW (ref 30.0–36.0)
MCV: 104.9 fL — ABNORMAL HIGH (ref 80.0–100.0)
Platelets: 298 10*3/uL (ref 150–400)
RBC: 3.45 MIL/uL — ABNORMAL LOW (ref 3.87–5.11)
RDW: 14.9 % (ref 11.5–15.5)
WBC: 12.9 10*3/uL — ABNORMAL HIGH (ref 4.0–10.5)
nRBC: 0 % (ref 0.0–0.2)

## 2023-11-04 LAB — GLUCOSE, CAPILLARY: Glucose-Capillary: 161 mg/dL — ABNORMAL HIGH (ref 70–99)

## 2023-11-08 ENCOUNTER — Ambulatory Visit (INDEPENDENT_AMBULATORY_CARE_PROVIDER_SITE_OTHER): Payer: Medicare Other | Admitting: Audiology

## 2023-11-08 ENCOUNTER — Ambulatory Visit (INDEPENDENT_AMBULATORY_CARE_PROVIDER_SITE_OTHER): Payer: Medicare Other

## 2023-11-09 NOTE — Progress Notes (Signed)
Patient expired at 0915.  MD notified, Supervisor notified.  Family at bedside at expiration.    [place] determined patient was not a candidate to be a donor.    Son in route from CLT to see patient before taking to the morgue

## 2023-11-09 NOTE — Death Summary Note (Signed)
DEATH SUMMARY   Patient Details  Name: Alexa Pham MRN: [id number] DOB: 07-26-1930 BMW:[id number], [name], MD Admission/Discharge Information   Admit Date:  Nov 15, 2023  Date of Death: Date of Death: 11/20/23  Time of Death: Time of Death: 0915  Length of Stay: 5   Principle Cause of death: Multifocal pneumonia  Hospital Diagnoses: Principal Problem:   Acute on chronic hypoxic respiratory failure (HCC) Active Problems:   Multifocal pneumonia   Hypertension   Hypothyroidism   Hyperlipidemia   Hypomagnesemia   CAD (coronary artery disease)   Urge incontinence   Rectal prolapse   SUI (stress urinary incontinence, female)   Hypophosphatemia   Protein-calorie malnutrition, severe   Pleural effusion, bilateral   Hospital Course: Alexa Pham is a 89 year old female with history of hyperlipidemia, hypertension, CAD requiring dual antiplatelet therapy, hypothyroid, recent rectal prolapse surgery, who presents emergency department for chief concerns of shortness of breath and weakness.  Patient came from outpatient PCP clinic and per PCP note, patient had SpO2 of 85% on baseline 2 L nasal cannula.  Patient was placed on 4 L nasal cannula.    Vitals in the ED showed temperature of 98.3, respiration rate of 16, heart rate of 77, blood pressure 128/61, SpO2 of 94% on 2 L nasal cannula.  Serum sodium is 140, potassium 4.6, chloride 95, bicarb 36, BUN of 41, serum creatinine 0.88, EGFR greater than 60, nonfasting blood glucose 145, WBC 9.2, hemoglobin 10.6, platelets of 313.  COVID/influenza A/influenza B/RSV PCR were negative.  ED treatment: Azithromycin 500 mg IV one-time dose, ceftriaxone 1 g IV, sodium chloride 1 L bolus.  Assessment and Plan: * Acute on chronic hypoxic respiratory failure (HCC) Check procalcitonin on admission Continue with ceftriaxone 2 g IV daily, azithromycin 500 mg IV daily, to complete a 5-day course DuoNebs 4 times daily, 3 doses ordered on  admission Oxygen therapy, as needed to maintain SpO2 greater than 92% Continuous pulse oximetry ordered on admission  Protein-calorie malnutrition, severe Registered dietitian has been consulted Check serum magnesium, phosphorus on admission  Hypophosphatemia Will check serum phosphorus level on admission  CAD (coronary artery disease) Atorvastatin 40 mg nightly, Plavix 75 mg daily, aspirin 81 mg daily resumed  Hypomagnesemia Will check serum magnesium level on admission  Hyperlipidemia Home atorvastatin 40 mg nightly resumed  Hypothyroidism Home levothyroxine 150 mcg p.o. daily resumed on admission  Hypertension Amlodipine 2.5 mg p.o. twice daily, Coreg 6.25 mg p.o. twice daily were resumed on admission Hydralazine 5 mg IV every 6 hours as needed for SBP greater than 170, 5 days ordered      {Tip this will not be part of the note when signed Body mass index is 19.61 kg/m. ,  Nutrition Documentation    Flowsheet Row ED to Hosp-Admission (Current) from 2023-11-15 in [facility]  Nutrition Problem Severe Malnutrition  Etiology chronic illness  [CHF]  Nutrition Goal Patient will meet greater than or equal to 90% of their needs  Interventions Ensure Enlive (each supplement provides 350kcal and 20 grams of protein), MVI, Magic cup, Liberalize Diet     ,  (Optional):26781}   Procedures: ***  Consultations: ***  The results of significant diagnostics from this hospitalization (including imaging, microbiology, ancillary and laboratory) are listed below for reference.   Significant Diagnostic Studies: DG Chest 1 View Result Date: 11/02/2023 CLINICAL DATA:  Hypoxia EXAM: CHEST  1 VIEW COMPARISON:  Chest x-ray 11/01/2023 FINDINGS: Small right and moderate left pleural effusions are present. There  central pulmonary vascular congestion and cardiomegaly. Airspace opacities are seen in the right upper lobe and left lower lobe. There is no pneumothorax or  acute fracture. Left axillary surgical clips are present. IMPRESSION: 1. Small right and moderate left pleural effusions. 2. Central pulmonary vascular congestion and cardiomegaly. 3. Airspace opacities in the right upper lobe and left lower lobe may represent edema or infection. Electronically Signed   By: Darliss Cheney M.D.   On: 11/02/2023 18:59   DG Chest 1 View Result Date: 11/01/2023 CLINICAL DATA:  Hypoxia. EXAM: CHEST  1 VIEW COMPARISON:  Chest x-ray dated October 29, 2023. FINDINGS: The patient remains rotated to the left. Unchanged mild cardiomegaly. Enlarging now moderate bilateral pleural effusions. Worsening aeration at the lung bases. Unchanged opacity in the peripheral right upper lobe. No pneumothorax. No acute osseous abnormality. IMPRESSION: 1. Enlarging now moderate bilateral pleural effusions with worsening bibasilar atelectasis. 2. Unchanged opacity in the peripheral right upper lobe, suspicious for pneumonia. Electronically Signed   By: Obie Dredge M.D.   On: 11/01/2023 12:11   DG Chest 2 View Result Date: 10/29/2023 CLINICAL DATA:  Hypoxia.  Recent surgery. EXAM: CHEST - 2 VIEW COMPARISON:  Chest x-ray dated September 12, 2023. FINDINGS: The patient is rotated to the left. Unchanged mild cardiomegaly. Worsening patchy opacities throughout both lungs with more consolidative appearance at the right lung base. Unchanged small bilateral pleural effusions. No pneumothorax. No acute osseous abnormality. IMPRESSION: 1. Worsening multifocal pneumonia. 2. Unchanged small bilateral pleural effusions. Electronically Signed   By: Obie Dredge M.D.   On: 10/29/2023 13:46    Microbiology: Recent Results (from the past 240 hours)  Resp panel by RT-PCR (RSV, Flu A&B, Covid) Anterior Nasal Swab     Status: None   Collection Time: 10/29/23  1:06 PM   Specimen: Anterior Nasal Swab  Result Value Ref Range Status   SARS Coronavirus 2 by RT PCR NEGATIVE NEGATIVE Final    Comment:  (NOTE) SARS-CoV-2 target nucleic acids are NOT DETECTED.  The SARS-CoV-2 RNA is generally detectable in upper respiratory specimens during the acute phase of infection. The lowest concentration of SARS-CoV-2 viral copies this assay can detect is 138 copies/mL. A negative result does not preclude SARS-Cov-2 infection and should not be used as the sole basis for treatment or other patient management decisions. A negative result may occur with  improper specimen collection/handling, submission of specimen other than nasopharyngeal swab, presence of viral mutation(s) within the areas targeted by this assay, and inadequate number of viral copies(<138 copies/mL). A negative result must be combined with clinical observations, patient history, and epidemiological information. The expected result is Negative.  Fact Sheet for Patients:  BloggerCourse.com  Fact Sheet for Healthcare Providers:  SeriousBroker.it  This test is no t yet approved or cleared by the [place] FDA and  has been authorized for detection and/or diagnosis of SARS-CoV-2 by FDA under an Emergency Use Authorization (EUA). This EUA will remain  in effect (meaning this test can be used) for the duration of the COVID-19 declaration under Section 564(b)(1) of the Act, 21 U.S.C.section 360bbb-3(b)(1), unless the authorization is terminated  or revoked sooner.       Influenza A by PCR NEGATIVE NEGATIVE Final   Influenza B by PCR NEGATIVE NEGATIVE Final    Comment: (NOTE) The Xpert Xpress SARS-CoV-2/FLU/RSV plus assay is intended as an aid in the diagnosis of influenza from Nasopharyngeal swab specimens and should not be used as a sole basis for treatment. Nasal  washings and aspirates are unacceptable for Xpert Xpress SARS-CoV-2/FLU/RSV testing.  Fact Sheet for Patients: BloggerCourse.com  Fact Sheet for Healthcare  Providers: SeriousBroker.it  This test is not yet approved or cleared by the [place] FDA and has been authorized for detection and/or diagnosis of SARS-CoV-2 by FDA under an Emergency Use Authorization (EUA). This EUA will remain in effect (meaning this test can be used) for the duration of the COVID-19 declaration under Section 564(b)(1) of the Act, 21 U.S.C. section 360bbb-3(b)(1), unless the authorization is terminated or revoked.     Resp Syncytial Virus by PCR NEGATIVE NEGATIVE Final    Comment: (NOTE) Fact Sheet for Patients: BloggerCourse.com  Fact Sheet for Healthcare Providers: SeriousBroker.it  This test is not yet approved or cleared by the [place] FDA and has been authorized for detection and/or diagnosis of SARS-CoV-2 by FDA under an Emergency Use Authorization (EUA). This EUA will remain in effect (meaning this test can be used) for the duration of the COVID-19 declaration under Section 564(b)(1) of the Act, 21 U.S.C. section 360bbb-3(b)(1), unless the authorization is terminated or revoked.  Performed at [facility], [address] [place], [place] [address]   MRSA Next Gen by PCR, Nasal     Status: None   Collection Time: 10/31/23  6:23 AM   Specimen: Nasal Mucosa; Nasal Swab  Result Value Ref Range Status   MRSA by PCR Next Gen NOT DETECTED NOT DETECTED Final    Comment: (NOTE) The GeneXpert MRSA Assay (FDA approved for NASAL specimens only), is one component of a comprehensive MRSA colonization surveillance program. It is not intended to diagnose MRSA infection nor to guide or monitor treatment for MRSA infections. Test performance is not FDA approved in patients less than 25 years old. Performed at [facility], [address], [place], [place] [address]     Time spent: *** minutes  Signed: Marcelino Duster, MD {dischdate:[id number]}

## 2023-11-09 DEATH — deceased

## 2023-11-12 ENCOUNTER — Ambulatory Visit: Payer: Medicare Other | Admitting: Urology

## 2023-11-13 ENCOUNTER — Ambulatory Visit: Payer: Medicare Other | Admitting: Dermatology

## 2023-12-18 ENCOUNTER — Ambulatory Visit: Payer: Medicare Other | Admitting: Dermatology
